# Patient Record
Sex: Female | Born: 1940 | Race: White | Hispanic: No | Marital: Single | Smoking: Former smoker
Health system: Southern US, Community
[De-identification: ages and names within clinical notes are randomized; demographics above are authoritative.]

## PROBLEM LIST (undated history)

## (undated) DIAGNOSIS — E539 Vitamin B deficiency, unspecified: Secondary | ICD-10-CM

## (undated) DIAGNOSIS — M1990 Unspecified osteoarthritis, unspecified site: Secondary | ICD-10-CM

## (undated) DIAGNOSIS — C50919 Malignant neoplasm of unspecified site of unspecified female breast: Secondary | ICD-10-CM

## (undated) DIAGNOSIS — M545 Low back pain, unspecified: Secondary | ICD-10-CM

## (undated) DIAGNOSIS — K5790 Diverticulosis of intestine, part unspecified, without perforation or abscess without bleeding: Secondary | ICD-10-CM

## (undated) DIAGNOSIS — E785 Hyperlipidemia, unspecified: Secondary | ICD-10-CM

## (undated) DIAGNOSIS — F419 Anxiety disorder, unspecified: Secondary | ICD-10-CM

## (undated) DIAGNOSIS — R112 Nausea with vomiting, unspecified: Secondary | ICD-10-CM

## (undated) DIAGNOSIS — IMO0002 Reserved for concepts with insufficient information to code with codable children: Secondary | ICD-10-CM

## (undated) DIAGNOSIS — Z9889 Other specified postprocedural states: Secondary | ICD-10-CM

## (undated) HISTORY — DX: Malignant neoplasm of unspecified site of unspecified female breast: C50.919

## (undated) HISTORY — PX: APPENDECTOMY: SHX54

## (undated) HISTORY — DX: Hyperlipidemia, unspecified: E78.5

## (undated) HISTORY — DX: Unspecified osteoarthritis, unspecified site: M19.90

## (undated) HISTORY — DX: Reserved for concepts with insufficient information to code with codable children: IMO0002

## (undated) HISTORY — DX: Vitamin B deficiency, unspecified: E53.9

## (undated) HISTORY — PX: CERVICAL FUSION: SHX112

## (undated) HISTORY — DX: Anxiety disorder, unspecified: F41.9

## (undated) HISTORY — PX: REDUCTION MAMMAPLASTY: SUR839

## (undated) HISTORY — DX: Low back pain, unspecified: M54.50

## (undated) HISTORY — PX: TOTAL HIP ARTHROPLASTY: SHX124

## (undated) HISTORY — DX: Low back pain: M54.5

## (undated) HISTORY — PX: TONSILLECTOMY: SUR1361

## (undated) HISTORY — PX: JOINT REPLACEMENT: SHX530

## (undated) HISTORY — DX: Diverticulosis of intestine, part unspecified, without perforation or abscess without bleeding: K57.90

## (undated) HISTORY — PX: SPINE SURGERY: SHX786

---

## 1997-12-22 ENCOUNTER — Other Ambulatory Visit: Admission: RE | Admit: 1997-12-22 | Discharge: 1997-12-22 | Payer: Self-pay | Admitting: Gynecology

## 1998-04-10 ENCOUNTER — Other Ambulatory Visit: Admission: RE | Admit: 1998-04-10 | Discharge: 1998-04-10 | Payer: Self-pay | Admitting: Gynecology

## 1998-05-22 ENCOUNTER — Encounter: Payer: Self-pay | Admitting: Neurological Surgery

## 1998-05-24 ENCOUNTER — Ambulatory Visit (HOSPITAL_COMMUNITY): Admission: RE | Admit: 1998-05-24 | Discharge: 1998-05-24 | Payer: Self-pay | Admitting: Neurological Surgery

## 1998-05-24 ENCOUNTER — Encounter: Payer: Self-pay | Admitting: Neurological Surgery

## 1998-05-26 ENCOUNTER — Inpatient Hospital Stay (HOSPITAL_COMMUNITY): Admission: RE | Admit: 1998-05-26 | Discharge: 1998-05-29 | Payer: Self-pay | Admitting: Neurological Surgery

## 1998-05-26 ENCOUNTER — Encounter: Payer: Self-pay | Admitting: Neurological Surgery

## 1998-08-01 HISTORY — PX: BREAST SURGERY: SHX581

## 1998-08-07 ENCOUNTER — Other Ambulatory Visit: Admission: RE | Admit: 1998-08-07 | Discharge: 1998-08-07 | Payer: Self-pay | Admitting: *Deleted

## 1998-08-10 ENCOUNTER — Encounter: Admission: RE | Admit: 1998-08-10 | Discharge: 1998-09-24 | Payer: Self-pay | Admitting: Neurological Surgery

## 1998-09-25 ENCOUNTER — Ambulatory Visit (HOSPITAL_COMMUNITY): Admission: RE | Admit: 1998-09-25 | Discharge: 1998-09-25 | Payer: Self-pay | Admitting: *Deleted

## 1998-12-10 ENCOUNTER — Encounter: Payer: Self-pay | Admitting: Internal Medicine

## 1998-12-10 ENCOUNTER — Ambulatory Visit (HOSPITAL_COMMUNITY): Admission: RE | Admit: 1998-12-10 | Discharge: 1998-12-10 | Payer: Self-pay | Admitting: Internal Medicine

## 1999-06-04 ENCOUNTER — Encounter: Admission: RE | Admit: 1999-06-04 | Discharge: 1999-06-04 | Payer: Self-pay | Admitting: Specialist

## 1999-06-04 ENCOUNTER — Encounter: Payer: Self-pay | Admitting: Specialist

## 1999-06-07 ENCOUNTER — Ambulatory Visit (HOSPITAL_BASED_OUTPATIENT_CLINIC_OR_DEPARTMENT_OTHER): Admission: RE | Admit: 1999-06-07 | Discharge: 1999-06-08 | Payer: Self-pay | Admitting: Specialist

## 1999-10-12 ENCOUNTER — Other Ambulatory Visit: Admission: RE | Admit: 1999-10-12 | Discharge: 1999-10-12 | Payer: Self-pay | Admitting: *Deleted

## 2000-12-11 ENCOUNTER — Other Ambulatory Visit: Admission: RE | Admit: 2000-12-11 | Discharge: 2000-12-11 | Payer: Self-pay | Admitting: *Deleted

## 2001-04-12 ENCOUNTER — Ambulatory Visit (HOSPITAL_COMMUNITY): Admission: RE | Admit: 2001-04-12 | Discharge: 2001-04-12 | Payer: Self-pay | Admitting: Internal Medicine

## 2001-04-12 ENCOUNTER — Encounter: Payer: Self-pay | Admitting: Internal Medicine

## 2001-12-25 ENCOUNTER — Emergency Department (HOSPITAL_COMMUNITY): Admission: EM | Admit: 2001-12-25 | Discharge: 2001-12-26 | Payer: Self-pay | Admitting: Emergency Medicine

## 2002-01-26 ENCOUNTER — Encounter: Admission: RE | Admit: 2002-01-26 | Discharge: 2002-01-26 | Payer: Self-pay | Admitting: Internal Medicine

## 2002-01-26 ENCOUNTER — Encounter: Payer: Self-pay | Admitting: Internal Medicine

## 2002-03-26 ENCOUNTER — Other Ambulatory Visit: Admission: RE | Admit: 2002-03-26 | Discharge: 2002-03-26 | Payer: Self-pay | Admitting: *Deleted

## 2003-04-16 ENCOUNTER — Other Ambulatory Visit: Admission: RE | Admit: 2003-04-16 | Discharge: 2003-04-16 | Payer: Self-pay | Admitting: *Deleted

## 2003-08-02 HISTORY — PX: ABDOMINAL HYSTERECTOMY: SHX81

## 2004-05-20 ENCOUNTER — Encounter (INDEPENDENT_AMBULATORY_CARE_PROVIDER_SITE_OTHER): Payer: Self-pay | Admitting: *Deleted

## 2004-05-20 ENCOUNTER — Observation Stay (HOSPITAL_COMMUNITY): Admission: RE | Admit: 2004-05-20 | Discharge: 2004-05-21 | Payer: Self-pay | Admitting: *Deleted

## 2004-06-17 ENCOUNTER — Ambulatory Visit: Payer: Self-pay | Admitting: Internal Medicine

## 2004-08-04 ENCOUNTER — Ambulatory Visit: Payer: Self-pay | Admitting: Internal Medicine

## 2004-08-09 ENCOUNTER — Ambulatory Visit: Payer: Self-pay | Admitting: Internal Medicine

## 2004-11-02 ENCOUNTER — Ambulatory Visit: Payer: Self-pay | Admitting: Internal Medicine

## 2004-11-08 ENCOUNTER — Ambulatory Visit: Payer: Self-pay | Admitting: Internal Medicine

## 2005-02-07 ENCOUNTER — Ambulatory Visit: Payer: Self-pay | Admitting: Internal Medicine

## 2005-02-14 ENCOUNTER — Ambulatory Visit: Payer: Self-pay | Admitting: Internal Medicine

## 2005-04-18 ENCOUNTER — Ambulatory Visit: Payer: Self-pay | Admitting: Internal Medicine

## 2005-05-27 ENCOUNTER — Ambulatory Visit: Payer: Self-pay | Admitting: Internal Medicine

## 2005-06-02 ENCOUNTER — Encounter: Admission: RE | Admit: 2005-06-02 | Discharge: 2005-06-02 | Payer: Self-pay | Admitting: Orthopedic Surgery

## 2005-07-12 ENCOUNTER — Ambulatory Visit: Payer: Self-pay | Admitting: Internal Medicine

## 2005-08-31 ENCOUNTER — Ambulatory Visit: Payer: Self-pay | Admitting: Internal Medicine

## 2005-09-12 ENCOUNTER — Ambulatory Visit: Payer: Self-pay | Admitting: Internal Medicine

## 2005-09-21 ENCOUNTER — Ambulatory Visit: Payer: Self-pay

## 2005-09-26 ENCOUNTER — Inpatient Hospital Stay (HOSPITAL_COMMUNITY): Admission: RE | Admit: 2005-09-26 | Discharge: 2005-09-29 | Payer: Self-pay | Admitting: Orthopedic Surgery

## 2005-09-27 ENCOUNTER — Ambulatory Visit: Payer: Self-pay | Admitting: Physical Medicine & Rehabilitation

## 2005-09-29 ENCOUNTER — Ambulatory Visit: Payer: Self-pay | Admitting: Physical Medicine & Rehabilitation

## 2005-09-29 ENCOUNTER — Inpatient Hospital Stay
Admission: RE | Admit: 2005-09-29 | Discharge: 2005-10-07 | Payer: Self-pay | Admitting: Physical Medicine & Rehabilitation

## 2005-12-19 ENCOUNTER — Ambulatory Visit: Payer: Self-pay | Admitting: Internal Medicine

## 2006-02-22 ENCOUNTER — Ambulatory Visit: Payer: Self-pay | Admitting: Internal Medicine

## 2006-05-18 ENCOUNTER — Ambulatory Visit: Payer: Self-pay | Admitting: Internal Medicine

## 2006-05-18 LAB — CONVERTED CEMR LAB
Chol/HDL Ratio, serum: 5.8
Cholesterol: 237 mg/dL (ref 0–200)
Creatinine, Ser: 0.7 mg/dL (ref 0.4–1.2)
Glucose, Bld: 99 mg/dL (ref 70–99)
HDL: 41 mg/dL (ref >39.0)
LDL DIRECT: 162.1 mg/dL
Potassium: 3.9 meq/L (ref 3.5–5.1)
Sed Rate: 17 mm/hr (ref 0–25)
T4, Total: 5.3 ug/dL (ref 5.0–12.5)
Triglyceride fasting, serum: 121 mg/dL (ref 0–149)
VLDL: 24 mg/dL (ref 0–40)

## 2006-05-23 ENCOUNTER — Ambulatory Visit: Payer: Self-pay | Admitting: Internal Medicine

## 2006-07-18 ENCOUNTER — Ambulatory Visit: Payer: Self-pay | Admitting: Internal Medicine

## 2006-07-21 ENCOUNTER — Ambulatory Visit: Payer: Self-pay | Admitting: Gastroenterology

## 2006-09-04 ENCOUNTER — Ambulatory Visit: Payer: Self-pay | Admitting: Internal Medicine

## 2006-09-04 LAB — CONVERTED CEMR LAB
Bilirubin Urine: NEGATIVE
Crystals: NEGATIVE
Ketones, ur: NEGATIVE mg/dL
Mucus, UA: NEGATIVE
Nitrite: POSITIVE — AB
Specific Gravity, Urine: 1.025 (ref 1.000–1.03)
Total Protein, Urine: NEGATIVE mg/dL
Urine Glucose: NEGATIVE mg/dL
Urobilinogen, UA: 0.2 (ref 0.0–1.0)
pH: 6 (ref 5.0–8.0)

## 2006-09-22 ENCOUNTER — Ambulatory Visit: Payer: Self-pay | Admitting: Internal Medicine

## 2006-12-27 ENCOUNTER — Ambulatory Visit: Payer: Self-pay | Admitting: Internal Medicine

## 2006-12-28 ENCOUNTER — Ambulatory Visit: Payer: Self-pay | Admitting: Internal Medicine

## 2006-12-28 LAB — CONVERTED CEMR LAB
ALT: 18 units/L (ref 0–40)
AST: 18 units/L (ref 0–37)
Albumin: 4 g/dL (ref 3.5–5.2)
Alkaline Phosphatase: 80 units/L (ref 39–117)
BUN: 14 mg/dL (ref 6–23)
Bilirubin Urine: NEGATIVE
Bilirubin, Direct: 0.1 mg/dL (ref 0.0–0.3)
CA 125: 6.4 units/mL (ref 0.0–30.2)
CO2: 29 meq/L (ref 19–32)
Calcium: 9.2 mg/dL (ref 8.4–10.5)
Chloride: 105 meq/L (ref 96–112)
Creatinine, Ser: 0.7 mg/dL (ref 0.4–1.2)
GFR calc Af Amer: 107 mL/min
GFR calc non Af Amer: 89 mL/min
Glucose, Bld: 109 mg/dL — ABNORMAL HIGH (ref 70–99)
Ketones, ur: NEGATIVE mg/dL
Leukocytes, UA: NEGATIVE
Nitrite: NEGATIVE
Potassium: 3.7 meq/L (ref 3.5–5.1)
Sodium: 139 meq/L (ref 135–145)
Specific Gravity, Urine: 1.02 (ref 1.000–1.03)
Total Bilirubin: 0.7 mg/dL (ref 0.3–1.2)
Total Protein, Urine: NEGATIVE mg/dL
Total Protein: 7 g/dL (ref 6.0–8.3)
Urine Glucose: NEGATIVE mg/dL
Urobilinogen, UA: 0.2 (ref 0.0–1.0)
Vit D, 1,25-Dihydroxy: 21 (ref 20–57)
Vitamin B-12: 179 pg/mL — ABNORMAL LOW (ref 211–911)
pH: 6 (ref 5.0–8.0)

## 2007-01-02 ENCOUNTER — Other Ambulatory Visit: Admission: RE | Admit: 2007-01-02 | Discharge: 2007-01-02 | Payer: Self-pay | Admitting: Gynecology

## 2007-04-13 ENCOUNTER — Other Ambulatory Visit: Admission: RE | Admit: 2007-04-13 | Discharge: 2007-04-13 | Payer: Self-pay | Admitting: Gynecology

## 2007-04-24 ENCOUNTER — Ambulatory Visit: Payer: Self-pay | Admitting: Internal Medicine

## 2007-04-24 LAB — CONVERTED CEMR LAB
ALT: 20 units/L (ref 0–35)
AST: 17 units/L (ref 0–37)
Albumin: 3.8 g/dL (ref 3.5–5.2)
Alkaline Phosphatase: 79 units/L (ref 39–117)
BUN: 10 mg/dL (ref 6–23)
Bilirubin, Direct: 0.1 mg/dL (ref 0.0–0.3)
CO2: 32 meq/L (ref 19–32)
Calcium: 9.5 mg/dL (ref 8.4–10.5)
Chloride: 104 meq/L (ref 96–112)
Cholesterol: 226 mg/dL (ref 0–200)
Creatinine, Ser: 0.6 mg/dL (ref 0.4–1.2)
Direct LDL: 164.4 mg/dL
GFR calc Af Amer: 128 mL/min
GFR calc non Af Amer: 106 mL/min
Glucose, Bld: 102 mg/dL — ABNORMAL HIGH (ref 70–99)
HDL: 50 mg/dL (ref >39.0)
Potassium: 4.1 meq/L (ref 3.5–5.1)
Sodium: 144 meq/L (ref 135–145)
Total Bilirubin: 0.7 mg/dL (ref 0.3–1.2)
Total CHOL/HDL Ratio: 4.5
Total Protein: 7 g/dL (ref 6.0–8.3)
Triglycerides: 103 mg/dL (ref 0–149)
VLDL: 21 mg/dL (ref 0–40)
Vit D, 1,25-Dihydroxy: 17 — ABNORMAL LOW (ref 20–57)
Vitamin B-12: 337 pg/mL (ref 211–911)

## 2007-05-02 ENCOUNTER — Encounter: Payer: Self-pay | Admitting: Internal Medicine

## 2007-05-02 ENCOUNTER — Ambulatory Visit: Payer: Self-pay | Admitting: Internal Medicine

## 2007-05-02 DIAGNOSIS — F411 Generalized anxiety disorder: Secondary | ICD-10-CM | POA: Insufficient documentation

## 2007-05-02 DIAGNOSIS — G8929 Other chronic pain: Secondary | ICD-10-CM | POA: Insufficient documentation

## 2007-05-02 DIAGNOSIS — D518 Other vitamin B12 deficiency anemias: Secondary | ICD-10-CM | POA: Insufficient documentation

## 2007-05-02 DIAGNOSIS — E559 Vitamin D deficiency, unspecified: Secondary | ICD-10-CM | POA: Insufficient documentation

## 2007-05-02 DIAGNOSIS — E785 Hyperlipidemia, unspecified: Secondary | ICD-10-CM | POA: Insufficient documentation

## 2007-05-02 DIAGNOSIS — F419 Anxiety disorder, unspecified: Secondary | ICD-10-CM | POA: Insufficient documentation

## 2007-05-02 DIAGNOSIS — M545 Low back pain, unspecified: Secondary | ICD-10-CM | POA: Insufficient documentation

## 2007-05-02 DIAGNOSIS — M1990 Unspecified osteoarthritis, unspecified site: Secondary | ICD-10-CM | POA: Insufficient documentation

## 2007-05-22 ENCOUNTER — Ambulatory Visit: Payer: Self-pay | Admitting: Internal Medicine

## 2007-06-04 ENCOUNTER — Ambulatory Visit: Payer: Self-pay | Admitting: Internal Medicine

## 2007-06-06 ENCOUNTER — Telehealth: Payer: Self-pay | Admitting: Internal Medicine

## 2007-06-06 DIAGNOSIS — M542 Cervicalgia: Secondary | ICD-10-CM | POA: Insufficient documentation

## 2007-06-08 ENCOUNTER — Encounter (INDEPENDENT_AMBULATORY_CARE_PROVIDER_SITE_OTHER): Payer: Self-pay | Admitting: *Deleted

## 2007-06-25 ENCOUNTER — Telehealth: Payer: Self-pay | Admitting: Internal Medicine

## 2007-08-22 ENCOUNTER — Encounter: Payer: Self-pay | Admitting: Internal Medicine

## 2007-09-19 ENCOUNTER — Ambulatory Visit: Payer: Self-pay | Admitting: Internal Medicine

## 2007-09-19 DIAGNOSIS — J069 Acute upper respiratory infection, unspecified: Secondary | ICD-10-CM | POA: Insufficient documentation

## 2007-09-24 ENCOUNTER — Telehealth: Payer: Self-pay | Admitting: Internal Medicine

## 2007-09-25 ENCOUNTER — Ambulatory Visit: Payer: Self-pay | Admitting: Internal Medicine

## 2007-10-16 ENCOUNTER — Encounter: Admission: RE | Admit: 2007-10-16 | Discharge: 2007-11-06 | Payer: Self-pay | Admitting: Neurology

## 2007-11-23 ENCOUNTER — Encounter: Payer: Self-pay | Admitting: Internal Medicine

## 2008-01-30 ENCOUNTER — Encounter: Payer: Self-pay | Admitting: Internal Medicine

## 2008-02-22 ENCOUNTER — Telehealth: Payer: Self-pay | Admitting: Internal Medicine

## 2008-04-04 ENCOUNTER — Encounter: Payer: Self-pay | Admitting: Internal Medicine

## 2008-05-01 ENCOUNTER — Inpatient Hospital Stay (HOSPITAL_COMMUNITY): Admission: RE | Admit: 2008-05-01 | Discharge: 2008-05-02 | Payer: Self-pay | Admitting: Neurological Surgery

## 2008-05-04 ENCOUNTER — Inpatient Hospital Stay (HOSPITAL_COMMUNITY): Admission: EM | Admit: 2008-05-04 | Discharge: 2008-05-08 | Payer: Self-pay | Admitting: Emergency Medicine

## 2008-05-28 ENCOUNTER — Encounter: Payer: Self-pay | Admitting: Internal Medicine

## 2008-08-12 ENCOUNTER — Ambulatory Visit: Payer: Self-pay | Admitting: Internal Medicine

## 2008-08-12 DIAGNOSIS — R109 Unspecified abdominal pain: Secondary | ICD-10-CM | POA: Insufficient documentation

## 2008-08-15 ENCOUNTER — Ambulatory Visit: Payer: Self-pay | Admitting: Gynecology

## 2008-09-26 ENCOUNTER — Ambulatory Visit: Payer: Self-pay | Admitting: Internal Medicine

## 2008-09-28 LAB — CONVERTED CEMR LAB
BUN: 16 mg/dL (ref 6–23)
CO2: 27 meq/L (ref 19–32)
Calcium: 9.3 mg/dL (ref 8.4–10.5)
Chloride: 103 meq/L (ref 96–112)
Cholesterol: 232 mg/dL (ref 0–200)
Creatinine, Ser: 0.6 mg/dL (ref 0.4–1.2)
Direct LDL: 161.5 mg/dL
GFR calc Af Amer: 128 mL/min
GFR calc non Af Amer: 106 mL/min
Glucose, Bld: 116 mg/dL — ABNORMAL HIGH (ref 70–99)
HDL: 45.7 mg/dL (ref >39.0)
Potassium: 4.1 meq/L (ref 3.5–5.1)
Sodium: 138 meq/L (ref 135–145)
TSH: 3.33 microintl units/mL (ref 0.35–5.50)
Total CHOL/HDL Ratio: 5.1
Triglycerides: 110 mg/dL (ref 0–149)
VLDL: 22 mg/dL (ref 0–40)
Vitamin B-12: >1500 pg/mL — ABNORMAL HIGH (ref 211–911)

## 2008-09-30 LAB — CONVERTED CEMR LAB: Vit D, 25-Hydroxy: 26 ng/mL — ABNORMAL LOW (ref 30–89)

## 2008-10-08 ENCOUNTER — Ambulatory Visit: Payer: Self-pay | Admitting: Internal Medicine

## 2008-12-04 ENCOUNTER — Telehealth: Payer: Self-pay | Admitting: Internal Medicine

## 2009-01-29 ENCOUNTER — Ambulatory Visit: Payer: Self-pay | Admitting: Internal Medicine

## 2009-01-29 LAB — CONVERTED CEMR LAB
BUN: 13 mg/dL (ref 6–23)
CO2: 30 meq/L (ref 19–32)
Calcium: 9.3 mg/dL (ref 8.4–10.5)
Chloride: 104 meq/L (ref 96–112)
Creatinine, Ser: 0.6 mg/dL (ref 0.4–1.2)
GFR calc non Af Amer: 105.27 mL/min (ref >60)
Glucose, Bld: 104 mg/dL — ABNORMAL HIGH (ref 70–99)
Potassium: 4.2 meq/L (ref 3.5–5.1)
Sodium: 141 meq/L (ref 135–145)

## 2009-02-02 LAB — CONVERTED CEMR LAB: Vit D, 25-Hydroxy: 24 ng/mL — ABNORMAL LOW (ref 30–89)

## 2009-02-04 ENCOUNTER — Ambulatory Visit: Payer: Self-pay | Admitting: Internal Medicine

## 2009-04-08 ENCOUNTER — Ambulatory Visit: Payer: Self-pay | Admitting: Gynecology

## 2009-04-08 ENCOUNTER — Other Ambulatory Visit: Admission: RE | Admit: 2009-04-08 | Discharge: 2009-04-08 | Payer: Self-pay | Admitting: Gynecology

## 2009-04-08 ENCOUNTER — Encounter: Payer: Self-pay | Admitting: Gynecology

## 2009-04-09 ENCOUNTER — Ambulatory Visit: Payer: Self-pay | Admitting: Gynecology

## 2009-04-17 ENCOUNTER — Telehealth (INDEPENDENT_AMBULATORY_CARE_PROVIDER_SITE_OTHER): Payer: Self-pay | Admitting: *Deleted

## 2009-06-05 ENCOUNTER — Ambulatory Visit: Payer: Self-pay | Admitting: Internal Medicine

## 2009-06-05 LAB — CONVERTED CEMR LAB
BUN: 10 mg/dL (ref 6–23)
CO2: 29 meq/L (ref 19–32)
Calcium: 8.9 mg/dL (ref 8.4–10.5)
Chloride: 104 meq/L (ref 96–112)
Creatinine, Ser: 0.7 mg/dL (ref 0.4–1.2)
GFR calc non Af Amer: 88.03 mL/min (ref >60)
Glucose, Bld: 99 mg/dL (ref 70–99)
Potassium: 3.9 meq/L (ref 3.5–5.1)
Sodium: 141 meq/L (ref 135–145)
TSH: 3.48 microintl units/mL (ref 0.35–5.50)
Vitamin B-12: 765 pg/mL (ref 211–911)

## 2009-06-08 ENCOUNTER — Telehealth: Payer: Self-pay | Admitting: Internal Medicine

## 2009-06-08 LAB — CONVERTED CEMR LAB: Vit D, 25-Hydroxy: 15 ng/mL — ABNORMAL LOW (ref 30–89)

## 2009-06-12 ENCOUNTER — Ambulatory Visit: Payer: Self-pay | Admitting: Internal Medicine

## 2009-06-12 DIAGNOSIS — Z87891 Personal history of nicotine dependence: Secondary | ICD-10-CM | POA: Insufficient documentation

## 2009-06-30 ENCOUNTER — Ambulatory Visit: Payer: Self-pay | Admitting: Cardiovascular Disease

## 2009-06-30 ENCOUNTER — Ambulatory Visit (HOSPITAL_COMMUNITY): Admission: RE | Admit: 2009-06-30 | Discharge: 2009-06-30 | Payer: Self-pay | Admitting: Internal Medicine

## 2009-06-30 ENCOUNTER — Encounter: Payer: Self-pay | Admitting: Internal Medicine

## 2009-06-30 ENCOUNTER — Ambulatory Visit: Payer: Self-pay

## 2009-07-20 ENCOUNTER — Ambulatory Visit: Payer: Self-pay | Admitting: Internal Medicine

## 2009-07-20 DIAGNOSIS — J019 Acute sinusitis, unspecified: Secondary | ICD-10-CM | POA: Insufficient documentation

## 2009-08-26 ENCOUNTER — Inpatient Hospital Stay (HOSPITAL_COMMUNITY): Admission: RE | Admit: 2009-08-26 | Discharge: 2009-08-29 | Payer: Self-pay | Admitting: Orthopedic Surgery

## 2009-12-08 ENCOUNTER — Ambulatory Visit: Payer: Self-pay | Admitting: Internal Medicine

## 2009-12-08 DIAGNOSIS — L659 Nonscarring hair loss, unspecified: Secondary | ICD-10-CM | POA: Insufficient documentation

## 2009-12-08 LAB — CONVERTED CEMR LAB
ALT: 26 units/L (ref 0–35)
AST: 23 units/L (ref 0–37)
Albumin: 4.2 g/dL (ref 3.5–5.2)
Alkaline Phosphatase: 82 units/L (ref 39–117)
BUN: 15 mg/dL (ref 6–23)
Basophils Absolute: 0.1 10*3/uL (ref 0.0–0.1)
Basophils Relative: 0.9 % (ref 0.0–3.0)
Bilirubin Urine: NEGATIVE
Bilirubin, Direct: 0.1 mg/dL (ref 0.0–0.3)
CO2: 29 meq/L (ref 19–32)
Calcium: 9.4 mg/dL (ref 8.4–10.5)
Chloride: 104 meq/L (ref 96–112)
Creatinine, Ser: 0.5 mg/dL (ref 0.4–1.2)
Eosinophils Absolute: 0.1 10*3/uL (ref 0.0–0.7)
Eosinophils Relative: 1 % (ref 0.0–5.0)
GFR calc non Af Amer: 121.17 mL/min (ref >60)
Glucose, Bld: 93 mg/dL (ref 70–99)
HCT: 41.8 % (ref 36.0–46.0)
Hemoglobin, Urine: NEGATIVE
Hemoglobin: 14.4 g/dL (ref 12.0–15.0)
Ketones, ur: NEGATIVE mg/dL
Leukocytes, UA: NEGATIVE
Lymphocytes Relative: 34 % (ref 12.0–46.0)
Lymphs Abs: 2.8 10*3/uL (ref 0.7–4.0)
MCHC: 34.4 g/dL (ref 30.0–36.0)
MCV: 93.8 fL (ref 78.0–100.0)
Monocytes Absolute: 0.7 10*3/uL (ref 0.1–1.0)
Monocytes Relative: 8.6 % (ref 3.0–12.0)
Neutro Abs: 4.5 10*3/uL (ref 1.4–7.7)
Neutrophils Relative %: 55.5 % (ref 43.0–77.0)
Nitrite: NEGATIVE
Platelets: 328 10*3/uL (ref 150.0–400.0)
Potassium: 4.5 meq/L (ref 3.5–5.1)
RBC: 4.45 M/uL (ref 3.87–5.11)
RDW: 13.4 % (ref 11.5–14.6)
Sed Rate: 17 mm/hr (ref 0–22)
Sodium: 141 meq/L (ref 135–145)
Specific Gravity, Urine: 1.025 (ref 1.000–1.030)
TSH: 2.55 microintl units/mL (ref 0.35–5.50)
Total Bilirubin: 0.7 mg/dL (ref 0.3–1.2)
Total Protein, Urine: NEGATIVE mg/dL
Total Protein: 7.2 g/dL (ref 6.0–8.3)
Urine Glucose: NEGATIVE mg/dL
Urobilinogen, UA: 0.2 (ref 0.0–1.0)
Vitamin B-12: 1007 pg/mL — ABNORMAL HIGH (ref 211–911)
WBC: 8.2 10*3/uL (ref 4.5–10.5)
pH: 5.5 (ref 5.0–8.0)

## 2009-12-09 LAB — CONVERTED CEMR LAB: Vit D, 25-Hydroxy: 26 ng/mL — ABNORMAL LOW (ref 30–89)

## 2010-03-15 ENCOUNTER — Ambulatory Visit: Payer: Self-pay | Admitting: Internal Medicine

## 2010-03-15 DIAGNOSIS — M25559 Pain in unspecified hip: Secondary | ICD-10-CM | POA: Insufficient documentation

## 2010-03-15 DIAGNOSIS — E669 Obesity, unspecified: Secondary | ICD-10-CM | POA: Insufficient documentation

## 2010-04-09 ENCOUNTER — Encounter: Payer: Self-pay | Admitting: Internal Medicine

## 2010-04-14 ENCOUNTER — Other Ambulatory Visit: Admission: RE | Admit: 2010-04-14 | Discharge: 2010-04-14 | Payer: Self-pay | Admitting: Gynecology

## 2010-04-14 ENCOUNTER — Ambulatory Visit: Payer: Self-pay | Admitting: Gynecology

## 2010-04-19 ENCOUNTER — Encounter: Payer: Self-pay | Admitting: Internal Medicine

## 2010-06-02 ENCOUNTER — Ambulatory Visit (HOSPITAL_COMMUNITY): Admission: RE | Admit: 2010-06-02 | Discharge: 2010-06-02 | Payer: Self-pay | Admitting: Orthopedic Surgery

## 2010-06-21 ENCOUNTER — Encounter: Payer: Self-pay | Admitting: Internal Medicine

## 2010-06-21 ENCOUNTER — Ambulatory Visit: Payer: Self-pay | Admitting: Internal Medicine

## 2010-06-21 LAB — CONVERTED CEMR LAB: Vit D, 25-Hydroxy: 38 ng/mL (ref 30–89)

## 2010-06-22 LAB — CONVERTED CEMR LAB
ALT: 19 units/L (ref 0–35)
AST: 18 units/L (ref 0–37)
Albumin: 4.2 g/dL (ref 3.5–5.2)
Alkaline Phosphatase: 78 units/L (ref 39–117)
BUN: 12 mg/dL (ref 6–23)
Basophils Absolute: 0.1 10*3/uL (ref 0.0–0.1)
Basophils Relative: 1.1 % (ref 0.0–3.0)
Bilirubin Urine: NEGATIVE
Bilirubin, Direct: 0.1 mg/dL (ref 0.0–0.3)
CO2: 27 meq/L (ref 19–32)
Calcium: 9.2 mg/dL (ref 8.4–10.5)
Chloride: 103 meq/L (ref 96–112)
Cholesterol: 240 mg/dL — ABNORMAL HIGH (ref 0–200)
Creatinine, Ser: 0.6 mg/dL (ref 0.4–1.2)
Direct LDL: 163.7 mg/dL
Eosinophils Absolute: 0.1 10*3/uL (ref 0.0–0.7)
Eosinophils Relative: 1.2 % (ref 0.0–5.0)
GFR calc non Af Amer: 97.32 mL/min (ref >60)
Glucose, Bld: 99 mg/dL (ref 70–99)
HCT: 41.5 % (ref 36.0–46.0)
HDL: 53 mg/dL (ref >39.00)
Hemoglobin, Urine: NEGATIVE
Hemoglobin: 14.4 g/dL (ref 12.0–15.0)
Ketones, ur: NEGATIVE mg/dL
Leukocytes, UA: NEGATIVE
Lymphocytes Relative: 22.8 % (ref 12.0–46.0)
Lymphs Abs: 1.6 10*3/uL (ref 0.7–4.0)
MCHC: 34.9 g/dL (ref 30.0–36.0)
MCV: 96.1 fL (ref 78.0–100.0)
Monocytes Absolute: 0.6 10*3/uL (ref 0.1–1.0)
Monocytes Relative: 8.4 % (ref 3.0–12.0)
Neutro Abs: 4.6 10*3/uL (ref 1.4–7.7)
Neutrophils Relative %: 66.5 % (ref 43.0–77.0)
Nitrite: NEGATIVE
Platelets: 301 10*3/uL (ref 150.0–400.0)
Potassium: 4.2 meq/L (ref 3.5–5.1)
RBC: 4.31 M/uL (ref 3.87–5.11)
RDW: 13.5 % (ref 11.5–14.6)
Sodium: 138 meq/L (ref 135–145)
Specific Gravity, Urine: 1.025 (ref 1.000–1.030)
TSH: 4.37 microintl units/mL (ref 0.35–5.50)
Total Bilirubin: 0.5 mg/dL (ref 0.3–1.2)
Total CHOL/HDL Ratio: 5
Total Protein, Urine: NEGATIVE mg/dL
Total Protein: 7 g/dL (ref 6.0–8.3)
Triglycerides: 118 mg/dL (ref 0.0–149.0)
Urine Glucose: NEGATIVE mg/dL
Urobilinogen, UA: 0.2 (ref 0.0–1.0)
VLDL: 23.6 mg/dL (ref 0.0–40.0)
WBC: 7 10*3/uL (ref 4.5–10.5)
pH: 5.5 (ref 5.0–8.0)

## 2010-08-01 HISTORY — PX: EYE SURGERY: SHX253

## 2010-08-22 ENCOUNTER — Encounter: Payer: Self-pay | Admitting: Orthopedic Surgery

## 2010-08-23 ENCOUNTER — Encounter: Payer: Self-pay | Admitting: Internal Medicine

## 2010-08-23 ENCOUNTER — Ambulatory Visit
Admission: RE | Admit: 2010-08-23 | Discharge: 2010-08-23 | Payer: Self-pay | Source: Home / Self Care | Attending: Internal Medicine | Admitting: Internal Medicine

## 2010-08-25 ENCOUNTER — Telehealth (INDEPENDENT_AMBULATORY_CARE_PROVIDER_SITE_OTHER): Payer: Self-pay | Admitting: *Deleted

## 2010-08-25 ENCOUNTER — Telehealth: Payer: Self-pay | Admitting: Internal Medicine

## 2010-08-31 NOTE — Letter (Signed)
Summary: microdiscectomy/[facility]  microdiscectomy/[facility]   Imported By: Lester [place] 06/17/2008 09:09:37  _____________________________________________________________________  External Attachment:    Type:   Image     Comment:   External Document

## 2010-08-31 NOTE — Assessment & Plan Note (Signed)
Summary: 3 MOS F/U // # / CD   Vital Signs:  Patient profile:   70 year old female Height:      64 inches Weight:      196 pounds BMI:     33.76 Temp:     96.9 degrees F oral Pulse rate:   80 / minute Pulse rhythm:   regular Resp:     16 per minute BP sitting:   100 / 60  (left arm) Cuff size:   large  Vitals Entered By: Lanier Prude, CMA(AAMA) (March 15, 2010 1:16 PM) CC: 3 mo f/u Is Patient Diabetic? No Comments pt is not taking Celebrex.   CC:  3 mo f/u.  History of Present Illness: C/o R hip pain after a fall OOB in [place] - went to see Dr Despina Hick Teeth surgery x2 recentely - taking the antibiotic x 2 wks C/o LBP C/o stress  Current Medications (verified): 1)  Zolpidem Tartrate 10 Mg  Tabs (Zolpidem Tartrate) .... 1/2 or 1 By Mouth At [facility] Prn 2)  Vitamin B-12 1000 Mcg  Tabs (Cyanocobalamin) .Marland Kitchen.. 1 Qd 3)  Vitamin D3 1000 Unit  Tabs (Cholecalciferol) .... 2 Qd 4)  Valium 5 Mg Tabs (Diazepam) .... Once Daily As Needed 5)  Celebrex 200 Mg Caps (Celecoxib) .... As Needed 6)  Alprazolam 0.25 Mg Tabs (Alprazolam) .Marland Kitchen.. 1 By Mouth Two Times A Day As Needed Anxiety For Flights Only 7)  Fish Oil 1000 Mg Caps (Omega-3 Fatty Acids) .... Two Times A Day 8)  Premarin 1.0gr .... 2 Times A Week 9)  Glucosamine Sulfate 750 Mg Caps (Glucosamine Sulfate) .... Once Daily 10)  Aleve 220 Mg Tabs (Naproxen Sodium) .... As Needed For Pain 11)  Caltrate 600 1500 Mg Tabs (Calcium Carbonate) .Marland Kitchen.. 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  The patient denies weight loss and weight gain.    Physical Exam  General:  overweight-appearing.   Mouth:  Oral mucosa and oropharynx without lesions or exudates.  Teeth in good repair. Neck:  No deformities, masses, or tenderness noted. Lungs:  Normal respiratory effort, chest expands symmetrically. Lungs are clear to auscultation, no crackles or wheezes. Heart:  Normal rate and regular rhythm. S1 and S2 normal without gallop, murmur, click, rub or other extra sounds. Abdomen:  Bowel sounds positive,abdomen soft and non-tender without masses, organomegaly or hernias noted. Msk:  Lumbar-sacral spine is tender to palpation over paraspinal muscles and painfull with the ROM  R hip is tender Walking w/o a cane Extremities:  No clubbing, cyanosis, edema, or deformity noted with normal full range of motion of all joints.   Neurologic:  No cranial nerve deficits noted. Station and gait are normal. Plantar reflexes are down-going bilaterally. DTRs are symmetrical throughout. Sensory, motor and coordinative functions appear intact. Skin:  Intact without suspicious lesions or rashes Very mild alopecia on top of  the scalp Psych:  Oriented X3.     Impression & Recommendations:  Problem # 1:  LOW BACK PAIN (ICD-724.2) Assessment Unchanged  Her updated medication list for this problem includes:    Celebrex 200 Mg Caps (Celecoxib) .Marland Kitchen... As needed    Aleve 220 Mg Tabs (Naproxen sodium) .Marland Kitchen... As needed for pain  Problem # 2:  OSTEOARTHRITIS (ICD-715.90) Assessment: Unchanged  Her updated medication list for this problem includes:    Celebrex 200 Mg Caps (Celecoxib) .Marland Kitchen... As needed    Aleve 220 Mg Tabs (Naproxen sodium)  .Marland Kitchen... As needed for pain  Problem # 3:  HIP PAIN (ICD-719.45) Assessment: Deteriorated See "Patient Instructions".  Her updated medication list for this problem includes:    Celebrex 200 Mg Caps (Celecoxib) .Marland Kitchen... As needed    Aleve 220 Mg Tabs (Naproxen sodium) .Marland Kitchen... As needed for pain  Problem # 4:  MEMORY LOSS (ICD-780.93) Assessment: Improved Discussed  Problem # 5:  OBESITY (ICD-278.00) Assessment: Unchanged See "Patient Instructions".  Discussed: She can try Phentermine. Risks vs benefits and controversies of a long term appetite supressant use were discussed.   Problem # 6:  ALOPECIA (ICD-704.00) Assessment: Improved  S/p Derm consult  Problem # 7:  INGUINAL PAIN, RIGHT (ICD-789.09) Assessment: Unchanged See "Patient Instructions".  Her updated medication list for this problem includes:    Celebrex 200 Mg Caps (Celecoxib) .Marland Kitchen... As needed    Aleve 220 Mg Tabs (Naproxen sodium) .Marland Kitchen... As needed for pain  Problem # 8:  ANXIETY (ICD-300.00) Assessment: Unchanged  Her updated medication list for this problem includes:    Valium 5 Mg Tabs (Diazepam) .Marland Kitchen... Take bid as needed    Alprazolam 0.25 Mg Tabs (Alprazolam) .Marland Kitchen... 1 by mouth two times a day as needed anxiety for flights only  Complete Medication List: 1)  Zolpidem Tartrate 10 Mg Tabs (Zolpidem tartrate) .... 1/2 or 1 by mouth at hs prn 2)  Vitamin B-12 1000 Mcg Tabs (Cyanocobalamin) .Marland Kitchen.. 1 qd 3)  Vitamin D3 1000 Unit Tabs (Cholecalciferol) .... 2 qd 4)  Valium 5 Mg Tabs (Diazepam) .... Take bid as needed 5)  Celebrex 200 Mg Caps (Celecoxib) .... As needed 6)  Alprazolam 0.25 Mg Tabs (Alprazolam) .Marland Kitchen.. 1 by mouth two times a day as needed anxiety for flights only 7)  Fish Oil 1000 Mg Caps (Omega-3 fatty acids) .... Two times a day 8)  Premarin 1.0gr  .... 2 times a week 9)  Glucosamine Sulfate 750 Mg Caps (Glucosamine sulfate) .... Once daily 10)  Aleve 220 Mg Tabs (Naproxen sodium) .... As needed for pain 11)   Caltrate 600 1500 Mg Tabs (Calcium carbonate) .Marland Kitchen.. 1 once daily 12)  Phentermine Hcl 37.5 Mg Tabs (Phentermine hcl) .Marland Kitchen.. 1 by mouth qam  Patient Instructions: 1)  Please schedule a follow-up appointment in 3 months well w/labs. 2)  Go on Youtube ([email or web address].com) and look up "piriformis stretch", "Ileopsoas stretch", "IT band stretch" and "gluteus stretch". See the anatomy and learn the symptoms.  You can try to self-diagnose. Do the stretches - it may help!  3)  Use stretching and balance exercises that I have provided (15 min. or longer every day) 4)  Try to eat more raw plant food, fresh and dry fruit, raw almonds, leafy vegetables, whole foods and less red meat, less animal fat. Poultry and fish is better for you than pork and beef. Avoid processed foods (canned soups, hot dogs, sausage, bacon , frozen dinners). Avoid corn syrup, high fructose syrup or  aspartam  containing drinks. Honey, Agave and Stevia are better sweeteners. Make your own  dressing with olive oil, wine vinegar, lemon juce, garlic etc. for your salads. Prescriptions: PHENTERMINE HCL 37.5 MG TABS (PHENTERMINE HCL) 1 by mouth qam  #30 x 2   Entered and Authorized by:   Tresa Garter MD   Signed by:   Tresa Garter MD on 03/15/2010   Method used:   Print then Give to Patient   RxID:   [id number] VALIUM 5 MG TABS (DIAZEPAM) Take bid as needed  #60 x 6   Entered and Authorized by:   Tresa Garter MD   Signed by:   Tresa Garter MD on 03/15/2010   Method used:   Print then Give to Patient   RxID:   [id number]

## 2010-08-31 NOTE — Progress Notes (Signed)
Summary: diazepam  Phone Note Other Incoming Call back at fax   [phone] Summary of Call: refill request on diazepam--ok x 3 refills/vg Initial call taken by: Tora Perches,  June 08, 2009 1:45 PM    Prescriptions: VALIUM 5 MG TABS (DIAZEPAM) once daily as needed  #30 x 3   Entered by:   Tora Perches   Authorized by:   Tresa Garter MD   Signed by:   Tora Perches on 06/08/2009   Method used:   Telephoned to ...       [facility]  [phone]* (retail)       [address] [place], [place]  [address]       Ph: [phone] or [phone]       Fax: [phone]   RxID:   [id number]

## 2010-08-31 NOTE — Progress Notes (Signed)
Summary: req for meds  Phone Note Call from Patient Call back at Home Phone [phone]   Reason for Call: Privacy/Consent Authorization Summary of Call: Patient is requesting antibiotics for diverticulitis. She has been eating nuts over the last week and she has been having pain on the lower left of her back. She has some left over antibiotics, augumentin & flagyl that she has started but does not have enough. She is requesting a new rx. Pt is leaving to go out of town tomorrow. Initial call taken by: Lamar Sprinkles,  June 25, 2007 11:33 AM  Follow-up for Phone Call        OK Follow-up by: Tresa Garter MD,  June 25, 2007 1:22 PM  Additional Follow-up for Phone Call Additional follow up Details #1::        Pt informed, rx sent in  Additional Follow-up by: Lamar Sprinkles,  June 26, 2007 8:06 AM    New/Updated Medications: AUGMENTIN 875-125 MG TABS (AMOXICILLIN-POT CLAVULANATE) 1 by mouth tid METRONIDAZOLE 500 MG  TABS (METRONIDAZOLE) 1 by mouth tid   Prescriptions: METRONIDAZOLE 500 MG  TABS (METRONIDAZOLE) 1 by mouth tid  #21 x 0   Entered by:   Lamar Sprinkles   Authorized by:   Tresa Garter MD   Signed by:   Lamar Sprinkles on 06/26/2007   Method used:   Electronically sent to ...       [facility]  [phone]*       [address]       [place], [place]  [address]       Ph: [phone] or [phone]       Fax: [phone]   RxID:   [id number] AUGMENTIN 875-125 MG TABS (AMOXICILLIN-POT CLAVULANATE) 1 by mouth tid  #30 x 0   Entered by:   Lamar Sprinkles   Authorized by:   Tresa Garter MD   Signed by:   Lamar Sprinkles on 06/26/2007   Method used:   Electronically sent to ...       [facility]  [phone]*       [address]       [place], [place]  [address]       Ph: [phone] or [phone]       Fax: [phone]   RxID:   [id number]

## 2010-08-31 NOTE — Progress Notes (Signed)
Summary: Plot pt  Phone Note Call from Patient Call back at [facility] Phone [phone]   Summary of Call: Pt is scheduled for cortisone injection in neck. Noticed yesterday a swollen tender gland in neck. No fever or congestion, slight sore throat.  Nurse at [facility] told pt that if she was on antibiotics for 5 days it would be ok for pt to have injection tuesday. Patient is requesting rx for ammoxicillin. Had 2 ammoxicillin at home, took 1 yesterday & 1 today.  Initial call taken by: Lamar Sprinkles,  February 22, 2008 11:09 AM  Follow-up for Phone Call        OK for amoxicillin 875 mg two times a day x 4 days = #8 Follow-up by: Jacques Navy MD,  February 22, 2008 11:38 AM  Additional Follow-up for Phone Call Additional follow up Details #1::        Lf mess for pt Additional Follow-up by: Lamar Sprinkles,  February 22, 2008 11:51 AM    New/Updated Medications: AMOXICILLIN 875 MG  TABS (AMOXICILLIN) 1 two times a day x 4 days   Prescriptions: AMOXICILLIN 875 MG  TABS (AMOXICILLIN) 1 two times a day x 4 days  #8 x 0   Entered by:   Lamar Sprinkles   Authorized by:   Tresa Garter MD   Signed by:   Lamar Sprinkles on 02/22/2008   Method used:   Electronically sent to ...       [facility]  [phone]*       [address]       [place], [place]  [address]       Ph: [phone] or [phone]       Fax: [phone]   RxID:   [id number]

## 2010-08-31 NOTE — Consult Note (Signed)
Summary: Consultation Report/Neuro/[facility]  Consultation Report/Neuro/[facility]   Imported By: Lester [place] 02/19/2008 10:26:45  _____________________________________________________________________  External Attachment:    Type:   Image     Comment:   External Document

## 2010-08-31 NOTE — Assessment & Plan Note (Signed)
Summary: f/u appt / pt chose may/cd   Vital Signs:  Patient profile:   70 year old female Height:      64 inches Weight:      200 pounds BMI:     34.45 O2 Sat:      97 % on Room air Temp:     97.3 degrees F oral Pulse rate:   73 / minute Pulse rhythm:   regular BP sitting:   110 / 70  (left arm) Cuff size:   large  Vitals Entered By: Rock Nephew CMA (Dec 08, 2009 1:09 PM)  O2 Flow:  Room air CC: follow-up visit Is Patient Diabetic? No   CC:  follow-up visit.  History of Present Illness: C/o hair loss x 1 wk F/u LBP, anxiety, OA  Current Medications (verified): 1)  Zolpidem Tartrate 10 Mg  Tabs (Zolpidem Tartrate) .... 1/2 or 1 By Mouth At [facility] Prn 2)  Vitamin B-12 1000 Mcg  Tabs (Cyanocobalamin) .Marland Kitchen.. 1 Qd 3)  Vitamin D3 1000 Unit  Tabs (Cholecalciferol) .... 2 Qd 4)  Valium 5 Mg Tabs (Diazepam) .... Once Daily As Needed 5)  Celebrex 200 Mg Caps (Celecoxib) .... As Needed 6)  Alprazolam 0.25 Mg Tabs (Alprazolam) .Marland Kitchen.. 1 By Mouth Two Times A Day As Needed Anxiety For Flights Only 7)  Fish Oil 1000 Mg Caps (Omega-3 Fatty Acids) .... Two Times A Day 8)  Premarin 1.0gr .... 2 Times A Week 9)  Glucosamine Sulfate 750 Mg Caps (Glucosamine Sulfate) .... 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    Physical Exam  General:  overweight-appearing.   Ears:  External ear exam shows no  significant lesions or deformities.  Otoscopic examination reveals clear canals, tympanic membranes are intact bilaterally without bulging, retraction, inflammation or discharge. Hearing is grossly normal bilaterally. Nose:  External nasal examination shows no deformity or inflammation. Nasal mucosa are pink and moist without lesions or exudates. Mouth:  Oral mucosa and oropharynx without lesions or exudates.  Teeth in good repair. Lungs:  Normal respiratory effort, chest expands symmetrically. Lungs are clear to auscultation, no crackles or wheezes. Heart:  Normal rate and regular rhythm. S1 and S2 normal without gallop, murmur, click, rub or other extra sounds. Abdomen:  Bowel sounds positive,abdomen soft and non-tender without masses, organomegaly or hernias noted. Msk:  Lumbar-sacral spine is tender to palpation over paraspinal muscles and painfull with the ROM  R hip is better Walking w/a cane Extremities:  No clubbing, cyanosis, edema, or deformity noted with normal full range of motion of all joints.   Neurologic:  No cranial nerve deficits noted. Station and gait are normal. Plantar reflexes are down-going bilaterally. DTRs are symmetrical throughout. Sensory, motor and coordinative functions appear intact. Skin:  Intact without suspicious lesions or rashes Very mild alopecia on top of the scalp Psych:  Oriented X3.     Impression & Recommendations:  Problem # 1:  ALOPECIA (ICD-704.00) mild Assessment New  Orders: TLB-B12, Serum-Total ONLY (  01027-O53) TLB-BMP (Basic Metabolic Panel-BMET) (80048-METABOL) TLB-Hepatic/Liver Function Pnl (80076-HEPATIC) TLB-TSH (Thyroid Stimulating Hormone) (84443-TSH) TLB-Sedimentation Rate (ESR) (85652-ESR) T-Vitamin D (25-Hydroxy) (66440-34742) TLB-Udip ONLY (81003-UDIP) TLB-CBC Platelet - w/Differential (85025-CBCD)  Problem # 2:  LOW BACK PAIN (ICD-724.2) Assessment: Unchanged  Her updated medication list for this problem includes:     Celebrex 200 Mg Caps (Celecoxib) .Marland Kitchen... As needed  Orders: T-Vitamin D (25-Hydroxy) [phone]) TLB-B12, Serum-Total ONLY (33295-J88) TLB-BMP (Basic Metabolic Panel-BMET) (80048-METABOL) TLB-Hepatic/Liver Function Pnl (80076-HEPATIC) TLB-TSH (Thyroid Stimulating Hormone) (84443-TSH) TLB-Sedimentation Rate (ESR) (85652-ESR) TLB-Udip ONLY (81003-UDIP) TLB-CBC Platelet - w/Differential (85025-CBCD)  Problem # 3:  OSTEOARTHRITIS (ICD-715.90) Assessment: Unchanged  Her updated medication list for this problem includes:    Celebrex 200 Mg Caps (Celecoxib) .Marland Kitchen... As needed  Orders: T-Vitamin D (25-Hydroxy) [id number]) TLB-B12, Serum-Total ONLY (01093-A35) TLB-BMP (Basic Metabolic Panel-BMET) (80048-METABOL) TLB-Hepatic/Liver Function Pnl (80076-HEPATIC) TLB-TSH (Thyroid Stimulating Hormone) (84443-TSH) TLB-Sedimentation Rate (ESR) (85652-ESR) TLB-Udip ONLY (81003-UDIP) TLB-CBC Platelet - w/Differential (85025-CBCD)  Problem # 4:  ANEMIA, VITAMIN B12 DEFICIENCY NEC (ICD-281.1) Assessment: Unchanged  Her updated medication list for this problem includes:    Vitamin B-12 1000 Mcg Tabs (Cyanocobalamin) .Marland Kitchen... 1 qd  Problem # 5:  S/p R THR  Complete Medication List: 1)  Zolpidem Tartrate 10 Mg Tabs (Zolpidem tartrate) .... 1/2 or 1 by mouth at hs prn 2)  Vitamin B-12 1000 Mcg Tabs (Cyanocobalamin) .Marland Kitchen.. 1 qd 3)  Vitamin D3 1000 Unit Tabs (Cholecalciferol) .... 2 qd 4)  Valium 5 Mg Tabs (Diazepam) .... Once daily as needed 5)  Celebrex 200 Mg Caps (Celecoxib) .... As needed 6)  Alprazolam 0.25 Mg Tabs (Alprazolam) .Marland Kitchen.. 1 by mouth two times a day as needed anxiety for flights only 7)  Fish Oil 1000 Mg Caps (Omega-3 fatty acids) .... Two times a day 8)  Premarin 1.0gr  .... 2 times a week 9)  Glucosamine Sulfate 750 Mg Caps (Glucosamine sulfate) .... Once daily  Patient Instructions: 1)  Please schedule a follow-up appointment in 3 months. Prescriptions: VALIUM 5 MG TABS (DIAZEPAM)  once daily as needed  #30 x 3   Entered and Authorized by:   Tresa Garter MD   Signed by:   Tresa Garter MD on 12/08/2009   Method used:   Print then Give to Patient   RxID:   [id number]

## 2010-08-31 NOTE — Letter (Signed)
Summary: Generic Letter  [facility]  [address] [place], [place] [address]   Phone: [phone]  Fax: [phone]    06/08/2007  [facility] [address] [place], [place]  [address]  Dear Ms. Hutcherson,  We have scheduled an appointment for you.  At the recommendation of Dr. Posey Rea, we have scheduled you a consult with Dr. Vickey Huger on August 10, 2007 at 10:30am, their phone number is [phone].  If you wish to decline or reschedule this appointment, please call the number of the office you are being referred to listed above and let them know.  Below is the address to Dr. Oliva Bustard office: [facility] [address] [place], [place] [address]    Sincerely,  Patient Care Coordinator [facility]

## 2010-08-31 NOTE — Assessment & Plan Note (Signed)
Summary: 4 MO ROV /NWS $50   Vital Signs:  Patient profile:   70 year old female Height:      64 inches Weight:      193 pounds BMI:     33.25 Temp:     98.3 degrees F oral Pulse rate:   69 / minute BP sitting:   116 / 62  (left arm)  Vitals Entered By: Tora Perches (February 04, 2009 2:45 PM) CC: f/u Is Patient Diabetic? No   CC:  f/u.  History of Present Illness: The patient presents for a follow up of back pain, hip pain, anxiety, depression and headaches. Mobic did not work well   Current Medications (verified): 1)  Zolpidem Tartrate 10 Mg  Tabs (Zolpidem Tartrate) .... 1/2 or 1 By Mouth At [facility] Prn 2)  Vitamin B-12 1000 Mcg  Tabs (Cyanocobalamin) .Marland Kitchen.. 1 Qd 3)  Vitamin D3 1000 Unit  Tabs (Cholecalciferol) .... 2 Qd 4)  Valium 5 Mg Tabs (Diazepam) .... Once Daily As Needed 5)  Celebrex 200 Mg Caps (Celecoxib) .... As Needed 6)  Alprazolam 0.25 Mg Tabs (Alprazolam) .Marland Kitchen.. 1 By Mouth Two Times A Day As Needed Anxiety 7)  Caltrate 600 1500 Mg Tabs (Calcium Carbonate) .... Two Times A Day 8)  Fish Oil 1000 Mg Caps (Omega-3 Fatty Acids) .... Two Times A Day 9)  Glucosamine Sulfate 750 Mg Caps (Glucosamine Sulfate) .... Once Daily 10)  Vitamin D 04540 Unit  Caps (Ergocalciferol) .Marland Kitchen.. 1 By Mouth Weekly 11)  Premarin 1.0gr .... 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    Physical Exam  General:  overweight-appearing.   Nose:  External nasal examination shows no deformity or inflammation. Nasal mucosa are pink and moist without lesions or exudates. Mouth:  Oral mucosa and oropharynx without lesions or exudates.  Teeth in good repair. Neck:  Cervical spine is tender to palpation over paraspinal muscles and with the ROM  Lungs:  Normal respiratory effort, chest expands symmetrically. Lungs are clear to auscultation, no crackles or wheezes. Heart:  Normal rate and regular rhythm. S1 and S2 normal without gallop, murmur, click, rub or other extra sounds. Abdomen:  Bowel sounds positive,abdomen soft and non-tender without masses, organomegaly or hernias noted. Msk:  Lumbar-sacral spine is tender to palpation over paraspinal muscles and painfull with the ROM  B hips are stiff Extremities:  No clubbing, cyanosis, edema, or deformity noted with normal full range of motion of all joints.   Neurologic:  No cranial nerve deficits noted. Station and gait are normal. Plantar reflexes are down-going bilaterally. DTRs are symmetrical throughout. Sensory, motor and coordinative functions appear intact. Skin:  Intact without suspicious lesions or rashes Psych:  Oriented X3.     Impression & Recommendations:  Problem # 1:  VITAMIN D DEFICIENCY (ICD-268.9) Assessment Improved On prescription therapy   Problem # 2:  INGUINAL  PAIN, RIGHT (ICD-789.09) MSK Assessment: Deteriorated  Her updated medication list for this problem includes:    Celebrex 200 Mg Caps (Celecoxib) .Marland Kitchen... As needed  Problem # 3:  ANEMIA, VITAMIN B12 DEFICIENCY NEC (ICD-281.1) Assessment: Comment Only  Her updated medication list for this problem includes:    Vitamin B-12 1000 Mcg Tabs (Cyanocobalamin) .Marland Kitchen... 1 qd  Problem # 4:  LOW BACK PAIN (ICD-724.2) Assessment:  Deteriorated Start taking a yoga class  Her updated medication list for this problem includes:    Celebrex 200 Mg Caps (Celecoxib) .Marland Kitchen... As needed  Complete Medication List: 1)  Zolpidem Tartrate 10 Mg Tabs (Zolpidem tartrate) .... 1/2 or 1 by mouth at hs prn 2)  Vitamin B-12 1000 Mcg Tabs (Cyanocobalamin) .Marland Kitchen.. 1 qd 3)  Vitamin D3 1000 Unit Tabs (Cholecalciferol) .... 2 qd 4)  Valium 5 Mg Tabs (Diazepam) .... Once daily as needed 5)  Celebrex 200 Mg Caps (Celecoxib) .... As needed 6)  Alprazolam 0.25 Mg Tabs (Alprazolam) .Marland Kitchen.. 1 by mouth two times a day as needed anxiety for flights only 7)  Caltrate 600 1500 Mg Tabs (Calcium carbonate) .... Two times a day 8)  Fish Oil 1000 Mg Caps (Omega-3 fatty acids) .... Two times a day 9)  Glucosamine Sulfate 750 Mg Caps (Glucosamine sulfate) .... Once daily 10)  Vitamin D 16109 Unit Caps (Ergocalciferol) .Marland Kitchen.. 1 by mouth weekly 11)  Premarin 1.0gr  .... 2 times a week  Patient Instructions: 1)  Try to eat more raw plant food, fresh and dry fruit, raw almonds, leafy vegies, whole foods and less meats, less animal fat. Avoid processed foods (canned soups, hot dogs, sausage , frozen dinners). Avoid animal fat, red meats.   2)  Start taking a yoga class 3)  [email or web address] 4)  [email or web address] 5)  Please schedule a follow-up appointment in 4 months. 6)  BMP prior to visit, ICD-9: 7)  Vit B12 266.2 8)  Vit D 268.9 9)  TSH prior to visit, ICD-9:

## 2010-08-31 NOTE — Assessment & Plan Note (Signed)
Summary: 2 MO ROV /NWS $50   Vital Signs:  Patient profile:   70 year old female Weight:      195 pounds Temp:     97.2 degrees F oral Pulse rate:   84 / minute BP sitting:   120 / 80  (left arm)  Vitals Entered By: Tora Perches (October 08, 2008 2:39 PM)  Is Patient Diabetic? No   History of Present Illness: C/o forgetting words The patient presents for a follow up of back pain, anxiety, insomnia and headaches.   Problems Prior to Update: 1)  Memory Loss  (ICD-780.93) 2)  Inguinal Pain, Right  (ICD-789.09) 3)  Upper Respiratory Infection (URI)  (ICD-465.9) 4)  Neck Pain  (ICD-723.1) 5)  Anemia, Vitamin B12 Deficiency Nec  (ICD-281.1) 6)  Vitamin D Deficiency  (ICD-268.9) 7)  Osteoarthritis  (ICD-715.90) 8)  Low Back Pain  (ICD-724.2) 9)  Hyperlipidemia  (ICD-272.4) 10)  Anxiety  (ICD-300.00) 11)  Family History of Cad Female 1st Degree Relative <60  (ICD-V16.49)  Medications Prior to Update: 1)  Zolpidem Tartrate 10 Mg  Tabs (Zolpidem Tartrate) .... 1/2 or 1 By Mouth At [facility] Prn 2)  Vitamin B-12 1000 Mcg  Tabs (Cyanocobalamin) .Marland Kitchen.. 1 Qd 3)  Vitamin D3 1000 Unit  Tabs (Cholecalciferol) .... 2 Qd 4)  Valium 5 Mg Tabs (Diazepam) .... Once Daily As Needed 5)  Celebrex 200 Mg Caps (Celecoxib) .... As Needed 6)  Alprazolam 0.25 Mg Tabs (Alprazolam) .Marland Kitchen.. 1 By Mouth Two Times A Day As Needed Anxiety 7)  Tamiflu 75 Mg Caps (Oseltamivir Phosphate) .... Take One Capsule By Mouth Twice A Day  Current Medications (verified): 1)  Zolpidem Tartrate 10 Mg  Tabs (Zolpidem Tartrate) .... 1/2 or 1 By Mouth At [facility] Prn 2)  Vitamin B-12 1000 Mcg  Tabs (Cyanocobalamin) .... 2 Qd 3)  Vitamin D3 1000 Unit  Tabs (Cholecalciferol) .... 2 Qd 4)  Valium 5 Mg Tabs (Diazepam) .... Once Daily As Needed 5)  Celebrex 200 Mg Caps (Celecoxib) .... As Needed 6)  Alprazolam 0.25 Mg Tabs (Alprazolam) .Marland Kitchen.. 1 By Mouth Two Times A Day As Needed Anxiety 7)  Caltrate 600 1500 Mg Tabs (Calcium Carbonate) .... Two  Times A Day 8)  Fish Oil 1000 Mg Caps (Omega-3 Fatty Acids) .... Two Times A Day 9)  Vitamin C 1000 Mg Tabs (Ascorbic Acid) .... Once Daily 10)  Glucosamine Sulfate 750 Mg Caps (Glucosamine Sulfate) .... 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 Hysterectomy Cervical fusion 99 and 2009 C3-4  Elsner  (08/12/2008)  Family History: polymyositis Family History of CAD Female 1st degree relative <60 Family History Hypertension  (05/02/2007)  Social History: Retired Single Former Smoker Regular exercise-no  (05/02/2007)  Risk Factors: Alcohol Use: N/A >5 drinks/d w/in last 3 months: N/A Caffeine Use: N/A Diet: N/A Exercise: no (05/02/2007)  Risk Factors: Smoking Status: quit (05/02/2007) Packs/Day: N/A Cigars/wk: N/A Pipe Use/wk: N/A Cans of tobacco/wk: N/A Passive Smoke Exposure: N/A  Review of Systems  The patient denies anorexia, fever, chest pain, and melena.    Physical Exam  General:  overweight-appearing.   Nose:  External nasal examination shows no deformity or inflammation. Nasal mucosa are pink and moist without lesions or exudates. Mouth:  Oral  mucosa and oropharynx without lesions or exudates.  Teeth in good repair. Neck:  Cervical spine is tender to palpation over paraspinal muscles and with the ROM  Lungs:  Normal respiratory effort, chest expands symmetrically. Lungs are clear to auscultation, no crackles or wheezes. Heart:  Normal rate and regular rhythm. S1 and S2 normal without gallop, murmur, click, rub or other extra sounds. Abdomen:  Bowel sounds positive,abdomen soft and non-tender without masses, organomegaly or hernias noted. Msk:  Lumbar-sacral spine is  tender to palpation over paraspinal muscles and painfull with the ROM  Neurologic:  No cranial nerve deficits noted. Station and gait are normal. Plantar reflexes are down-going bilaterally. DTRs are symmetrical throughout. Sensory, motor and coordinative functions appear intact. Skin:  Intact without suspicious lesions or rashes Psych:  Oriented X3.     Impression & Recommendations:  Problem # 1:  MEMORY LOSS (ICD-780.93) Assessment New Cut back on Benzodiazepines. Will w/u further if not better  Problem # 2:  NECK PAIN (ICD-723.1) Assessment: Comment Only  Her updated medication list for this problem includes:    Celebrex 200 Mg Caps (Celecoxib) .Marland Kitchen... As needed  Problem # 3:  VITAMIN D DEFICIENCY (ICD-268.9) Assessment: Comment Only Start Vit D2 50 000 iu weekly for 6 weeks; when finished, start Vit D3 1000 iu daily.  Problem # 4:  ANEMIA, VITAMIN B12 DEFICIENCY NEC (ICD-281.1) Assessment: Improved  Her updated medication list for this problem includes:    Vitamin B-12 1000 Mcg Tabs (Cyanocobalamin) .Marland Kitchen... 1 qd  Complete Medication List: 1)  Zolpidem Tartrate 10 Mg Tabs (Zolpidem tartrate) .... 1/2 or 1 by mouth at hs prn 2)  Vitamin B-12 1000 Mcg Tabs (Cyanocobalamin) .Marland Kitchen.. 1 qd 3)  Vitamin D3 1000 Unit Tabs (Cholecalciferol) .... 2 qd 4)  Valium 5 Mg Tabs (Diazepam) .... Once daily as needed 5)  Celebrex 200 Mg Caps (Celecoxib) .... As needed 6)  Alprazolam 0.25 Mg Tabs (Alprazolam) .Marland Kitchen.. 1 by mouth two times a day as needed anxiety 7)  Caltrate 600 1500 Mg Tabs (Calcium carbonate) .... Two times a day 8)  Fish Oil 1000 Mg Caps (Omega-3 fatty acids) .... Two times a day 9)  Glucosamine Sulfate 750 Mg Caps (Glucosamine sulfate) .... Once daily 10)  Vitamin D 09811 Unit Caps (Ergocalciferol) .Marland Kitchen.. 1 by mouth weekly  Patient Instructions: 1)  Please schedule a follow-up appointment in 4 months. 2)  The medication list was reviewed and reconciled.  All changed / newly prescribed  medications were explained.  A complete medication list was provided to the patient / caregiver.  3)  BMP prior to visit, ICD-9: 4)  VitD 268.9 Prescriptions: VITAMIN D 91478 UNIT  CAPS (ERGOCALCIFEROL) 1 by mouth weekly  #6 x 0   Entered and Authorized by:   Tresa Garter MD   Signed by:   Tresa Garter MD on 10/08/2008   Method used:   Electronically to        [facility]  [phone]* (retail)       [address] [place], [place]  [address]       Ph: [phone] or [phone]       Fax: [phone]   RxID:   [id number]

## 2010-08-31 NOTE — Progress Notes (Signed)
Summary: Req for referral  Phone Note Call from Patient Call back at Home Phone [phone]   Summary of Call: Pt had neck surgery 8 years ago. Pt is complaining of tingling & numbness in arm exactly like before surgery 8 years ago. Patient is requesting referral to neurologist who did her surgery. Initial call taken by: Lamar Sprinkles,  June 06, 2007 12:44 PM  Follow-up for Phone Call        Who was a Neurosurgeon? Follow-up by: Tresa Garter MD,  June 06, 2007 4:58 PM  Additional Follow-up for Phone Call Additional follow up Details #1::        [facility] on [facility], & Neuro surgeon Barnett Abu. Pt must see neurologist before surgeon. Additional Follow-up by: Lamar Sprinkles,  June 07, 2007 9:04 AM  New Problems: NECK PAIN (ICD-723.1)   Additional Follow-up for Phone Call Additional follow up Details #2::    Please inform her. She can see a neurologist or me prior  Follow-up by: Tresa Garter MD,  June 07, 2007 10:02 AM  Additional Follow-up for Phone Call Additional follow up Details #3:: Details for Additional Follow-up Action Taken: You must do an order for referral for pt to see neurologist Additional Follow-up by: Lamar Sprinkles,  June 07, 2007 10:51 AM  New Problems: NECK PAIN (ICD-723.1)

## 2010-08-31 NOTE — Consult Note (Signed)
Summary: [facility]  [facility]   Imported By: Esmeralda Links D'jimraou 05/05/2008 14:57:39  _____________________________________________________________________  External Attachment:    Type:   Image     Comment:   External Document

## 2010-08-31 NOTE — Assessment & Plan Note (Signed)
Summary: CPX-LB   Vital Signs:  Patient profile:   70 year old female Height:      64 inches Weight:      189 pounds BMI:     32.56 Temp:     98.2 degrees F oral Pulse rate:   96 / minute Pulse rhythm:   regular Resp:     16 per minute BP sitting:   138 / 88  (left arm) Cuff size:   regular  Vitals Entered By: Lanier Prude, CMA(AAMA) (June 21, 2010 8:20 AM) CC: CPX Is Patient Diabetic? No   CC:  CPX.  History of Present Illness: The patient presents for a preventive health examination  Patient past medical history, social history, and family history reviewed in detail no significant changes.  Patient is physically active. Depression is negative and mood is good. Hearing is normal, and able to perform activities of daily living. Risk of falling is low and home safety has been reviewed and is appropriate. Patient has normal height, she is overweight, and visual acuity is ok. Patient has been counseled on age-appropriate routine health concerns for screening and prevention. Education, counseling done.  The patient presents for a follow up of R hip/back pain, anxiety, insomnia and headaches.   Preventive Screening-Counseling & Management  Alcohol-Tobacco     Alcohol drinks/day: <1     Smoking Status: quit > 6 months  Caffeine-Diet-Exercise     Caffeine Counseling: not indicated; caffeine use is not excessive or problematic     Diet Counseling: not indicated; diet is assessed to be healthy     Does Patient Exercise: no     Exercise Counseling: to improve exercise regimen     Depression Counseling: not indicated; screening negative for depression  Hep-HIV-STD-Contraception     Hepatitis Risk: no risk noted     Sun Exposure-Excessive: no  Safety-Violence-Falls     Seat Belt Use: yes     Fall Risk Counseling: counseling provided; falls with injury noted  Current Medications (verified): 1)  Zolpidem Tartrate 10 Mg  Tabs (Zolpidem Tartrate) .... 1/2 or 1 By Mouth At [facility]  Prn 2)  Vitamin B-12 1000 Mcg  Tabs (Cyanocobalamin) .Marland Kitchen.. 1 Qd 3)  Vitamin D3 1000 Unit  Tabs (Cholecalciferol) .... 2 Qd 4)  Valium 5 Mg Tabs (Diazepam) .... Take Bid As Needed 5)  Celebrex 200 Mg Caps (Celecoxib) .... As Needed 6)  Alprazolam 0.25 Mg Tabs (Alprazolam) .Marland Kitchen.. 1 By Mouth Two Times A Day As Needed Anxiety For Flights Only 7)  Fish Oil 1000 Mg Caps (Omega-3 Fatty Acids) .... Two Times A Day 8)  Premarin 1.0gr .... 2 Times A Week 9)  Glucosamine Sulfate 750 Mg Caps (Glucosamine Sulfate) .... Once Daily 10)  Aleve 220 Mg Tabs (Naproxen Sodium) .... As Needed For Pain 11)  Caltrate 600 1500 Mg Tabs (Calcium Carbonate) .Marland Kitchen.. 1 Once Daily 12)  Phentermine Hcl 37.5 Mg Tabs (Phentermine Hcl) .Marland Kitchen.. 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  The patient denies anorexia, fever, weight loss, weight gain, vision loss, decreased hearing, hoarseness, chest pain, syncope, dyspnea on exertion, peripheral edema, prolonged cough, headaches, hemoptysis, abdominal pain, melena, hematochezia, severe indigestion/heartburn, hematuria, incontinence, genital sores, muscle  weakness, suspicious skin lesions, transient blindness, depression, unusual weight change, abnormal bleeding, enlarged lymph nodes, angioedema, and breast masses.    Physical Exam  General:  overweight-appearing.   Head:  Normocephalic and atraumatic without obvious abnormalities. No apparent alopecia or balding. Eyes:  No corneal or conjunctival inflammation noted. EOMI. Perrla.  Ears:  External ear exam shows no significant lesions or deformities.  Otoscopic examination reveals clear canals, tympanic membranes are intact bilaterally without bulging, retraction, inflammation or discharge. Hearing is grossly normal bilaterally. Nose:  External nasal examination shows no deformity or inflammation. Nasal mucosa are pink and moist without lesions or exudates. Mouth:  Oral mucosa and oropharynx without lesions or exudates.  Teeth in good repair. Neck:  No deformities, masses, or tenderness noted. Lungs:  Normal respiratory effort, chest expands symmetrically. Lungs are clear to auscultation, no crackles or wheezes. Heart:  Normal rate and regular rhythm. S1 and S2 normal without gallop, murmur, click, rub or other extra sounds. Abdomen:  Bowel sounds positive,abdomen soft and non-tender without masses, organomegaly or hernias noted. Msk:  Lumbar-sacral spine is tender to palpation over paraspinal muscles and painfull with the ROM  R hip is tender Walking w/o a cane Pulses:  R and L carotid,radial,femoral,dorsalis pedis and posterior tibial pulses are full and equal bilaterally Extremities:  No clubbing, cyanosis, edema, or deformity noted with normal full range of motion of all joints.   Neurologic:  No cranial nerve deficits noted. Station and gait are normal. Plantar reflexes are down-going bilaterally. DTRs are symmetrical throughout. Sensory, motor and coordinative functions appear intact. Skin:  Intact without suspicious lesions or rashes Very mild alopecia on top of the scalp Cervical Nodes:   No lymphadenopathy noted Psych:  Oriented X3.     Impression & Recommendations:  Problem # 1:  ROUTINE GENERAL MEDICAL EXAM@HEALTH  CARE FACL (ICD-V70.0) Assessment New Overall doing well, age appropriate education and counseling updated and referral for appropriate preventive services done unless declined, immunizations up to date or declined, diet counseling done if overweight, urged to quit smoking if smokes, most recent labs reviewed and current ordered if appropriate, ecg reviewed or declined (interpretation per ECG scanned in the EMR if done); information regarding Medicare Preventation requirements given if appropriate.  Orders: EKG w/ Interpretation (93000) TLB-BMP (Basic Metabolic Panel-BMET) (80048-METABOL) TLB-CBC Platelet - w/Differential (85025-CBCD) TLB-Hepatic/Liver Function Pnl (80076-HEPATIC) TLB-Lipid Panel (80061-LIPID) TLB-TSH (Thyroid Stimulating Hormone) (84443-TSH) TLB-Udip ONLY (81003-UDIP) Medicare -1st Annual Wellness Visit [phone]) I have personally reviewed the Medicare Annual Wellness questionnaire and have noted 1.   The patient's medical and social history 2.   Their use of alcohol, tobacco or illicit drugs 3.   Their current medications and supplements 4.   The patient's functional ability including ADL's, fall risks, home safety risks and hearing or visual             impairment. 5.   Diet and physical activities 6.   Evidence for depression or mood disorders  The patients weight, height, BMI and visual acuity have been  recorded in the chart I have made referrals, counseling and provided education to the patient based review of the above and I have provided the pt with a written personalized care plan for preventive services.  Complete Medication List: 1)  Zolpidem Tartrate 10 Mg Tabs (Zolpidem tartrate) .... 1/2 or 1 by mouth at hs prn 2)  Vitamin B-12 1000 Mcg Tabs (Cyanocobalamin) .Marland Kitchen.. 1 qd 3)  Vitamin D3 1000 Unit Tabs (Cholecalciferol) .... 2 qd 4)   Valium 5 Mg Tabs (Diazepam) .... Take bid as needed 5)  Celebrex 200 Mg Caps (Celecoxib) .... As needed 6)  Alprazolam 0.25 Mg Tabs (Alprazolam) .Marland Kitchen.. 1 by mouth two times a day as needed anxiety for flights only 7)  Fish Oil 1000 Mg Caps (Omega-3 fatty acids) .... Two times a day 8)  Premarin 1.0gr  .... 2 times a week 9)  Glucosamine Sulfate 750 Mg Caps (Glucosamine sulfate) .... Once daily 10)  Aleve 220 Mg Tabs (Naproxen sodium) .... As needed for pain 11)  Caltrate 600 1500 Mg Tabs (Calcium carbonate) .Marland Kitchen.. 1 once daily 12)  Phentermine Hcl 37.5 Mg Tabs (Phentermine hcl) .Marland Kitchen.. 1 by mouth qam  Other Orders: T-Vitamin D (25-Hydroxy) (16109-60454)  Patient Instructions: 1)  Please schedule a follow-up appointment in 6 months. 2)  Start taking a chair yoga class  Prescriptions: ZOLPIDEM TARTRATE 10 MG  TABS (ZOLPIDEM TARTRATE) 1/2 or 1 by mouth at hs prn  #30 x 5   Entered and Authorized by:   Tresa Garter MD   Signed by:   Tresa Garter MD on 06/21/2010   Method used:   Print then Give to Patient   RxID:   [id number]    Orders Added: 1)  EKG w/ Interpretation [93000] 2)  T-Vitamin D (25-Hydroxy) [30865-78469] 3)  TLB-BMP (Basic Metabolic Panel-BMET) [80048-METABOL] 4)  TLB-CBC Platelet - w/Differential [85025-CBCD] 5)  TLB-Hepatic/Liver Function Pnl [80076-HEPATIC] 6)  TLB-Lipid Panel [80061-LIPID] 7)  TLB-TSH (Thyroid Stimulating Hormone) [84443-TSH] 8)  TLB-Udip ONLY [81003-UDIP] 9)  Medicare -1st Annual Wellness Visit [G0438]   Immunization History:  Pneumovax Immunization History:    Pneumovax:  historical (06/04/2008)   Immunization History:  Pneumovax Immunization History:    Pneumovax:  Historical (06/04/2008)  Contraindications/Deferment of Procedures/Staging:    Test/Procedure: DPT vaccine    Reason for deferment: declined

## 2010-08-31 NOTE — Assessment & Plan Note (Signed)
   Vital Signs:  Patient Profile:   70 Years Old Female Weight:      183 pounds Pulse rate:   69 / minute BP sitting:   136 / 77                 Chief Complaint:  Multiple medical problems or concerns.  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 Back care instructions given. To be seen in 2 weeks if no improvement; sooner if worsening of symptoms.   Problem # 2:  ANEMIA, VITAMIN B12 DEFICIENCY NEC (ICD-281.1) Assessment: Unchanged  Her updated medication list for this problem includes:    Vitamin B-12 1000 Mcg Tabs (Cyanocobalamin) .Marland Kitchen... 1 qd   Problem # 3:  VITAMIN D DEFICIENCY (ICD-268.9) Assessment: Unchanged  Problem # 4:  ANXIETY (ICD-300.00) Assessment: Unchanged  Complete Medication List: 1)  Zolpidem Tartrate 10 Mg Tabs (Zolpidem tartrate) .... 1/2 or 1 by mouth at hs prn 2)  Vitamin B-12 1000 Mcg Tabs (Cyanocobalamin) .Marland Kitchen.. 1 qd 3)  Vitamin D3 1000 Unit Tabs (Cholecalciferol) .Marland Kitchen.. 1 qd   Patient Instructions: 1)  Please schedule a follow-up appointment in 6 months.    ]

## 2010-08-31 NOTE — Miscellaneous (Signed)
Summary: zolpidem  Clinical Lists Changes  Medications: Rx of ZOLPIDEM TARTRATE 10 MG  TABS (ZOLPIDEM TARTRATE) 1/2 or 1 by mouth at hs prn;  #30 x 6;  Signed;  Entered by: Tora Perches;  Authorized by: Tresa Garter MD;  Method used: [facility]  [id number]*, [address] [place], [place]  [address], Ph: [phone] or [phone], Fax: [phone]    Prescriptions: ZOLPIDEM TARTRATE 10 MG  TABS (ZOLPIDEM TARTRATE) 1/2 or 1 by mouth at hs prn  #30 x 6   Entered by:   Tora Perches   Authorized by:   Tresa Garter MD   Signed by:   Tora Perches on 11/23/2007   Method used:   Telephoned to ...       [facility]  [phone]*       [address]       [place], [place]  [address]       Ph: [phone] or [phone]       Fax: [phone]   RxID:   [id number]

## 2010-08-31 NOTE — Progress Notes (Signed)
Summary: diazepam  Phone Note Other Incoming Call back at fax   [facility] [phone] Details for Reason: request refill on diazepam Details of Action Taken: ok x 5 refills/vg  will fax to pharmacy/vg Initial call taken by: Tora Perches,  Dec 04, 2008 1:27 PM      Prescriptions: VALIUM 5 MG TABS (DIAZEPAM) once daily as needed  #30 x 5   Entered by:   Tora Perches   Authorized by:   Tresa Garter MD   Signed by:   Tora Perches on 12/04/2008   Method used:   Telephoned to ...       [facility]  [phone]* (retail)       [address] [place], [place]  [address]       Ph: [phone] or [phone]       Fax: [phone]   RxID:   [id number]

## 2010-08-31 NOTE — Consult Note (Signed)
Summary: [facility]  [facility]   Imported By: Esmeralda Links D'jimraou 09/07/2007 09:36:59  _____________________________________________________________________  External Attachment:    Type:   Image     Comment:   External Document

## 2010-08-31 NOTE — Progress Notes (Signed)
Summary: Records request from [facility]  A request for patients recent Colonoscopy was received in the morning mail.

## 2010-08-31 NOTE — Assessment & Plan Note (Signed)
Summary: 4 MO ROV/NWS $50-PER PT MEDICAL CLEARANCE-ORTHO SURGERY-STC   Vital Signs:  Patient profile:   70 year old female Weight:      189 pounds Temp:     98.6 degrees F oral Pulse rate:   84 / minute BP sitting:   124 / 82  (left arm)  Vitals Entered By: Tora Perches (June 12, 2009 8:22 AM) CC: f/u Is Patient Diabetic? No   CC:  f/u.  History of Present Illness: Req Dr Despina Hick Reason R THR in Jan2011 Hx: The patient presents for a follow up of hypertension, OA, hyperlipidemia - all stable  Preventive Screening-Counseling & Management  Alcohol-Tobacco     Smoking Status: quit  Current Medications (verified): 1)  Zolpidem Tartrate 10 Mg  Tabs (Zolpidem Tartrate) .... 1/2 or 1 By Mouth At [facility] Prn 2)  Vitamin B-12 1000 Mcg  Tabs (Cyanocobalamin) .Marland Kitchen.. 1 Qd 3)  Vitamin D3 1000 Unit  Tabs (Cholecalciferol) .... 2 Qd 4)  Valium 5 Mg Tabs (Diazepam) .... Once Daily As Needed 5)  Celebrex 200 Mg Caps (Celecoxib) .... As Needed 6)  Alprazolam 0.25 Mg Tabs (Alprazolam) .Marland Kitchen.. 1 By Mouth Two Times A Day As Needed Anxiety For Flights Only 7)  Caltrate 600 1500 Mg Tabs (Calcium Carbonate) .... Two Times A Day 8)  Fish Oil 1000 Mg Caps (Omega-3 Fatty Acids) .... Two Times A Day 9)  Glucosamine Sulfate 750 Mg Caps (Glucosamine Sulfate) .... Once Daily 10)  Vitamin D 16109 Unit  Caps (Ergocalciferol) .Marland Kitchen.. 1 By Mouth Weekly 11)  Premarin 1.0gr .... 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  The patient denies anorexia, fever, weight loss, weight gain, vision loss, decreased hearing, hoarseness, chest pain, syncope, dyspnea on exertion, peripheral edema, prolonged cough, headaches, hemoptysis, abdominal pain, melena, hematochezia, severe indigestion/heartburn, hematuria, incontinence, genital sores, muscle weakness, suspicious skin lesions, transient blindness, depression, unusual weight change, abnormal bleeding, enlarged lymph nodes, angioedema, and breast masses.    Physical Exam  General:  overweight-appearing.   Head:  Normocephalic and atraumatic without obvious abnormalities. No apparent alopecia or balding. Eyes:  No corneal or conjunctival inflammation noted. EOMI. Perrla.  Ears:  External ear exam shows no significant lesions or deformities.  Otoscopic examination reveals clear canals, tympanic membranes are intact bilaterally without bulging, retraction, inflammation or discharge. Hearing is grossly normal bilaterally. Nose:  External nasal examination shows no deformity or inflammation. Nasal mucosa are pink and moist without lesions or exudates. Mouth:  Oral mucosa and oropharynx without lesions or exudates.  Teeth in good repair. Neck:  No deformities, masses, or tenderness noted. Lungs:  Normal respiratory effort, chest expands symmetrically. Lungs are clear to auscultation, no crackles or wheezes. Heart:  Normal rate and regular rhythm. S1 and S2 normal without gallop, murmur, click, rub or other extra sounds. Abdomen:  Bowel sounds positive,abdomen soft and non-tender without masses, organomegaly or hernias  noted. Msk:  Lumbar-sacral spine is tender to palpation over paraspinal muscles and painfull with the ROM  R hips are stiff Pulses:  R and L  carotid,radial,femoral,dorsalis pedis and posterior tibial pulses are full and equal bilaterally Extremities:  No clubbing, cyanosis, edema, or deformity noted with normal full range of motion of all joints.   Neurologic:  No cranial nerve deficits noted. Station and gait are normal. Plantar reflexes are down-going bilaterally. DTRs are symmetrical throughout. Sensory, motor and coordinative functions appear intact. Skin:  Intact without suspicious lesions or rashes Cervical Nodes:  No lymphadenopathy noted Psych:  Oriented X3.     Impression & Recommendations:  Problem # 1:  PREOPERATIVE EXAMINATION (ICD-V72.84) Assessment Comment Only She should be clear for surgery assuming her pre-op tests are acceptable. Thank you! Orders: EKG w/ Interpretation (93000) WNL Echo Referral (Echo) ordered  Problem # 2:  OSTEOARTHRITIS (ICD-715.90) Assessment: Comment Only  Her updated medication list for this problem includes:    Celebrex 200 Mg Caps (Celecoxib) .Marland Kitchen... As needed  Problem # 3:  VITAMIN D DEFICIENCY (ICD-268.9) Assessment: Comment Only On prescription therapy   Problem # 4:  HYPERLIPIDEMIA (ICD-272.4) Assessment: Comment Only  Complete Medication List: 1)  Zolpidem Tartrate 10 Mg Tabs (Zolpidem tartrate) .... 1/2 or 1 by mouth at hs prn 2)  Vitamin B-12 1000 Mcg Tabs (Cyanocobalamin) .Marland Kitchen.. 1 qd 3)  Vitamin D3 1000 Unit Tabs (Cholecalciferol) .... 2 qd 4)  Valium 5 Mg Tabs (Diazepam) .... Once daily as needed 5)  Celebrex 200 Mg Caps (Celecoxib) .... As needed 6)  Alprazolam 0.25 Mg Tabs (Alprazolam) .Marland Kitchen.. 1 by mouth two times a day as needed anxiety for flights only 7)  Fish Oil 1000 Mg Caps (Omega-3 fatty acids) .... Two times a day 8)  Premarin 1.0gr  .... 2 times a week 9)  Vitamin D (ergocalciferol) 50000 Unit Caps (Ergocalciferol) .Marland Kitchen.. 1 by mouth q 1 week 10)  Glucosamine Sulfate 750 Mg Caps (Glucosamine sulfate) .... Once daily  Patient Instructions: 1)  Try to eat more  raw plant food, fresh and dry fruit, raw almonds, leafy vegetables, whole foods and less red meat, less animal fat. Poultry and fish is better for you than pork and beef. Avoid processed foods (canned soups, hot dogs, sausage, bacon , frozen dinners). Avoid corn syrup, high fructose syrup or aspartam and Splenda  containing drinks. Honey, Agave and Stevia are better sweeteners. Make your own  dressing with olive oil, wine vinegar, lemon juce, garlic etc. for your salads. 2)  Kefir 3)  Please schedule a follow-up appointment in 3 months. Prescriptions: VITAMIN D (ERGOCALCIFEROL) 50000 UNIT CAPS (ERGOCALCIFEROL) 1 by mouth q 1 week  #6 x 0   Entered and Authorized by:   Tresa Garter MD   Signed by:   Tresa Garter MD on 06/12/2009   Method used:   Electronically to        [facility]  [phone]* (retail)       [address] [place], [place]  [address]       Ph: [phone] or [phone]       Fax: [phone]   RxID:   [id number] VITAMIN D (ERGOCALCIFEROL) 50000 UNIT CAPS (ERGOCALCIFEROL) 1 by mouth q 1 mk  #6 x 0   Entered and Authorized by:   Tresa Garter MD   Signed by:   Tresa Garter MD on 06/12/2009   Method used:  Electronically to        [facility]  [phone]* (retail)       [address] [place], [place]  [address]       Ph: [phone] or [phone]       Fax: [phone]   RxID:   [id number]

## 2010-08-31 NOTE — Assessment & Plan Note (Signed)
Summary: COLD/CONGESTION/NWS   Vital Signs:  Patient profile:   70 year old female Height:      64 inches Weight:      190 pounds BMI:     32.73 O2 Sat:      96 % on Room air Temp:     97.4 degrees F oral Pulse rate:   97 / minute BP sitting:   144 / 96  (left arm) Cuff size:   large  Vitals Entered By: Bill Salinas CMA (July 20, 2009 9:27 AM)  O2 Flow:  Room air CC: pt complains of cough, congestion and runny nose x 1 week/ ab   CC:  pt complains of cough and congestion and runny nose x 1 week/ ab.  History of Present Illness: The patient presents with complaints of sore throat, fever, cough, sinus congestion and drainge of 11 days duration. Not better with OTC meds. Chest hurts with coughing. Can't sleep due to cough. Muscle aches are present - not bad.  The mucus is colored.   Current Medications (verified): 1)  Zolpidem Tartrate 10 Mg  Tabs (Zolpidem Tartrate) .... 1/2 or 1 By Mouth At [facility] Prn 2)  Vitamin B-12 1000 Mcg  Tabs (Cyanocobalamin) .Marland Kitchen.. 1 Qd 3)  Vitamin D3 1000 Unit  Tabs (Cholecalciferol) .... 2 Qd 4)  Valium 5 Mg Tabs (Diazepam) .... Once Daily As Needed 5)  Celebrex 200 Mg Caps (Celecoxib) .... As Needed 6)  Alprazolam 0.25 Mg Tabs (Alprazolam) .Marland Kitchen.. 1 By Mouth Two Times A Day As Needed Anxiety For Flights Only 7)  Fish Oil 1000 Mg Caps (Omega-3 Fatty Acids) .... Two Times A Day 8)  Premarin 1.0gr .... 2 Times A Week 9)  Glucosamine Sulfate 750 Mg Caps (Glucosamine Sulfate) .... Once Daily  Allergies (verified): 1)  Phentermine Hcl (Phentermine Hcl) 2)  Vytorin (Ezetimibe-Simvastatin) 3)  Percocet (Oxycodone-Acetaminophen)  Past History:  Past Medical History: Last updated: 08/12/2008 Anxiety Hyperlipidemia Low back pain Osteoarthritis Vit D def Vit B12 def  Social History: Last updated: 05/02/2007 Retired Single Former Smoker Regular exercise-no  Physical Exam  General:  overweight-appearing.   Mouth:  Oral mucosa and oropharynx  without lesions or exudates.  Teeth in good repair. Lungs:  Normal respiratory effort, chest expands symmetrically. Lungs are clear to auscultation, no crackles or wheezes. Heart:  Normal rate and regular rhythm. S1 and S2 normal without gallop, murmur, click, rub or other extra sounds. Abdomen:  Bowel sounds positive,abdomen soft and non-tender without masses, organomegaly or hernias noted. Msk:  Lumbar-sacral spine is tender to palpation over paraspinal muscles and painfull with the ROM  R hips are stiff Neurologic:  No cranial nerve deficits noted. Station and gait are normal. Plantar reflexes are down-going bilaterally. DTRs are symmetrical throughout. Sensory, motor and coordinative functions appear intact.   Impression & Recommendations:  Problem # 1:  SINUSITIS, ACUTE (ICD-461.9)/ bronchitis Assessment New  Her updated medication list for this problem includes:    Zithromax Z-pak 250 Mg Tabs (Azithromycin) .Marland Kitchen... As dirrected    Promethazine-codeine 6.25-10 Mg/49ml Syrp (Promethazine-codeine) .Marland Kitchen... 5-10 ml by mouth q id as needed cough    Tessalon Perles 100 Mg Caps (Benzonatate) .Marland Kitchen... 1-2 by mouth two times a day as needed cogh  Complete Medication List: 1)  Zolpidem Tartrate 10 Mg Tabs (Zolpidem tartrate) .... 1/2 or 1 by mouth at hs prn 2)  Vitamin B-12 1000 Mcg Tabs (Cyanocobalamin) .Marland Kitchen.. 1 qd 3)  Vitamin D3 1000 Unit Tabs (Cholecalciferol) .... 2 qd 4)  Valium 5 Mg Tabs (Diazepam) .... Once daily as needed 5)  Celebrex 200 Mg Caps (Celecoxib) .... As needed 6)  Alprazolam 0.25 Mg Tabs (Alprazolam) .Marland Kitchen.. 1 by mouth two times a day as needed anxiety for flights only 7)  Fish Oil 1000 Mg Caps (Omega-3 fatty acids) .... Two times a day 8)  Premarin 1.0gr  .... 2 times a week 9)  Glucosamine Sulfate 750 Mg Caps (Glucosamine sulfate) .... Once daily 10)  Zithromax Z-pak 250 Mg Tabs (Azithromycin) .... As dirrected 11)  Promethazine-codeine 6.25-10 Mg/28ml Syrp (Promethazine-codeine)  .... 5-10 ml by mouth q id as needed cough 12)  Tessalon Perles 100 Mg Caps (Benzonatate) .Marland Kitchen.. 1-2 by mouth two times a day as needed cogh  Patient Instructions: 1)  Call if you are not better in a reasonable amount of time or if worse.  Prescriptions: TESSALON PERLES 100 MG CAPS (BENZONATATE) 1-2 by mouth two times a day as needed cogh  #120 x 1   Entered and Authorized by:   Tresa Garter MD   Signed by:   Tresa Garter MD on 07/20/2009   Method used:   [facility]ronically to        [facility]  [phone]* (retail)       [address] [place], [place]  [address]       Ph: [phone] or [phone]       Fax: [phone]   RxID:   [id number] PROMETHAZINE-CODEINE 6.25-10 MG/5ML SYRP (PROMETHAZINE-CODEINE) 5-10 ml by mouth q id as needed cough  #300 ml x 0   Entered and Authorized by:   Tresa Garter MD   Signed by:   Tresa Garter MD on 07/20/2009   Method used:   Print then Give to Patient   RxID:   [id number] ZITHROMAX Z-PAK 250 MG TABS (AZITHROMYCIN) as dirrected  #1 x 0   Entered and Authorized by:   Tresa Garter MD   Signed by:   Tresa Garter MD on 07/20/2009   Method used:   Electronically to        [facility]  [phone]* (retail)       [address] [place], [place]  [address]       Ph: [phone] or [phone]       Fax: [phone]   RxID:   [id number]    Preventive Care Screening  Last Flu Shot:    Date:  05/15/2009    Results:  given

## 2010-08-31 NOTE — Miscellaneous (Signed)
Summary: Outpatient Coinsurance Notice  Outpatient Coinsurance Notice   Imported By: Marylou Mccoy 07/20/2009 15:49:33  _____________________________________________________________________  External Attachment:    Type:   Image     Comment:   External Document

## 2010-08-31 NOTE — Assessment & Plan Note (Signed)
Summary: SORE THROAT /NWS   Vital Signs:  Patient Profile:   70 Years Old Female Weight:      189 pounds Temp:     98.9 degrees F oral Pulse rate:   83 / minute BP sitting:   164 / 91  (left arm)  Vitals Entered By: Tora Perches (September 19, 2007 4:33 PM)             Is Patient Diabetic? No     Chief Complaint:  sore throat.  History of Present Illness: The patient presents with complaints of sore throat, fever, cough, sinus congestion and drainge of several days duration. Not better with OTC meds. Chest hurts with coughing. Can't sleep due to cough. The mucus is colored.     Current Allergies (reviewed today): PHENTERMINE HCL (PHENTERMINE HCL) VYTORIN (EZETIMIBE-SIMVASTATIN) PERCOCET (OXYCODONE-ACETAMINOPHEN)  Past Medical History:    Reviewed history from 05/02/2007 and no changes required:       Anxiety       Hyperlipidemia       Low back pain       Osteoarthritis   Family History:    Reviewed history from 05/02/2007 and no changes required:       polymyositis       Family History of CAD Female 1st degree relative <60       Family History Hypertension  Social History:    Reviewed history from 05/02/2007 and no changes required:       Retired       Single       Former Smoker       Regular exercise-no     Physical Exam  General:     Well-developed,well-nourished,in no acute distress; alert,appropriate and cooperative throughout examination Eyes:     No corneal or conjunctival inflammation noted. EOMI. Perrla. Funduscopic exam benign, without hemorrhages, exudates or papilledema. Vision grossly normal. Ears:     External ear exam shows no significant lesions or deformities.  Otoscopic examination reveals clear canals, tympanic membranes are intact bilaterally without bulging, retraction, inflammation or discharge. Hearing is grossly normal bilaterally. Nose:     External nasal examination shows no deformity or inflammation. Nasal mucosa are pink and  moist without lesions or exudates. Mouth:     Eryth. mucosa Neck:     No deformities, masses, or tenderness noted. Lungs:     Normal respiratory effort, chest expands symmetrically. Lungs are clear to auscultation, no crackles or wheezes. Heart:     Normal rate and regular rhythm. S1 and S2 normal without gallop, murmur, click, rub or other extra sounds. Abdomen:     Bowel sounds positive,abdomen soft and non-tender without masses, organomegaly or hernias noted. Skin:     Intact without suspicious lesions or rashes    Impression & Recommendations:  Problem # 1:  UPPER RESPIRATORY INFECTION (URI) (ICD-465.9) Assessment: New Z pac Her updated medication list for this problem includes:    Promethazine-codeine 6.25-10 Mg/20ml Syrp (Promethazine-codeine) .Marland Kitchen... 5-10 cc by mouth q 4-6 h prn   Complete Medication List: 1)  Zolpidem Tartrate 10 Mg Tabs (Zolpidem tartrate) .... 1/2 or 1 by mouth at hs prn 2)  Vitamin B-12 1000 Mcg Tabs (Cyanocobalamin) .Marland Kitchen.. 1 qd 3)  Vitamin D3 1000 Unit Tabs (Cholecalciferol) .Marland Kitchen.. 1 qd 4)  Zithromax Z-pak 250 Mg Tabs (Azithromycin) .... As directed 5)  Promethazine-codeine 6.25-10 Mg/43ml Syrp (Promethazine-codeine) .... 5-10 cc by mouth q 4-6 h prn     Prescriptions: PROMETHAZINE-CODEINE 6.25-10 MG/5ML  SYRP (  PROMETHAZINE-CODEINE) 5-10 cc by mouth q 4-6 h prn  #300 cc x 0   Entered and Authorized by:   Tresa Garter MD   Signed by:   Tresa Garter MD on 09/19/2007   Method used:   Print then Give to Patient   RxID:   [id number] ZITHROMAX Z-PAK 250 MG  TABS (AZITHROMYCIN) as directed  #1 x 0   Entered and Authorized by:   Tresa Garter MD   Signed by:   Tresa Garter MD on 09/19/2007   Method used:   Print then Give to Patient   RxID:   [id number]  ]

## 2010-08-31 NOTE — Progress Notes (Signed)
Summary: CXR & CBC PT STILL SICK  Phone Note Call from Patient   Summary of Call: Pt seen recently, she is taking her last day of antibiotic today. She says that she has had a fever, & coughing up yellow mucus. Any other suggestions? Does she need a different med? Initial call taken by: Lamar Sprinkles,  September 24, 2007 11:14 AM  Follow-up for Phone Call        Z pac cont to work x 10 d. Get a CBC, CXR Follow-up by: Tresa Garter MD,  September 24, 2007 12:49 PM  Additional Follow-up for Phone Call Additional follow up Details #1::        Dx please ..................................................................Marland KitchenLamar Sprinkles  September 24, 2007 1:09 PM     Additional Follow-up for Phone Call Additional follow up Details #2::    Cough Fever Follow-up by: Tresa Garter MD,  September 24, 2007 5:38 PM  Additional Follow-up for Phone Call Additional follow up Details #3:: Details for Additional Follow-up Action Taken: Pt informed order in idx & emr Additional Follow-up by: Lamar Sprinkles,  September 24, 2007 5:58 PM

## 2010-08-31 NOTE — Assessment & Plan Note (Signed)
Summary: FU ON PAIN/ [facility] $50   Vital Signs:  Patient Profile:   70 Years Old Female Weight:      195 pounds Temp:     96.8 degrees F oral Pulse rate:   112 / minute BP sitting:   144 / 80  (left arm)  Vitals Entered By: Tora Perches (August 12, 2008 1:31 PM)                 Chief Complaint:  Multiple medical problems or concerns.  History of Present Illness: The patient presents for a follow up of cervical  pain, anxiety, depression and headaches. 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   Nose:     External nasal examination shows no deformity or inflammation. Nasal mucosa are pink and moist without lesions or exudates. Mouth:     Oral mucosa and oropharynx without lesions or exudates.  Teeth in good repair. Neck:     Cervical spine is tender to palpation over paraspinal muscles and with the ROM  Lungs:     Normal respiratory effort, chest expands symmetrically. Lungs are clear to auscultation, no crackles or wheezes. Heart:     Normal rate and regular rhythm.  S1 and S2 normal without gallop, murmur, click, rub or other extra sounds. Abdomen:     Bowel sounds positive,abdomen soft and non-tender without masses, organomegaly or hernias noted. Msk:     Lumbar-sacral spine is tender to palpation over paraspinal muscles and painfull with the ROM  Extremities:     No clubbing, cyanosis, edema, or deformity noted with normal full range of motion of all joints.   Neurologic:     No cranial nerve deficits noted. Station and gait are normal. Plantar reflexes are down-going bilaterally. DTRs are symmetrical throughout. Sensory, motor and coordinative functions appear intact. Skin:     Intact without suspicious lesions or rashes Psych:     Oriented X3.      Impression & Recommendations:  Problem # 1:  NECK PAIN (ICD-723.1) Assessment: Improved  Her updated medication list for this problem includes:    Celebrex 200 Mg Caps (Celecoxib) .Marland Kitchen... As needed   Problem # 2:  LOW BACK PAIN (ICD-724.2)  Assessment: Unchanged  Her updated medication list for this problem includes:    Celebrex 200 Mg Caps (Celecoxib) .Marland Kitchen... As needed   Problem # 3:  ANXIETY (ICD-300.00)  Her updated medication list for this problem includes:    Valium 5 Mg Tabs (Diazepam) ..... Once daily as needed    Alprazolam 0.25 Mg Tabs (Alprazolam) .Marland Kitchen... 1 by mouth two times a day as needed anxiety   Problem # 4:  INGUINAL PAIN, RIGHT (ICD-789.09) poss MSK Assessment: New  Gyn cons Dr Lily Peer Pelvis MRI Ice  Her updated medication list for this problem includes:    Celebrex 200 Mg Caps (Celecoxib) .Marland Kitchen... As needed  Orders: Gynecologic Referral (Gyn)   Problem # 5:  OSTEOARTHRITIS (ICD-715.90)  Her updated medication list for this problem includes:    Celebrex 200 Mg Caps (Celecoxib) .Marland Kitchen... As needed   Problem # 6:  VITAMIN D DEFICIENCY (ICD-268.9) Risks of noncompliance with treatment discussed. Compliance encouraged.   Problem # 7:  ANEMIA, VITAMIN B12 DEFICIENCY  NEC (ICD-281.1) Risks of noncompliance with treatment discussed. Compliance encouraged.  Her updated medication list for this problem includes:    Vitamin B-12 1000 Mcg Tabs (Cyanocobalamin) .Marland Kitchen... 1 qd  Orders: Vit B12 1000 mcg (J3420) Admin of Therapeutic Inj  intramuscular or subcutaneous (16109)   Complete Medication List: 1)  Zolpidem Tartrate 10 Mg Tabs (Zolpidem tartrate) .... 1/2 or 1 by mouth at hs prn 2)  Vitamin B-12 1000 Mcg Tabs (Cyanocobalamin) .Marland Kitchen.. 1 qd 3)  Vitamin D3 1000 Unit Tabs (Cholecalciferol) .... 2 qd 4)  Valium 5 Mg Tabs (Diazepam) .... Once daily as needed 5)  Celebrex 200 Mg Caps (Celecoxib) .... As needed 6)  Alprazolam 0.25 Mg Tabs (Alprazolam) .Marland Kitchen.. 1 by mouth two times a day as needed anxiety 7)  Tamiflu 75 Mg Caps (Oseltamivir phosphate) .... Take one capsule by mouth twice a day   Patient Instructions: 1)  Please schedule a follow-up appointment in 2 months. 2)  BMP prior to visit, ICD-9: 3)  Lipid Panel prior to visit, ICD-9: 4)  TSH prior to visit, ICD-9: 5)  Vit D  268.9 6)  Vit B12   266.2   Prescriptions: TAMIFLU 75 MG CAPS (OSELTAMIVIR PHOSPHATE) Take one capsule by mouth twice a day  #10 x 0   Entered and Authorized by:   Tresa Garter MD   Signed by:   Tresa Garter MD on 08/12/2008   Method used:   Print then Give to Patient   RxID:   [id number] ALPRAZOLAM 0.25 MG TABS (ALPRAZOLAM) 1 by mouth two times a day as needed anxiety  #20 x 0   Entered and Authorized by:   Tresa Garter MD   Signed by:   Tresa Garter MD on 08/12/2008   Method used:   Print then Give to Patient   RxID:   [id number]    Medication Administration  Injection # 1:    Medication: Vit B12 1000 mcg    Diagnosis: ANEMIA, VITAMIN B12 DEFICIENCY NEC (ICD-281.1)    Route: IM    Site: R deltoid    Exp Date: 04/2010    Lot #: [id number]    Mfr: [employer]    Comments: given    Patient tolerated injection without  complications    Given by: Tora Perches (August 12, 2008 2:13 PM)  Orders Added: 1)  Est. Patient Level IV [84132] 2)  Vit B12 1000 mcg [J3420] 3)  Admin of Therapeutic  Inj  intramuscular or subcutaneous [96372] 4)  Gynecologic Referral [Gyn]

## 2010-09-02 NOTE — Progress Notes (Signed)
Summary: Gen ?  Phone Note Call from Patient   Caller: Patient Summary of Call: I informed pt of her xray results. 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 Please advise Initial call taken by: Lanier Prude, [facility]),  August 25, 2010 12:08 PM  Follow-up for Phone Call        Yes, she should  I do not think her back has anything  to do with her leg pains Follow-up by: Tresa Garter MD,  August 25, 2010 4:51 PM  Additional Follow-up for Phone Call Additional follow up Details #1::        pt informed  Additional Follow-up by: Lanier Prude, [facility]),  August 26, 2010 2:07 PM

## 2010-09-02 NOTE — Assessment & Plan Note (Signed)
Summary: LOWER BACK PAIN--DECLINED ANOTHER--D/T--STC   Vital Signs:  Patient profile:   70 year old female Height:      64 inches Weight:      187 pounds BMI:     32.21 Temp:     98.0 degrees F oral Pulse rate:   92 / minute Pulse rhythm:   regular Resp:     16 per minute BP sitting:   128 / 72  (left arm) Cuff size:   regular  Vitals Entered By: Lanier Prude, CMA(AAMA) (August 23, 2010 7:56 AM) CC: LBP Is Patient Diabetic? No   CC:  LBP.  History of Present Illness: C/o  episodes of severe L groin pain a year ago down to inner thigh 1 y ago - a few secs to a min She had another episode in R groin and down R inner thigh a couple wks ago. She saw Dr Lequita Halt and asked this question. No answer was given except for "L3". She started Primrose oil, tonic water.  Current Medications (verified): 1)  Zolpidem Tartrate 10 Mg  Tabs (Zolpidem Tartrate) .... 1/2 or 1 By Mouth At [facility] Prn 2)  Vitamin B-12 1000 Mcg  Tabs (Cyanocobalamin) .Marland Kitchen.. 1 Qd 3)  Vitamin D3 1000 Unit  Tabs (Cholecalciferol) .... 2 Qd 4)  Valium 5 Mg Tabs (Diazepam) .... Take Bid As Needed 5)  Celebrex 200 Mg Caps (Celecoxib) .... As Needed 6)  Alprazolam 0.25 Mg Tabs (Alprazolam) .Marland Kitchen.. 1 By Mouth Two Times A Day As Needed Anxiety For Flights Only 7)  Fish Oil 1000 Mg Caps (Omega-3 Fatty Acids) .... Two Times A Day 8)  Premarin 1.0gr .... 2 Times A Week 9)  Glucosamine Sulfate 750 Mg Caps (Glucosamine Sulfate) .... Once Daily 10)  Aleve 220 Mg Tabs (Naproxen Sodium) .... As Needed For Pain 11)  Caltrate 600 1500 Mg Tabs (Calcium Carbonate) .Marland Kitchen.. 1 Once Daily 12)  Phentermine Hcl 37.5 Mg Tabs (Phentermine Hcl) .Marland Kitchen.. 1 By Mouth Qam 13)  Evening Primrose Oil 1000 Mg Caps (Evening Primrose Oil) .Marland Kitchen.. 1 By Mouth Once Daily 14)  Magnesium 250 Mg Tabs (Magnesium) .Marland Kitchen.. 1 By Mouth Once Daily  Allergies (verified): 1)  Phentermine Hcl (Phentermine Hcl) 2)  Vytorin (Ezetimibe-Simvastatin) 3)  Percocet  (Oxycodone-Acetaminophen)  Past History:  Past Medical History: Last updated: 08/12/2008 Anxiety Hyperlipidemia Low back pain Osteoarthritis Vit D def Vit B12 def  Social History: Last updated: 05/02/2007 Retired Single Former Smoker Regular exercise-no  Review of Systems       The patient complains of difficulty walking.  The patient denies fever, weight loss, unusual weight change, and enlarged lymph nodes.    Physical Exam  General:  overweight-appearing.   Head:  Normocephalic and atraumatic without obvious abnormalities. No apparent alopecia or balding. Mouth:  Oral mucosa and oropharynx without lesions or exudates.  Teeth in good repair. Lungs:  Normal respiratory effort, chest expands symmetrically. Lungs are clear to auscultation, no crackles or wheezes. Heart:  Normal rate and regular rhythm. S1 and S2 normal without gallop, murmur, click, rub or other extra sounds. Abdomen:  Bowel sounds positive,abdomen soft and non-tender without masses, organomegaly or hernias noted. Msk:  Lumbar-sacral spine is tender to palpation over paraspinal muscles a little and painfull with the ROM  R hip is tender w/ROM Walking w/o a cane B inner thighs are very tender to palpation along adductor muscles Neurologic:  No cranial nerve deficits noted. Station and gait are normal. Plantar reflexes are down-going bilaterally. DTRs are  symmetrical throughout. Sensory, motor and coordinative functions appear intact. Skin:  Intact without suspicious lesions or rashes Very mild alopecia on top of the scalp Psych:  Oriented X3.     Impression & Recommendations:  Problem # 1:  HIP PAIN (ICD-719.45) Assessment Improved  Her updated medication list for this problem includes:    Celebrex 200 Mg Caps (Celecoxib) .Marland Kitchen... As needed    Aleve 220 Mg Tabs (Naproxen sodium) .Marland Kitchen... As needed for pain  Problem # 2:  GROIN PAIN (ICD-789.09) R and L inner thighs -along gracillis mussles Assessment:  New Sports med consult, PT was offered. She will think about it. Her updated medication list for this problem includes:   Start Celebrex 200 Mg Caps (Celecoxib) .Marland KitchenMarland KitchenMarland Kitchen. x 2 wks. Stretch.    Orders: T-Lumbar Spine 2 Views (72100TC)  Problem # 3:  LOW BACK PAIN, CHRONIC (ICD-724.2) Assessment: Unchanged  Her updated medication list for this problem includes:    Celebrex 200 Mg Caps (Celecoxib) .Marland Kitchen... As needed    Aleve 220 Mg Tabs (Naproxen sodium) .Marland Kitchen... As needed for pain  Orders: T-Lumbar Spine 2 Views (72100TC)  Complete Medication List: 1)  Zolpidem Tartrate 10 Mg Tabs (Zolpidem tartrate) .... 1/2 or 1 by mouth at hs prn 2)  Vitamin B-12 1000 Mcg Tabs (Cyanocobalamin) .Marland Kitchen.. 1 qd 3)  Vitamin D3 1000 Unit Tabs (Cholecalciferol) .... 2 qd 4)  Valium 5 Mg Tabs (Diazepam) .... Take bid as needed 5)  Celebrex 200 Mg Caps (Celecoxib) .... As needed 6)  Alprazolam 0.25 Mg Tabs (Alprazolam) .Marland Kitchen.. 1 by mouth two times a day as needed anxiety for flights only 7)  Fish Oil 1000 Mg Caps (Omega-3 fatty acids) .... Two times a day 8)  Premarin 1.0gr  .... 2 times a week 9)  Glucosamine Sulfate 750 Mg Caps (Glucosamine sulfate) .... Once daily 10)  Aleve 220 Mg Tabs (Naproxen sodium) .... As needed for pain 11)  Caltrate 600 1500 Mg Tabs (Calcium carbonate) .Marland Kitchen.. 1 once daily 12)  Phentermine Hcl 37.5 Mg Tabs (Phentermine hcl) .Marland Kitchen.. 1 by mouth qam 13)  Evening Primrose Oil 1000 Mg Caps (Evening primrose oil) .Marland Kitchen.. 1 by mouth once daily 14)  Magnesium 250 Mg Tabs (Magnesium) .Marland Kitchen.. 1 by mouth once daily  Patient Instructions: 1)  Please schedule a follow-up appointment in 1 month. 2)  Use stretching and  that I have provided (15 min. or longer every day)    Orders Added: 1)  T-Lumbar Spine 2 Views [72100TC] 2)  Est. Patient Level IV [57846]

## 2010-09-02 NOTE — Progress Notes (Signed)
  Phone Note Other Incoming   Request: Send information Summary of Call: Mailed copy of patient's xray to her and D.C. per medical release request.

## 2010-09-28 ENCOUNTER — Ambulatory Visit (INDEPENDENT_AMBULATORY_CARE_PROVIDER_SITE_OTHER): Payer: Medicare Other | Admitting: Internal Medicine

## 2010-09-28 ENCOUNTER — Encounter: Payer: Self-pay | Admitting: Internal Medicine

## 2010-09-28 DIAGNOSIS — M545 Low back pain, unspecified: Secondary | ICD-10-CM

## 2010-09-28 DIAGNOSIS — R209 Unspecified disturbances of skin sensation: Secondary | ICD-10-CM | POA: Insufficient documentation

## 2010-09-28 DIAGNOSIS — E669 Obesity, unspecified: Secondary | ICD-10-CM

## 2010-09-28 DIAGNOSIS — R109 Unspecified abdominal pain: Secondary | ICD-10-CM

## 2010-10-07 NOTE — Assessment & Plan Note (Signed)
Summary: 1 MO ROV /[facility]   Vital Signs:  Patient profile:   70 year old female Height:      64 inches (162.56 cm) Weight:      186 pounds (84.55 kg) BMI:     32.04 O2 Sat:      96 % on Room air Temp:     97.9 degrees F (36.61 degrees C) oral Pulse rate:   86 / minute Resp:     16 per minute BP sitting:   118 / 76  (left arm) Cuff size:   regular  Vitals Entered By: Burnard Leigh CMA(AAMA) (September 28, 2010 10:01 AM)  O2 Flow:  Room air CC: F/U on lower back pain.Pt c/o of tingling in Left cheek x2 days/sls,cma Is Patient Diabetic? No Comments Pt has Phentermine on Med & Allergy list   CC:  F/U on lower back pain.Pt c/o of tingling in Left cheek x2 days/sls and cma.  History of Present Illness: C/o LBP and hip pains - better C/o tingling sensation near L upper lip tingling constant in a relaxed state F/u wt gain  Current Medications (verified): 1)  Zolpidem Tartrate 10 Mg  Tabs (Zolpidem Tartrate) .... 1/2 or 1 By Mouth At [facility] Prn 2)  Vitamin B-12 1000 Mcg  Tabs (Cyanocobalamin) .Marland Kitchen.. 1 Qd 3)  Vitamin D3 1000 Unit  Tabs (Cholecalciferol) .... 2 Qd 4)  Valium 5 Mg Tabs (Diazepam) .... Take Bid As Needed 5)  Celebrex 200 Mg Caps (Celecoxib) .... As Needed 6)  Alprazolam 0.25 Mg Tabs (Alprazolam) .Marland Kitchen.. 1 By Mouth Two Times A Day As Needed Anxiety For Flights Only 7)  Fish Oil 1000 Mg Caps (Omega-3 Fatty Acids) .... Two Times A Day 8)  Premarin 1.0gr .... 2 Times A Week 9)  Glucosamine Sulfate 750 Mg Caps (Glucosamine Sulfate) .... Once Daily 10)  Aleve 220 Mg Tabs (Naproxen Sodium) .... As Needed For Pain 11)  Caltrate 600 1500 Mg Tabs (Calcium Carbonate) .Marland Kitchen.. 1 Once Daily 12)  Phentermine Hcl 37.5 Mg Tabs (Phentermine Hcl) .Marland Kitchen.. 1 By Mouth Qam 13)  Evening Primrose Oil 1000 Mg Caps (Evening Primrose Oil) .Marland Kitchen.. 1 By Mouth Once Daily 14)  Magnesium 250 Mg Tabs (Magnesium) .Marland Kitchen.. 1 By Mouth Once Daily  Allergies (verified): 1)  Phentermine Hcl (Phentermine Hcl) 2)  Vytorin  (Ezetimibe-Simvastatin) 3)  Percocet (Oxycodone-Acetaminophen)  Past History:  Past Medical History: Last updated: 08/12/2008 Anxiety Hyperlipidemia Low back pain Osteoarthritis Vit D def Vit B12 def  Social History: Last updated: 05/02/2007 Retired Single Former Smoker Regular exercise-no  Review of Systems  The patient denies weight loss, weight gain, dyspnea on exertion, abdominal pain, and melena.    Physical Exam  General:  overweight-appearing.   Nose:  External nasal examination shows no deformity or inflammation. Nasal mucosa are pink and moist without lesions or exudates. Mouth:  Oral mucosa and oropharynx without lesions or exudates.  Teeth in good repair. Neck:  No deformities, masses, or tenderness noted. Abdomen:  Bowel sounds positive,abdomen soft and non-tender without masses, organomegaly or hernias noted. Msk:  Lumbar-sacral spine is less  tender to palpation over paraspinal muscles a little and painfull with the ROM  R hip is less tender w/ROM Walking w/o a cane B inner thighs are less tender to palpation along adductor muscles Pulses:  R and L carotid,radial,femoral,dorsalis pedis and posterior tibial pulses are full and equal bilaterally Neurologic:  No cranial nerve deficits noted. Station and gait are normal. Plantar reflexes are down-going bilaterally.  DTRs are symmetrical throughout. Sensory, motor and coordinative functions appear intact. Skin:  Intact without suspicious lesions or rashes Very mild alopecia on top of the scalp Psych:  Oriented X3.     Impression & Recommendations:  Problem # 1:  LOW BACK PAIN, CHRONIC (ICD-724.2) Assessment Improved  Her updated medication list for this problem includes:    Celebrex 200 Mg Caps (Celecoxib) .Marland Kitchen... As needed    Aleve 220 Mg Tabs (Naproxen sodium) .Marland Kitchen... As needed for pain  Problem # 2:  GROIN PAIN (ICD-789.09) Assessment: Improved  Her updated medication list for this problem includes:     Celebrex 200 Mg Caps (Celecoxib) .Marland Kitchen... As needed    Aleve 220 Mg Tabs (Naproxen sodium) .Marland Kitchen... As needed for pain  Problem # 3:  PARESTHESIA (ICD-782.0) local L face  ? H simplex prodrome vs other Assessment: New Will watch  Problem # 4:  OBESITY (ICD-278.00) Assessment: Improved Phentermine OK to cont if tol  Complete Medication List: 1)  Zolpidem Tartrate 10 Mg Tabs (Zolpidem tartrate) .... 1/2 or 1 by mouth at hs prn 2)  Vitamin B-12 1000 Mcg Tabs (Cyanocobalamin) .Marland Kitchen.. 1 qd 3)  Vitamin D3 1000 Unit Tabs (Cholecalciferol) .... 2 qd 4)  Valium 5 Mg Tabs (Diazepam) .... Take bid as needed 5)  Celebrex 200 Mg Caps (Celecoxib) .... As needed 6)  Alprazolam 0.25 Mg Tabs (Alprazolam) .Marland Kitchen.. 1 by mouth two times a day as needed anxiety for flights only 7)  Fish Oil 1000 Mg Caps (Omega-3 fatty acids) .... Two times a day 8)  Premarin 1.0gr  .... 2 times a week 9)  Glucosamine Sulfate 750 Mg Caps (Glucosamine sulfate) .... Once daily 10)  Aleve 220 Mg Tabs (Naproxen sodium) .... As needed for pain 11)  Caltrate 600 1500 Mg Tabs (Calcium carbonate) .Marland Kitchen.. 1 once daily 12)  Phentermine Hcl 37.5 Mg Tabs (Phentermine hcl) .Marland Kitchen.. 1 by mouth qam 13)  Evening Primrose Oil 1000 Mg Caps (Evening primrose oil) .Marland Kitchen.. 1 by mouth once daily 14)  Magnesium 250 Mg Tabs (Magnesium) .Marland Kitchen.. 1 by mouth once daily  Patient Instructions: 1)  Please schedule a follow-up appointment in 3 months. Prescriptions: PHENTERMINE HCL 37.5 MG TABS (PHENTERMINE HCL) 1 by mouth qam  #30 x 2   Entered and Authorized by:   Tresa Garter MD   Signed by:   Tresa Garter MD on 09/28/2010   Method used:   Print then Give to Patient   RxID:   [id number]    Orders Added: 1)  Est. Patient Level IV [14782]

## 2010-10-17 LAB — COMPREHENSIVE METABOLIC PANEL
ALT: 23 U/L (ref 0–35)
AST: 20 U/L (ref 0–37)
Albumin: 4.3 g/dL (ref 3.5–5.2)
Alkaline Phosphatase: 78 U/L (ref 39–117)
BUN: 10 mg/dL (ref 6–23)
CO2: 28 mEq/L (ref 19–32)
Calcium: 9.3 mg/dL (ref 8.4–10.5)
Chloride: 103 mEq/L (ref 96–112)
Creatinine, Ser: 0.64 mg/dL (ref 0.4–1.2)
GFR calc Af Amer: >60 mL/min (ref >60)
GFR calc non Af Amer: >60 mL/min (ref >60)
Glucose, Bld: 101 mg/dL — ABNORMAL HIGH (ref 70–99)
Potassium: 3.9 mEq/L (ref 3.5–5.1)
Sodium: 139 mEq/L (ref 135–145)
Total Bilirubin: 0.6 mg/dL (ref 0.3–1.2)
Total Protein: 7.5 g/dL (ref 6.0–8.3)

## 2010-10-17 LAB — CBC
HCT: 26.9 % — ABNORMAL LOW (ref 36.0–46.0)
HCT: 28.1 % — ABNORMAL LOW (ref 36.0–46.0)
HCT: 30.5 % — ABNORMAL LOW (ref 36.0–46.0)
HCT: 41.3 % (ref 36.0–46.0)
Hemoglobin: 10.3 g/dL — ABNORMAL LOW (ref 12.0–15.0)
Hemoglobin: 14 g/dL (ref 12.0–15.0)
Hemoglobin: 8.9 g/dL — ABNORMAL LOW (ref 12.0–15.0)
Hemoglobin: 9.5 g/dL — ABNORMAL LOW (ref 12.0–15.0)
MCHC: 33.3 g/dL (ref 30.0–36.0)
MCHC: 33.7 g/dL (ref 30.0–36.0)
MCHC: 33.7 g/dL (ref 30.0–36.0)
MCHC: 33.9 g/dL (ref 30.0–36.0)
MCV: 95.8 fL (ref 78.0–100.0)
MCV: 96.2 fL (ref 78.0–100.0)
MCV: 96.8 fL (ref 78.0–100.0)
MCV: 97.6 fL (ref 78.0–100.0)
Platelets: 208 10*3/uL (ref 150–400)
Platelets: 227 10*3/uL (ref 150–400)
Platelets: 263 10*3/uL (ref 150–400)
Platelets: 285 10*3/uL (ref 150–400)
RBC: 2.79 MIL/uL — ABNORMAL LOW (ref 3.87–5.11)
RBC: 2.9 MIL/uL — ABNORMAL LOW (ref 3.87–5.11)
RBC: 3.13 MIL/uL — ABNORMAL LOW (ref 3.87–5.11)
RBC: 4.31 MIL/uL (ref 3.87–5.11)
RDW: 13.5 % (ref 11.5–15.5)
RDW: 13.7 % (ref 11.5–15.5)
RDW: 13.9 % (ref 11.5–15.5)
RDW: 14 % (ref 11.5–15.5)
WBC: 10.5 10*3/uL (ref 4.0–10.5)
WBC: 11.2 10*3/uL — ABNORMAL HIGH (ref 4.0–10.5)
WBC: 12 10*3/uL — ABNORMAL HIGH (ref 4.0–10.5)
WBC: 7.5 10*3/uL (ref 4.0–10.5)

## 2010-10-17 LAB — PROTIME-INR
INR: 1.03 (ref 0.00–1.49)
INR: 1.19 (ref 0.00–1.49)
INR: 1.7 — ABNORMAL HIGH (ref 0.00–1.49)
INR: 1.76 — ABNORMAL HIGH (ref 0.00–1.49)
Prothrombin Time: 13.4 seconds (ref 11.6–15.2)
Prothrombin Time: 15 seconds (ref 11.6–15.2)
Prothrombin Time: 19.8 seconds — ABNORMAL HIGH (ref 11.6–15.2)
Prothrombin Time: 20.4 seconds — ABNORMAL HIGH (ref 11.6–15.2)

## 2010-10-17 LAB — BASIC METABOLIC PANEL
BUN: 11 mg/dL (ref 6–23)
BUN: 4 mg/dL — ABNORMAL LOW (ref 6–23)
CO2: 26 mEq/L (ref 19–32)
CO2: 28 mEq/L (ref 19–32)
Calcium: 8.3 mg/dL — ABNORMAL LOW (ref 8.4–10.5)
Calcium: 8.5 mg/dL (ref 8.4–10.5)
Chloride: 100 mEq/L (ref 96–112)
Chloride: 106 mEq/L (ref 96–112)
Creatinine, Ser: 0.67 mg/dL (ref 0.4–1.2)
Creatinine, Ser: 0.68 mg/dL (ref 0.4–1.2)
GFR calc Af Amer: >60 mL/min (ref >60)
GFR calc Af Amer: >60 mL/min (ref >60)
GFR calc non Af Amer: >60 mL/min (ref >60)
GFR calc non Af Amer: >60 mL/min (ref >60)
Glucose, Bld: 107 mg/dL — ABNORMAL HIGH (ref 70–99)
Glucose, Bld: 148 mg/dL — ABNORMAL HIGH (ref 70–99)
Potassium: 4.1 mEq/L (ref 3.5–5.1)
Potassium: 4.7 mEq/L (ref 3.5–5.1)
Sodium: 132 mEq/L — ABNORMAL LOW (ref 135–145)
Sodium: 139 mEq/L (ref 135–145)

## 2010-10-17 LAB — URINALYSIS, ROUTINE W REFLEX MICROSCOPIC
Bilirubin Urine: NEGATIVE
Glucose, UA: NEGATIVE mg/dL
Hgb urine dipstick: NEGATIVE
Ketones, ur: NEGATIVE mg/dL
Nitrite: NEGATIVE
Protein, ur: NEGATIVE mg/dL
Specific Gravity, Urine: 1.017 (ref 1.005–1.030)
Urobilinogen, UA: 0.2 mg/dL (ref 0.0–1.0)
pH: 5.5 (ref 5.0–8.0)

## 2010-10-17 LAB — HEMOGLOBIN AND HEMATOCRIT, BLOOD
HCT: 35.1 % — ABNORMAL LOW (ref 36.0–46.0)
Hemoglobin: 12.3 g/dL (ref 12.0–15.0)

## 2010-10-17 LAB — TYPE AND SCREEN
ABO/RH(D): A NEG
Antibody Screen: NEGATIVE

## 2010-10-17 LAB — APTT: aPTT: 32 seconds (ref 24–37)

## 2010-12-14 NOTE — H&P (Signed)
NAMEGREGORIA, SELVY             ACCOUNT NO.:  [id number]   MEDICAL RECORD NO.:  [id number]          PATIENT TYPE:  INP   LOCATION:  3022                         FACILITY:  [facility]   PHYSICIAN:  Coletta Memos, M.D.     DATE OF BIRTH:  [birth year]   DATE OF ADMISSION:  05/04/2008  DATE OF DISCHARGE:                              HISTORY & PHYSICAL   ADDENDUM   REVIEW OF SYSTEMS:  Positive for neck pain and arm pain when she tries  to push up on her arms.   PHYSICAL EXAMINATION:  GENERAL:  She is alert, oriented x4, and  answering all questions appropriately.  Memory, language, attention  span, and fund of knowledge are normal.  She is well kempt and in no  significant distress.  EXTREMITIES:  She has 5/5 strength in the upper and lower extremities.  Normal coordination in the upper and lower extremities.  No cervical  masses.  Pulses are good at the wrist bilaterally.  LUNGS:  Lung fields are clear.  HEART:  Regular rhythm and rate.   Ms. Miedema is in a great deal of distress.  She cries out when the  wheelchair that she was in was taken over a slight bump.  She will be  admitted for pain control.  No neurologic deficits are appreciated.  I  do believe this is postoperative pain.  Obviously, if there is any other  type of change or other abnormality, further studies can be done.  Wound  is clean and dry.           ______________________________  Coletta Memos, M.D.     KC/MEDQ  D:  05/04/2008  T:  05/05/2008  Job:  [id number]

## 2010-12-14 NOTE — Discharge Summary (Signed)
[name], HARMON             ACCOUNT NO.:  [id number]   MEDICAL RECORD NO.:  [id number]          PATIENT TYPE:  INP   LOCATION:  3022                         FACILITY:  [facility]   PHYSICIAN:  Stefani Dama, M.D.  DATE OF BIRTH:  [birth year]   DATE OF ADMISSION:  05/04/2008  DATE OF DISCHARGE:  05/08/2008                               DISCHARGE SUMMARY   ADMITTING DIAGNOSIS:  Intractable neck pain, postop cervical  foraminotomy C3-4 that was done on May 01, 2008.  She had been  discharged home and had intractable neck pain and was seen in emergency  room and readmitted for pain control and observation.   DISCHARGE DIAGNOSIS:  Intractable neck pain, postop cervical  foraminotomy C3-4 that was done on May 01, 2008.  She had been  discharged home and had intractable neck pain and was seen in emergency  room and readmitted for pain control and observation.   HOSPITAL COURSE:  The patient was admitted on May 04, 2008.  She was  placed on PCA morphine pump, Valium for muscle spasms, and a CT of her  cervical spine was obtained.  The CT scan showed some postsurgical fluid  collection, no cord compression, and no abnormalities other than post  surgical changes.  Reassurance was given to the patient.  She made very  slow progress during her hospitalization, continued to have severe pain,  and Toradol was also used for pain control.  She was weaned off her PCA  morphine pump to p.o. Percocet.  Continued with Valium for muscle  relaxation and Toradol was discontinued.  Her Celebrex was then  increased to 200 mg b.i.d. and she was having an inflammatory response.  She did run a fever on May 06, 2008, encouraged on mobilization,  incentive spirometry, and deep breathing.  Increased fluid intake.  Urinalysis was obtained and this was negative.  Chest x-ray showed  decreased atelectasis.  She was ready for discharge home on May 08, 2008, eating well, voiding well, feeling much  better, markedly improved  from her admission day.   DISCHARGE INSTRUCTIONS:  Discharge home.  Given prescription for  Celebrex 200 mg b.i.d. for 1 week and then once a day thereafter,  Percocet May 5/325 one p.o. q.4-6 h. p.r.n. pain #4 tablets a day take  cautiously, weaned off of this to the hydrocodone, which she already has  a prescription at home.  She will continue on Valium, which she has at  home.  She will follow up with our office with Dr. Danielle Dess in 2-3 weeks.  She will contact our office prior to follow up if she has any problems,  concerns, or questions.  Continue with neck precautions.  Continue with  soft collar while ambulating.  She can come out of it for comfort as  needed.  No driving for 2 weeks.  Regular diet.  She may shower.  Her  wound remained benign during hospitalizations.      Aura Fey Bobbe Medico.      Stefani Dama, M.D.  Electronically Signed    SCI/MEDQ  D:  05/08/2008  T:  05/08/2008  Job:  [id number]

## 2010-12-14 NOTE — H&P (Signed)
[name], [name]             ACCOUNT NO.:  [id number]   MEDICAL RECORD NO.:  [id number]          PATIENT TYPE:  INP   LOCATION:  3022                         FACILITY:  [facility]   PHYSICIAN:  Coletta Memos, M.D.     DATE OF BIRTH:  1940-11-27   DATE OF ADMISSION:  05/04/2008  DATE OF DISCHARGE:                              HISTORY & PHYSICAL   INDICATIONS:  [name] is a 70 year old woman who presented to Dr.  Barnett Abu with left-sided cervical radicular problems in the C4  distribution.  She was found to have substantial problems with neck  pain, also shoulder pain on the left side.  MRI suggested a disk on the  left side at C3-4 and also significant foraminal narrowing present.  Dr.  Danielle Dess informed her of her options, and she opted for surgical  decompression.  He took her to the operating room on May 01, 2008,  where she underwent a C3-4 cervical laminectomy and foraminotomy in a  sitting position.  Postoperatively, [name] was doing quite well.  She was without pain and quite comfortable.  Dr. Danielle Dess did not find a  disk herniation there, but he did note that the C4 root was rather snug  in its transition through the foramen.  No disk fragment was  appreciated.  She started feeling ill on May 03, 2008, she says, just  after she left the hospital.  She was having some spasms in her neck.  On May 02, 2008, she contacted Clista Bernhardt and was informed that  it sounded as if she was having severe muscle spasms in the back of her  neck.  She was given muscle relaxants and told to take those.  I was  called this morning on three separate occasions, one by [name]  and two by friends who were attending to her, that she was in  extraordinary discomfort.  Ledlow was in tears when she contacted me  earlier this morning.  She did not report any neurologic deficits,  stating she simply had too much pain to move.  She says whenever her  feet would hit the  floor her neck would hurt.  She could not move from  the chair and therefore there really was nothing that she could do.  I  offered her admission at that time,  and again she reiterated that since  she could not move she did not know what she could do.  She and I spoke,  and she finally agreed to have a friend pick up a prescription for  Percocet to see if this would help her.  That friend called me stating  that [name] would still need to be admitted because she was just  so miserable and she was living by herself.  [name] was then  brought to emergency room by another friend, who contacted me.  She is  now admitted for pain control.  She has no neurologic deficits.   PAST MEDICAL HISTORY:  1. Chronic neck pain.  2. Chronic low back pain.  3. Anxiety.  4. Insomnia.  5. Gastroesophageal reflux.  6. Dyslipidemia.  7. Diverticulosis.  8. Hysterectomy.  9. Breast reduction.   PAST SURGICAL HISTORY:  Posterior cervical foraminotomy, previous  cervical surgery in 2000, and a left hip replacement performed in 2007  by Dr. Lequita Halt.  She has also undergone a hysterectomy and dilatation  and curettage.   FAMILY HISTORY:  Positive for coronary artery disease and cancer.   SOCIAL HISTORY:  [name] is single, lives by herself.  She does  not smoke.  She uses alcohol very infrequently.   ALLERGIES:  She has no known drug allergies.           ______________________________  Coletta Memos, M.D.     KC/MEDQ  D:  05/04/2008  T:  05/05/2008  Job:  [id number]

## 2010-12-14 NOTE — Op Note (Signed)
[name], [name]             ACCOUNT NO.:  [id number]   MEDICAL RECORD NO.:  [id number]          PATIENT TYPE:  INP   LOCATION:  2899                         FACILITY:  [facility]   PHYSICIAN:  Stefani Dama, M.D.  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   PROCEDURE:  Cervical laminotomy and foraminotomy C3-C4 left with  operating microscope microdissection technique, sitting position.   SURGEON:  Stefani Dama, MD   FIRST ASSISTANT:  Clydene Fake, MD   ANESTHESIA:  General endotracheal.   INDICATIONS:  [name] is a 70 year old individual who has had  significant left-sided cervical radicular symptoms in the C4  distribution.  She was found to have substantial problems with neck  pain, shoulder pain on that left side and she was advised regarding  surgical decompression via laminotomy and foraminotomy at C3-4 on the  left side.   PROCEDURE:  The patient was brought to the operating room supine on the  stretcher.  After smooth induction of general endotracheal anesthesia  and placement of appropriate central venous monitoring lines and in  addition to an arterial line, she was placed in a three-pin headrest  then gently put into a sitting position.  The back of the neck was  prepped with alcohol and DuraPrep and radiographic localization of C3-C4  was obtained.  On the left side then incision was made in the skin  measuring approximately 10 mm in length.  K-wire was passed through this  incision down to the level of the interlaminar space at C3-C4 on the  left side.  Then using a winding technique, a series of dilators  were  passed to the 18 mm size and 18 mm wide x 5 cm deep cannula was affixed  to the operating table with a clamp.  Then via this aperture, the fascia  was opened over the C3-C4 interspace and laminotomy was created using a  high-speed drill to remove the inferior margin lamina of C3 and superior  margin lamina of C4.  At the junction of the facet partial facetectomy  was then performed drilling off thickened bone and redundant fibrous  tissue uncovering the C4 nerve root.  C4 nerve root was noted to be  rather snug in its transition through the foramen and this allowed for  ample decompression of the C4 nerve root itself.  Then when the nerve  was fully skeletonized on the undersurface, we palpated with a small  nerve hook to see if there was any fragments of disk.  There was noted  to be a fairly firm ridge of the ligament over the disk space at the C3-  C4 level.  Further inspection of this area yielded no fragments of disk  material, however, it was felt that the C4 nerve root was well  decompressed.  Hemostasis in the surrounding area from plethoric  epidural veins was obtained with some bipolar cautery and then some  small pledgets of Gelfoam soaked in thrombin which tamponaded the  bleeding and were later irrigated away.  Once we had good decompression  of the nerve and hemostasis was obtained, microscope was withdrawn and  then the wound  was closed with 3-0 Vicryl in the fascia and 3-0 Vicryl in the  subcuticular tissues and a dry dressing on the skin.  The patient was  removed from the three-pin headrest carefully and then allowed to be  awakened and extubated.  Blood loss for the procedure was estimated at  less than 10 mL.      Stefani Dama, M.D.  Electronically Signed     HJE/MEDQ  D:  05/01/2008  T:  05/01/2008  Job:  [id number]

## 2010-12-17 NOTE — Assessment & Plan Note (Signed)
[facility]                                 ON-CALL NOTE   NAME:[name], [name]                    MRN:          [id number]  DATE:09/03/2006                            DOB:          Jun 19, 1941    PHONE NUMBER:  [phone]   OBJECTIVE:  The patient has seen blood in her urine this evening.  She  started with polyuria and minimal urination this afternoon, actually  left a Super Bowl party because she started seeing signs of blood in the  toilet bowl after she would urinate.  Has no pain so far and no fever at  all.  As far as she knows, her symptoms started this afternoon.   ASSESSMENT:  Probable UTI.   PLAN:  Sounds like she can wait tomorrow morning, but call first thing  for an appointment to be seen to be started on antibiotics.  Go to the  emergency room if symptoms worsen.   PRIMARY CARE Alysha Doolan:  Dr[facility] P[facility]y Rea at [facility].     Arta Silence, MD  Electronically Signed    RNS/MedQ  DD: 09/03/2006  DT: 09/04/2006  Job #: [id number]

## 2010-12-17 NOTE — Discharge Summary (Signed)
NAMESCARLETH, BRAME             ACCOUNT NO.:  [id number]   MEDICAL RECORD NO.:  [id number]          PATIENT TYPE:  OBV   LOCATION:  0449                         FACILITY:  [facility]   PHYSICIAN:  Almedia Balls. Fore, M.D.   DATE OF BIRTH:  [birth year]   DATE OF ADMISSION:  05/20/2004  DATE OF DISCHARGE:  05/21/2004                                 DISCHARGE SUMMARY   HISTORY:  The patient is a 70 year old with abnormal uterine bleeding -  postmenopausal - pelvic pain, uterine enlargement, for hysterectomy, left  salpingo-oophorectomy on May 20, 2004.  The remainder of her history and  physical are as previously dictated.   LABORATORY DATA:  Preoperative hemoglobin 14.  Electrocardiogram showed  sinus tachycardia, inferior infarct, age undetermined.   HOSPITAL COURSE:  The patient was taken to the operating room on May 20, 2004, at which time abdominal supracervical hysterectomy, left salpingo-  oophorectomy, and extensive enterolysis were performed.  The patient did  well postoperatively.  Diet and ambulation were progressed over the evening  of May 20, 2004 and early morning of May 21, 2004.  On the morning  of May 21, 2004, she was afebrile, up and voiding, and experiencing no  problems except for pain, which was controlled by oral analgesics.  It was  felt that she could be discharged at this time.   FINAL DIAGNOSES:  1.  Abnormal postmenopausal bleeding.  2.  Uterine enlargement.  3.  Pelvic pain.  4.  Pelvic and abdominal adhesions.   OPERATION:  Abdominal supracervical hysterectomy, left salpingo-  oophorectomy, extensive enterolysis.  Pathology report unavailable at the  time of dictation.   DISPOSITION:  Discharged home, to return to the office in two weeks for  followup.  She was instructed to call if heavy bleeding, pain, or  unexplained fevers should ensue.  She was given a prescription for Dilaudid  generic 2 mg, #30, to be taken 1-2 q.4-6 h. p.r.n.  pain; and doxycycline 100  mg, #12, to be taken one b.i.d.      SRF/MEDQ  D:  05/21/2004  T:  05/21/2004  Job:  [id number]

## 2010-12-17 NOTE — Discharge Summary (Signed)
NAMECHRISTIA, DOMKE             ACCOUNT NO.:  [id number]   MEDICAL RECORD NO.:  [id number]          PATIENT TYPE:  INP   LOCATION:  1508                         [facility]ACILITY:  [facility]   PHYSICIAN:  Ollen Gross, M.D.    DATE OF BIRTH:  10-27-1940   DATE OF ADMISSION:  09/26/2005  DATE OF DISCHARGE:  09/29/2005                                 DISCHARGE SUMMARY   ADMISSION DIAGNOSES:  1.  Osteoarthritis, left hip.  2.  Hypercholesterolemia.  3.  Reflux disease.  4.  Diverticulosis.  5.  Hemorrhoids.  6.  Cervical degenerative disk disease.  7.  Postmenopausal.   DISCHARGE DIAGNOSES:  1.  Osteoarthritis, left hip, status post left total hip arthroplasty.  2.  Postoperative blood loss anemia, did not require transfusion.  3.  Hypercholesterolemia.  4.  Reflux disease.  5.  Diverticulosis.  6.  Hemorrhoids.  7.  Cervical degenerative disk disease.  8.  Postmenopausal.   PROCEDURE:  On September 26, 2005, left total hip.   SURGEON:  Ollen Gross, M.D.   ASSISTANT:  Alexzandrew L. Julien Girt, P.A.-C.   CONSULTS:  Rehab services.   BRIEF HISTORY:  [name] is a 70 year old female with end-stage  arthritis of the left hip with intractable pain.  She has failed all  nonoperative management, including injections, and now presents for total  hip.   LABORATORY DATA:  Preop CBC:  Hemoglobin 14, hematocrit 41.3.  White count  normal at 6.7.  Postop hemoglobin down to 10.1, drifted down to 9.2, came  back up a little bit and was last noted at 9.3 with a crit of 27.  PT/PTT 14  and 34, respectively on admission, with an INR of 1.1.  Serial pro times  followed:  Last noted PT/INR 18.5 and 1.5.  Chem panel on admission:  Mildly  elevated glucose of 110.  The remaining chem panel all within normal limits.  Serial BMETs are followed.  Electrolytes remained within normal limits.  Glucose went up from 110 to 133, back down to 109.  Preop UA negative.  Blood group type A-.   EKG dated  September 20, 2005:  Normal sinus rhythm.  Possible left atrial  enlargement, unconfirmed.   Left hip films, September 20, 2005:  Severe osteoarthritis of the left hip  with complete joint space loss and hypertrophic spurring.  Moderate OA of  the right hip.   Hip films and pelvis film on September 26, 2005:  Status post left total hip  without complications.   HOSPITAL COURSE:  Patient was admitted to [facility], tolerated  the procedure well, and was later transferred to the recovery room and then  to the orthopedic floor.  Started on PCA and p.o. analgesics for pain  control following surgery.  Seen in rounds.  Did have some discomfort but  actually was doing fairly well for day #1.  Wanted to look into rehab.  A  rehab consult was called.  Patient was seen in evaluation by the rehab  services.  Felt that she would need SACU therapy.  Hemoglobin was 10.  Only  needed  her knee immobilizer and brace at night.  She did have excellent  urinary output following surgery.   By day #2, she had a little bit of temp of 101.3.  She had a rough morning.  Her PCA had been stopped around 4:00 a.m. but because her IV had to be  switched, it was not restarted.  The patient did not ask or offered p.o.  pain meds until around 7:15, so she went over about three hours without  medication.  Once she got the pain medications back, her pain had improved,  and her pulse rate, which had gone up to 119, had also improved.  Dressing  change, incision looked good.  Worked on the pain control on day #2.  She  actually got up and walked 45 feet with physical therapy.  Did much better  once she had her pain medications dispensed.  It was painful, went up, but  her groin pain had improved.  It was noted on September 29, 2005 that a bed  became available in the rehab unit, and it was felt that due to her slow  progress that she would be an excellent candidate.  A SACU bed opened up.  The patient was in  agreement, and she was transferred at that time.   DISCHARGE PLAN:  1.  Patient was transferred over to [facility].  2.  Discharge diagnoses:  Please see above.  3.  Discharge meds:  Continue current medications as per the [facility].  4.  Diet:  Continue current diet.  5.  Activity, 25-50% partial weightbearing to the left lower extremity.  Hip      precautions at all times.  Total hip protocol.  PT/OT for gait training      ambulation, ADLs.  May start showering.  Walker for ambulation.  6.  Follow up two weeks from surgery or following discharge from [facility].   DISPOSITION:  [facility] SACU.   CONDITION ON DISCHARGE:  Improving.      Alexzandrew L. Julien Girt, P.A.      Ollen Gross, M.D.  Electronically Signed    ALP/MEDQ  D:  10/20/2005  T:  10/21/2005  Job:  [id number]   cc:   Georgina Quint. Plotnikov, M.D. [facility]  [address]  [place]  [place] [address]   Rehab Services

## 2010-12-17 NOTE — H&P (Signed)
[name], [name]             ACCOUNT NO.:  [id number]   MEDICAL RECORD NO.:  [id number]          PATIENT TYPE:  INP   LOCATION:  X002                         FACILITY:  [facility]   PHYSICIAN:  Ollen Gross, M.D.    DATE OF BIRTH:  Feb 09, 1941   DATE OF ADMISSION:  09/26/2005  DATE OF DISCHARGE:                                HISTORY & PHYSICAL   DATE OF OFFICE VISIT AND HISTORY AND PHYSICAL:  September 13, 2005.   CHIEF COMPLAINT:  Left hip pain.   HISTORY OF PRESENT ILLNESS:  A 70 year old female, well known to Dr. Ollen Gross, having previously been seen for ongoing left hip pain.  She has  undergone injection of the hip secondary to groin pain.  She has done well  up until this past year.  She has had progressive pain in the hip region.  X-  rays show significant arthritic changes of large marginal osteophytes,  significant narrowing to the point where she is pinpoint bone-on-bone  contact.  She has progressed over the past year, interfering with her  activities.  She would like to have something done about it.  It is felt she  had been undergoing a hip replacement.  Risks and benefits have been  discussed.  The patient subsequently admitted to the hospital.   ALLERGIES:  No known drug allergies.   CURRENT MEDICATIONS:  Celebrex, Darvocet, Ambien, omega-3 fish oil, Valium.   PAST MEDICAL HISTORY:  1.  Hypercholesterolemia.  2.  Asymptomatic reflux disease.  3.  Diverticulosis.  4.  Hemorrhoids.  5.  Degenerative cervical disk disease.  6.  Postmenopausal.  7.  History of uterine fibroids prior to hysterectomy.   PAST SURGICAL HISTORY:  1.  Hysterectomy.  2.  Dilatation and curettage.  3.  Neck surgery.  4.  Breast reduction.  5.  Appendectomy.  6.  Tonsillectomy.   SOCIAL HISTORY:  Single, retired, nonsmoker since 45.  Social intake of  wine.  One child.   FAMILY HISTORY:  Brother with a history of cancer and family history of  heart disease.   REVIEW OF  SYSTEMS:  GENERAL:  No fever, chills or night sweats.  NEUROLOGIC:  No seizures, syncope or paralysis.  RESPIRATORY:  No shortness of breath,  productive cough, or hemoptysis.  CARDIOVASCULAR:  No chest pain, angina or  orthopnea.  GI:  History of hemorrhoids and reflux asymptomatic at this  time.  No nausea, vomiting, diarrhea, or constipation.  GU:  No dysuria,  hematuria or discharge.  MUSCULOSKELETAL:  Left hip.   PHYSICAL EXAMINATION:  VITAL SIGNS:  Pulse 76, respirations 16, blood  pressure 160/86.  GENERAL:  A 70 year old white female, well-nourished, well-developed in no  acute distress. Good historian.  Alert, oriented, cooperative, pleasant.  HEENT:  Normocephalic, atraumatic.  Pupils are round and reactive.  Oropharynx clear.  EOMs are intact.  CHEST:  Clear anterior and posterior chest wall.  HEART:  Regular rhythm without murmur.  S1 and S2 noted.  ABDOMEN:  Soft, round, protuberant.  Bowel sounds present.  RECTAL/BREASTS/GENITALIA:  Not done and not pertinent to present illness.  EXTREMITIES:  Left hip:  Hip flexion only to 90 degrees.  Minimal internal  rotation.  External rotation about 10.  Abduction about 20 degrees. She does  ambulate with an antalgic gait.   IMPRESSION:  1.  Osteoarthritis left hip.  2.  Hypercholesterolemia.  3.  Reflux disease.  4.  Diverticulosis.  5.  Hemorrhoids.  6.  Cervical degenerative disk disease.  7.  Postmenopausal.   PLAN:  The patient admitted to [facility] to undergo left total  hip arthroplasty.  Surgery will be performed by Dr. Ollen Gross.  She will  most likely have rehab postoperatively.      [name], P.A.      Ollen Gross, M.D.  Electronically Signed    ALP/MEDQ  D:  09/25/2005  T:  09/26/2005  Job:  [id number]   cc:   Georgina Quint. Plotnikov, M.D. [facility]  [address]  [place]  [place] [address]

## 2010-12-17 NOTE — Discharge Summary (Signed)
[name], [name]             ACCOUNT NO.:  [id number]   MEDICAL RECORD NO.:  [id number]          PATIENT TYPE:  ORB   LOCATION:  4525                         FACILITY:  [facility]   PHYSICIAN:  Erick Colace, M.D.DATE OF BIRTH:  08/27/1940   DATE OF ADMISSION:  09/29/2005  DATE OF DISCHARGE:  10/07/2005                                 DISCHARGE SUMMARY   DISCHARGE DIAGNOSES:  1.  Left hip osteoarthritis.  2.  Acute blood loss anemia.  3.  Degenerative joint disease.   HISTORY OF PRESENT ILLNESS:  [name] is a 70 year old female with a  history of DJD with left hip pain secondary to significant OA. The patient  elected to undergo left total hip replacement on February 26th by Dr.  Lequita Halt. Postoperatively she was partial weightbearing 25% to 50% on  Coumadin for DVT prophylaxis. Also noted to have acute blood loss anemia  with H&H last of 9.3 and 27. Therapy was initiated and the patient was noted  to have problems remembering, partial weightbearing status, noted to have  decrease in endurance, and problems with mobility. Subacute was consulted  for further therapy.   PAST MEDICAL HISTORY:  1.  Dyslipidemia.  2.  Diverticulosis.  3.  __________.  4.  DJD with cervical decompression.  5.  Appendectomy.  6.  Hysterectomy.  7.  Breast reduction.   ALLERGIES:  No known drug allergies.   FAMILY HISTORY:  Positive for cancer and coronary artery disease.   SOCIAL HISTORY:  The patient is single, retired, and lives alone in a two-  level home with three steps at entry and two flights of stairs to bedroom.  She has bed placed on first floor currently. The patient was independent in  driving prior to admission; however, using cane for balance. Does not use  any tobacco, uses alcohol rarely.   HOSPITAL COURSE:  [name] was admitted to subacute on September 29, 2005, for SACU level therapy to consist of PT and OT daily. Post admission  the patient was  maintained on Coumadin for deep venous thrombosis  prophylaxis. Subcu Lovenox was added as INR was subtherapeutic at the time  of admission. Pain was managed with addition of OxyContin CR with p.r.n.  oxycodone and Robaxin being used to supplement for breakthrough pain. The  patient's anxiety level will be managed with p.r.n. use of valium. The  patient has had issues with constipation requiring addition of Senokot-S and  titration to two b.i.d. to help assist with this. The patient's hip incision  was monitored along and noted to be healing well without any signs or  symptoms of infection. Labs done post admission revealed hemoglobin of 9.3,  hematocrit 26.3, white count 8.4, platelet count 265,000. Check of lytes  revealed sodium of 136, potassium 3.6, chloride 100, CO2 31, BUN 4,  creatinine 0.4, glucose 115. Protein stores low with total protein of 5.6,  albumin 2.6. UA /UC initially showed some bacteria. Repeat culture of March  6th  showed a 50,000 colonies of multiple species. [name] was able  to participate in therapy and progressed  along well. The patient was at  modified independence for upper/lower body care, modified independence for  toileting and floor manipulation. She was modified independent for  transfers, modified independent for ambulating 75 to 100 feet with rolling  walker. On October 07, 2005, the patient was discharged to home.   DISCHARGE MEDICATIONS:  1.  Coumadin 5 mg p.o. q.p.m.  2.  Trinsicon one p.o. b.i.d.  3.  OxyContin CR 10 mg q.12h. for one week, then one per day until she goes      on OxyIR 5-10 mg q.4-6h. p.r.n. pain.  4.  Robaxin 500 mg q.i.d. p.r.n. spasm.  5.  Senokot-S two p.o. b.i.d.   DIET:  Regular.   ACTIVITY:  As tolerated with 25% to 50% weight on left lower extremity.  Follow left total hip precautions.   WOUND CARE:  Keep area clean and dry, cleanse with soap and water.   SPECIAL INSTRUCTIONS:  [facility] for PT/OT  p.r.n., home health RN,  __________ with Coumadin to be continued through March 26th.   FOLLOWUP:  The patient is to follow up with Dr. Brien Few and Dr. Posey Rea for  routine checks. Follow up with Dr. Wynn Banker as needed.      Greg Cutter, P.A.      Erick Colace, M.D.  Electronically Signed    PP/MEDQ  D:  10/17/2005  T:  10/18/2005  Job:  [id number]   cc:   Georgina Quint. Plotnikov, M.D. [facility]  [address]  [place]  [place] [address]   Isidor Holts, M.D.   Ollen Gross, M.D.  Fax: [phone]

## 2010-12-17 NOTE — Op Note (Signed)
[name], [name]             ACCOUNT NO.:  [id number]   MEDICAL RECORD NO.:  [id number]          [name] TYPE:  AMB   LOCATION:  DAY                          FACILITY:  [facility]   PHYSICIAN:  Almedia Balls. Fore, M.D.   DATE OF BIRTH:  12/30/1940   DATE OF PROCEDURE:  05/20/2004  DATE OF DISCHARGE:                                 OPERATIVE REPORT   PREOPERATIVE DIAGNOSES:  1.  Postmenopausal bleeding - recurrent.  2.  Uterine enlargement.  3.  Leiomyomata uteri.  4.  Status post right salpingo-oophorectomy.   POSTOPERATIVE DIAGNOSES:  1.  Postmenopausal bleeding - recurrent.  2.  Uterine enlargement.  3.  Leiomyomata uteri.  4.  Status post right salpingo-oophorectomy.   OPERATION:  Abdominal supracervical hysterectomy, left salpingo-  oophorectomy, enterolysis.   ANESTHESIA:  General orotracheal.   OPERATOR:  Almedia Balls. [name], M.D.   FIRST ASSISTANT:  Leona Singleton, M.D.   INDICATIONS FOR SURGERY:  The [name] is a 70 year old with the above-noted  problems who has been counseled as to the need for surgery to treat these  problems.  She was fully counseled as to the nature of the procedure and the  risks involved to include risks of anesthesia, injury to bowel, bladder,  blood vessels, ureters, postoperative hemorrhage, infection, and  recuperation.  She fully understands all these considerations and wishes to  proceed on May 20, 2004.  She has signed informed consent for this  procedure as well.   OPERATIVE FINDINGS:  On entry into the abdomen, there were dense adhesions  involving loops of bowel to the anterior peritoneal surface, thought to be  secondary to previous surgical procedure.  There were some adhesions from  loops of bowel into the pelvic area as well.  Exploration of the upper  abdomen was limited because of adhesions in the right upper quadrant so that  the lower liver edge could not be adequately evaluated.  The spleen,  kidneys, paraaortic areas, and  appendix were normal to visualization and/or  palpation.  In the pelvis, the uterus was midposterior, enlarged to  approximately 8-[redacted] weeks gestational size, with several myomata present.  The right tube and ovary had been previously surgically excised.  Left  adnexal structures were quite atrophic but present.   PROCEDURE:  With the [name] under general anesthesia, prepared and draped  in the usual sterile fashion, with a Foley catheter in the bladder, a lower  abdominal transverse incision was made after excising a previous surgical  scar.  This incision was carried to the peritoneal cavity, where careful  dissection was necessary for lysis of adhesions and extensive enterolysis.  This was accomplished using sharp dissection and Bovie electrodissection.  A  self-retaining retractor was placed after entry into the peritoneum, and the  bowel was packed off.  Kelly clamps were placed across the round ligaments  and remaining structures bilaterally for traction and hemostasis.  The round  ligaments were transected using Bovie electrocoagulation, with development  of a bladder flap anteriorly and entering into the retroperitoneal space.  The left infundibulopelvic ligament was identified well above the course of  the ureter and the vessels, clamped, cut, and doubly ligated with one  chromic catgut.  Uterine vessels bilaterally were then skeletonized,  clamped, cut, and suture ligated with 1 chromic catgut.  Cardinal ligaments  bilaterally were likewise clamped, cut, and suture ligated with 1 chromic  catgut.  It was then possible to excise the uterine fundus and left adnexal  structures using Bovie electrocoagulation to remove the endocervix and  fundus.  The remaining cervical stump was rendered hemostatic with  destruction of the endocervical canal using Bovie electrocoagulation.  The  cervical stump was reapproximated and rendered hemostatic with interrupted  figure-of-eight sutures of 1  chromic catgut.  The area was lavaged with  copious amounts of lactated  Ringer's solution, and after noting that  hemostasis was maintained, the area was reperitonealized with an interrupted  suture of 1 chromic catgut.  After noting that hemostasis was maintained and  that sponge and instrument counts were correct, the peritoneum was closed  with a continuous suture of 0 Vicryl.  The fascia was closed with two  sutures of 0 Vicryl which were brought up from the lateral aspects of the  incision and tied separately in the midline.  Subcutaneous fat was  reapproximated with interrupted sutures of 0 Vicryl.  Skin was closed with a  subcuticular suture of 3-0 plain catgut.   ESTIMATED BLOOD LOSS:  250 ml.   The [name] was taken to the recovery room in good condition with clear  urine and Foley catheter tubing.  She will be placed on 23-hour observation  following surgery.      SRF/MEDQ  D:  05/20/2004  T:  05/20/2004  Job:  [id number]   cc:   Leona Singleton, M.D.  [address]  [place]  [place] [address]  Fax: [phone]

## 2010-12-17 NOTE — Op Note (Signed)
[facility]  Patient:    [name]                     MRN: [id number] Proc. Date: 06/07/99 Adm. Date:  [id number] Attending:  Gustavus Messing CC:         Yaakov Guthrie. Shon Hough, M.D. x 2                           Operative Report  PREOPERATIVE DIAGNOSIS:  A 70 year old lady who has severe, severe macromastia,  back and shoulder pain secondary to large pendulous breasts, and also excessory  breast tissue in the right and left axillary areas anteriorly, as well as going  posteriorly over the latissimus dorsi area.  All the areas have caused increased rash and irritations in the past.  POSTOPERATIVE DIAGNOSIS:  A 70 year old lady who has severe, severe macromastia, back and shoulder pain secondary to large pendulous breasts, and also excessory  breast tissue in the right and left axillary areas anteriorly, as well as going  posteriorly over the latissimus dorsi area.  All the areas have caused increased rash and irritations in the past.  PROCEDURES PLANNED:  Bilateral breast reduction using the inferior pedicle technique, reduction of all excessory breast tissue.  SURGEON:  Yaakov Guthrie. Shon Hough, M.D.  ANESTHESIA:  General.  DESCRIPTION OF PROCEDURE:  Preoperatively, the patient was set up and drawn for the inferior pedicle reduction mammoplasty, remarking the nipple areolar complexes ack to 20 cm from the suprasternal notch.  She then underwent general anesthesia and intubated orally.  Prep was done to the chest and breast areas in the routine fashion using Betadine soap and solution, and walled off with sterile towels and drapes so as to make a sterile field.  Quarter percent Xylocaine was injected into the parenchyma, a total of 100 cc, 1:400,000 concentration.  The wounds were scored with a #15 blade, and then the skin over the inferior pedicle was deepithelialized, down to underlying areas.  Next, a medial and  lateral fatty dermal pedicles were excised down to the underlying pectoralis major fascia.  Out laterally, more tissue was taken to improve symmetry.  Excessory breast tissue was then removed with direct observation and excision into the axillary and latissimus dorsi region.  Next, the new key hole area was debulked after proper hemostasis.  The flaps were transposed and stayed with 3-0 Prolene.  Subcutaneous closure was done with  3-0 Monocryl x 2 layers, and then the skin edge reapproximated with running subcuticular stitch of 3-0 Monocryl and a 5-0 Monocryl throughout the inverted-T. The wounds were drained with #10 Blake drains which were placed in the depths of the wound and brought out through the lateral most portion of the incision, and  secured with 3-0 Prolene.  The same procedure was carried out on both sides. The right breast was bigger, removing almost 1978 g and on the left side, approximately 1750.  The wounds were cleansed.  Steri-Strips and a soft dressing were applied including Xeroform, 4 x 4s, ABD, and Hyperfix tape.  She was then taken to recovery in excellent condition.  Estimated blood loss 150 cc.  Complications none. DD:  06/07/99 TD:  06/08/99 Job: [id number]

## 2010-12-17 NOTE — Op Note (Signed)
NAMECOURTLAND, COPPA             ACCOUNT NO.:  [id number]   MEDICAL RECORD NO.:  [id number]          PATIENT TYPE:  INP   LOCATION:  X002                         FACILITY:  [facility]   PHYSICIAN:  Ollen Gross, M.D.    DATE OF BIRTH:  [birth year]   DATE OF PROCEDURE:  09/26/2005  DATE OF DISCHARGE:                                 OPERATIVE REPORT   PREOPERATIVE DIAGNOSIS:  Osteoarthritis left hip.   POSTOPERATIVE DIAGNOSIS:  Osteoarthritis left hip.   PROCEDURE:  Left total hip arthroplasty.   SURGEON:  Ollen Gross, M.D.   ASSISTANT:  Alexzandrew L. Julien Girt, P.A.   ANESTHESIA:  General.   ESTIMATED BLOOD LOSS:  700.   DRAINS:  Hemovac x1.   COMPLICATIONS:  None.   CONDITION:  Stable to recovery.   CLINICAL NOTE:  Ms. Virden is a 70 year old female with end-stage  osteoarthritis of the left hip with intractable pain. She has failed  nonoperative management including injections and presents now for total hip  arthroplasty.   PROCEDURE IN DETAIL:  After successful administration of general anesthetic,  the patient's placed in the right lateral decubitus position with the left  side up and held with the hip positioner. The left lower extremity is  isolated from her perineum with plastic drapes and prepped and draped in the  usual sterile fashion. A short posterolateral incision was made with a 10  blade through the subcutaneous tissue to the level of the fascia lata which  was incised in line with the skin incision. The sciatic nerve was palpated  and protected and the short rotators isolated off the femur. Capsulectomy  was performed and the hip is dislocated. The center of the femoral head is  marked and the trial prosthesis placed such that the center of the trial  head corresponds to the center of her native femoral head. Osteotomy line is  marked on the femoral neck and osteotomy made with an oscillating saw.  Femoral head is removed and then the femur retracted  anteriorly to gain  acetabular exposure.   The acetabular labrum and osteophytes are removed. Reaming starts at 45 mm  coursing in increments of 2 up to 51 mm. A 52 mm off pinnacle acetabular  shell was placed in anatomic position and transfixed with two dome screws. A  trial 36 mm neutral liner was placed.   The femur is prepared with the canal finder and irrigation. Axial reaming is  performed to 13.5 mm, proximal reaming to an 18D and the sleeve machined to  a small. The 18D small trial sleeve is placed and the 18 x 13 stem and a 36  plus 8 neck. We placed the stem about 10 degrees beyond her native  anteversion which was close to neutral. The 36 plus 0 trial head is placed  and the hip is reduced. She had great stability with full extension, full  external rotation, 70 degrees flexion, 40 degrees adduction,  90 degrees  internal rotation and 90 degrees of flexion and 70 degrees of internal  rotation. By placing the left leg on top of the right, it  felt as though the  leg lengths were equal. The trials were then removed and the permanent apex  hole eliminator was placed into the acetabular shell. The permanent 36 mm  neutral Ultamet metal liner is placed. This is a metal-on-metal hip  replacement. On the femoral side, we placed an 18D small sleeve with the 18  x 13 stem and a 36 plus 8 neck again 10 degrees beyond her native  anteversion. The 36 plus 0 head is placed and the hip is reduced the same  stability parameters. The wound was copiously irrigated with saline solution  and the short rotators reattached to the femur through drill holes. The  fascia lata was closed over a Hemovac drain with interrupted #1 Vicryl,  subcu closed with #1 and then 2-0 Vicryl and subcuticular with running 4-0  Monocryl. Incision is cleaned and dried and Steri-Strips and a bulky sterile  dressing applied. The drain is hooked to suction and she is placed into a  knee immobilizer, awakened and  transported to recovery in stable condition.      Ollen Gross, M.D.  Electronically Signed     FA/MEDQ  D:  09/26/2005  T:  09/26/2005  Job:  [id number]

## 2010-12-17 NOTE — Op Note (Signed)
[name], [name]             ACCOUNT NO.:  [id number]   MEDICAL RECORD NO.:  [id number]          PATIENT TYPE:  AMB   LOCATION:  DAY                          FACILITY:  [facility]   PHYSICIAN:  Almedia Balls. Fore, M.D.   DATE OF BIRTH:  May 18, 1941   DATE OF PROCEDURE:  DATE OF DISCHARGE:                                 OPERATIVE REPORT   HISTORY:  The patient is a 70 year old gravida 2 para 1 whose last menstrual  period was over 10 years ago.  She has been seen in our office since March  2001.  In that time, she has had recurring pain in her ovary area, with  ultrasounds showing no definite pathology.  She has had postmenopausal  bleeding which has been recurrent and underwent hysteroscopy, D&C in 2003,  with benign changes.  Pap smear done within the last year has been within  normal limits.  She has had recurrent postmenopausal bleeding recently, and  an ultrasound performed on April 26, 2004 showed irregularity in the  uterus, with a slight enlargement of the uterus itself.  There is no  definite pathology noted in the endometrium, and it was felt that she should  undergo definitive therapy because of the continued problems involved.  She  has been counseled as to the nature of the hysterectomy, bilateral salpingo-  oophorectomy, to include the procedure itself, risks of injury, or risks of  anesthetic allergy, injury to bowel, bladder, blood vessels, ureters,  postoperative hemorrhage, infection, recuperation, and possible hormone  replacement, although the patient has declined hormone replacement until  this time.  She fully understands all these considerations and has signed  informed consent to proceed on May 20, 2004.   PAST MEDICAL HISTORY:  1.  Cyst of her ovaries in the 1960s.  2.  Appendectomy in 1960.  3.  Excision of right ovarian cyst in 1996.  4.  Cervical disc in October 1999.  5.  D&C by another physician in February 2000.  6.  Breast reduction in November  2000.   The patient takes occasional NSAIDs for pain.   She is allergic to no medications.   FAMILY HISTORY:  Includes brother with carcinoma of the oropharynx, and  mother died with polymyositis.  Father and brother with cardiovascular  disease.   REVIEW OF SYSTEMS:  HEENT:  Negative, except for headaches.  CARDIORESPIRATORY:  Negative.  GASTROINTESTINAL:  Negative.  GASTROINTESTINAL:  As in present illness.  NEUROMUSCULAR:  Negative.   PHYSICAL EXAMINATION:  HEIGHT:  5 feet, 3-3/4 inches.  WEIGHT:  198 pounds.  BLOOD PRESSURE:  142/84.  RESPIRATIONS:  18.  PULSE:  80.  GENERAL:  Well-developed white female in no acute distress.  HEENT:  Within normal limits.  NECK:  Supple, without masses, adenopathy, or bruits.  HEART:  Regular rate and rhythm, without murmurs.  LUNGS:  Clear to P&A.  BREASTS:  Sitting and lying, without mass.  Axillae negative.  ABDOMEN:  Flat and soft, without mass.  PELVIC:  External genitalia, Bartholin's, urethra, and Skene's glands within  normal limits.  Cervix is slightly inflamed.  Uterus is  midposition, top  normal size, somewhat tender.  There were no palpable adnexal masses.  Rectovaginal exam is confirmatory.  EXTREMITIES:  Within normal limits.  CENTRAL NERVOUS SYSTEM:  Grossly intact.  SKIN:  Without suspicious lesions.   IMPRESSION:  Recurrent postmenopausal bleeding.  Pelvic pain.   DISPOSITION:  As noted above.      SRF/MEDQ  D:  05/17/2004  T:  05/18/2004  Job:  [id number]

## 2010-12-17 NOTE — H&P (Signed)
[name], [name]             ACCOUNT NO.:  [id number]   MEDICAL RECORD NO.:  [id number]          PATIENT TYPE:  ORB   LOCATION:  4525                         FACILITY:  [facility]   PHYSICIAN:  Ellwood Dense, M.D.   DATE OF BIRTH:  May 13, 1941   DATE OF ADMISSION:  09/29/2005  DATE OF DISCHARGE:                                HISTORY & PHYSICAL   PRIMARY CARE PHYSICIAN:  Georgina Quint. Plotnikov, M.D.   ORTHOPEDIST:  Ollen Gross, M.D.   HISTORY OF PRESENT ILLNESS:  [name] is a 70 year old female with  history of degenerative joint disease and degenerative disk disease.  She  has had significant left hip pain for several months to years.  She has  failed conservative treatment. She underwent a left total hip replacement  September 26, 2005, by Dr. Lequita Halt.  Postoperatively, she was allowed partial  weightbearing 25 to 50% on the left hip. She is on Coumadin for DVT  prophylaxis with an INR of 1.5 today.  She did suffer acute blood loss  anemia with hemoglobin of 9.3 and 27 and also had leukocytosis which is  resolving.  She did require cuing for partial weightbearing maintenance  along with endurance and mobility.   The patient was evaluated by the rehabilitation physicians and felt to be an  appropriate candidate for SACU level therapy.   REVIEW OF SYSTEMS:  Positive for occasional cervicalgia, occasional lumbago,  anxiety, insomnia, reflux.   PAST MEDICAL HISTORY:  1.  Dyslipidemia.  2.  Diverticulosis.  3.  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  4.  Hysterectomy.  5.  Breast reduction.  6.  Prior appendectomy.   FAMILY HISTORY:  Positive for coronary artery disease and cancer.   SOCIAL HISTORY:  The patient is single and retired, lives alone in a two-  level home with three steps to enter and two flights of stairs to reach the  bedroom.  She has a bed placed on the first floor.  She does not use tobacco  and has  rare alcohol usage.  She has friends to check on her at discharge.   FUNCTIONAL HISTORY PRIOR TO ADMISSION:  Independent and driving prior to  admission.  She did use a cane periodically.   ALLERGIES:  No known drug allergies.   MEDICATIONS:  1.  Darvocet p.r.n.  2.  Aciphex p.r.n.  3.  Ambien 10 mg nightly.  4.  Valium 10 mg twice daily.  5.  Celebrex 200 mg twice daily.   LABORATORY DATA:  Recent labs showed a hemoglobin of 14, hematocrit 41.3,  platelet count 202,000, and white count of 6.7.  Followup hemoglobin and  hematocrit have been measured 9. 2 and 27.2, respectively, with white count  of 11.6 and platelet count 224,000.  Recent sodium was 136, potassium 4.1,  chloride 102, CO2 26, BUN 13, creatinine 0.8, glucose 110.   PHYSICAL EXAMINATION:  GENERAL: Reasonably well-appearing, overweight, adult  female lying in chair in no acute discomfort.  HEENT:  Normocephalic and atraumatic.  CARDIOVASCULAR: Regular rate and rhythm.  S1, S2 without murmurs.  ABDOMEN: Soft, nontender, with positive bowel sounds.  LUNGS: Clear to auscultation bilaterally.  NEUROLOGIC: Alert and oriented x3.  Cranial nerves II-XII intact.  EXTREMITIES: Bilateral upper extremity exam shows 5-/5 strength throughout.  Bulk and tone were normal.  Reflexes 2+ and symmetrical.  Sensation was  intact to light touch throughout the bilateral upper extremities.  Left  lower extremity strength was 4-/5, and right lower extremity strength was  4+/5.  She has a well-healing left hip wound with staples in place and no  significant drainage.  Sensation was intact to light touch throughout the  bilateral lower extremities.  Reflexes were 1+ and symmetrical.   IMPRESSION:  Status post left total hip arthroplasty, postoperative day #14.  Allow partial weightbearing 25 to 50%.   Presently the patient has deficits in ADLs, transfers, and ambulation.   PLAN:  1.  Admit to the rehabilitation unit on subacute for daily  OT and PT therapy      to advance ADLs, transfers, and ambulation.  2.  Check admission labs Friday, September 30, 2005, including CBC and CMET.  3.  Twenty-four-hour nursing for medication monitoring, monitoring of vital      signs, and wound dressing changes as needed.  4.  Continue subcutaneous Lovenox 40 mg subcutaneously daily until INR      consistently greater than 2.  5.  Discontinue Aciphex.  6.  Continue Robaxin 500 mg 4 times a day p.r.n. with Valium 10 mg p.o.      twice daily p.r.n. for spasticity and spasm.  7.  Continue oxycodone 5 mg 1 to 2 tablets p.o. q. 4-6 h p.r.n. along with      addition of OxyContin CR 10 mg q. 12 h for pain control.  8.  Continue Trinsicon 1 tablet p.o. twice daily for postoperative anemia.   PROGNOSIS:  Good.   ESTIMATED LENGTH OF STAY:  4 to 10 days.   GOALS:  Modified independent for ADLs, transfers, and ambulation.           ______________________________  Ellwood Dense, M.D.     DC/MEDQ  D:  09/29/2005  T:  09/29/2005  Job:  [id number]

## 2010-12-21 ENCOUNTER — Ambulatory Visit: Payer: Self-pay | Admitting: Internal Medicine

## 2010-12-24 ENCOUNTER — Encounter: Payer: Self-pay | Admitting: Internal Medicine

## 2010-12-24 ENCOUNTER — Ambulatory Visit (INDEPENDENT_AMBULATORY_CARE_PROVIDER_SITE_OTHER): Payer: Medicare Other | Admitting: Internal Medicine

## 2010-12-24 DIAGNOSIS — M545 Low back pain, unspecified: Secondary | ICD-10-CM

## 2010-12-24 DIAGNOSIS — Z0000 Encounter for general adult medical examination without abnormal findings: Secondary | ICD-10-CM

## 2010-12-24 DIAGNOSIS — D518 Other vitamin B12 deficiency anemias: Secondary | ICD-10-CM

## 2010-12-24 DIAGNOSIS — E785 Hyperlipidemia, unspecified: Secondary | ICD-10-CM

## 2010-12-24 DIAGNOSIS — R413 Other amnesia: Secondary | ICD-10-CM

## 2010-12-24 DIAGNOSIS — E669 Obesity, unspecified: Secondary | ICD-10-CM

## 2010-12-24 DIAGNOSIS — L659 Nonscarring hair loss, unspecified: Secondary | ICD-10-CM

## 2010-12-24 DIAGNOSIS — E538 Deficiency of other specified B group vitamins: Secondary | ICD-10-CM

## 2010-12-24 MED ORDER — DIAZEPAM 5 MG PO TABS: 5.0000 mg | ORAL_TABLET | Freq: Two times a day (BID) | ORAL | Status: DC | PRN

## 2010-12-24 MED ORDER — PHENTERMINE HCL 37.5 MG PO CAPS: 37.5000 mg | ORAL_CAPSULE | ORAL | Status: DC

## 2010-12-24 NOTE — Progress Notes (Signed)
  Subjective:    Patient ID: [name], female    DOB: 01/03/1941, 70 y.o.   MRN: [id number]  HPI    The patient is here to follow up on chronic depression, anxiety, headaches and chronic moderate  hip symptoms controlled with medicines, diet and exercise.  Review of Systems  Constitutional: Negative for diaphoresis.  HENT: Negative for sneezing and drooling.   Eyes: Negative for pain.  Respiratory: Negative for cough.   Gastrointestinal: Negative for blood in stool.  Genitourinary: Negative for genital sores.  Musculoskeletal: Negative for back pain.  Neurological: Negative for headaches.  Psychiatric/Behavioral: Negative for suicidal ideas, hallucinations and confusion.       Objective:   Physical Exam  Constitutional: She appears well-developed and well-nourished. No distress.  HENT:  Head: Normocephalic.  Right Ear: External ear normal.  Left Ear: External ear normal.  Nose: Nose normal.  Mouth/Throat: Oropharynx is clear and moist.  Eyes: Conjunctivae are normal. Pupils are equal, round, and reactive to light. Right eye exhibits no discharge. Left eye exhibits no discharge.  Neck: Normal range of motion. Neck supple. No JVD present. No tracheal deviation present. No thyromegaly present.  Cardiovascular: Normal rate, regular rhythm and normal heart sounds.   Pulmonary/Chest: No stridor. No respiratory distress. She has no wheezes.  Abdominal: Soft. Bowel sounds are normal. She exhibits no distension and no mass. There is no tenderness. There is no rebound and no guarding.  Musculoskeletal: She exhibits tenderness. She exhibits no edema.  Lymphadenopathy:    She has no cervical adenopathy.  Neurological: She displays normal reflexes. No cranial nerve deficit. She exhibits normal muscle tone. Coordination normal.  Skin: No rash noted. No erythema.  Psychiatric: Her behavior is normal. Judgment and thought content normal.          Assessment & Plan:  LOW BACK  PAIN Diazepam and Celebrex prn  MEMORY LOSS Stable lately  HYPERLIPIDEMIA On diet  ALOPECIA Better  ANEMIA, VITAMIN B12 DEFICIENCY NEC On Rx  OBESITY Wt Readings from Last 3 Encounters:  12/24/10 189 lb (85.73 kg)  09/28/10 186 lb (84.369 kg)  08/23/10 187 lb (84.823 kg)  Will renew Phentermine  Potential benefits of a long term phentermine use as well as potential risks  and complications were explained to the patient and were aknowledged.

## 2010-12-24 NOTE — Assessment & Plan Note (Signed)
Wt Readings from Last 3 Encounters:  12/24/10 189 lb (85.73 kg)  09/28/10 186 lb (84.369 kg)  08/23/10 187 lb (84.823 kg)  Will renew Phentermine  Potential benefits of a long term phentermine use as well as potential risks  and complications were explained to the patient and were aknowledged.

## 2010-12-24 NOTE — Assessment & Plan Note (Signed)
Stable lately 

## 2010-12-24 NOTE — Patient Instructions (Signed)
Stop walking on eggshells

## 2010-12-24 NOTE — Assessment & Plan Note (Signed)
On Rx 

## 2010-12-24 NOTE — Assessment & Plan Note (Signed)
Diazepam and Celebrex prn

## 2010-12-24 NOTE — Assessment & Plan Note (Signed)
  On diet  

## 2010-12-24 NOTE — Assessment & Plan Note (Signed)
Better  

## 2011-01-31 ENCOUNTER — Telehealth: Payer: Self-pay | Admitting: *Deleted

## 2011-01-31 MED ORDER — ZOSTER VACCINE LIVE 19400 UNT/0.65ML ~~LOC~~ SOLR: 0.6500 mL | Freq: Once | SUBCUTANEOUS | Status: DC

## 2011-01-31 NOTE — Telephone Encounter (Signed)
Per pt's insurance plan she needs RX for zostavax so she can get it at local pharm. Rx completed, pt aware.

## 2011-03-15 ENCOUNTER — Telehealth: Payer: Self-pay | Admitting: *Deleted

## 2011-03-15 DIAGNOSIS — M79673 Pain in unspecified foot: Secondary | ICD-10-CM

## 2011-03-15 NOTE — Telephone Encounter (Signed)
OK Dr Charlsie Merles Done Thx

## 2011-03-15 NOTE — Telephone Encounter (Signed)
Pt is having right foot pain on top part of foot, foot is painful to touch. Pt is asking for a referral for a podiatrist.

## 2011-03-16 NOTE — Telephone Encounter (Signed)
Pt informed

## 2011-04-22 ENCOUNTER — Encounter: Payer: Self-pay | Admitting: Gynecology

## 2011-04-26 ENCOUNTER — Other Ambulatory Visit (HOSPITAL_COMMUNITY)
Admission: RE | Admit: 2011-04-26 | Discharge: 2011-04-26 | Disposition: A | Discharge status: Home / Self Care | Payer: Medicare Other | Source: Ambulatory Visit | Attending: Gynecology | Admitting: Gynecology

## 2011-04-26 ENCOUNTER — Encounter: Payer: Self-pay | Admitting: Gynecology

## 2011-04-26 ENCOUNTER — Ambulatory Visit (INDEPENDENT_AMBULATORY_CARE_PROVIDER_SITE_OTHER): Payer: Medicare Other | Admitting: Gynecology

## 2011-04-26 DIAGNOSIS — N952 Postmenopausal atrophic vaginitis: Secondary | ICD-10-CM

## 2011-04-26 DIAGNOSIS — Z124 Encounter for screening for malignant neoplasm of cervix: Secondary | ICD-10-CM

## 2011-04-26 DIAGNOSIS — Z780 Asymptomatic menopausal state: Secondary | ICD-10-CM

## 2011-04-26 DIAGNOSIS — Z1211 Encounter for screening for malignant neoplasm of colon: Secondary | ICD-10-CM

## 2011-04-26 DIAGNOSIS — Z01419 Encounter for gynecological examination (general) (routine) without abnormal findings: Secondary | ICD-10-CM | POA: Insufficient documentation

## 2011-04-26 DIAGNOSIS — K921 Melena: Secondary | ICD-10-CM

## 2011-04-26 LAB — POC HEMOCCULT BLD/STL (OFFICE/1-CARD/DIAGNOSTIC): Fecal Occult Blood, POC: POSITIVE

## 2011-04-26 MED ORDER — ESTROGENS, CONJUGATED 0.625 MG/GM VA CREA: TOPICAL_CREAM | Freq: Every day | VAGINAL | Status: AC

## 2011-04-26 NOTE — Patient Instructions (Signed)
Your prescription was called in for your Premarin vaginal cream to apply intravaginally twice a week. We will notify you after your bone density study if there is any abnormality and will also send you a copy of report for your records.

## 2011-04-26 NOTE — Progress Notes (Signed)
TENAE GRAZIOSI August 08, 1940 [id number]   History:    70 y.o.  for annual exam with history of vaginal atrophy. Review of her record indicated that she lost a couple passes last year. Her vaginal atrophy she's been applying Premarin vaginal cream twice a week and has done well. Patient denies any vaginal bleeding of any sort. Her mammogram is up-to-date. She has been followed by Dr. Valerie Salts  and is scheduled to see him next week so no blood work will be drawn today. Review record indicated also that she had a normal colonoscopy in 2008. She had a breast reduction in the year 2000. 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 She has had a cervical discectomy C2-C3, C3-C4 in 1995 and 2009 respectively. Her last bone density study was normal in 2010. She in frequently does her monthly self breast exam. She sees Dr. [place] for yearly mole checks.  Past medical history,surgical history, family history and social history were all reviewed and documented in the EPIC chart.  ROS:  Was performed and pertinent positives and negatives are included in the history.  Exam: chaperone present BP 120/80  Ht 5' 4.5" (1.638 m)  Wt 194 lb (87.998 kg)  BMI 32.79 kg/m2  Body mass index is 32.79 kg/(m^2).  General appearance : Well developed well nourished female. No acute distress HEENT: Neck supple, trachea midline, no carotid bruits, no thyroidmegaly Lungs: Clear to auscultation, no rhonchi or wheezes, or rib retractions  Heart: Regular rate and rhythm, no murmurs or gallops Breast:Examined in sitting and supine position were symmetrical in appearance, no palpable masses or tenderness,  no skin retraction, no nipple inversion, no nipple discharge, no skin discoloration, no axillary or supraclavicular lymphadenopathy Abdomen: no palpable masses or tenderness, no rebound or guarding Extremities: no edema or skin discoloration or tenderness  Pelvic:  Bartholin, Urethra, Skene  Glands: Within normal limits             Vagina: No gross lesions or discharge atrophic changes  Cervix: No gross lesions or discharge  Uterus  axial, normal size, shape and consistency, non-tender and mobile  Adnexa  Without masses or tenderness  Anus and perineum  normal   Rectovaginal  normal sphincter tone without palpated masses or tenderness             Hemoccult obtained results pending at time of this dictation     Assessment/Plan:  70 y.o. female for annual exam with vaginal atrophy. Pap smear done today. Hemoccult testing done result pending at time of this dictation. Patient to schedule a bone density study here in our office in the next few weeks. Patient to continue her calcium and vitamin D for osteoporosis prevention. Patient to followup with Dr. Valerie Salts and a few weeks.    Ok Edwards MD, 2:51 PM 04/26/2011

## 2011-04-27 ENCOUNTER — Telehealth: Payer: Self-pay | Admitting: *Deleted

## 2011-04-27 NOTE — Telephone Encounter (Signed)
Colon 2005 We can give her hem cards x 3 to use at home and bring back to lab Thx

## 2011-04-27 NOTE — Telephone Encounter (Signed)
Stool cards ready and explained how to collect specimens.

## 2011-04-27 NOTE — Telephone Encounter (Signed)
Pt was seen by Dr Lily Peer and they advised her to see GI due to positive hemocult test in their office. Pt had colonoscopy by Dr Juanda Chance and req Dr Plotnikov's advisement.

## 2011-05-02 LAB — URINALYSIS, ROUTINE W REFLEX MICROSCOPIC
Bilirubin Urine: NEGATIVE
Glucose, UA: NEGATIVE
Hgb urine dipstick: NEGATIVE
Ketones, ur: NEGATIVE
Nitrite: NEGATIVE
Protein, ur: NEGATIVE
Specific Gravity, Urine: 1.011
Urobilinogen, UA: 1
pH: 6.5

## 2011-05-02 LAB — CBC
HCT: 42.1
HCT: 34.5 — ABNORMAL LOW
Hemoglobin: 14.4
Hemoglobin: 11.9 — ABNORMAL LOW
MCHC: 34.1
MCHC: 34.3
MCV: 94.5
MCV: 94.9
Platelets: 291
Platelets: 259
RBC: 4.46
RBC: 3.64 — ABNORMAL LOW
RDW: 12.7
RDW: 13.1
WBC: 6.9
WBC: 13.9 — ABNORMAL HIGH

## 2011-05-02 LAB — CULTURE, BLOOD (ROUTINE X 2)
Culture: NO GROWTH
Culture: NO GROWTH

## 2011-05-02 LAB — GLUCOSE, CAPILLARY: Glucose-Capillary: 86

## 2011-05-04 ENCOUNTER — Telehealth: Payer: Self-pay | Admitting: *Deleted

## 2011-05-04 NOTE — Telephone Encounter (Signed)
Pt states she dropped of tests to the lab on Monday and would like to know results. I called pt for clarification. She states she dropped off Stool studies to our lab on Monday. I do not see results in system. Will call lab tomorrow to see if they can release results.

## 2011-05-05 ENCOUNTER — Other Ambulatory Visit: Payer: Self-pay | Admitting: Internal Medicine

## 2011-05-05 ENCOUNTER — Other Ambulatory Visit: Payer: BC Managed Care – PPO

## 2011-05-05 DIAGNOSIS — Z1289 Encounter for screening for malignant neoplasm of other sites: Secondary | ICD-10-CM

## 2011-05-05 LAB — HEMOCCULT SLIDES (X 3 CARDS)
Fecal Occult Blood: NEGATIVE
OCCULT 1: NEGATIVE
OCCULT 2: NEGATIVE
OCCULT 3: NEGATIVE
OCCULT 4: NEGATIVE
OCCULT 5: NEGATIVE

## 2011-05-05 NOTE — Telephone Encounter (Signed)
Spoke to [place] in [place] lab. She states they have the cards but have not resulted them yet. She states she will do them today.

## 2011-05-05 NOTE — Telephone Encounter (Signed)
Results ready, please advise.

## 2011-05-06 NOTE — Telephone Encounter (Signed)
All negative Thx

## 2011-05-06 NOTE — Telephone Encounter (Signed)
Left detailed VM on pt's HM #

## 2011-05-26 ENCOUNTER — Ambulatory Visit (INDEPENDENT_AMBULATORY_CARE_PROVIDER_SITE_OTHER): Payer: Medicare Other

## 2011-05-26 DIAGNOSIS — Z780 Asymptomatic menopausal state: Secondary | ICD-10-CM

## 2011-05-26 DIAGNOSIS — M899 Disorder of bone, unspecified: Secondary | ICD-10-CM

## 2011-05-26 DIAGNOSIS — M8580 Other specified disorders of bone density and structure, unspecified site: Secondary | ICD-10-CM

## 2011-05-26 DIAGNOSIS — M949 Disorder of cartilage, unspecified: Secondary | ICD-10-CM

## 2011-06-14 ENCOUNTER — Other Ambulatory Visit: Payer: Self-pay | Admitting: *Deleted

## 2011-06-14 ENCOUNTER — Telehealth: Payer: Self-pay | Admitting: *Deleted

## 2011-06-14 DIAGNOSIS — M8580 Other specified disorders of bone density and structure, unspecified site: Secondary | ICD-10-CM

## 2011-06-14 DIAGNOSIS — M898X9 Other specified disorders of bone, unspecified site: Secondary | ICD-10-CM

## 2011-06-14 NOTE — Telephone Encounter (Signed)
Ok to order labs Thx

## 2011-06-14 NOTE — Telephone Encounter (Signed)
Patient states that Dr Ardyth Harps office would like her to have calcium, vitamin D, and PTH level testing done after getting results from Bone Density Test [showed bone loss]. Pt has OV Friday 11.16.12 w/you and would like to know if she can get these labs ordered & done through [facility], or does she have to go back to Dr Ardyth Harps office.?

## 2011-06-15 NOTE — Telephone Encounter (Signed)
Done. LMOM to inform patient. 

## 2011-06-17 ENCOUNTER — Other Ambulatory Visit (INDEPENDENT_AMBULATORY_CARE_PROVIDER_SITE_OTHER): Payer: Medicare Other

## 2011-06-17 ENCOUNTER — Ambulatory Visit (INDEPENDENT_AMBULATORY_CARE_PROVIDER_SITE_OTHER): Payer: Medicare Other | Admitting: Internal Medicine

## 2011-06-17 ENCOUNTER — Other Ambulatory Visit: Payer: Self-pay | Admitting: Internal Medicine

## 2011-06-17 ENCOUNTER — Encounter: Payer: Self-pay | Admitting: Internal Medicine

## 2011-06-17 VITALS — BP 130/80 | HR 80 | Temp 98.0°F | Resp 16 | Wt 198.0 lb

## 2011-06-17 DIAGNOSIS — M545 Low back pain, unspecified: Secondary | ICD-10-CM

## 2011-06-17 DIAGNOSIS — M898X9 Other specified disorders of bone, unspecified site: Secondary | ICD-10-CM

## 2011-06-17 DIAGNOSIS — D518 Other vitamin B12 deficiency anemias: Secondary | ICD-10-CM

## 2011-06-17 DIAGNOSIS — M1990 Unspecified osteoarthritis, unspecified site: Secondary | ICD-10-CM

## 2011-06-17 DIAGNOSIS — E559 Vitamin D deficiency, unspecified: Secondary | ICD-10-CM

## 2011-06-17 DIAGNOSIS — Z0000 Encounter for general adult medical examination without abnormal findings: Secondary | ICD-10-CM

## 2011-06-17 DIAGNOSIS — Z23 Encounter for immunization: Secondary | ICD-10-CM

## 2011-06-17 DIAGNOSIS — M8580 Other specified disorders of bone density and structure, unspecified site: Secondary | ICD-10-CM

## 2011-06-17 LAB — URINALYSIS
Bilirubin Urine: NEGATIVE
Hgb urine dipstick: NEGATIVE
Ketones, ur: NEGATIVE
Leukocytes, UA: NEGATIVE
Nitrite: NEGATIVE
Specific Gravity, Urine: 1.025 (ref 1.000–1.030)
Total Protein, Urine: NEGATIVE
Urine Glucose: NEGATIVE
Urobilinogen, UA: 0.2 (ref 0.0–1.0)
pH: 6 (ref 5.0–8.0)

## 2011-06-17 LAB — VITAMIN B12: Vitamin B-12: 913 pg/mL — ABNORMAL HIGH (ref 211–911)

## 2011-06-17 LAB — CBC WITH DIFFERENTIAL/PLATELET
Basophils Absolute: 0 10*3/uL (ref 0.0–0.1)
Basophils Relative: 0.5 % (ref 0.0–3.0)
Eosinophils Absolute: 0.1 10*3/uL (ref 0.0–0.7)
Eosinophils Relative: 1.5 % (ref 0.0–5.0)
HCT: 41.6 % (ref 36.0–46.0)
Hemoglobin: 14.1 g/dL (ref 12.0–15.0)
Lymphocytes Relative: 23.2 % (ref 12.0–46.0)
Lymphs Abs: 1.5 10*3/uL (ref 0.7–4.0)
MCHC: 34 g/dL (ref 30.0–36.0)
MCV: 96.5 fl (ref 78.0–100.0)
Monocytes Absolute: 0.5 10*3/uL (ref 0.1–1.0)
Monocytes Relative: 8.2 % (ref 3.0–12.0)
Neutro Abs: 4.4 10*3/uL (ref 1.4–7.7)
Neutrophils Relative %: 66.6 % (ref 43.0–77.0)
Platelets: 279 10*3/uL (ref 150.0–400.0)
RBC: 4.31 Mil/uL (ref 3.87–5.11)
RDW: 13.5 % (ref 11.5–14.6)
WBC: 6.6 10*3/uL (ref 4.5–10.5)

## 2011-06-17 LAB — COMPREHENSIVE METABOLIC PANEL
ALT: 21 U/L (ref 0–35)
AST: 20 U/L (ref 0–37)
Albumin: 4.2 g/dL (ref 3.5–5.2)
Alkaline Phosphatase: 76 U/L (ref 39–117)
BUN: 22 mg/dL (ref 6–23)
CO2: 24 mEq/L (ref 19–32)
Calcium: 9 mg/dL (ref 8.4–10.5)
Chloride: 108 mEq/L (ref 96–112)
Creatinine, Ser: 0.6 mg/dL (ref 0.4–1.2)
GFR: 104.55 mL/min (ref >60.00)
Glucose, Bld: 104 mg/dL — ABNORMAL HIGH (ref 70–99)
Potassium: 4.1 mEq/L (ref 3.5–5.1)
Sodium: 140 mEq/L (ref 135–145)
Total Bilirubin: 0.8 mg/dL (ref 0.3–1.2)
Total Protein: 7.4 g/dL (ref 6.0–8.3)

## 2011-06-17 LAB — LIPID PANEL
Cholesterol: 211 mg/dL — ABNORMAL HIGH (ref 0–200)
HDL: 58.1 mg/dL (ref >39.00)
Total CHOL/HDL Ratio: 4
Triglycerides: 96 mg/dL (ref 0.0–149.0)
VLDL: 19.2 mg/dL (ref 0.0–40.0)

## 2011-06-17 LAB — CALCIUM: Calcium: 9 mg/dL (ref 8.4–10.5)

## 2011-06-17 LAB — TSH: TSH: 2.58 u[IU]/mL (ref 0.35–5.50)

## 2011-06-17 LAB — LDL CHOLESTEROL, DIRECT: Direct LDL: 161.3 mg/dL

## 2011-06-17 MED ORDER — ALPRAZOLAM 0.25 MG PO TABS: 0.2500 mg | ORAL_TABLET | Freq: Two times a day (BID) | ORAL | Status: DC | PRN

## 2011-06-17 MED ORDER — AMOXICILLIN 500 MG PO CAPS: 2000.0000 mg | ORAL_CAPSULE | Freq: Every day | ORAL | Status: DC

## 2011-06-17 MED ORDER — CELECOXIB 200 MG PO CAPS: 200.0000 mg | ORAL_CAPSULE | Freq: Every day | ORAL | Status: DC

## 2011-06-17 NOTE — Assessment & Plan Note (Signed)
Chronic Risks associated with treatment noncompliance were discussed. Compliance was encouraged. Re-start Rx 

## 2011-06-17 NOTE — Assessment & Plan Note (Signed)
  Chronic Risks associated with treatment noncompliance were discussed. Compliance was encouraged. Continue with current prescription therapy as reflected on the Med list.

## 2011-06-17 NOTE — Assessment & Plan Note (Signed)
Chronic LBP, hip pains, knees Continue with current prescription therapy as reflected on the Med list.

## 2011-06-18 LAB — VITAMIN D 25 HYDROXY (VIT D DEFICIENCY, FRACTURES): Vit D, 25-Hydroxy: 31 ng/mL (ref 30–89)

## 2011-06-19 NOTE — Progress Notes (Signed)
  Subjective:    Patient ID: [name], female    DOB: 1941/05/12, 70 y.o.   MRN: [id number]  HPI    The patient is here to follow up on chronic B12 def , anxiety, headaches and chronic moderate OA/LBP/hip pain symptoms controlled with medicines. She gets no exercise.  Review of Systems  Constitutional: Positive for fatigue. Negative for fever, chills, diaphoresis, activity change, appetite change and unexpected weight change.  HENT: Negative for hearing loss, ear pain, nosebleeds, congestion, sore throat, facial swelling, rhinorrhea, sneezing, mouth sores, trouble swallowing, neck pain, neck stiffness, postnasal drip, sinus pressure and tinnitus.   Eyes: Negative for pain, discharge, redness, itching and visual disturbance.  Respiratory: Negative for cough, chest tightness, shortness of breath, wheezing and stridor.   Cardiovascular: Negative for chest pain, palpitations and leg swelling.  Gastrointestinal: Negative for nausea, diarrhea, constipation, blood in stool, abdominal distention, anal bleeding and rectal pain.  Genitourinary: Negative for dysuria, urgency, frequency, hematuria, flank pain, vaginal bleeding, vaginal discharge, difficulty urinating, genital sores and pelvic pain.  Musculoskeletal: Positive for back pain, arthralgias and gait problem. Negative for joint swelling.  Skin: Negative.  Negative for rash.  Neurological: Negative for dizziness, tremors, seizures, syncope, speech difficulty, weakness, numbness and headaches.  Hematological: Negative for adenopathy. Does not bruise/bleed easily.  Psychiatric/Behavioral: Positive for sleep disturbance. Negative for suicidal ideas, behavioral problems, dysphoric mood and decreased concentration. The patient is nervous/anxious.        Objective:   Physical Exam  Constitutional: She appears well-developed. No distress.       Obese  HENT:  Head: Normocephalic.  Right Ear: External ear normal.  Left Ear: External ear  normal.  Nose: Nose normal.  Mouth/Throat: Oropharynx is clear and moist.  Eyes: Conjunctivae are normal. Pupils are equal, round, and reactive to light. Right eye exhibits no discharge. Left eye exhibits no discharge.  Neck: Normal range of motion. Neck supple. No JVD present. No tracheal deviation present. No thyromegaly present.  Cardiovascular: Normal rate, regular rhythm and normal heart sounds.   Pulmonary/Chest: No stridor. No respiratory distress. She has no wheezes.  Abdominal: Soft. Bowel sounds are normal. She exhibits no distension and no mass. There is no tenderness. There is no rebound and no guarding.  Musculoskeletal: She exhibits no edema and no tenderness (LS is tender).  Lymphadenopathy:    She has no cervical adenopathy.  Neurological: She displays normal reflexes. No cranial nerve deficit. She exhibits normal muscle tone. Coordination normal.  Skin: No rash noted. No erythema.  Psychiatric: Her behavior is normal. Judgment and thought content normal.   Lab Results  Component Value Date   WBC 6.6 06/17/2011   HGB 14.1 06/17/2011   HCT 41.6 06/17/2011   PLT 279.0 06/17/2011   GLUCOSE 104* 06/17/2011   CHOL 211* 06/17/2011   TRIG 96.0 06/17/2011   HDL 58.10 06/17/2011   LDLDIRECT 161.3 06/17/2011   ALT 21 06/17/2011   AST 20 06/17/2011   NA 140 06/17/2011   K 4.1 06/17/2011   CL 108 06/17/2011   CREATININE 0.6 06/17/2011   BUN 22 06/17/2011   CO2 24 06/17/2011   TSH 2.58 06/17/2011   INR 1.76* 08/29/2009          Assessment & Plan:

## 2011-06-19 NOTE — Assessment & Plan Note (Signed)
Trial of qd Celebrex

## 2011-06-20 LAB — PTH, INTACT AND CALCIUM
Calcium, Total (PTH): 9.3 mg/dL (ref 8.4–10.5)
PTH: 60.7 pg/mL (ref 14.0–72.0)

## 2011-06-22 ENCOUNTER — Telehealth: Payer: Self-pay | Admitting: Internal Medicine

## 2011-06-22 NOTE — Telephone Encounter (Signed)
Left mess for patient to call back.  

## 2011-06-22 NOTE — Telephone Encounter (Signed)
Pt aware of lab results and will double vit d.

## 2011-06-22 NOTE — Telephone Encounter (Signed)
[name], please, inform patient that all labs are normal except for [place] Vit D - double vit D Thx

## 2011-07-14 ENCOUNTER — Telehealth: Payer: Self-pay | Admitting: *Deleted

## 2011-07-14 MED ORDER — DIAZEPAM 5 MG PO TABS: 5.0000 mg | ORAL_TABLET | Freq: Two times a day (BID) | ORAL | Status: DC | PRN

## 2011-07-14 NOTE — Telephone Encounter (Signed)
OK to fill this prescription with additional refills x3 Thank you!  

## 2011-07-14 NOTE — Telephone Encounter (Signed)
Rf req for Diazepam 5 mg 1 po bid prn. # 60. Ok to Rf?

## 2011-08-05 ENCOUNTER — Other Ambulatory Visit: Payer: Self-pay | Admitting: Dermatology

## 2011-08-17 ENCOUNTER — Other Ambulatory Visit: Payer: Self-pay | Admitting: Dermatology

## 2011-08-30 ENCOUNTER — Ambulatory Visit (INDEPENDENT_AMBULATORY_CARE_PROVIDER_SITE_OTHER): Payer: Medicare Other | Admitting: Internal Medicine

## 2011-08-30 ENCOUNTER — Encounter: Payer: Self-pay | Admitting: Internal Medicine

## 2011-08-30 DIAGNOSIS — R413 Other amnesia: Secondary | ICD-10-CM

## 2011-08-30 DIAGNOSIS — R109 Unspecified abdominal pain: Secondary | ICD-10-CM

## 2011-08-30 DIAGNOSIS — E669 Obesity, unspecified: Secondary | ICD-10-CM

## 2011-08-30 DIAGNOSIS — E559 Vitamin D deficiency, unspecified: Secondary | ICD-10-CM

## 2011-08-30 DIAGNOSIS — F411 Generalized anxiety disorder: Secondary | ICD-10-CM

## 2011-08-30 MED ORDER — CELECOXIB 200 MG PO CAPS: 200.0000 mg | ORAL_CAPSULE | Freq: Every day | ORAL | Status: DC

## 2011-08-30 NOTE — Assessment & Plan Note (Signed)
MSK and OA related

## 2011-08-30 NOTE — Assessment & Plan Note (Signed)
Continue with current prescription therapy as reflected on the Med list.  

## 2011-08-30 NOTE — Progress Notes (Signed)
Patient ID: [name], female   DOB: 01/30/1941, 71 y.o.   MRN: [id number]  Subjective:    Patient ID: [name], female    DOB: February 01, 1941, 71 y.o.   MRN: [id number]  HPI    The patient is here to follow up on chronic B12 def , anxiety, headaches and chronic moderate OA/LBP/hip pain symptoms controlled with medicines. She gets no exercise.  Review of Systems  Constitutional: Positive for fatigue. Negative for fever, chills, diaphoresis, activity change, appetite change and unexpected weight change.  HENT: Negative for hearing loss, ear pain, nosebleeds, congestion, sore throat, facial swelling, rhinorrhea, sneezing, mouth sores, trouble swallowing, neck pain, neck stiffness, postnasal drip, sinus pressure and tinnitus.   Eyes: Negative for pain, discharge, redness, itching and visual disturbance.  Respiratory: Negative for cough, chest tightness, shortness of breath, wheezing and stridor.   Cardiovascular: Negative for chest pain, palpitations and leg swelling.  Gastrointestinal: Negative for nausea, diarrhea, constipation, blood in stool, abdominal distention, anal bleeding and rectal pain.  Genitourinary: Negative for dysuria, urgency, frequency, hematuria, flank pain, vaginal bleeding, vaginal discharge, difficulty urinating, genital sores and pelvic pain.  Musculoskeletal: Positive for back pain, arthralgias and gait problem. Negative for joint swelling.  Skin: Negative.  Negative for rash.  Neurological: Negative for dizziness, tremors, seizures, syncope, speech difficulty, weakness, numbness and headaches.  Hematological: Negative for adenopathy. Does not bruise/bleed easily.  Psychiatric/Behavioral: Positive for sleep disturbance. Negative for suicidal ideas, behavioral problems, dysphoric mood and decreased concentration. The patient is nervous/anxious.        Objective:   Physical Exam  Constitutional: She appears well-developed. No distress.       Obese  HENT:    Head: Normocephalic.  Right Ear: External ear normal.  Left Ear: External ear normal.  Nose: Nose normal.  Mouth/Throat: Oropharynx is clear and moist.  Eyes: Conjunctivae are normal. Pupils are equal, round, and reactive to light. Right eye exhibits no discharge. Left eye exhibits no discharge.  Neck: Normal range of motion. Neck supple. No JVD present. No tracheal deviation present. No thyromegaly present.  Cardiovascular: Normal rate, regular rhythm and normal heart sounds.   Pulmonary/Chest: No stridor. No respiratory distress. She has no wheezes.  Abdominal: Soft. Bowel sounds are normal. She exhibits no distension and no mass. There is no tenderness. There is no rebound and no guarding.  Musculoskeletal: She exhibits no edema and no tenderness (LS is tender).  Lymphadenopathy:    She has no cervical adenopathy.  Neurological: She displays normal reflexes. No cranial nerve deficit. She exhibits normal muscle tone. Coordination normal.  Skin: No rash noted. No erythema.  Psychiatric: Her behavior is normal. Judgment and thought content normal.   Lab Results  Component Value Date   WBC 6.6 06/17/2011   HGB 14.1 06/17/2011   HCT 41.6 06/17/2011   PLT 279.0 06/17/2011   GLUCOSE 104* 06/17/2011   CHOL 211* 06/17/2011   TRIG 96.0 06/17/2011   HDL 58.10 06/17/2011   LDLDIRECT 161.3 06/17/2011   ALT 21 06/17/2011   AST 20 06/17/2011   NA 140 06/17/2011   K 4.1 06/17/2011   CL 108 06/17/2011   CREATININE 0.6 06/17/2011   BUN 22 06/17/2011   CO2 24 06/17/2011   TSH 2.58 06/17/2011   INR 1.76* 08/29/2009          Assessment & Plan:

## 2011-08-30 NOTE — Assessment & Plan Note (Signed)
Seems to be better with better pain control (Celebrex)

## 2011-12-19 ENCOUNTER — Encounter: Payer: Self-pay | Admitting: Internal Medicine

## 2011-12-19 ENCOUNTER — Ambulatory Visit (INDEPENDENT_AMBULATORY_CARE_PROVIDER_SITE_OTHER): Payer: Medicare Other | Admitting: Internal Medicine

## 2011-12-19 VITALS — BP 134/80 | HR 88 | Temp 97.5°F | Resp 16 | Wt 200.0 lb

## 2011-12-19 DIAGNOSIS — R413 Other amnesia: Secondary | ICD-10-CM

## 2011-12-19 DIAGNOSIS — M1990 Unspecified osteoarthritis, unspecified site: Secondary | ICD-10-CM

## 2011-12-19 DIAGNOSIS — M545 Low back pain, unspecified: Secondary | ICD-10-CM

## 2011-12-19 DIAGNOSIS — D518 Other vitamin B12 deficiency anemias: Secondary | ICD-10-CM

## 2011-12-19 DIAGNOSIS — F411 Generalized anxiety disorder: Secondary | ICD-10-CM

## 2011-12-19 NOTE — Assessment & Plan Note (Signed)
Continue with current prescription therapy as reflected on the Med list.  

## 2011-12-19 NOTE — Assessment & Plan Note (Signed)
Continue with current prescription therapy prn as reflected on the Med list.  

## 2011-12-19 NOTE — Assessment & Plan Note (Signed)
Doing better. Stress is aggravating

## 2011-12-19 NOTE — Assessment & Plan Note (Signed)
Continue with current prescription therapy as reflected on the Med list. On celebrex

## 2011-12-19 NOTE — Progress Notes (Signed)
Patient ID: [name], female   DOB: [birth year], 71 y.o.   MRN: [id number] Patient ID: [name], female   DOB: 01-18-1941, 72 y.o.   MRN: [id number]  Subjective:    Patient ID: [name], female    DOB: 10/30/1940, 71 y.o.   MRN: [id number]  HPI    The patient is here to follow up on chronic B12 def , anxiety, headaches and chronic moderate OA/LBP/hip pain symptoms controlled with medicines. She gets no exercise.  Wt Readings from Last 3 Encounters:  12/19/11 200 lb (90.719 kg)  08/30/11 199 lb (90.266 kg)  06/17/11 198 lb (89.812 kg)   BP Readings from Last 3 Encounters:  12/19/11 134/80  08/30/11 138/74  06/17/11 130/80      Review of Systems  Constitutional: Positive for fatigue. Negative for fever, chills, diaphoresis, activity change, appetite change and unexpected weight change.  HENT: Negative for hearing loss, ear pain, nosebleeds, congestion, sore throat, facial swelling, rhinorrhea, sneezing, mouth sores, trouble swallowing, neck pain, neck stiffness, postnasal drip, sinus pressure and tinnitus.   Eyes: Negative for pain, discharge, redness, itching and visual disturbance.  Respiratory: Negative for cough, chest tightness, shortness of breath, wheezing and stridor.   Cardiovascular: Negative for chest pain, palpitations and leg swelling.  Gastrointestinal: Negative for nausea, diarrhea, constipation, blood in stool, abdominal distention, anal bleeding and rectal pain.  Genitourinary: Negative for dysuria, urgency, frequency, hematuria, flank pain, vaginal bleeding, vaginal discharge, difficulty urinating, genital sores and pelvic pain.  Musculoskeletal: Positive for back pain, arthralgias and gait problem. Negative for joint swelling.  Skin: Negative.  Negative for rash.  Neurological: Negative for dizziness, tremors, seizures, syncope, speech difficulty, weakness, numbness and headaches.  Hematological: Negative for adenopathy. Does not bruise/bleed  easily.  Psychiatric/Behavioral: Positive for sleep disturbance. Negative for suicidal ideas, behavioral problems, dysphoric mood and decreased concentration. The patient is nervous/anxious.        Objective:   Physical Exam  Constitutional: She appears well-developed. No distress.       Obese  HENT:  Head: Normocephalic.  Right Ear: External ear normal.  Left Ear: External ear normal.  Nose: Nose normal.  Mouth/Throat: Oropharynx is clear and moist.  Eyes: Conjunctivae are normal. Pupils are equal, round, and reactive to light. Right eye exhibits no discharge. Left eye exhibits no discharge.  Neck: Normal range of motion. Neck supple. No JVD present. No tracheal deviation present. No thyromegaly present.  Cardiovascular: Normal rate, regular rhythm and normal heart sounds.   Pulmonary/Chest: No stridor. No respiratory distress. She has no wheezes.  Abdominal: Soft. Bowel sounds are normal. She exhibits no distension and no mass. There is no tenderness. There is no rebound and no guarding.  Musculoskeletal: She exhibits no edema and no tenderness (LS is tender).  Lymphadenopathy:    She has no cervical adenopathy.  Neurological: She displays normal reflexes. No cranial nerve deficit. She exhibits normal muscle tone. Coordination normal.  Skin: No rash noted. No erythema.  Psychiatric: Her behavior is normal. Judgment and thought content normal.   Lab Results  Component Value Date   WBC 6.6 06/17/2011   HGB 14.1 06/17/2011   HCT 41.6 06/17/2011   PLT 279.0 06/17/2011   GLUCOSE 104* 06/17/2011   CHOL 211* 06/17/2011   TRIG 96.0 06/17/2011   HDL 58.10 06/17/2011   LDLDIRECT 161.3 06/17/2011   ALT 21 06/17/2011   AST 20 06/17/2011   NA 140 06/17/2011   K 4.1 06/17/2011   CL 108  06/17/2011   CREATININE 0.6 06/17/2011   BUN 22 06/17/2011   CO2 24 06/17/2011   TSH 2.58 06/17/2011   INR 1.76* 08/29/2009          Assessment & Plan:

## 2012-03-19 ENCOUNTER — Encounter: Payer: Self-pay | Admitting: Internal Medicine

## 2012-03-19 ENCOUNTER — Ambulatory Visit (INDEPENDENT_AMBULATORY_CARE_PROVIDER_SITE_OTHER): Payer: Medicare Other | Admitting: Internal Medicine

## 2012-03-19 VITALS — BP 118/82 | HR 80 | Temp 98.2°F | Resp 16 | Wt 206.0 lb

## 2012-03-19 DIAGNOSIS — E669 Obesity, unspecified: Secondary | ICD-10-CM

## 2012-03-19 DIAGNOSIS — E559 Vitamin D deficiency, unspecified: Secondary | ICD-10-CM

## 2012-03-19 DIAGNOSIS — D518 Other vitamin B12 deficiency anemias: Secondary | ICD-10-CM

## 2012-03-19 DIAGNOSIS — M545 Low back pain, unspecified: Secondary | ICD-10-CM

## 2012-03-19 MED ORDER — OXYCODONE-ACETAMINOPHEN 5-325 MG PO TABS: 1.0000 | ORAL_TABLET | Freq: Three times a day (TID) | ORAL | Status: AC | PRN

## 2012-03-19 MED ORDER — DULOXETINE HCL 30 MG PO CPEP: 30.0000 mg | ORAL_CAPSULE | Freq: Every day | ORAL | Status: DC

## 2012-03-19 MED ORDER — CELECOXIB 200 MG PO CAPS: 200.0000 mg | ORAL_CAPSULE | Freq: Every day | ORAL | Status: DC

## 2012-03-19 NOTE — Assessment & Plan Note (Signed)
Discussed.

## 2012-03-19 NOTE — Assessment & Plan Note (Signed)
Continue with current prescription therapy as reflected on the Med list.  

## 2012-03-19 NOTE — Assessment & Plan Note (Signed)
Chronic Exacerbation 8/13

## 2012-03-19 NOTE — Progress Notes (Signed)
Subjective:    Patient ID: [name], female    DOB: [birth year], 71 y.o.   MRN: [id number]  HPI  C/o R low back pain x 2 wks after lifting a cat litter bag; she is seeing a chiropractor --moderate 3-4/10  The patient is here to follow up on chronic B12 def , anxiety, headaches and chronic moderate OA/LBP/hip pain symptoms controlled with medicines. She gets no exercise.  Wt Readings from Last 3 Encounters:  03/19/12 206 lb (93.441 kg)  12/19/11 200 lb (90.719 kg)  08/30/11 199 lb (90.266 kg)   BP Readings from Last 3 Encounters:  03/19/12 118/82  12/19/11 134/80  08/30/11 138/74      Review of Systems  Constitutional: Positive for fatigue. Negative for fever, chills, diaphoresis, activity change, appetite change and unexpected weight change.  HENT: Negative for hearing loss, ear pain, nosebleeds, congestion, sore throat, facial swelling, rhinorrhea, sneezing, mouth sores, trouble swallowing, neck pain, neck stiffness, postnasal drip, sinus pressure and tinnitus.   Eyes: Negative for pain, discharge, redness, itching and visual disturbance.  Respiratory: Negative for cough, chest tightness, shortness of breath, wheezing and stridor.   Cardiovascular: Negative for chest pain, palpitations and leg swelling.  Gastrointestinal: Negative for nausea, diarrhea, constipation, blood in stool, abdominal distention, anal bleeding and rectal pain.  Genitourinary: Negative for dysuria, urgency, frequency, hematuria, flank pain, vaginal bleeding, vaginal discharge, difficulty urinating, genital sores and pelvic pain.  Musculoskeletal: Positive for back pain, arthralgias and gait problem. Negative for joint swelling.  Skin: Negative.  Negative for rash.  Neurological: Negative for dizziness, tremors, seizures, syncope, speech difficulty, weakness, numbness and headaches.  Hematological: Negative for adenopathy. Does not bruise/bleed easily.  Psychiatric/Behavioral: Positive for disturbed  wake/sleep cycle. Negative for suicidal ideas, behavioral problems, dysphoric mood and decreased concentration. The patient is nervous/anxious.        Objective:   Physical Exam  Constitutional: She appears well-developed. No distress.       Obese  HENT:  Head: Normocephalic.  Right Ear: External ear normal.  Left Ear: External ear normal.  Nose: Nose normal.  Mouth/Throat: Oropharynx is clear and moist.  Eyes: Conjunctivae are normal. Pupils are equal, round, and reactive to light. Right eye exhibits no discharge. Left eye exhibits no discharge.  Neck: Normal range of motion. Neck supple. No JVD present. No tracheal deviation present. No thyromegaly present.  Cardiovascular: Normal rate, regular rhythm and normal heart sounds.   Pulmonary/Chest: No stridor. No respiratory distress. She has no wheezes.  Abdominal: Soft. Bowel sounds are normal. She exhibits no distension and no mass. There is no tenderness. There is no rebound and no guarding.  Musculoskeletal: She exhibits no edema and no tenderness (LS is tender).  Lymphadenopathy:    She has no cervical adenopathy.  Neurological: She displays normal reflexes. No cranial nerve deficit. She exhibits normal muscle tone. Coordination normal.  Skin: No rash noted. No erythema.  Psychiatric: Her behavior is normal. Judgment and thought content normal.   Lab Results  Component Value Date   WBC 6.6 06/17/2011   HGB 14.1 06/17/2011   HCT 41.6 06/17/2011   PLT 279.0 06/17/2011   GLUCOSE 104* 06/17/2011   CHOL 211* 06/17/2011   TRIG 96.0 06/17/2011   HDL 58.10 06/17/2011   LDLDIRECT 161.3 06/17/2011   ALT 21 06/17/2011   AST 20 06/17/2011   NA 140 06/17/2011   K 4.1 06/17/2011   CL 108 06/17/2011   CREATININE 0.6 06/17/2011   BUN 22 06/17/2011  CO2 24 06/17/2011   TSH 2.58 06/17/2011   INR 1.76* 08/29/2009          Assessment & Plan:

## 2012-03-22 ENCOUNTER — Telehealth: Payer: Self-pay | Admitting: Internal Medicine

## 2012-03-22 NOTE — Telephone Encounter (Signed)
Caller: Tina/Patient; Phone: [phone]; Reason for Call: Caller: Ferol/Patient; Patient Name: [name]; PCP: Sonda Primes; Best Callback Phone Number: [phone].    Patient states she was seen in office 03/19/12 and prescribed Cymbalta.  Patient states she developed diarrhea, onset 03/19/12 after taking inital dosage of Cymbalta.  States she developed headache, dizziness, nausea, dry mouth, lethargy 03/20/12.  No vomiting.  Afebrile.  States headache unrelieved by Tylenol.  Patient denies vomiting.  Afebrile.  Patient states she is not going to take Cymbalta due to above side effects.  Denies diarrhea 03/20/12.  Triage per Headache Protocol.  No emergent symptoms identified.  Care advice given per guidelines.  Patient advised increased fluids, change positions slowly.  Patient declines appointment.  Call back parameters reviewed.  Patient verbalizes understanding.   PATIENT CALLING TO INFORM DR.  PLOTNIKOV THAT SHE IS DISCONTINUING CYMBALTA DUE TO ABOVE SYMPTOMS.  PATIENT USES [facility] IN [address] AT [phone].  PATIENT CAN BE REACHED AT [phone].

## 2012-03-22 NOTE — Telephone Encounter (Signed)
D/c Cybalta Thx

## 2012-04-23 ENCOUNTER — Encounter: Payer: Self-pay | Admitting: Internal Medicine

## 2012-04-27 ENCOUNTER — Encounter: Payer: Medicare Other | Admitting: Gynecology

## 2012-05-01 ENCOUNTER — Encounter: Payer: Self-pay | Admitting: Gynecology

## 2012-05-02 ENCOUNTER — Encounter: Payer: Self-pay | Admitting: Gynecology

## 2012-05-02 ENCOUNTER — Ambulatory Visit (INDEPENDENT_AMBULATORY_CARE_PROVIDER_SITE_OTHER): Payer: Medicare Other | Admitting: Gynecology

## 2012-05-02 VITALS — BP 122/82 | Ht 63.75 in | Wt 202.0 lb

## 2012-05-02 DIAGNOSIS — L293 Anogenital pruritus, unspecified: Secondary | ICD-10-CM

## 2012-05-02 DIAGNOSIS — R635 Abnormal weight gain: Secondary | ICD-10-CM

## 2012-05-02 DIAGNOSIS — N952 Postmenopausal atrophic vaginitis: Secondary | ICD-10-CM

## 2012-05-02 DIAGNOSIS — N898 Other specified noninflammatory disorders of vagina: Secondary | ICD-10-CM

## 2012-05-02 DIAGNOSIS — M899 Disorder of bone, unspecified: Secondary | ICD-10-CM

## 2012-05-02 DIAGNOSIS — N760 Acute vaginitis: Secondary | ICD-10-CM

## 2012-05-02 DIAGNOSIS — M8580 Other specified disorders of bone density and structure, unspecified site: Secondary | ICD-10-CM

## 2012-05-02 LAB — WET PREP FOR TRICH, YEAST, CLUE
Clue Cells Wet Prep HPF POC: NONE SEEN
Trich, Wet Prep: NONE SEEN
WBC, Wet Prep HPF POC: NONE SEEN
Yeast Wet Prep HPF POC: NONE SEEN

## 2012-05-02 MED ORDER — TERCONAZOLE 0.4 % VA CREA: 1.0000 | TOPICAL_CREAM | Freq: Every day | VAGINAL | Status: DC

## 2012-05-02 MED ORDER — NONFORMULARY OR COMPOUNDED ITEM: Status: DC

## 2012-05-02 MED ORDER — CLOBETASOL PROPIONATE 0.05 % EX CREA: TOPICAL_CREAM | Freq: Two times a day (BID) | CUTANEOUS | Status: DC

## 2012-05-02 NOTE — Progress Notes (Signed)
[name] Sep 08, 1940 [id number]   History:    71 y.o.  who presented to the office today complaining of vaginal pruritus. Patient with past history of osteopenia and has had past history of vitamin D deficiency. She is being followed by Dr. Posey Rea who has done her lab work. 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 She had a breast reduction in the year 2000. 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 She has had a cervical discectomy C2-C3, C3-C4 in 1995 and 2009 respectively. She in frequently does her monthly self breast exam. She sees Dr. [place] for yearly mole checks. Patient's last bone density study was done in our office in 2012 in the distal one third forearm was utilized for analysis because of the patient's history of bilateral hip replacement. Her lowest T score was -1.8. We had ordered last year a calcium, vitamin D, and PTH were all normal.  Patient with history of supracervical hysterectomy October 2005 for adenomyosis and benign fibroids  Past medical history,surgical history, family history and social history were all reviewed and documented in the EPIC chart.  Gynecologic History No LMP recorded. Patient has had a hysterectomy. Contraception: none and Postmenopausal Last Pap: 2012. Results were: normal Last mammogram: 2013. Results were: normal  Obstetric History OB History    Grav Para Term Preterm Abortions TAB SAB Ect Mult Living   2 1   1 1    1      # Outc Date GA Lbr Len/2nd Wgt Sex Del Anes PTL Lv   1 PAR     M SVD      2 TAB                ROS: A ROS was performed and pertinent positives and negatives are included in the history.  GENERAL: No fevers or chills. HEENT: No change in vision, no earache, sore throat or sinus congestion. NECK: No pain or stiffness. CARDIOVASCULAR:  No chest pain or pressure. No palpitations. PULMONARY: No shortness of breath, cough or wheeze. GASTROINTESTINAL: No abdominal pain, nausea, vomiting or diarrhea, melena or bright red blood per rectum. GENITOURINARY: No urinary frequency, urgency, hesitancy or dysuria. MUSCULOSKELETAL: No joint or muscle pain, no back pain, no recent trauma. DERMATOLOGIC: No rash, no itching, no lesions. ENDOCRINE: No polyuria, polydipsia, no heat or cold intolerance. No recent change in weight. HEMATOLOGICAL: No anemia or easy bruising or bleeding. NEUROLOGIC: No headache, seizures, numbness, tingling or weakness. PSYCHIATRIC: No depression, no loss of interest in normal activity or change in sleep pattern.     Exam: chaperone present  BP 122/82  Ht 5' 3.75" (1.619 m)  Wt 202 lb (91.627 kg)  BMI 34.95 kg/m2  Body mass index is 34.95 kg/(m^2).  General appearance : Well developed well nourished female. No acute distress HEENT: Neck supple, trachea midline, no carotid bruits, no thyroidmegaly Lungs: Clear to auscultation, no rhonchi or wheezes, or rib retractions  Heart: Regular rate and rhythm, no murmurs or gallops Breast:Examined in sitting and supine position were symmetrical in appearance, no palpable masses or tenderness,  no skin retraction, no nipple inversion, no nipple discharge, no skin discoloration, no axillary or supraclavicular lymphadenopathy Abdomen: no palpable masses or tenderness, no rebound or guarding Extremities: no edema or skin discoloration or tenderness  Pelvic:  Bartholin,  Urethra, Skene Glands: Within normal limits             Vagina: No gross lesions or discharge  Cervix: No gross lesions or discharge  Uterus absent Adnexa  Without masses or tenderness  Anus and perineum  normal   Rectovaginal  normal sphincter tone without palpated masses or tenderness             Hemoccult cards provided   External genital excoriated reddened area throughout. A previous biopsy site for  many years ago was noted was some scarring in the right upper outer right labia majora.  Assessment/Plan:  71 y.o. female for annual exam who will be placed on clobetasol 0.05% to apply twice a day for 2 weeks and to return to the office for followup. She may need a biopsy from this area. The wet prep was negative for any evidence of moniliasis. She will be prescribe for her vaginal atrophy estradiol 0.02% 1 mL prefilled applicators to apply twice vaginally that she'll be able to purchase at the compound pharmacy. Hemoccult cards were provided her to symmetrical the office for testing. She was reminded the importance of monthly breast exams. We discussed also the importance of calcium and vitamin D and regular exercise for osteoporosis prevention. She does engage on water aerobics twice a week.    Ok Edwards MD, 1:45 PM 05/02/2012

## 2012-05-02 NOTE — Patient Instructions (Addendum)
Hormone Therapy At menopause, your body begins making less estrogen and progesterone hormones. This causes the body to stop having menstrual periods. This is because estrogen and progesterone hormones control your periods and menstrual cycle. A lack of estrogen may cause symptoms such as:  Hot flushes (or hot flashes).  Vaginal dryness.  Dry skin.  Loss of sex drive.  Risk of bone loss (osteoporosis). When this happens, you may choose to take hormone therapy to get back the estrogen lost during menopause. When the hormone estrogen is given alone, it is usually referred to as ET (Estrogen Therapy). When the hormone progestin is combined with estrogen, it is generally called HT (Hormone Therapy). This was formerly known as hormone replacement therapy (HRT). Your caregiver can help you make a decision on what will be best for you. The decision to use HT seems to change often as new studies are done. Many studies do not agree on the benefits of hormone replacement therapy. LIKELY BENEFITS OF HT INCLUDE PROTECTION FROM:  Hot Flushes (also called hot flashes) - A hot flush is a sudden feeling of heat that spreads over the face and body. The skin may redden like a blush. It is connected with sweats and sleep disturbance. Women going through menopause may have hot flushes a few times a month or several times per day depending on the woman.  Osteoporosis (bone loss)- Estrogen helps guard against bone loss. After menopause, a woman's bones slowly lose calcium and become weak and brittle. As a result, bones are more likely to break. The hip, wrist, and spine are affected most often. Hormone therapy can help slow bone loss after menopause. Weight bearing exercise and taking calcium with vitamin D also can help prevent bone loss. There are also medications that your caregiver can prescribe that can help prevent osteoporosis.  Vaginal Dryness - Loss of estrogen causes changes in the vagina. Its lining may  become thin and dry. These changes can cause pain and bleeding during sexual intercourse. Dryness can also lead to infections. This can cause burning and itching. (Vaginal estrogen treatment can help relieve pain, itching, and dryness.)  Urinary Tract Infections are more common after menopause because of lack of estrogen. Some women also develop urinary incontinence because of low estrogen levels in the vagina and bladder.  Possible other benefits of estrogen include a positive effect on mood and short-term memory in women. RISKS AND COMPLICATIONS  Using estrogen alone without progesterone causes the lining of the uterus to grow. This increases the risk of lining of the uterus (endometrial) cancer. Your caregiver should give another hormone called progestin if you have a uterus.  Women who take combined (estrogen and progestin) HT appear to have an increased risk of breast cancer. The risk appears to be small, but increases throughout the time that HT is taken.  Combined therapy also makes the breast tissue slightly denser which makes it harder to read mammograms (breast X-rays).  Combined, estrogen and progesterone therapy can be taken together every day, in which case there may be spotting of blood. HT therapy can be taken cyclically in which case you will have menstrual periods. Cyclically means HT is taken for a set amount of days, then not taken, then this process is repeated.  HT may increase the risk of stroke, heart attack, breast cancer and forming blood clots in your leg.  Transdermal estrogen (estrogen that is absorbed through the skin with a patch or a cream) may have more positive results with:    Cholesterol.  Blood pressure.  Blood clots. Having the following conditions may indicate you should not have HT:  Endometrial cancer.  Liver disease.  Breast cancer.  Heart disease.  History of blood clots.  Stroke. TREATMENT   If you choose to take HT and have a uterus,  usually estrogen and progestin are prescribed.  Your caregiver will help you decide the best way to take the medications.  Possible ways to take estrogen include:  Pills.  Patches.  Gels.  Sprays.  Vaginal estrogen cream, rings and tablets.  It is best to take the lowest dose possible that will help your symptoms and take them for the shortest period of time that you can.  Hormone therapy can help relieve some of the problems (symptoms) that affect women at menopause. Before making a decision about HT, talk to your caregiver about what is best for you. Be well informed and comfortable with your decisions. HOME CARE INSTRUCTIONS   Follow your caregivers advice when taking the medications.  A Pap test is done to screen for cervical cancer.  The first Pap test should be done at age 58.  Between ages 26 and 71, Pap tests are repeated every 2 years.  Beginning at age 66, you are advised to have a Pap test every 3 years as long as your past 3 Pap tests have been normal.  Some women have medical problems that increase the chance of getting cervical cancer. Talk to your caregiver about these problems. It is especially important to talk to your caregiver if a new problem develops soon after your last Pap test. In these cases, your caregiver may recommend more frequent screening and Pap tests.  The above recommendations are the same for women who have or have not gotten the vaccine for HPV (Human Papillomavirus).  If you had a hysterectomy for a problem that was not a cancer or a condition that could lead to cancer, then you no longer need Pap tests. However, even if you no longer need a Pap test, a regular exam is a good idea to make sure no other problems are starting.   If you are between ages 73 and 26, and you have had normal Pap tests going back 10 years, you no longer need Pap tests. However, even if you no longer need a Pap test, a regular exam is a good idea to make sure no  other problems are starting.   If you have had past treatment for cervical cancer or a condition that could lead to cancer, you need Pap tests and screening for cancer for at least 20 years after your treatment.  If Pap tests have been discontinued, risk factors (such as a new sexual partner) need to be re-assessed to determine if screening should be resumed.  Some women may need screenings more often if they are at high risk for cervical cancer.  Get mammograms done as per the advice of your caregiver. SEEK IMMEDIATE MEDICAL CARE IF:  You develop abnormal vaginal bleeding.  You have pain or swelling in your legs, shortness of breath, or chest pain.  You develop dizziness or headaches.  You have lumps or changes in your breasts or armpits.  You have slurred speech.  You develop weakness or numbness of your arms or legs.  You have pain, burning, or bleeding when urinating.  You develop abdominal pain. Document Released: 04/16/2003 Document Revised: 10/10/2011 Document Reviewed: 08/04/2010 [facility] Patient Information 2013 [place], [place].  Candidal Vulvovaginitis Candidal vulvovaginitis is  an infection of the vagina and vulva. The vulva is the skin around the opening of the vagina. This may cause itching and discomfort in and around the vagina.  HOME CARE  Only take medicine as told by your doctor.  Do not have sex (intercourse) until the infection is healed or as told by your doctor.  Practice safe sex.  Tell your sex partner about your infection.  Do not douche or use tampons.  Wear cotton underwear. Do not wear tight pants or panty hose.  Eat yogurt. This may help treat and prevent yeast infections. GET HELP RIGHT AWAY IF:   You have a fever.  Your problems get worse during treatment or do not get better in 3 days.  You have discomfort, irritation, or itching in your vagina or vulva area.  You have pain after sex.  You start to get belly (abdominal)  pain. MAKE SURE YOU:  Understand these instructions.  Will watch your condition.  Will get help right away if you are not doing well or get worse. Document Released: 10/14/2008 Document Revised: 10/10/2011 Document Reviewed: 10/14/2008 [facility] Patient Information 2013 [place], [place].

## 2012-05-16 ENCOUNTER — Encounter: Payer: Self-pay | Admitting: Gynecology

## 2012-05-16 ENCOUNTER — Ambulatory Visit (INDEPENDENT_AMBULATORY_CARE_PROVIDER_SITE_OTHER): Payer: Medicare Other | Admitting: Gynecology

## 2012-05-16 VITALS — BP 140/86

## 2012-05-16 DIAGNOSIS — N760 Acute vaginitis: Secondary | ICD-10-CM

## 2012-05-16 DIAGNOSIS — N898 Other specified noninflammatory disorders of vagina: Secondary | ICD-10-CM

## 2012-05-16 DIAGNOSIS — Z23 Encounter for immunization: Secondary | ICD-10-CM

## 2012-05-16 DIAGNOSIS — L293 Anogenital pruritus, unspecified: Secondary | ICD-10-CM

## 2012-05-16 DIAGNOSIS — N9089 Other specified noninflammatory disorders of vulva and perineum: Secondary | ICD-10-CM

## 2012-05-16 MED ORDER — CLOBETASOL PROPIONATE 0.05 % EX CREA: TOPICAL_CREAM | CUTANEOUS | Status: DC

## 2012-05-16 NOTE — Addendum Note (Signed)
Addended by: Bertram Savin A on: 05/16/2012 12:53 PM   Modules accepted: Orders

## 2012-05-16 NOTE — Progress Notes (Signed)
71 y.o. 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 She was prescribe for her vaginal atrophy estradiol 0.02% 1 mL prefilled applicators to apply twice weekly vaginally that she purchased at the compound pharmacy. Exam: Physical Exam  Genitourinary:      On further questioning the patient stated that she has always had this reddened area on her external genitalia. There several pale areas in the superior portion of the right labia majora from what appears to have been prior biopsies by another provider? Patient stated that for several years in the past she had suffered from folliculitis. The area at the right upper labia majora was cleansed with Betadine solution and 1% lidocaine was infiltrated subcutaneously and a keypunch biopsy was obtained and tissue was submitted for histological evaluation. Literature information on lichen sclerosis had been provided. She will continue to apply the clobetasol each bedtime for 2 months and then return to the office for followup. For her vaginal atrophy she'll continue with the twice a week estradiol cream application.

## 2012-05-16 NOTE — Patient Instructions (Signed)
Patient information: Lichen sclerosus (Beyond the Basics)  Author Yvetta Coder, MD Section Editors Lysbeth Galas, MD Thora Lance, MD, PhD, [facility] Deputy Editor Toni Amend, MD Disclosures  All topics are updated as new evidence becomes available and our peer review process is complete.  Literature review current through: Sep 2013.  This topic last updated: Jan 24, 2012.  INTRODUCTION - Lichen sclerosus (LS) is a skin disorder that causes the skin to become thin, whitened, wrinkled, and can cause itching and pain. LS usually occurs in postmenopausal women, although men, children, and premenopausal women may be affected. It can develop on any skin surface, but in women it most commonly occurs near the clitoris, on the labia (the inner and outer genital lips), and in the anal region (figure 1). In 15 to 20 percent of patients, LS lesions develop on other skin surfaces, such as the thighs, breasts, wrists, shoulders, neck, and even the inside the mouth. It is not clear exactly how many people have LS. Estimates for LS involving the female genitals vary from 1 in 58 elderly women seen in general gynecology offices to 1 in 300 to 1000 patients referred to dermatologists. LICHEN SCLEROSUS CAUSES AND RISK FACTORS - The cause of lichen sclerosus (LS) is not clear; healthcare providers suspect that a number of factors may be involved. Genetic factors - LS seems to be more common in some families. People who are genetically predisposed to LS may develop symptoms after experiencing trauma, injury, or sexual abuse. Disorders of the immune system - People with LS are at a greater risk of developing autoimmune disorders, which develop when the body's immune system mistakenly attacks and injures normal body tissues. These may include some types of thyroid disease, anemia, diabetes, alopecia areata, and vitiligo.  Infections - Researchers have tried to identify an infectious organism as a  cause of LS, but no clear data have shown that there is an infectious source [1]. LS is not contagious. LICHEN SCLEROSUS (LS) SIGNS AND SYMPTOMS Features of genital LS in women - Some women with LS feel dull, painful discomfort in the vulva, while other women with LS have no symptoms. The most common symptoms include: Vulvar itching - The most common symptom of LS is itching. It may be so severe that it interferes with sleep.  Anal itching, fissures, bleeding, and pain (see "Patient information: Anal fissure (Beyond the Basics)")  Painful sexual intercourse (dyspareunia) - This can occur as a result of repeated cracking of the skin (fissuring) or from narrowing of the vaginal opening due to scarring. Typically, women with LS have thin, white, wrinkled skin on the labia, often extending down and around the anus (figure 1). Purple-colored areas of bruising may be seen. Cracks (also known as fissures) may form in the skin in the area around the anus, the labia, and the clitoris. Relatively minor rubbing or sex may lead to bleeding due to the fragility of the involved skin. If lichen sclerosus is not treated, it may progress and change the appearance of the genital area as the outer and inner lips of the vulva fuse (stick together) and bury the clitoris. The opening of the vagina can become narrowed, and cracks, fissures, and thickened, scarred skin in the genital and anal area can make sexual intercourse or genital examination painful. LS does not affect the inner reproductive organs, such as the vagina and uterus. Features of genital LS in men - In men, lichen sclerosus may appear on the head  of the penis. Men who develop lichen sclerosus are usually uncircumcised (they have not had the foreskin of the penis removed), and the foreskin can become tight, shrunken, and scarred over the head of the penis. Men with lichen sclerosus may also have problems pulling back the foreskin and may experience decreased  sensation at the tip of the penis, painful erections, or problems with urination [2]. Features of LS in other areas - LS may also cause lesions to occur in areas outside the genitals, especially the upper body, breasts, and upper arms. These lesions tend to be white, flat or raised, and are not as itchy as the affected skin of the genitals and anus. LICHEN SCLEROSUS DIAGNOSIS - Providers typically use the following methods to diagnose lichen sclerosus (LS). History and physical examination - A medical history and physical examination of the vulvar and anal areas will be done, looking for the signs and symptoms of lichen sclerosus. Biopsy - To confirm a suspected diagnosis of lichen sclerosus, a biopsy is recommended. A small piece of the affected skin will be removed and sent to a pathologist to be examined with a microscope. Excluding other conditions - Tests may be done to exclude other conditions that could cause symptoms similar to those of lichen sclerosus, such as: Lichen planus (a skin disease that can also cause itching and fusing of genital skin). Lichen planus can occur together with lichen sclerosus.  Low estrogen level (a lack of the hormone estrogen can rarely cause fusing of genital skin but is often the cause of painful intercourse). (See "Patient information: Vaginal dryness (Beyond the Basics)".)  Vitiligo (a disorder that can cause white skin patches similar to those of lichen sclerosus). Vitiligo can occur together with lichen sclerosus.  Pemphigoid (a blistering skin disorder that also causes scarring of the vulva) is extremely rare.  Hemorrhoids (which can also cause cracks in the skin of the anus) LS and cancer - Women with LS affecting the vulva are at increased risk for developing squamous cell skin cancer. However, it is not clear if women who are treated for LS are at the same risk as women who are not treated. Diagnosing LS early, treating it effectively, and biopsying any  abnormal areas may help to reduce the risk of developing or missing a diagnosis of skin cancer. A once yearly examination of the skin of the vulva is recommended, and women should examine themselves regularly for lumps or sores that do not heal. A biopsy should be performed if there are areas that do not improve with treatment. LS lesions outside the genital area do not have an increased risk of cancer. Men with LS that affects the skin of the penis have an increased risk of squamous cell skin cancer of the penis. LS and painful sexual intercourse - Lichen sclerosus can lead to constriction of the vaginal opening and pain during sexual intercourse. Women who experience pain during sex first require treatment to suppress any active disease. Once the disease is controlled, some clinicians may recommend an estrogen cream to help to soften the skin around the vaginal opening. Devices called vaginal dilators, which patients can use at home, also may be used to slowly stretch the skin.  Pain with intercourse can also occur from other causes. Patients who notice pain during intercourse should discuss their symptoms with their healthcare providers. LICHEN SCLEROSUS TREATMENT - The goals of treatment of lichen sclerosus (LS) are to relieve bothersome symptoms and to prevent the condition from worsening. A  clinician may recommend medication for the physical symptoms, and may refer the patient for support and therapy for other issues associated with the condition, such as problems with sex. All patients with LS, even those without noticeable symptoms, need to use medication on a regular and ongoing basis. Patients also should see a healthcare provider for reevaluation of the disease at least once or twice yearly.  Patients who are diagnosed with LS should talk to their clinician about: The lifelong and potentially progressive nature of LS; appropriate treatment can stop the condition from worsening  Ways to manage the  condition  The slightly increased risk of vulvar cancer and the need for ongoing monitoring  How to keep the genital area healthy and avoid scratching (table 1)  Persistent pain with intercourse Depending on the severity of the condition, a healthcare provider may recommend one or more of the following treatments: Steroid ointments are recommended to reduce inflammation and itching. Steroid creams are not recommended because they contain ingredients that irritate the skin.  Steroid injections, especially if steroid ointments are not effective  Oral or topical tricyclic antidepressants (TCAs) are sometimes recommended for vulvar pain that persists despite steroids. The dose of TCAs is typically much lower than that used for treating depression. It is believed that these drugs reduce pain perception when used in low doses, although it is not clear how TCAs work. TCAs commonly used for pain management include amitriptyline, desipramine, and nortriptyline. Patients beginning TCAs commonly experience fatigue; this is not always an undesirable side effect since it can help improve sleep when TCAs are taken in the evening. TCAs are generally started in low doses and increased gradually. Their full effect may not be seen for weeks to months. Although it is not approved by the [place] Food and Drug Administration (FDA) for this use, an oral medication called acitretin has also been used for the treatment of LS in some patients [3,4]. Because it has many side effects, including a risk for liver damage, the drug is used primarily in patients who have not been helped by other treatments. Acitretin can cause severe birth defects, and women should not get pregnant during treatment or for three years after taking the drug. For this reason, acitretin usually is not recommended for women of child-bearing age. Surgery is not routinely used to treat women with LS because lichen sclerosus tends to recur after the skin heals.  However, patients whose genital tissues have grown together may have surgery to separate the fused tissues. Recurrence of the scarring occurs frequently.

## 2012-05-18 ENCOUNTER — Encounter: Payer: Self-pay | Admitting: Gynecology

## 2012-05-18 NOTE — Progress Notes (Unsigned)
Patient ID: [name], female   DOB: 11-Dec-1940, 71 y.o.   MRN: [id number]  Patient will be notified today that her right labia majora biopsy was benign. 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  Syringoma - Syringomas are benign neoplasms of the eccrine sweat glands. They commonly involve the lower eyelid and upper malar areas, occurring less frequently in the upper lip, axillae, extremities, buttocks, chest, abdomen, or thigh. Involvement of the vulva is rare (picture 11) [9-11]. Vulvar syringomas typically present as multiple, 1 to 2 mm, flesh-colored to yellow papules arranged symmetrically on the labia majora, but solitary lesions have been reported. Simultaneous involvement of the periorbital skin, cheeks, or neck is common. Most lesions are asymptomatic, but in contrast to lesions elsewhere, heat and sweating, menstruation, or pregnancy may provoke pruritus in vulvar syringomas. Excision or ablation is curative. In some patients, topical atropine or tretinoin has provided symptomatic relief [12,13].

## 2012-05-21 ENCOUNTER — Other Ambulatory Visit: Payer: Self-pay | Admitting: Anesthesiology

## 2012-05-21 ENCOUNTER — Other Ambulatory Visit: Payer: Self-pay | Admitting: Gynecology

## 2012-05-21 DIAGNOSIS — Z1211 Encounter for screening for malignant neoplasm of colon: Secondary | ICD-10-CM

## 2012-05-31 ENCOUNTER — Encounter: Payer: Self-pay | Admitting: *Deleted

## 2012-06-20 ENCOUNTER — Encounter: Payer: Self-pay | Admitting: Internal Medicine

## 2012-06-20 ENCOUNTER — Ambulatory Visit (INDEPENDENT_AMBULATORY_CARE_PROVIDER_SITE_OTHER): Payer: Medicare Other | Admitting: Internal Medicine

## 2012-06-20 VITALS — BP 128/80 | HR 68 | Ht 64.0 in | Wt 203.6 lb

## 2012-06-20 DIAGNOSIS — K648 Other hemorrhoids: Secondary | ICD-10-CM

## 2012-06-20 MED ORDER — HYDROCORTISONE ACETATE 25 MG RE SUPP: 25.0000 mg | Freq: Every day | RECTAL | Status: DC

## 2012-06-20 NOTE — Patient Instructions (Addendum)
We have sent the following medications to your pharmacy for you to pick up at your convenience: Anusol CC: Dr Posey Rea

## 2012-06-20 NOTE — Progress Notes (Signed)
[name] 1940/12/18 MRN [id number]        History of Present Illness:  This is a 71 year old white female with rectal fullness and the hemorrhoidal flareup. She had a protrusion through the rectum for about 2 weeks which she treated with the preparation H. with good response. She had acolonoscopy in 2001 and in November 2008 with findings of moderately severe diverticulosis. Her hemoglobin is normal at 14.1 and Hemoccults were negative. She had a vaginal hysterectomy. Recent gynecological exam by Dr.Fernandez did not find any sign of cystocele.    Past Medical History  Diagnosis Date  . Anxiety   . Hyperlipemia   . LBP (low back pain)   . Osteoarthritis   . Vitamin D deficiency   . Vitamin B deficiency   . Degenerative disk disease   . Diverticulosis    Past Surgical History  Procedure Date  . 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 Cervical fusion 1999 & 2009    C3-4 Elsner  . Breast surgery 2000    breast reduction  . Abdominal hysterectomy     SUPRACERVICAL HYSTERECTOMY  . Cataract surgery     left  . Joint replacement     reports that she quit smoking about 23 years ago. She has never used smokeless tobacco. She reports that she does not drink alcohol or use illicit drugs. family history includes Arthritis (age of onset:74) in her mother; Coronary artery disease in an unspecified family member; Heart disease in her father; Hypertension in her brother, father, and unspecified family member; and Polymyositis in an unspecified family member. Allergies  Allergen Reactions  . Cymbalta (Duloxetine Hcl)     diarrhea  . Ezetimibe-Simvastatin   . Phentermine Hcl         Review of Systems:Denies abdominal pain dysphagia indigestion  The remainder of the 10 point ROS is negative except as outlined in H&P   Physical Exam: General appearance  Well developed, in no distress.Overweight  Eyes- non icteric. HEENT nontraumatic, normocephalic. Mouth  no lesions, tongue papillated, no cheilosis. Neck supple without adenopathy, thyroid not enlarged, no carotid bruits, no JVD. Lungs Clear to auscultation bilaterally. Cor normal S1, normal S2, regular rhythm, no murmur,  quiet precordium. Abdomen: Soft with minimal tenderness left lower quadrant. Normoactive bowel sounds. No distention. Right upper quadrant normal  Rectal:And anoscopic exam reveals saw several external skin tags at the anal orifice. Slightly decreased rectal sphincter tone. Minimal prolapse of the rectal tissue into the anal canal with straining. Small first grade hemorrhoids internally without evidence of bleeding or thrombosis. Stool is Hemoccult negative  Extremities no pedal edema. Skin no lesions. Neurological alert and oriented x 3. Psychological normal mood and affect.  Assessment and Plan:  Rectal symptoms of  internal hemorrhoids and possible rectocele. Today's exam reveals some degree of rectocele or at least  mild descent of the rectal tissue. There is no bleeding. I have suggested conservative treatment with Anusol-HC suppositories for 12 days and reexamination in couple of months. We have discussed possibility of surgical repair which I don't think it is necessary at this point. She will continue high fiber diet   06/20/2012 Lina Sar

## 2012-06-22 ENCOUNTER — Encounter: Payer: Self-pay | Admitting: Internal Medicine

## 2012-06-22 ENCOUNTER — Ambulatory Visit (INDEPENDENT_AMBULATORY_CARE_PROVIDER_SITE_OTHER): Payer: Medicare Other | Admitting: Internal Medicine

## 2012-06-22 VITALS — BP 118/76 | HR 88 | Temp 97.9°F | Resp 16 | Wt 203.0 lb

## 2012-06-22 DIAGNOSIS — E785 Hyperlipidemia, unspecified: Secondary | ICD-10-CM

## 2012-06-22 DIAGNOSIS — M1990 Unspecified osteoarthritis, unspecified site: Secondary | ICD-10-CM

## 2012-06-22 DIAGNOSIS — M545 Low back pain, unspecified: Secondary | ICD-10-CM

## 2012-06-22 DIAGNOSIS — D518 Other vitamin B12 deficiency anemias: Secondary | ICD-10-CM

## 2012-06-22 MED ORDER — HYDROCODONE-ACETAMINOPHEN 5-325 MG PO TABS: 1.0000 | ORAL_TABLET | Freq: Two times a day (BID) | ORAL | Status: DC | PRN

## 2012-06-22 NOTE — Progress Notes (Signed)
Patient ID: [name], female   DOB: 1941-05-03, 71 y.o.   MRN: [id number]   Subjective:    Patient ID: [name], female    DOB: 03-Mar-1941, 71 y.o.   MRN: [id number]  HPI  F/u R low back pain x 2 wks after lifting a cat litter bag; she is seeing a chiropractor --moderate 3-4/10. C/o occ problems w/recalling words...  The patient is here to follow up on chronic B12 def , anxiety, headaches and chronic moderate OA/LBP/hip pain symptoms controlled with medicines. She gets no exercise.  Wt Readings from Last 3 Encounters:  06/22/12 203 lb (92.08 kg)  06/20/12 203 lb 9.6 oz (92.352 kg)  05/02/12 202 lb (91.627 kg)   BP Readings from Last 3 Encounters:  06/22/12 118/76  06/20/12 128/80  05/16/12 140/86      Review of Systems  Constitutional: Positive for fatigue. Negative for fever, chills, diaphoresis, activity change, appetite change and unexpected weight change.  HENT: Negative for hearing loss, ear pain, nosebleeds, congestion, sore throat, facial swelling, rhinorrhea, sneezing, mouth sores, trouble swallowing, neck pain, neck stiffness, postnasal drip, sinus pressure and tinnitus.   Eyes: Negative for pain, discharge, redness, itching and visual disturbance.  Respiratory: Negative for cough, chest tightness, shortness of breath, wheezing and stridor.   Cardiovascular: Negative for chest pain, palpitations and leg swelling.  Gastrointestinal: Negative for nausea, diarrhea, constipation, blood in stool, abdominal distention, anal bleeding and rectal pain.  Genitourinary: Negative for dysuria, urgency, frequency, hematuria, flank pain, vaginal bleeding, vaginal discharge, difficulty urinating, genital sores and pelvic pain.  Musculoskeletal: Positive for back pain, arthralgias and gait problem. Negative for joint swelling.  Skin: Negative.  Negative for rash.  Neurological: Negative for dizziness, tremors, seizures, syncope, speech difficulty, weakness, numbness and  headaches.  Hematological: Negative for adenopathy. Does not bruise/bleed easily.  Psychiatric/Behavioral: Positive for sleep disturbance. Negative for suicidal ideas, behavioral problems, dysphoric mood and decreased concentration. The patient is nervous/anxious.        Objective:   Physical Exam  Constitutional: She appears well-developed. No distress.       Obese  HENT:  Head: Normocephalic.  Right Ear: External ear normal.  Left Ear: External ear normal.  Nose: Nose normal.  Mouth/Throat: Oropharynx is clear and moist.  Eyes: Conjunctivae normal are normal. Pupils are equal, round, and reactive to light. Right eye exhibits no discharge. Left eye exhibits no discharge.  Neck: Normal range of motion. Neck supple. No JVD present. No tracheal deviation present. No thyromegaly present.  Cardiovascular: Normal rate, regular rhythm and normal heart sounds.   Pulmonary/Chest: No stridor. No respiratory distress. She has no wheezes.  Abdominal: Soft. Bowel sounds are normal. She exhibits no distension and no mass. There is no tenderness. There is no rebound and no guarding.  Musculoskeletal: She exhibits no edema and no tenderness (LS is tender).  Lymphadenopathy:    She has no cervical adenopathy.  Neurological: She displays normal reflexes. No cranial nerve deficit. She exhibits normal muscle tone. Coordination normal.  Skin: No rash noted. No erythema.  Psychiatric: Her behavior is normal. Judgment and thought content normal.   Lab Results  Component Value Date   WBC 6.6 06/17/2011   HGB 14.1 06/17/2011   HCT 41.6 06/17/2011   PLT 279.0 06/17/2011   GLUCOSE 104* 06/17/2011   CHOL 211* 06/17/2011   TRIG 96.0 06/17/2011   HDL 58.10 06/17/2011   LDLDIRECT 161.3 06/17/2011   ALT 21 06/17/2011   AST 20 06/17/2011  NA 140 06/17/2011   K 4.1 06/17/2011   CL 108 06/17/2011   CREATININE 0.6 06/17/2011   BUN 22 06/17/2011   CO2 24 06/17/2011   TSH 2.58 06/17/2011   INR 1.76*  08/29/2009          Assessment & Plan:

## 2012-06-22 NOTE — Assessment & Plan Note (Signed)
Continue with current prescription therapy as reflected on the Med list.  

## 2012-07-16 ENCOUNTER — Ambulatory Visit: Payer: Medicare Other | Admitting: Gynecology

## 2012-10-26 ENCOUNTER — Ambulatory Visit (INDEPENDENT_AMBULATORY_CARE_PROVIDER_SITE_OTHER): Payer: Medicare Other | Admitting: Internal Medicine

## 2012-10-26 ENCOUNTER — Encounter: Payer: Self-pay | Admitting: Internal Medicine

## 2012-10-26 VITALS — BP 140/90 | HR 76 | Temp 97.1°F | Resp 16 | Wt 200.0 lb

## 2012-10-26 DIAGNOSIS — F4323 Adjustment disorder with mixed anxiety and depressed mood: Secondary | ICD-10-CM

## 2012-10-26 DIAGNOSIS — E559 Vitamin D deficiency, unspecified: Secondary | ICD-10-CM

## 2012-10-26 DIAGNOSIS — D518 Other vitamin B12 deficiency anemias: Secondary | ICD-10-CM

## 2012-10-26 DIAGNOSIS — M545 Low back pain, unspecified: Secondary | ICD-10-CM

## 2012-10-26 DIAGNOSIS — E785 Hyperlipidemia, unspecified: Secondary | ICD-10-CM

## 2012-10-26 MED ORDER — BUPROPION HCL ER (SR) 100 MG PO TB12: 100.0000 mg | ORAL_TABLET | Freq: Two times a day (BID) | ORAL | Status: DC

## 2012-10-26 NOTE — Progress Notes (Signed)
Subjective:    HPI  F/u R low back pain x 2 wks after lifting a cat litter bag; she is seeing a chiropractor --moderate 3-4/10. C/o occ problems w/recalling words...  The patient is here to follow up on chronic B12 def , anxiety, headaches and chronic moderate OA/LBP/hip pain symptoms controlled with medicines. She gets no exercise.   Wt Readings from Last 3 Encounters:  10/26/12 200 lb (90.719 kg)  06/22/12 203 lb (92.08 kg)  06/20/12 203 lb 9.6 oz (92.352 kg)   BP Readings from Last 3 Encounters:  10/26/12 140/90  06/22/12 118/76  06/20/12 128/80      Review of Systems  Constitutional: Positive for fatigue. Negative for fever, chills, diaphoresis, activity change, appetite change and unexpected weight change.  HENT: Negative for hearing loss, ear pain, nosebleeds, congestion, sore throat, facial swelling, rhinorrhea, sneezing, mouth sores, trouble swallowing, neck pain, neck stiffness, postnasal drip, sinus pressure and tinnitus.   Eyes: Negative for pain, discharge, redness, itching and visual disturbance.  Respiratory: Negative for cough, chest tightness, shortness of breath, wheezing and stridor.   Cardiovascular: Negative for chest pain, palpitations and leg swelling.  Gastrointestinal: Negative for nausea, diarrhea, constipation, blood in stool, abdominal distention, anal bleeding and rectal pain.  Genitourinary: Negative for dysuria, urgency, frequency, hematuria, flank pain, vaginal bleeding, vaginal discharge, difficulty urinating, genital sores and pelvic pain.  Musculoskeletal: Positive for back pain, arthralgias and gait problem. Negative for joint swelling.  Skin: Negative.  Negative for rash.  Neurological: Negative for dizziness, tremors, seizures, syncope, speech difficulty, weakness, numbness and headaches.  Hematological: Negative for adenopathy. Does not bruise/bleed easily.  Psychiatric/Behavioral: Positive for sleep disturbance. Negative for suicidal  ideas, behavioral problems, dysphoric mood and decreased concentration. The patient is nervous/anxious.        Objective:   Physical Exam  Constitutional: She appears well-developed. No distress.  Obese  HENT:  Head: Normocephalic.  Right Ear: External ear normal.  Left Ear: External ear normal.  Nose: Nose normal.  Mouth/Throat: Oropharynx is clear and moist.  Eyes: Conjunctivae are normal. Pupils are equal, round, and reactive to light. Right eye exhibits no discharge. Left eye exhibits no discharge.  Neck: Normal range of motion. Neck supple. No JVD present. No tracheal deviation present. No thyromegaly present.  Cardiovascular: Normal rate, regular rhythm and normal heart sounds.   Pulmonary/Chest: No stridor. No respiratory distress. She has no wheezes.  Abdominal: Soft. Bowel sounds are normal. She exhibits no distension and no mass. There is no tenderness. There is no rebound and no guarding.  Musculoskeletal: She exhibits no edema and no tenderness (LS is tender).  Lymphadenopathy:    She has no cervical adenopathy.  Neurological: She displays normal reflexes. No cranial nerve deficit. She exhibits normal muscle tone. Coordination normal.  Skin: No rash noted. No erythema.  Psychiatric: Her behavior is normal. Judgment and thought content normal.   Lab Results  Component Value Date   WBC 6.6 06/17/2011   HGB 14.1 06/17/2011   HCT 41.6 06/17/2011   PLT 279.0 06/17/2011   GLUCOSE 104* 06/17/2011   CHOL 211* 06/17/2011   TRIG 96.0 06/17/2011   HDL 58.10 06/17/2011   LDLDIRECT 161.3 06/17/2011   ALT 21 06/17/2011   AST 20 06/17/2011   NA 140 06/17/2011   K 4.1 06/17/2011   CL 108 06/17/2011   CREATININE 0.6 06/17/2011   BUN 22 06/17/2011   CO2 24 06/17/2011   TSH 2.58 06/17/2011   INR 1.76* 08/29/2009  Assessment & Plan:

## 2012-10-28 DIAGNOSIS — F4323 Adjustment disorder with mixed anxiety and depressed mood: Secondary | ICD-10-CM | POA: Insufficient documentation

## 2012-10-28 NOTE — Assessment & Plan Note (Signed)
Continue with current prescription therapy as reflected on the Med list.  

## 2012-10-28 NOTE — Assessment & Plan Note (Signed)
A trial of low dose Wellbutrin SR

## 2012-12-01 ENCOUNTER — Encounter: Payer: Self-pay | Admitting: Internal Medicine

## 2012-12-03 ENCOUNTER — Telehealth: Payer: Self-pay | Admitting: *Deleted

## 2012-12-03 ENCOUNTER — Other Ambulatory Visit: Payer: Self-pay | Admitting: Internal Medicine

## 2012-12-03 NOTE — Telephone Encounter (Signed)
I was prescribed this medicine on my last visit, but had some side effects and stopped taking it after one week. buPROPion 100 MG 12 hr tablet Commonly known as: WELLBUTRIN SR I felt it was making me moody; easily angered, etc. I do better with 5 mg Diazapam and do not take it unless absolutely necessary. I need a refill on the prescription.

## 2012-12-04 MED ORDER — DIAZEPAM 5 MG PO TABS: 5.0000 mg | ORAL_TABLET | Freq: Two times a day (BID) | ORAL | Status: DC | PRN

## 2012-12-04 NOTE — Telephone Encounter (Signed)
OK to ref Diazepam x3 Thx

## 2012-12-04 NOTE — Telephone Encounter (Signed)
RX faxed to pharmacy.

## 2013-01-25 ENCOUNTER — Other Ambulatory Visit: Payer: Self-pay | Admitting: Neurological Surgery

## 2013-01-25 DIAGNOSIS — M47812 Spondylosis without myelopathy or radiculopathy, cervical region: Secondary | ICD-10-CM

## 2013-01-30 ENCOUNTER — Encounter: Payer: Self-pay | Admitting: Internal Medicine

## 2013-01-30 ENCOUNTER — Ambulatory Visit (INDEPENDENT_AMBULATORY_CARE_PROVIDER_SITE_OTHER): Payer: Medicare Other | Admitting: Internal Medicine

## 2013-01-30 VITALS — BP 140/80 | HR 72 | Temp 98.0°F | Resp 16 | Wt 204.0 lb

## 2013-01-30 DIAGNOSIS — D518 Other vitamin B12 deficiency anemias: Secondary | ICD-10-CM

## 2013-01-30 DIAGNOSIS — F411 Generalized anxiety disorder: Secondary | ICD-10-CM

## 2013-01-30 DIAGNOSIS — M542 Cervicalgia: Secondary | ICD-10-CM

## 2013-01-30 MED ORDER — ZOLPIDEM TARTRATE 10 MG PO TABS: 5.0000 mg | ORAL_TABLET | Freq: Every evening | ORAL | Status: DC | PRN

## 2013-01-30 NOTE — Progress Notes (Signed)
Subjective:    HPI  F/u R low back pain - she is seeing a chiropractor --moderate 3-4/10. C/o occ problems w/recalling words... C/o L neck pain - Dr Danielle Dess; neck MRI next week  The patient is here to follow up on chronic B12 def , anxiety, headaches and chronic moderate OA/LBP/hip pain symptoms controlled with medicines. She gets no exercise.   Wt Readings from Last 3 Encounters:  01/30/13 204 lb (92.534 kg)  10/26/12 200 lb (90.719 kg)  06/22/12 203 lb (92.08 kg)   BP Readings from Last 3 Encounters:  01/30/13 140/80  10/26/12 140/90  06/22/12 118/76      Review of Systems  Constitutional: Positive for fatigue. Negative for fever, chills, diaphoresis, activity change, appetite change and unexpected weight change.  HENT: Negative for hearing loss, ear pain, nosebleeds, congestion, sore throat, facial swelling, rhinorrhea, sneezing, mouth sores, trouble swallowing, neck pain, neck stiffness, postnasal drip, sinus pressure and tinnitus.   Eyes: Negative for pain, discharge, redness, itching and visual disturbance.  Respiratory: Negative for cough, chest tightness, shortness of breath, wheezing and stridor.   Cardiovascular: Negative for chest pain, palpitations and leg swelling.  Gastrointestinal: Negative for nausea, diarrhea, constipation, blood in stool, abdominal distention, anal bleeding and rectal pain.  Genitourinary: Negative for dysuria, urgency, frequency, hematuria, flank pain, vaginal bleeding, vaginal discharge, difficulty urinating, genital sores and pelvic pain.  Musculoskeletal: Positive for back pain, arthralgias and gait problem. Negative for joint swelling.  Skin: Negative.  Negative for rash.  Neurological: Negative for dizziness, tremors, seizures, syncope, speech difficulty, weakness, numbness and headaches.  Hematological: Negative for adenopathy. Does not bruise/bleed easily.  Psychiatric/Behavioral: Positive for sleep disturbance. Negative for suicidal  ideas, behavioral problems, dysphoric mood and decreased concentration. The patient is nervous/anxious.        Objective:   Physical Exam  Constitutional: She appears well-developed. No distress.  Obese  HENT:  Head: Normocephalic.  Right Ear: External ear normal.  Left Ear: External ear normal.  Nose: Nose normal.  Mouth/Throat: Oropharynx is clear and moist.  Eyes: Conjunctivae are normal. Pupils are equal, round, and reactive to light. Right eye exhibits no discharge. Left eye exhibits no discharge.  Neck: Normal range of motion. Neck supple. No JVD present. No tracheal deviation present. No thyromegaly present.  Cardiovascular: Normal rate, regular rhythm and normal heart sounds.   Pulmonary/Chest: No stridor. No respiratory distress. She has no wheezes.  Abdominal: Soft. Bowel sounds are normal. She exhibits no distension and no mass. There is no tenderness. There is no rebound and no guarding.  Musculoskeletal: She exhibits no edema and no tenderness (LS is tender).  Lymphadenopathy:    She has no cervical adenopathy.  Neurological: She displays normal reflexes. No cranial nerve deficit. She exhibits normal muscle tone. Coordination normal.  Skin: No rash noted. No erythema.  Psychiatric: Her behavior is normal. Judgment and thought content normal.   Lab Results  Component Value Date   WBC 6.6 06/17/2011   HGB 14.1 06/17/2011   HCT 41.6 06/17/2011   PLT 279.0 06/17/2011   GLUCOSE 104* 06/17/2011   CHOL 211* 06/17/2011   TRIG 96.0 06/17/2011   HDL 58.10 06/17/2011   LDLDIRECT 161.3 06/17/2011   ALT 21 06/17/2011   AST 20 06/17/2011   NA 140 06/17/2011   K 4.1 06/17/2011   CL 108 06/17/2011   CREATININE 0.6 06/17/2011   BUN 22 06/17/2011   CO2 24 06/17/2011   TSH 2.58 06/17/2011   INR 1.76* 08/29/2009  Assessment & Plan:

## 2013-01-30 NOTE — Assessment & Plan Note (Signed)
6/14 L -- Dr Danielle Dess MRI next wk

## 2013-01-30 NOTE — Assessment & Plan Note (Signed)
Continue with current prescription therapy as reflected on the Med list.  

## 2013-02-02 ENCOUNTER — Ambulatory Visit
Admission: RE | Admit: 2013-02-02 | Discharge: 2013-02-02 | Disposition: A | Discharge status: Home / Self Care | Payer: Medicare Other | Source: Ambulatory Visit | Attending: Neurological Surgery | Admitting: Neurological Surgery

## 2013-02-02 DIAGNOSIS — M47812 Spondylosis without myelopathy or radiculopathy, cervical region: Secondary | ICD-10-CM

## 2013-03-20 DIAGNOSIS — H2510 Age-related nuclear cataract, unspecified eye: Secondary | ICD-10-CM | POA: Insufficient documentation

## 2013-03-20 DIAGNOSIS — Z961 Presence of intraocular lens: Secondary | ICD-10-CM | POA: Insufficient documentation

## 2013-05-06 ENCOUNTER — Encounter: Payer: Self-pay | Admitting: Gynecology

## 2013-05-09 ENCOUNTER — Other Ambulatory Visit: Payer: Self-pay | Admitting: *Deleted

## 2013-05-09 ENCOUNTER — Other Ambulatory Visit: Payer: Self-pay | Admitting: Radiology

## 2013-05-09 DIAGNOSIS — R922 Inconclusive mammogram: Secondary | ICD-10-CM

## 2013-05-10 ENCOUNTER — Encounter: Payer: Self-pay | Admitting: Gynecology

## 2013-05-10 ENCOUNTER — Ambulatory Visit (INDEPENDENT_AMBULATORY_CARE_PROVIDER_SITE_OTHER): Payer: Medicare Other | Admitting: Gynecology

## 2013-05-10 VITALS — BP 130/70 | Ht 63.75 in | Wt 200.0 lb

## 2013-05-10 DIAGNOSIS — N898 Other specified noninflammatory disorders of vagina: Secondary | ICD-10-CM

## 2013-05-10 DIAGNOSIS — Z8639 Personal history of other endocrine, nutritional and metabolic disease: Secondary | ICD-10-CM

## 2013-05-10 DIAGNOSIS — L293 Anogenital pruritus, unspecified: Secondary | ICD-10-CM

## 2013-05-10 DIAGNOSIS — N63 Unspecified lump in unspecified breast: Secondary | ICD-10-CM

## 2013-05-10 DIAGNOSIS — N952 Postmenopausal atrophic vaginitis: Secondary | ICD-10-CM

## 2013-05-10 DIAGNOSIS — N6320 Unspecified lump in the left breast, unspecified quadrant: Secondary | ICD-10-CM

## 2013-05-10 DIAGNOSIS — M899 Disorder of bone, unspecified: Secondary | ICD-10-CM

## 2013-05-10 DIAGNOSIS — M8580 Other specified disorders of bone density and structure, unspecified site: Secondary | ICD-10-CM

## 2013-05-10 MED ORDER — CLOBETASOL PROPIONATE 0.05 % EX CREA: TOPICAL_CREAM | CUTANEOUS | Status: DC

## 2013-05-10 NOTE — Patient Instructions (Signed)
Bone Densitometry Bone densitometry is a special X-ray that measures your bone density and can be used to help predict your risk of bone fractures. This test is used to determine bone mineral content and density to diagnose osteoporosis. Osteoporosis is the loss of bone that may cause the bone to become weak. Osteoporosis commonly occurs in women entering menopause. However, it may be found in men and in people with other diseases. PREPARATION FOR TEST No preparation necessary. WHO SHOULD BE TESTED?  All women older than 65.  Postmenopausal women (50 to 65) with risk factors for osteoporosis.  People with a previous fracture caused by normal activities.  People with a small body frame (less than 127 poundsor a body mass index [BMI] of less than 21).  People who have a parent with a hip fracture or history of osteoporosis.  People who smoke.  People who have rheumatoid arthritis.  Anyone who engages in excessive alcohol use (more than 3 drinks most days).  Women who experience early menopause. WHEN SHOULD YOU BE RETESTED? Current guidelines suggest that you should wait at least 2 years before doing a bone density test again if your first test was normal.Recent studies indicated that women with normal bone density may be able to wait a few years before needing to repeat a bone density test. You should discuss this with your caregiver.  NORMAL FINDINGS   Normal: less than standard deviation below normal (greater than -1).  Osteopenia: 1 to 2.5 standard deviations below normal (-1 to -2.5).  Osteoporosis: greater than 2.5 standard deviations below normal (less than -2.5). Test results are reported as a "T score" and a "Z score."The T score is a number that compares your bone density with the bone density of healthy, young women.The Z score is a number that compares your bone density with the scores of women who are the same age, gender, and race.  Ranges for normal findings may vary  among different laboratories and hospitals. You should always check with your doctor after having lab work or other tests done to discuss the meaning of your test results and whether your values are considered within normal limits. MEANING OF TEST  Your caregiver will go over the test results with you and discuss the importance and meaning of your results, as well as treatment options and the need for additional tests if necessary. OBTAINING THE TEST RESULTS It is your responsibility to obtain your test results. Ask the lab or department performing the test when and how you will get your results. Document Released: 08/09/2004 Document Revised: 10/10/2011 Document Reviewed: 09/01/2010 [employer] Patient Information 2014 [employer].  

## 2013-05-10 NOTE — Progress Notes (Signed)
[name] 11-17-1940 [id number]   History:    72 y.o.  for followup and gynecological exam. 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 She also had been using estradiol and 0.02% twice a week for vaginal atrophy. 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 Patient is already scheduled to see the general surgeon Dr.Streck for consultation.  She is being followed by Dr. Posey Rea who has done her lab work. .Review record indicated also that she had a normal colonoscopy in 2008. She had a breast reduction in the year 2000. 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 She has had a cervical discectomy C2-C3, C3-C4 in 1995 and 2009 respectively.  She in frequently does her monthly self breast exam. She sees Dr. [place] for yearly mole checks. Patient's last bone density study was done in our office in 2012 in the distal one third forearm was utilized for analysis because of the patient's history of bilateral hip replacement. Patient with history of supracervical hysterectomy October 2005 for adenomyosis and benign fibroids. Last colonoscopy was in 2008. Patient with prior history of colon polyps. She has been followed by Dr. Loreta Ave [facility]. Review of patient's last bone density study in 2012 demonstrated her distal one third forearm had a T score of -1.8 in the osteopenic range. Patient has had history of vitamin D deficiency in the past that her PCP has been monitoring as well. She is currently taking 1000 units of vitamin D.   Past medical history,surgical history, family history and social history were all reviewed and documented in the EPIC chart.  Gynecologic History No LMP recorded. Patient has had a hysterectomy. Contraception: status post  hysterectomy Last Pap: 2012. Results were: normal Last mammogram: see above. Results were: see above  Obstetric History OB History  Gravida Para Term Preterm AB SAB TAB Ectopic Multiple Living  2 1   1  1   1     # Outcome Date GA Lbr Len/2nd Weight Sex Delivery Anes PTL Lv  2 TAB           1 PAR     M SVD          ROS: A ROS was performed and pertinent positives and negatives are included in the history.  GENERAL: No fevers or chills. HEENT: No change in vision, no earache, sore throat or sinus congestion. NECK: No pain or stiffness. CARDIOVASCULAR: No chest pain or pressure. No palpitations. PULMONARY: No shortness of breath, cough or wheeze. GASTROINTESTINAL: No abdominal pain, nausea, vomiting or diarrhea, melena or bright red blood per rectum. GENITOURINARY: No urinary frequency, urgency, hesitancy or dysuria. MUSCULOSKELETAL: No joint or muscle pain, no back pain, no recent trauma. DERMATOLOGIC: No rash, no itching, no lesions. ENDOCRINE: No polyuria, polydipsia, no heat or cold intolerance. No recent change in weight. HEMATOLOGICAL: No anemia or easy bruising or bleeding. NEUROLOGIC: No headache, seizures, numbness, tingling or weakness. PSYCHIATRIC: No depression, no loss of interest in normal activity or change in sleep pattern.     Exam: chaperone present  BP 130/70  Ht 5' 3.75" (1.619 m)  Wt 200 lb (90.719 kg)  BMI 34.61 kg/m2  Body mass index is 34.61 kg/(m^2).  General appearance : Well developed well nourished female. No acute distress HEENT:  Neck supple, trachea midline, no carotid bruits, no thyroidmegaly Lungs: Clear to auscultation, no rhonchi or wheezes, or rib retractions  Heart: Regular rate and rhythm, no murmurs or gallops Breast:Examined in sitting and supine position were symmetrical in appearance, no palpable masses or tenderness,  no skin retraction, no nipple inversion, no nipple discharge, no skin discoloration, no axillary or supraclavicular  lymphadenopathy Abdomen: no palpable masses or tenderness, no rebound or guarding Extremities: no edema or skin discoloration or tenderness  Pelvic:  Bartholin, Urethra, Skene Glands: atrophic changes            Vagina: No gross lesions or discharge, atrophic changes  Cervix: No gross lesions or discharge  Uterus absent  Adnexa  Without masses or tenderness  Anus and perineum  normal   Rectovaginal  normal sphincter tone without palpated masses or tenderness             Hemoccult PCP provides     Assessment/Plan:  72 y.o. female with history of gradual atrophy and highly suspicious in the past for lichen sclerosus had responded well with clobetasol. She will be instructed to apply the clobetasol cream 0.05% twice a week externally. I have instructed her to hold off on the twice a week internal vaginal estrogen until evaluation by the general surgeon on this breast mass has been completed. She will need to schedule her bone density study. She was informed to contact her gastroenterologist as to when this her next colonoscopy. We discussed the importance of calcium and vitamin D for osteoporosis prevention. Patient's immunizations are all of today.   Note: This dictation was prepared with  Dragon/digital dictation along withSmart phrase technology. Any transcriptional errors that result from this process are unintentional.   Ok Edwards MD, 5:45 PM 05/10/2013

## 2013-05-14 ENCOUNTER — Ambulatory Visit (INDEPENDENT_AMBULATORY_CARE_PROVIDER_SITE_OTHER): Payer: Medicare Other | Admitting: Surgery

## 2013-05-14 ENCOUNTER — Encounter (INDEPENDENT_AMBULATORY_CARE_PROVIDER_SITE_OTHER): Payer: Self-pay | Admitting: Surgery

## 2013-05-14 ENCOUNTER — Encounter: Payer: Self-pay | Admitting: Gynecology

## 2013-05-14 VITALS — BP 118/76 | HR 98 | Temp 97.2°F | Ht 64.0 in | Wt 199.6 lb

## 2013-05-14 DIAGNOSIS — N6320 Unspecified lump in the left breast, unspecified quadrant: Secondary | ICD-10-CM

## 2013-05-14 DIAGNOSIS — N63 Unspecified lump in unspecified breast: Secondary | ICD-10-CM

## 2013-05-14 NOTE — Patient Instructions (Signed)
We will schedule surgery to remove the small area from your left breast

## 2013-05-15 ENCOUNTER — Telehealth: Payer: Self-pay | Admitting: *Deleted

## 2013-05-15 NOTE — Telephone Encounter (Signed)
[name] I spoke with patient tonight about her upcoming biopsy. She felt she got the run around with [facility] and Dr. Weldon Inches office and she told me that they are working with her to be seen at [facility].. Please follow up on Friday to see if all has been scheduled  she is a sweet and frightened lady. Thanks JF

## 2013-05-15 NOTE — Telephone Encounter (Signed)
(  Pt aware you are out of the office) pt had her appointment with Dr.Streck regarding her left breast on 05/14/13. Pt would like to speak with you personally if possible about her OV. Her # [phone] she would like some advice from you. Please advise

## 2013-05-15 NOTE — Progress Notes (Signed)
[name] DOB: [birth year] MRN: [id number]                                                                                      DATE: 05/14/2013  PCP: Sonda Primes, MD Referring Provider: Tresa Garter, MD  IMPRESSION:  Left breast mass with [facility] and fibroadenoma on NCB  PLAN:   Have recommended excisional bx. While this is most likely benign, ALH usually should be excised to be sure there is not some invasive somponedn missed on the needle bx. I have discussed the indications for the lumpectomy and described the procedure. She understand that the chance of removal of the abnormal area is very good, but that occasionally we are unable to locate it and may have to do a second procedure. We also discussed the possibility of a second procedure to get additional tissue. Risks of surgery such as bleeding and infection have also been explained, as well as the implications of not doing the surgery. She understands and wishes to proceed.                  CC:  Chief Complaint  Patient presents with  . Breast Problem    ALH right    HPI:  [name] is a 72 y.o.  female who presents for evaluation of a left breast mass found recently on mammogram and NCB showed a fibroadenoma but some ALH also identified. Excision was recommended and she comes in for evaluation.  PMH:  has a past medical history of Anxiety; Hyperlipemia; LBP (low back pain); Osteoarthritis; Vitamin D deficiency; Vitamin B deficiency; Degenerative disk disease; and Diverticulosis.  PSH:   has past surgical history that includes Total hip arthroplasty ((L) 2007 (R) 2011); Cervical fusion (1999 & 2009); cataract surgery; Joint replacement; Breast surgery (2000); Eye surgery (Left, 2012); Spine surgery (0865&7846); and Abdominal hysterectomy (2005).  ALLERGIES:   Allergies  Allergen Reactions  . Cymbalta [Duloxetine Hcl]     diarrhea  . Ezetimibe-Simvastatin   . Phentermine Hcl     MEDICATIONS:  Current outpatient prescriptions:Biotin 1000 MCG tablet, Take 2,000 mcg by mouth 3 (three) times daily., Disp: , Rfl: ;  celecoxib (CELEBREX) 200 MG capsule, Take 1 capsule (200 mg total) by mouth daily., Disp: 90 capsule, Rfl: 3;  Cholecalciferol (VITAMIN D3) 1000 UNITS tablet, Take 2,000 Units by mouth daily.  , Disp: , Rfl: ;  clobetasol cream (TEMOVATE) 0.05 %, Apply twice a week, Disp: 30 g, Rfl: 5 diazepam (VALIUM) 5 MG tablet, Take 1 tablet (5 mg total) by mouth 2 (two) times daily as needed., Disp: 30 tablet, Rfl: 3;  vitamin B-12 (CYANOCOBALAMIN) 1000 MCG tablet, Take 1,000 mcg by mouth daily.  , Disp: , Rfl: ;  zolpidem (AMBIEN) 10 MG tablet, Take 0.5-1 tablets (5-10 mg total) by mouth at bedtime as needed., Disp: 30 tablet, Rfl: 3 amoxicillin (AMOXIL) 500 MG capsule, Take 4 capsules (2,000 mg total) by mouth daily. For dental visits., Disp: 40 capsule, Rfl: 3;  HYDROcodone-acetaminophen (NORCO/VICODIN) 5-325 MG per tablet, Take 1 tablet by mouth 2 (two) times daily as needed for pain., Disp: 60 tablet, Rfl: 1  ROS: She has filled out our 12 point review of systems and it is negative . EXAM:   VITAL SIGNS:  BP 118/76  Pulse 98  Temp(Src) 97.2 F (36.2 C) (Temporal)  Ht 5\' 4"  (1.626 m)  Wt 199 lb 9.6 oz (90.538 kg)  BMI 34.24 kg/m2  SpO2 97%  GENERAL:  The patient is alert, oriented, and generally healthy-appearing, NAD. Mood and affect are normal.  HEENT:  The head is normocephalic, the eyes nonicteric, the pupils were round regular and equal. EOMs are normal. Pharynx normal. Dentition good.  NECK:  The neck is supple and there are no masses or thyromegaly.  LUNGS: Normal respirations and clear to auscultation.  HEART: Regular rhythm, with no murmurs rubs or gallops. Pulses are intact carotid dorsalis pedis and posterior tibial. No significant varicosities are noted.  BREASTS:  Symmetric, no dominant mass  ABDOMEN: Soft, flat, and nontender. No masses or organomegaly is  noted. No hernias are noted. Bowel sounds are normal.  EXTREMITIES:  Good range of motion, no edema.   DATA REVIEWED:  Mammo and sono reports and apth report all reviewed    [name] 05/15/2013  CC: Plotnikov, Georgina Quint, MD, Sonda Primes, MD

## 2013-05-17 NOTE — Telephone Encounter (Signed)
Pt called back and has decided to continue to see Dr.Streck and have everything taking care here locally.

## 2013-05-24 ENCOUNTER — Encounter (HOSPITAL_COMMUNITY): Payer: Self-pay

## 2013-05-24 ENCOUNTER — Encounter (HOSPITAL_COMMUNITY)
Admission: RE | Admit: 2013-05-24 | Discharge: 2013-05-24 | Disposition: A | Discharge status: Home / Self Care | Payer: Medicare Other | Source: Ambulatory Visit | Attending: Surgery | Admitting: Surgery

## 2013-05-24 ENCOUNTER — Ambulatory Visit (HOSPITAL_COMMUNITY)
Admission: RE | Admit: 2013-05-24 | Discharge: 2013-05-24 | Disposition: A | Discharge status: Home / Self Care | Payer: Medicare Other | Source: Ambulatory Visit | Attending: Anesthesiology | Admitting: Anesthesiology

## 2013-05-24 DIAGNOSIS — C50919 Malignant neoplasm of unspecified site of unspecified female breast: Secondary | ICD-10-CM | POA: Insufficient documentation

## 2013-05-24 DIAGNOSIS — Z01812 Encounter for preprocedural laboratory examination: Secondary | ICD-10-CM | POA: Insufficient documentation

## 2013-05-24 DIAGNOSIS — Z01810 Encounter for preprocedural cardiovascular examination: Secondary | ICD-10-CM | POA: Insufficient documentation

## 2013-05-24 DIAGNOSIS — Z01818 Encounter for other preprocedural examination: Secondary | ICD-10-CM | POA: Insufficient documentation

## 2013-05-24 HISTORY — DX: Other specified postprocedural states: Z98.890

## 2013-05-24 HISTORY — DX: Nausea with vomiting, unspecified: R11.2

## 2013-05-24 LAB — CBC
HCT: 45.9 % (ref 36.0–46.0)
Hemoglobin: 15.4 g/dL — ABNORMAL HIGH (ref 12.0–15.0)
MCH: 32.1 pg (ref 26.0–34.0)
MCHC: 33.6 g/dL (ref 30.0–36.0)
MCV: 95.6 fL (ref 78.0–100.0)
Platelets: 294 10*3/uL (ref 150–400)
RBC: 4.8 MIL/uL (ref 3.87–5.11)
RDW: 13.1 % (ref 11.5–15.5)
WBC: 9.2 10*3/uL (ref 4.0–10.5)

## 2013-05-24 LAB — BASIC METABOLIC PANEL
BUN: 12 mg/dL (ref 6–23)
CO2: 25 mEq/L (ref 19–32)
Calcium: 9.8 mg/dL (ref 8.4–10.5)
Chloride: 101 mEq/L (ref 96–112)
Creatinine, Ser: 0.65 mg/dL (ref 0.50–1.10)
GFR calc Af Amer: >90 mL/min (ref >90)
GFR calc non Af Amer: 86 mL/min — ABNORMAL LOW (ref >90)
Glucose, Bld: 97 mg/dL (ref 70–99)
Potassium: 4.3 mEq/L (ref 3.5–5.1)
Sodium: 138 mEq/L (ref 135–145)

## 2013-05-24 MED ORDER — CHLORHEXIDINE GLUCONATE 4 % EX LIQD: 1.0000 "application " | Freq: Once | CUTANEOUS | Status: DC

## 2013-05-24 NOTE — Pre-Procedure Instructions (Signed)
DHARMA PARE  05/24/2013   Your procedure is scheduled on:  Monday November 3 rd at 0930 AM  Report to [facility] Short Stay Main Entrance "A" after coming from [place] at 0730 AM for needle localization  Call this number if you have problems the morning of surgery: [phone]   Remember:   Do not eat food or drink liquids after midnight.   Take these medicines the morning of surgery with A SIP OF WATER: Valium if needed and Hydrocodone if needed for pain    Stop all Aspirin, Herbal medication, Nsaids, 5 days prior to surgery   Do not wear jewelry, make-up or nail polish.  Do not wear lotions, powders, or perfumes. You may wear deodorant.  Do not shave 48 hours prior to surgery.   Do not bring valuables to the hospital.  [facility] is not responsible  for any belongings or valuables.               Contacts, dentures or bridgework may not be worn into surgery.  Leave suitcase in the car. After surgery it may be brought to your room.  For patients admitted to the hospital, discharge time is determined by your  treatment team.               Patients discharged the day of surgery will not be allowed to drive home.    Special Instructions: Shower using CHG 2 nights before surgery and the night before surgery.  If you shower the day of surgery use CHG.  Use special wash - you have one bottle of CHG for all showers.  You should use approximately 1/3 of the bottle for each shower.   Please read over the following fact sheets that you were given: Pain Booklet, Coughing and Deep Breathing and Surgical Site Infection Prevention

## 2013-05-30 ENCOUNTER — Encounter (HOSPITAL_COMMUNITY): Payer: Self-pay | Admitting: Pharmacy Technician

## 2013-06-02 MED ORDER — CEFAZOLIN SODIUM-DEXTROSE 2-3 GM-% IV SOLR
2.0000 g | INTRAVENOUS | Status: AC
  Administered 2013-06-03: 2 g via INTRAVENOUS
  Filled 2013-06-02: qty 50

## 2013-06-03 ENCOUNTER — Ambulatory Visit (HOSPITAL_COMMUNITY)
Admission: RE | Admit: 2013-06-03 | Discharge: 2013-06-03 | Disposition: A | Discharge status: Home / Self Care | Payer: Medicare Other | Source: Ambulatory Visit | Attending: Surgery | Admitting: Surgery

## 2013-06-03 ENCOUNTER — Encounter (HOSPITAL_COMMUNITY): Payer: Self-pay | Admitting: Anesthesiology

## 2013-06-03 ENCOUNTER — Ambulatory Visit (HOSPITAL_COMMUNITY): Payer: Medicare Other | Admitting: Anesthesiology

## 2013-06-03 ENCOUNTER — Encounter (HOSPITAL_COMMUNITY)
Admission: RE | Disposition: A | Discharge status: Home / Self Care | Payer: Self-pay | Source: Ambulatory Visit | Attending: Surgery

## 2013-06-03 ENCOUNTER — Encounter (HOSPITAL_COMMUNITY): Payer: Medicare Other | Admitting: Anesthesiology

## 2013-06-03 DIAGNOSIS — F411 Generalized anxiety disorder: Secondary | ICD-10-CM | POA: Insufficient documentation

## 2013-06-03 DIAGNOSIS — D249 Benign neoplasm of unspecified breast: Secondary | ICD-10-CM | POA: Insufficient documentation

## 2013-06-03 DIAGNOSIS — N6089 Other benign mammary dysplasias of unspecified breast: Secondary | ICD-10-CM

## 2013-06-03 DIAGNOSIS — I10 Essential (primary) hypertension: Secondary | ICD-10-CM | POA: Insufficient documentation

## 2013-06-03 DIAGNOSIS — N6320 Unspecified lump in the left breast, unspecified quadrant: Secondary | ICD-10-CM

## 2013-06-03 HISTORY — PX: BREAST BIOPSY: SHX20

## 2013-06-03 SURGERY — BREAST BIOPSY WITH NEEDLE LOCALIZATION
Anesthesia: General | Site: Breast | Laterality: Left | Wound class: Clean

## 2013-06-03 MED ORDER — CHLORHEXIDINE GLUCONATE 4 % EX LIQD: 1.0000 "application " | Freq: Once | CUTANEOUS | Status: DC

## 2013-06-03 MED ORDER — FENTANYL CITRATE 0.05 MG/ML IJ SOLN
INTRAMUSCULAR | Status: AC
  Filled 2013-06-03: qty 2

## 2013-06-03 MED ORDER — LACTATED RINGERS IV SOLN
INTRAVENOUS | Status: DC | PRN
  Administered 2013-06-03: 10:00:00 via INTRAVENOUS

## 2013-06-03 MED ORDER — BUPIVACAINE HCL (PF) 0.25 % IJ SOLN
INTRAMUSCULAR | Status: AC
  Filled 2013-06-03: qty 30

## 2013-06-03 MED ORDER — HYDROCODONE-ACETAMINOPHEN 5-325 MG PO TABS: 1.0000 | ORAL_TABLET | ORAL | Status: DC | PRN

## 2013-06-03 MED ORDER — PHENYLEPHRINE HCL 10 MG/ML IJ SOLN
INTRAMUSCULAR | Status: DC | PRN
  Administered 2013-06-03 (×2): 80 ug via INTRAVENOUS
  Administered 2013-06-03: 40 ug via INTRAVENOUS

## 2013-06-03 MED ORDER — FENTANYL CITRATE 0.05 MG/ML IJ SOLN
INTRAMUSCULAR | Status: DC | PRN
  Administered 2013-06-03 (×2): 50 ug via INTRAVENOUS

## 2013-06-03 MED ORDER — FENTANYL CITRATE 0.05 MG/ML IJ SOLN
25.0000 ug | INTRAMUSCULAR | Status: DC | PRN
  Administered 2013-06-03: 25 ug via INTRAVENOUS
  Administered 2013-06-03: 50 ug via INTRAVENOUS
  Administered 2013-06-03: 25 ug via INTRAVENOUS
  Administered 2013-06-03: 50 ug via INTRAVENOUS

## 2013-06-03 MED ORDER — BUPIVACAINE HCL (PF) 0.25 % IJ SOLN
INTRAMUSCULAR | Status: DC | PRN
  Administered 2013-06-03: 30 mL

## 2013-06-03 MED ORDER — PROPOFOL 10 MG/ML IV BOLUS
INTRAVENOUS | Status: DC | PRN
  Administered 2013-06-03: 200 mg via INTRAVENOUS

## 2013-06-03 MED ORDER — LIDOCAINE HCL (CARDIAC) 20 MG/ML IV SOLN
INTRAVENOUS | Status: DC | PRN
  Administered 2013-06-03: 60 mg via INTRAVENOUS

## 2013-06-03 MED ORDER — DEXAMETHASONE SODIUM PHOSPHATE 4 MG/ML IJ SOLN
INTRAMUSCULAR | Status: DC | PRN
  Administered 2013-06-03: 4 mg via INTRAVENOUS

## 2013-06-03 MED ORDER — 0.9 % SODIUM CHLORIDE (POUR BTL) OPTIME
TOPICAL | Status: DC | PRN
  Administered 2013-06-03: 1000 mL

## 2013-06-03 MED ORDER — MIDAZOLAM HCL 5 MG/5ML IJ SOLN
INTRAMUSCULAR | Status: DC | PRN
  Administered 2013-06-03: 2 mg via INTRAVENOUS

## 2013-06-03 MED ORDER — ONDANSETRON HCL 4 MG/2ML IJ SOLN
INTRAMUSCULAR | Status: DC | PRN
  Administered 2013-06-03: 4 mg via INTRAVENOUS

## 2013-06-03 SURGICAL SUPPLY — 50 items
ADH SKN CLS APL DERMABOND .7 (GAUZE/BANDAGES/DRESSINGS) ×1
ADH SKN CLS LQ APL DERMABOND (GAUZE/BANDAGES/DRESSINGS) ×1
APPLIER CLIP 9.375 MED OPEN (MISCELLANEOUS)
APPLIER CLIP 9.375 SM OPEN (CLIP) ×2
APR CLP MED 9.3 20 MLT OPN (MISCELLANEOUS)
APR CLP SM 9.3 20 MLT OPN (CLIP) ×1
BINDER BREAST LRG (GAUZE/BANDAGES/DRESSINGS) IMPLANT
BINDER BREAST XLRG (GAUZE/BANDAGES/DRESSINGS) ×1 IMPLANT
BLADE SURG 15 STRL LF DISP TIS (BLADE) ×1 IMPLANT
BLADE SURG 15 STRL SS (BLADE) ×2
CANISTER SUCTION 2500CC (MISCELLANEOUS) ×1 IMPLANT
CHLORAPREP W/TINT 26ML (MISCELLANEOUS) ×2 IMPLANT
CLIP APPLIE 9.375 MED OPEN (MISCELLANEOUS) IMPLANT
CLIP APPLIE 9.375 SM OPEN (CLIP) IMPLANT
CONT SPEC 4OZ CLIKSEAL STRL BL (MISCELLANEOUS) ×1 IMPLANT
COVER SURGICAL LIGHT HANDLE (MISCELLANEOUS) ×2 IMPLANT
DERMABOND ADHESIVE PROPEN (GAUZE/BANDAGES/DRESSINGS) ×1
DERMABOND ADVANCED (GAUZE/BANDAGES/DRESSINGS) ×1
DERMABOND ADVANCED .7 DNX12 (GAUZE/BANDAGES/DRESSINGS) ×1 IMPLANT
DERMABOND ADVANCED .7 DNX6 (GAUZE/BANDAGES/DRESSINGS) IMPLANT
DEVICE DUBIN SPECIMEN MAMMOGRA (MISCELLANEOUS) ×2 IMPLANT
DRAPE CHEST BREAST 15X10 FENES (DRAPES) ×2 IMPLANT
ELECT CAUTERY BLADE 6.4 (BLADE) ×2 IMPLANT
ELECT REM PT RETURN 9FT ADLT (ELECTROSURGICAL) ×2
ELECTRODE REM PT RTRN 9FT ADLT (ELECTROSURGICAL) ×1 IMPLANT
GLOVE BIOGEL PI IND STRL 7.0 (GLOVE) IMPLANT
GLOVE BIOGEL PI INDICATOR 7.0 (GLOVE) ×3
GLOVE EUDERMIC 7 POWDERFREE (GLOVE) ×4 IMPLANT
GLOVE SURG SS PI 7.0 STRL IVOR (GLOVE) ×2 IMPLANT
GOWN STRL NON-REIN LRG LVL3 (GOWN DISPOSABLE) ×3 IMPLANT
GOWN STRL REIN XL XLG (GOWN DISPOSABLE) ×2 IMPLANT
KIT BASIN OR (CUSTOM PROCEDURE TRAY) ×2 IMPLANT
KIT MARKER MARGIN INK (KITS) ×2 IMPLANT
KIT ROOM TURNOVER OR (KITS) ×2 IMPLANT
NDL HYPO 25GX1X1/2 BEV (NEEDLE) ×1 IMPLANT
NEEDLE HYPO 25GX1X1/2 BEV (NEEDLE) ×2 IMPLANT
NS IRRIG 1000ML POUR BTL (IV SOLUTION) ×2 IMPLANT
PACK SURGICAL SETUP 50X90 (CUSTOM PROCEDURE TRAY) ×2 IMPLANT
PAD ARMBOARD 7.5X6 YLW CONV (MISCELLANEOUS) ×2 IMPLANT
PENCIL BUTTON HOLSTER BLD 10FT (ELECTRODE) ×2 IMPLANT
SPONGE GAUZE 4X4 12PLY (GAUZE/BANDAGES/DRESSINGS) ×1 IMPLANT
SPONGE LAP 4X18 X RAY DECT (DISPOSABLE) ×3 IMPLANT
SUT MON AB 4-0 PC3 18 (SUTURE) ×2 IMPLANT
SUT VIC AB 3-0 SH 18 (SUTURE) ×2 IMPLANT
SYR BULB 3OZ (MISCELLANEOUS) ×2 IMPLANT
SYR CONTROL 10ML LL (SYRINGE) ×2 IMPLANT
TOWEL OR 17X24 6PK STRL BLUE (TOWEL DISPOSABLE) ×1 IMPLANT
TOWEL OR 17X26 10 PK STRL BLUE (TOWEL DISPOSABLE) ×2 IMPLANT
TUBE CONNECTING 12X1/4 (SUCTIONS) ×1 IMPLANT
YANKAUER SUCT BULB TIP NO VENT (SUCTIONS) ×1 IMPLANT

## 2013-06-03 NOTE — Addendum Note (Signed)
Addendum created 06/03/13 1153 by Quentin Ore, CRNA   Modules edited: Anesthesia Medication Administration

## 2013-06-03 NOTE — Anesthesia Procedure Notes (Addendum)
Procedure Name: LMA Insertion Date/Time: 06/03/2013 9:52 AM Performed by: Sharlene Dory E Pre-anesthesia Checklist: Patient identified, Emergency Drugs available, Suction available, Patient being monitored and Timeout performed Patient Re-evaluated:Patient Re-evaluated prior to inductionOxygen Delivery Method: Circle system utilized Preoxygenation: Pre-oxygenation with 100% oxygen Intubation Type: IV induction Ventilation: Mask ventilation without difficulty LMA Size: 4.0 Number of attempts: 1 Placement Confirmation: positive ETCO2 and breath sounds checked- equal and bilateral Tube secured with: Tape Dental Injury: Teeth and Oropharynx as per pre-operative assessment  Comments: LMA insertion per Lynelle Doctor, SRNA with MDA supervision.

## 2013-06-03 NOTE — Interval H&P Note (Signed)
History and Physical Interval Note:  06/03/2013 9:26 AM  [name]  has presented today for surgery, with the diagnosis of left breast mass  The various methods of treatment have been discussed with the patient and family. After consideration of risks, benefits and other options for treatment, the patient has consented to  Procedure(s): BREAST BIOPSY WITH NEEDLE LOCALIZATION (Right) as a surgical intervention .  The patient's history has been reviewed, patient examined, no change in status, stable for surgery.  I have reviewed the patient's chart and labs.  Questions were answered to the patient's satisfaction.   I have reviewed the wire loc films, and I marked the patients left breast as the operative side  [name] J

## 2013-06-03 NOTE — Anesthesia Preprocedure Evaluation (Addendum)
Anesthesia Evaluation  Patient identified by MRN, date of birth, ID band Patient awake    Reviewed: Allergy & Precautions, H&P , NPO status , Patient's Chart, lab work & pertinent test results  History of Anesthesia Complications (+) PONV  Airway Mallampati: II      Dental  (+) Teeth Intact   Pulmonary neg pulmonary ROS,          Cardiovascular hypertension,     Neuro/Psych  Headaches, Anxiety    GI/Hepatic negative GI ROS, Neg liver ROS,   Endo/Other  negative endocrine ROS  Renal/GU Renal disease     Musculoskeletal   Abdominal   Peds  Hematology   Anesthesia Other Findings   Reproductive/Obstetrics                          Anesthesia Physical Anesthesia Plan  ASA: II  Anesthesia Plan: General   Post-op Pain Management:    Induction: Intravenous  Airway Management Planned: LMA  Additional Equipment:   Intra-op Plan:   Post-operative Plan: Extubation in OR  Informed Consent: I have reviewed the patients History and Physical, chart, labs and discussed the procedure including the risks, benefits and alternatives for the proposed anesthesia with the patient or authorized representative who has indicated his/her understanding and acceptance.   Dental advisory given  Plan Discussed with: CRNA, Anesthesiologist and Surgeon  Anesthesia Plan Comments:        Anesthesia Quick Evaluation

## 2013-06-03 NOTE — Preoperative (Signed)
Beta Blockers   Reason not to administer Beta Blockers:Not Applicable 

## 2013-06-03 NOTE — H&P (View-Only) (Signed)
NAME: [name] DOB: 04/01/1941 MRN: [id number]                                                                                      DATE: 05/14/2013  PCP: Alex Plotnikov, MD Referring Provider: Plotnikov, Aleksei V, MD  IMPRESSION:  Left breast mass with ALH and fibroadenoma on NCB  PLAN:   Have recommended excisional bx. While this is most likely benign, ALH usually should be excised to be sure there is not some invasive somponedn missed on the needle bx. I have discussed the indications for the lumpectomy and described the procedure. She understand that the chance of removal of the abnormal area is very good, but that occasionally we are unable to locate it and may have to do a second procedure. We also discussed the possibility of a second procedure to get additional tissue. Risks of surgery such as bleeding and infection have also been explained, as well as the implications of not doing the surgery. She understands and wishes to proceed.                  CC:  Chief Complaint  Patient presents with  . Breast Problem    ALH right    HPI:  [name] is a 72 y.o.  female who presents for evaluation of a left breast mass found recently on mammogram and NCB showed a fibroadenoma but some ALH also identified. Excision was recommended and she comes in for evaluation.  PMH:  has a past medical history of Anxiety; Hyperlipemia; LBP (low back pain); Osteoarthritis; Vitamin D deficiency; Vitamin B deficiency; Degenerative disk disease; and Diverticulosis.  PSH:   has past surgical history that includes Total hip arthroplasty ((L) 2007 (R) 2011); Cervical fusion (1999 & 2009); cataract surgery; Joint replacement; Breast surgery (2000); Eye surgery (Left, 2012); Spine surgery (1999&2009); and Abdominal hysterectomy (2005).  ALLERGIES:   Allergies  Allergen Reactions  . Cymbalta [Duloxetine Hcl]     diarrhea  . Ezetimibe-Simvastatin   . Phentermine Hcl     MEDICATIONS:  Current outpatient prescriptions:Biotin 1000 MCG tablet, Take 2,000 mcg by mouth 3 (three) times daily., Disp: , Rfl: ;  celecoxib (CELEBREX) 200 MG capsule, Take 1 capsule (200 mg total) by mouth daily., Disp: 90 capsule, Rfl: 3;  Cholecalciferol (VITAMIN D3) 1000 UNITS tablet, Take 2,000 Units by mouth daily.  , Disp: , Rfl: ;  clobetasol cream (TEMOVATE) 0.05 %, Apply twice a week, Disp: 30 g, Rfl: 5 diazepam (VALIUM) 5 MG tablet, Take 1 tablet (5 mg total) by mouth 2 (two) times daily as needed., Disp: 30 tablet, Rfl: 3;  vitamin B-12 (CYANOCOBALAMIN) 1000 MCG tablet, Take 1,000 mcg by mouth daily.  , Disp: , Rfl: ;  zolpidem (AMBIEN) 10 MG tablet, Take 0.5-1 tablets (5-10 mg total) by mouth at bedtime as needed., Disp: 30 tablet, Rfl: 3 amoxicillin (AMOXIL) 500 MG capsule, Take 4 capsules (2,000 mg total) by mouth daily. For dental visits., Disp: 40 capsule, Rfl: 3;  HYDROcodone-acetaminophen (NORCO/VICODIN) 5-325 MG per tablet, Take 1 tablet by mouth 2 (two) times daily as needed for pain., Disp: 60 tablet, Rfl: 1    ROS: She has filled out our 12 point review of systems and it is negative . EXAM:   VITAL SIGNS:  BP 118/76  Pulse 98  Temp(Src) 97.2 F (36.2 C) (Temporal)  Ht 5' 4" (1.626 m)  Wt 199 lb 9.6 oz (90.538 kg)  BMI 34.24 kg/m2  SpO2 97%  GENERAL:  The patient is alert, oriented, and generally healthy-appearing, NAD. Mood and affect are normal.  HEENT:  The head is normocephalic, the eyes nonicteric, the pupils were round regular and equal. EOMs are normal. Pharynx normal. Dentition good.  NECK:  The neck is supple and there are no masses or thyromegaly.  LUNGS: Normal respirations and clear to auscultation.  HEART: Regular rhythm, with no murmurs rubs or gallops. Pulses are intact carotid dorsalis pedis and posterior tibial. No significant varicosities are noted.  BREASTS:  Symmetric, no dominant mass  ABDOMEN: Soft, flat, and nontender. No masses or organomegaly is  noted. No hernias are noted. Bowel sounds are normal.  EXTREMITIES:  Good range of motion, no edema.   DATA REVIEWED:  Mammo and sono reports and apth report all reviewed    Lavi Sheehan J 05/15/2013  CC: Plotnikov, Aleksei V, MD, Alex Plotnikov, MD        

## 2013-06-03 NOTE — Transfer of Care (Signed)
Immediate Anesthesia Transfer of Care Note  Patient: [name]  Procedure(s) Performed: Procedure(s): BREAST BIOPSY WITH NEEDLE LOCALIZATION (Left)  Patient Location: PACU  Anesthesia Type:General  Level of Consciousness: awake, alert  and oriented  Airway & Oxygen Therapy: Patient Spontanous Breathing and Patient connected to nasal cannula oxygen  Post-op Assessment: Report given to PACU RN, Post -op Vital signs reviewed and stable and Patient moving all extremities X 4  Post vital signs: Reviewed and stable  Complications: No apparent anesthesia complications

## 2013-06-03 NOTE — Op Note (Signed)
[name]  03-20-1941  [id number]  06/03/2013   Preoperative diagnosis: atypical lobular hyperplasia, left breast, upper outer quadrant  Postoperative diagnosis: the same  Procedure: wire localized excision area of atypical lobular hyperplasia, left breast, upper outer quadrant  Surgeon: Currie Paris, MD, FACS  Anesthesia: General  Clinical History and Indications: this patient presents for a guidewire localized excision of a left breast area of atypical hyperplasia found on needle core biopsy.  Description of procedure: The patient was seen in the holding area and the plans for the procedure reviewed. The left breast was marked as the operative side. The wire localizing films were reviewed.  The patient was taken to the operating room and after satisfactory general anesthesia had been obtained the left breast was prepped and draped and the timeout was performed.  The incision was made over the presumed area of the mass. The guidewire entered somewhat in the lower outer quadrant and tracks superior and somewhat medial. The radiologist had placed a "X." mark overlying the abnormality.Skin flaps were raised and using cautery the area was completely excised. Bleeders were controlled with either cautery or sutures as needed.  The specimen showed the clip in the specimen. It appeared to be near the superior medial and of the excision so I went ahead and took some extra tissue from the superior medial deep and of the lumpectomy cavity and labeled that separately for pathology.  I then irrigated and checked for hemostasis. I infiltrated 30 cc of 0.25% plain Marcaine for postop analgesia.  After achieving hemostasis, the incision was closed with 3-0 Vicryl, 4-0 Monocryl subcuticular, and Dermabond.  The patient tolerated the procedure well. There were no operative complications. All counts were correct.   EBL: minimal  Currie Paris, MD, FACS 06/03/2013 10:31  AM

## 2013-06-03 NOTE — Anesthesia Postprocedure Evaluation (Signed)
  Anesthesia Post-op Note  Patient: [name]  Procedure(s) Performed: Procedure(s): BREAST BIOPSY WITH NEEDLE LOCALIZATION (Left)  Patient Location: PACU  Anesthesia Type:General  Level of Consciousness: awake  Airway and Oxygen Therapy: Patient Spontanous Breathing  Post-op Pain: mild  Post-op Assessment: Post-op Vital signs reviewed  Post-op Vital Signs: Reviewed  Complications: No apparent anesthesia complications

## 2013-06-03 NOTE — Progress Notes (Signed)
Returning from lunch. Maria gave lunch relief.

## 2013-06-04 ENCOUNTER — Telehealth (INDEPENDENT_AMBULATORY_CARE_PROVIDER_SITE_OTHER): Payer: Self-pay | Admitting: *Deleted

## 2013-06-04 ENCOUNTER — Encounter (HOSPITAL_COMMUNITY): Payer: Self-pay | Admitting: Surgery

## 2013-06-04 ENCOUNTER — Telehealth (INDEPENDENT_AMBULATORY_CARE_PROVIDER_SITE_OTHER): Payer: Self-pay | Admitting: General Surgery

## 2013-06-04 NOTE — Telephone Encounter (Signed)
I called to check on pt postoperatively.  Pt had no complaints, she is feeling good, just a little sore.  She states she is very thankful that everyone was so nice, things went much better than she expected.  She is very appreciative of the great care she received and wants Dr. Jamey Ripa to be aware.  I informed pt of her post op appt with Dr. Jamey Ripa on 06/18/13 with an arrival time of 2:30pm.  Informed pt that if she has any questions or concerns prior to her appt to call out office.  Pt agreeable.

## 2013-06-04 NOTE — Telephone Encounter (Signed)
Patient made aware of path results. Will follow up at appt and call with any questions prior.  

## 2013-06-04 NOTE — Telephone Encounter (Signed)
See message below °

## 2013-06-04 NOTE — Telephone Encounter (Signed)
Message copied by Liliana Cline on Tue Jun 04, 2013  2:14 PM ------      Message from: Currie Paris      Created: Tue Jun 04, 2013  1:54 PM       Tell her path is benign and as expected ------

## 2013-06-05 ENCOUNTER — Ambulatory Visit: Payer: Medicare Other | Admitting: Internal Medicine

## 2013-06-12 ENCOUNTER — Encounter: Payer: Self-pay | Admitting: Gynecology

## 2013-06-14 ENCOUNTER — Encounter (INDEPENDENT_AMBULATORY_CARE_PROVIDER_SITE_OTHER): Payer: Self-pay

## 2013-06-14 DIAGNOSIS — H02839 Dermatochalasis of unspecified eye, unspecified eyelid: Secondary | ICD-10-CM | POA: Insufficient documentation

## 2013-06-17 ENCOUNTER — Encounter (INDEPENDENT_AMBULATORY_CARE_PROVIDER_SITE_OTHER): Payer: Self-pay

## 2013-06-18 ENCOUNTER — Encounter (INDEPENDENT_AMBULATORY_CARE_PROVIDER_SITE_OTHER): Payer: Self-pay | Admitting: Surgery

## 2013-06-18 ENCOUNTER — Ambulatory Visit (INDEPENDENT_AMBULATORY_CARE_PROVIDER_SITE_OTHER): Payer: Medicare Other | Admitting: Surgery

## 2013-06-18 VITALS — BP 124/80 | HR 101 | Temp 97.0°F | Resp 18 | Ht 64.0 in | Wt 199.0 lb

## 2013-06-18 DIAGNOSIS — Z09 Encounter for follow-up examination after completed treatment for conditions other than malignant neoplasm: Secondary | ICD-10-CM

## 2013-06-18 NOTE — Progress Notes (Signed)
NAME: [name]                                            DOB: Dec 01, 1940 DATE: 06/18/2013                                                   MRN: [id number]  CC:  Chief Complaint  Patient presents with  . Routine Post Op    breast    HPI: This patient comes in for post op follow-up .Sheunderwent left lumpectomy on 06/03/13. She feels that she is doing well.  PE:  VITAL SIGNS: BP 124/80  Pulse 101  Temp(Src) 97 F (36.1 C)  Resp 18  Ht 5\' 4"  (1.626 m)  Wt 199 lb (90.266 kg)  BMI 34.14 kg/m2  General: The patient appears to be healthy, NAD Incision healing nicely, no infection or other problem  DATA REVIEWED: Path: Diagnosis 1. 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 - FIBROADENOMA IS BROADLY PRESENT AT ANTERIOR MEDIAL MARGIN. 2. Breast, excision, Left - BENIGN BREAST TISSUE, SEE COMMENT. - NEGATIVE FOR ATYPIA OR MALIGNANCY. - SURGICAL MARGINS NEGATIVE FOR ATYPIA OR MALIGNANCY.  IMPRESSION: The patient is doing well S/P lumpectomy. Path showed some fibroadenoma type tissue, but ehre was no palpable mass noted in the tissue I removed at tthe eh time of surgery, and I took extra tissue from around the area where the clip was close to the edge of the main specimen.    PLAN: RTC PRN I gave the patient a copy of the pathology report and reviewed it with her

## 2013-06-18 NOTE — Patient Instructions (Signed)
Contniue annual mammograms and follow up with your primary physicians

## 2013-06-24 ENCOUNTER — Encounter: Payer: Self-pay | Admitting: Gynecology

## 2013-07-02 ENCOUNTER — Ambulatory Visit (INDEPENDENT_AMBULATORY_CARE_PROVIDER_SITE_OTHER): Payer: Medicare Other

## 2013-07-02 DIAGNOSIS — Z8639 Personal history of other endocrine, nutritional and metabolic disease: Secondary | ICD-10-CM

## 2013-07-02 DIAGNOSIS — M8580 Other specified disorders of bone density and structure, unspecified site: Secondary | ICD-10-CM

## 2013-07-02 DIAGNOSIS — M899 Disorder of bone, unspecified: Secondary | ICD-10-CM

## 2013-07-17 ENCOUNTER — Other Ambulatory Visit (INDEPENDENT_AMBULATORY_CARE_PROVIDER_SITE_OTHER): Payer: Medicare Other

## 2013-07-17 ENCOUNTER — Encounter: Payer: Self-pay | Admitting: Internal Medicine

## 2013-07-17 ENCOUNTER — Ambulatory Visit (INDEPENDENT_AMBULATORY_CARE_PROVIDER_SITE_OTHER)
Admission: RE | Admit: 2013-07-17 | Discharge: 2013-07-17 | Disposition: A | Discharge status: Home / Self Care | Payer: Medicare Other | Source: Ambulatory Visit | Attending: Internal Medicine | Admitting: Internal Medicine

## 2013-07-17 ENCOUNTER — Ambulatory Visit (INDEPENDENT_AMBULATORY_CARE_PROVIDER_SITE_OTHER): Payer: Medicare Other | Admitting: Internal Medicine

## 2013-07-17 VITALS — BP 120/78 | Temp 97.7°F | Wt 199.0 lb

## 2013-07-17 DIAGNOSIS — R079 Chest pain, unspecified: Secondary | ICD-10-CM

## 2013-07-17 DIAGNOSIS — M545 Low back pain, unspecified: Secondary | ICD-10-CM

## 2013-07-17 DIAGNOSIS — F411 Generalized anxiety disorder: Secondary | ICD-10-CM

## 2013-07-17 LAB — CBC WITH DIFFERENTIAL/PLATELET
Basophils Absolute: 0.2 10*3/uL — ABNORMAL HIGH (ref 0.0–0.1)
Basophils Relative: 1.9 % (ref 0.0–3.0)
Eosinophils Absolute: 0.1 10*3/uL (ref 0.0–0.7)
Eosinophils Relative: 1.2 % (ref 0.0–5.0)
HCT: 43.3 % (ref 36.0–46.0)
Hemoglobin: 14.8 g/dL (ref 12.0–15.0)
Lymphocytes Relative: 24.8 % (ref 12.0–46.0)
Lymphs Abs: 2.1 10*3/uL (ref 0.7–4.0)
MCHC: 34.2 g/dL (ref 30.0–36.0)
MCV: 93.5 fl (ref 78.0–100.0)
Monocytes Absolute: 0.7 10*3/uL (ref 0.1–1.0)
Monocytes Relative: 8.7 % (ref 3.0–12.0)
Neutro Abs: 5.3 10*3/uL (ref 1.4–7.7)
Neutrophils Relative %: 63.4 % (ref 43.0–77.0)
Platelets: 299 10*3/uL (ref 150.0–400.0)
RBC: 4.63 Mil/uL (ref 3.87–5.11)
RDW: 13.5 % (ref 11.5–14.6)
WBC: 8.4 10*3/uL (ref 4.5–10.5)

## 2013-07-17 LAB — D-DIMER, QUANTITATIVE (NOT AT ARMC): D-Dimer, Quant: 0.53 ug/mL-FEU — ABNORMAL HIGH (ref 0.00–0.48)

## 2013-07-17 LAB — BASIC METABOLIC PANEL
BUN: 13 mg/dL (ref 6–23)
CO2: 23 mEq/L (ref 19–32)
Calcium: 9.4 mg/dL (ref 8.4–10.5)
Chloride: 106 mEq/L (ref 96–112)
Creatinine, Ser: 0.6 mg/dL (ref 0.4–1.2)
GFR: 101.98 mL/min (ref >60.00)
Glucose, Bld: 102 mg/dL — ABNORMAL HIGH (ref 70–99)
Potassium: 4 mEq/L (ref 3.5–5.1)
Sodium: 138 mEq/L (ref 135–145)

## 2013-07-17 LAB — CREATININE KINASE MB: CK-MB: 1.6 ng/mL (ref 0.3–4.0)

## 2013-07-17 MED ORDER — DIAZEPAM 5 MG PO TABS: 5.0000 mg | ORAL_TABLET | Freq: Two times a day (BID) | ORAL | Status: DC | PRN

## 2013-07-17 NOTE — Assessment & Plan Note (Signed)
Continue with current prescription therapy as reflected on the Med list.  

## 2013-07-17 NOTE — Progress Notes (Signed)
Pre visit review using our clinic review tool, if applicable. No additional management support is needed unless otherwise documented below in the visit note. 

## 2013-07-17 NOTE — Assessment & Plan Note (Signed)
07/16/13 ?etiology EKG Labs CXR Stress test ASA qd

## 2013-07-17 NOTE — Progress Notes (Signed)
Subjective:    Chest Pain  This is a new problem. The current episode started yesterday. The onset quality is sudden. The problem has been resolved. The pain is present in the substernal region. The pain is severe. The quality of the pain is described as pressure and heavy (10 min). The pain radiates to the upper back. Associated symptoms include back pain. Pertinent negatives include no cough, diaphoresis, dizziness, fever, headaches, leg pain, nausea, numbness, palpitations, shortness of breath, sputum production, syncope, vomiting or weakness. The pain is aggravated by nothing.  Pertinent negatives for past medical history include no seizures.  Pertinent negatives for family medical history include: no CAD in family.    F/u R low back pain - she is seeing a chiropractor --moderate 3-4/10. F/u occ problems w/recalling words... F/u L neck pain - Dr Danielle Dess; neck MRI next week  The patient is here to follow up on chronic B12 def , anxiety, headaches and chronic moderate OA/LBP/hip pain symptoms controlled with medicines. She gets no exercise.   Wt Readings from Last 3 Encounters:  07/17/13 199 lb (90.266 kg)  06/18/13 199 lb (90.266 kg)  05/24/13 198 lb 14.4 oz (90.22 kg)   BP Readings from Last 3 Encounters:  07/17/13 120/78  06/18/13 124/80  06/03/13 161/66      Review of Systems  Constitutional: Positive for fatigue. Negative for fever, chills, diaphoresis, activity change, appetite change and unexpected weight change.  HENT: Negative for congestion, ear pain, facial swelling, hearing loss, mouth sores, nosebleeds, postnasal drip, rhinorrhea, sinus pressure, sneezing, sore throat, tinnitus and trouble swallowing.   Eyes: Negative for pain, discharge, redness, itching and visual disturbance.  Respiratory: Negative for cough, sputum production, chest tightness, shortness of breath, wheezing and stridor.   Cardiovascular: Negative for chest pain, palpitations, leg swelling and  syncope.  Gastrointestinal: Negative for nausea, vomiting, diarrhea, constipation, blood in stool, abdominal distention, anal bleeding and rectal pain.  Genitourinary: Negative for dysuria, urgency, frequency, hematuria, flank pain, vaginal bleeding, vaginal discharge, difficulty urinating, genital sores and pelvic pain.  Musculoskeletal: Positive for arthralgias, back pain and gait problem. Negative for joint swelling, neck pain and neck stiffness.  Skin: Negative.  Negative for rash.  Neurological: Negative for dizziness, tremors, seizures, syncope, speech difficulty, weakness, numbness and headaches.  Hematological: Negative for adenopathy. Does not bruise/bleed easily.  Psychiatric/Behavioral: Positive for sleep disturbance. Negative for suicidal ideas, behavioral problems, dysphoric mood and decreased concentration. The patient is nervous/anxious.        Objective:   Physical Exam  Constitutional: She appears well-developed. No distress.  Obese  HENT:  Head: Normocephalic.  Right Ear: External ear normal.  Left Ear: External ear normal.  Nose: Nose normal.  Mouth/Throat: Oropharynx is clear and moist.  Eyes: Conjunctivae are normal. Pupils are equal, round, and reactive to light. Right eye exhibits no discharge. Left eye exhibits no discharge.  Neck: Normal range of motion. Neck supple. No JVD present. No tracheal deviation present. No thyromegaly present.  Cardiovascular: Normal rate, regular rhythm and normal heart sounds.   Pulmonary/Chest: No stridor. No respiratory distress. She has no wheezes.  Abdominal: Soft. Bowel sounds are normal. She exhibits no distension and no mass. There is no tenderness. There is no rebound and no guarding.  Musculoskeletal: She exhibits no edema and no tenderness (LS is tender).  Lymphadenopathy:    She has no cervical adenopathy.  Neurological: She displays normal reflexes. No cranial nerve deficit. She exhibits normal muscle tone. Coordination  normal.  Skin: No rash noted. No erythema.  Psychiatric: Her behavior is normal. Judgment and thought content normal.   Lab Results  Component Value Date   WBC 9.2 05/24/2013   HGB 15.4* 05/24/2013   HCT 45.9 05/24/2013   PLT 294 05/24/2013   GLUCOSE 97 05/24/2013   CHOL 211* 06/17/2011   TRIG 96.0 06/17/2011   HDL 58.10 06/17/2011   LDLDIRECT 161.3 06/17/2011   ALT 21 06/17/2011   AST 20 06/17/2011   NA 138 05/24/2013   K 4.3 05/24/2013   CL 101 05/24/2013   CREATININE 0.65 05/24/2013   BUN 12 05/24/2013   CO2 25 05/24/2013   TSH 2.58 06/17/2011   INR 1.76* 08/29/2009          Assessment & Plan:

## 2013-07-18 ENCOUNTER — Other Ambulatory Visit: Payer: Medicare Other

## 2013-07-18 ENCOUNTER — Ambulatory Visit (INDEPENDENT_AMBULATORY_CARE_PROVIDER_SITE_OTHER)
Admission: RE | Admit: 2013-07-18 | Discharge: 2013-07-18 | Disposition: A | Discharge status: Home / Self Care | Payer: Medicare Other | Source: Ambulatory Visit | Attending: Internal Medicine | Admitting: Internal Medicine

## 2013-07-18 DIAGNOSIS — R079 Chest pain, unspecified: Secondary | ICD-10-CM

## 2013-07-18 LAB — TROPONIN I: Troponin I: 0.04 ng/mL (ref <0.06)

## 2013-07-18 MED ORDER — IOHEXOL 350 MG/ML SOLN
100.0000 mL | Freq: Once | INTRAVENOUS | Status: AC | PRN
  Administered 2013-07-18: 100 mL via INTRAVENOUS

## 2013-07-23 ENCOUNTER — Encounter (HOSPITAL_COMMUNITY): Payer: Medicare Other

## 2013-08-19 ENCOUNTER — Encounter: Payer: Self-pay | Admitting: Internal Medicine

## 2013-08-19 ENCOUNTER — Ambulatory Visit (INDEPENDENT_AMBULATORY_CARE_PROVIDER_SITE_OTHER): Payer: Medicare Other | Admitting: Internal Medicine

## 2013-08-19 VITALS — BP 140/85 | HR 72 | Temp 97.7°F | Resp 16 | Wt 202.0 lb

## 2013-08-19 DIAGNOSIS — R079 Chest pain, unspecified: Secondary | ICD-10-CM

## 2013-08-19 DIAGNOSIS — E559 Vitamin D deficiency, unspecified: Secondary | ICD-10-CM

## 2013-08-19 DIAGNOSIS — M545 Low back pain, unspecified: Secondary | ICD-10-CM

## 2013-08-19 DIAGNOSIS — F411 Generalized anxiety disorder: Secondary | ICD-10-CM

## 2013-08-19 NOTE — Assessment & Plan Note (Signed)
07/16/13 ?etiology - resolved Chest CT was ok CL was scheduled and declined - no CP

## 2013-08-19 NOTE — Progress Notes (Signed)
Subjective:    Chest Pain  This is a new problem. The current episode started yesterday. The onset quality is sudden. The problem has been resolved. The pain is present in the substernal region. The pain is severe. The quality of the pain is described as pressure and heavy (10 min). The pain radiates to the upper back. Associated symptoms include back pain. Pertinent negatives include no cough, diaphoresis, dizziness, fever, headaches, leg pain, nausea, numbness, palpitations, shortness of breath, sputum production, syncope, vomiting or weakness. The pain is aggravated by nothing.  Pertinent negatives for past medical history include no seizures.  Pertinent negatives for family medical history include: no CAD in family.    F/u R low back pain - she is seeing a chiropractor --moderate 3-4/10. F/u occ problems w/recalling words... F/u L neck pain - Dr Ellene Route; neck MRI next week  The patient is here to follow up on chronic B12 def , anxiety, headaches and chronic moderate OA/LBP/hip pain symptoms controlled with medicines. She gets no exercise.   Wt Readings from Last 3 Encounters:  08/19/13 202 lb (91.627 kg)  07/17/13 199 lb (90.266 kg)  06/18/13 199 lb (90.266 kg)   BP Readings from Last 3 Encounters:  08/19/13 160/90  07/17/13 120/78  06/18/13 124/80      Review of Systems  Constitutional: Positive for fatigue. Negative for fever, chills, diaphoresis, activity change, appetite change and unexpected weight change.  HENT: Negative for congestion, ear pain, facial swelling, hearing loss, mouth sores, nosebleeds, postnasal drip, rhinorrhea, sinus pressure, sneezing, sore throat, tinnitus and trouble swallowing.   Eyes: Negative for pain, discharge, redness, itching and visual disturbance.  Respiratory: Negative for cough, sputum production, chest tightness, shortness of breath, wheezing and stridor.   Cardiovascular: Negative for chest pain, palpitations, leg swelling and syncope.   Gastrointestinal: Negative for nausea, vomiting, diarrhea, constipation, blood in stool, abdominal distention, anal bleeding and rectal pain.  Genitourinary: Negative for dysuria, urgency, frequency, hematuria, flank pain, vaginal bleeding, vaginal discharge, difficulty urinating, genital sores and pelvic pain.  Musculoskeletal: Positive for arthralgias, back pain and gait problem. Negative for joint swelling, neck pain and neck stiffness.  Skin: Negative.  Negative for rash.  Neurological: Negative for dizziness, tremors, seizures, syncope, speech difficulty, weakness, numbness and headaches.  Hematological: Negative for adenopathy. Does not bruise/bleed easily.  Psychiatric/Behavioral: Positive for sleep disturbance. Negative for suicidal ideas, behavioral problems, dysphoric mood and decreased concentration. The patient is nervous/anxious.        Objective:   Physical Exam  Constitutional: She appears well-developed. No distress.  Obese  HENT:  Head: Normocephalic.  Right Ear: External ear normal.  Left Ear: External ear normal.  Nose: Nose normal.  Mouth/Throat: Oropharynx is clear and moist.  Eyes: Conjunctivae are normal. Pupils are equal, round, and reactive to light. Right eye exhibits no discharge. Left eye exhibits no discharge.  Neck: Normal range of motion. Neck supple. No JVD present. No tracheal deviation present. No thyromegaly present.  Cardiovascular: Normal rate, regular rhythm and normal heart sounds.   Pulmonary/Chest: No stridor. No respiratory distress. She has no wheezes.  Abdominal: Soft. Bowel sounds are normal. She exhibits no distension and no mass. There is no tenderness. There is no rebound and no guarding.  Musculoskeletal: She exhibits no edema and no tenderness (LS is tender).  Lymphadenopathy:    She has no cervical adenopathy.  Neurological: She displays normal reflexes. No cranial nerve deficit. She exhibits normal muscle tone. Coordination normal.   Skin: No  rash noted. No erythema.  Psychiatric: Her behavior is normal. Judgment and thought content normal.   Lab Results  Component Value Date   WBC 8.4 07/17/2013   HGB 14.8 07/17/2013   HCT 43.3 07/17/2013   PLT 299.0 07/17/2013   GLUCOSE 102* 07/17/2013   CHOL 211* 06/17/2011   TRIG 96.0 06/17/2011   HDL 58.10 06/17/2011   LDLDIRECT 161.3 06/17/2011   ALT 21 06/17/2011   AST 20 06/17/2011   NA 138 07/17/2013   K 4.0 07/17/2013   CL 106 07/17/2013   CREATININE 0.6 07/17/2013   BUN 13 07/17/2013   CO2 23 07/17/2013   TSH 2.58 06/17/2011   INR 1.76* 08/29/2009          Assessment & Plan:

## 2013-08-19 NOTE — Assessment & Plan Note (Signed)
Continue with current prescription therapy as reflected on the Med list.  

## 2013-08-19 NOTE — Progress Notes (Signed)
Pre visit review using our clinic review tool, if applicable. No additional management support is needed unless otherwise documented below in the visit note. 

## 2013-08-19 NOTE — Assessment & Plan Note (Signed)
Continue with current prn prescription therapy as reflected on the Med list.  

## 2013-10-16 ENCOUNTER — Other Ambulatory Visit: Payer: Self-pay

## 2013-10-16 MED ORDER — NONFORMULARY OR COMPOUNDED ITEM: Status: DC

## 2013-10-16 NOTE — Telephone Encounter (Signed)
Called into pharmacy

## 2013-10-28 ENCOUNTER — Ambulatory Visit (INDEPENDENT_AMBULATORY_CARE_PROVIDER_SITE_OTHER): Payer: Medicare Other | Admitting: Internal Medicine

## 2013-10-28 ENCOUNTER — Encounter: Payer: Self-pay | Admitting: Internal Medicine

## 2013-10-28 VITALS — BP 160/86 | HR 80 | Temp 98.7°F | Resp 16

## 2013-10-28 DIAGNOSIS — J320 Chronic maxillary sinusitis: Secondary | ICD-10-CM | POA: Insufficient documentation

## 2013-10-28 MED ORDER — LEVOFLOXACIN 500 MG PO TABS: 500.0000 mg | ORAL_TABLET | Freq: Every day | ORAL | Status: DC

## 2013-10-28 MED ORDER — SACCHAROMYCES BOULARDII 250 MG PO CAPS: 250.0000 mg | ORAL_CAPSULE | Freq: Two times a day (BID) | ORAL | Status: DC

## 2013-10-28 NOTE — Progress Notes (Signed)
Pre visit review using our clinic review tool, if applicable. No additional management support is needed unless otherwise documented below in the visit note. 

## 2013-10-28 NOTE — Assessment & Plan Note (Signed)
C/o R sinusitis x few months. It was noted on dental xray by oral surgeon on 10/09/13. She was given Augmentin and had a diarrhea (stopped in 3 d). She was given Flagyl - not better  Florastor x 1 mo Levaquin x 14 d

## 2013-10-28 NOTE — Progress Notes (Signed)
Subjective:    HPI  C/o R sinusitis x few months. It was noted on dental xray by oral surgeon on 10/09/13. She was given Augmentin and had a diarrhea (stopped in 3 d). She was given Flagyl - not better   F/u R low back pain - she is seeing a chiropractor --moderate 3-4/10. F/u occ problems w/recalling words... F/u L neck pain - Dr Ellene Route; neck MRI next week  The patient is here to follow up on chronic B12 def , anxiety, headaches and chronic moderate OA/LBP/hip pain symptoms controlled with medicines. She gets no exercise.   Wt Readings from Last 3 Encounters:  08/19/13 202 lb (91.627 kg)  07/17/13 199 lb (90.266 kg)  06/18/13 199 lb (90.266 kg)   BP Readings from Last 3 Encounters:  10/28/13 160/86  08/19/13 140/85  07/17/13 120/78      Review of Systems  Constitutional: Positive for fatigue. Negative for chills, activity change, appetite change and unexpected weight change.  HENT: Negative for congestion, ear pain, facial swelling, hearing loss, mouth sores, nosebleeds, postnasal drip, rhinorrhea, sinus pressure, sneezing, sore throat, tinnitus and trouble swallowing.   Eyes: Negative for pain, discharge, redness, itching and visual disturbance.  Respiratory: Negative for chest tightness, wheezing and stridor.   Cardiovascular: Negative for leg swelling.  Gastrointestinal: Negative for diarrhea, constipation, blood in stool, abdominal distention, anal bleeding and rectal pain.  Genitourinary: Negative for dysuria, urgency, frequency, hematuria, flank pain, vaginal bleeding, vaginal discharge, difficulty urinating, genital sores and pelvic pain.  Musculoskeletal: Positive for arthralgias and gait problem. Negative for joint swelling, neck pain and neck stiffness.  Skin: Negative.  Negative for rash.  Neurological: Negative for tremors, syncope and speech difficulty.  Hematological: Negative for adenopathy. Does not bruise/bleed easily.  Psychiatric/Behavioral: Positive for  sleep disturbance. Negative for suicidal ideas, behavioral problems, dysphoric mood and decreased concentration. The patient is nervous/anxious.        Objective:   Physical Exam  Constitutional: She appears well-developed. No distress.  Obese  HENT:  Head: Normocephalic.  Right Ear: External ear normal.  Left Ear: External ear normal.  Nose: Nose normal.  Mouth/Throat: Oropharynx is clear and moist.  Eyes: Conjunctivae are normal. Pupils are equal, round, and reactive to light. Right eye exhibits no discharge. Left eye exhibits no discharge.  Neck: Normal range of motion. Neck supple. No JVD present. No tracheal deviation present. No thyromegaly present.  Cardiovascular: Normal rate, regular rhythm and normal heart sounds.   Pulmonary/Chest: No stridor. No respiratory distress. She has no wheezes.  Abdominal: Soft. Bowel sounds are normal. She exhibits no distension and no mass. There is no tenderness. There is no rebound and no guarding.  Musculoskeletal: She exhibits no edema and no tenderness (LS is tender).  Lymphadenopathy:    She has no cervical adenopathy.  Neurological: She displays normal reflexes. No cranial nerve deficit. She exhibits normal muscle tone. Coordination normal.  Skin: No rash noted. No erythema.  Psychiatric: Her behavior is normal. Judgment and thought content normal.  R cheek bone is tender Lab Results  Component Value Date   WBC 8.4 07/17/2013   HGB 14.8 07/17/2013   HCT 43.3 07/17/2013   PLT 299.0 07/17/2013   GLUCOSE 102* 07/17/2013   CHOL 211* 06/17/2011   TRIG 96.0 06/17/2011   HDL 58.10 06/17/2011   LDLDIRECT 161.3 06/17/2011   ALT 21 06/17/2011   AST 20 06/17/2011   NA 138 07/17/2013   K 4.0 07/17/2013   CL 106 07/17/2013  CREATININE 0.6 07/17/2013   BUN 13 07/17/2013   CO2 23 07/17/2013   TSH 2.58 06/17/2011   INR 1.76* 08/29/2009          Assessment & Plan:

## 2013-11-08 ENCOUNTER — Telehealth: Payer: Self-pay | Admitting: *Deleted

## 2013-11-08 MED ORDER — AMOXICILLIN 500 MG PO CAPS: 2000.0000 mg | ORAL_CAPSULE | Freq: Every day | ORAL | Status: DC

## 2013-11-08 NOTE — Telephone Encounter (Signed)
Pt called regarding ATB.  Pt stated that she will be finished with the Levaquin 500mg  on Sunday, and she has a dentist appt on Tues.  She is to be premedicated before cleaning, so she want to know if she should be put back on ATB's are was the Levaquin enough to cover her for the cleaning. What is she to do.  She has an appt with Dr. Alain Marion on Wed.//AB/CMA

## 2013-11-08 NOTE — Telephone Encounter (Signed)
Take Amoxicillin on Tue 2000 mg 1 h prior once (emailed) Thx

## 2013-11-11 NOTE — Telephone Encounter (Signed)
Spoke with the pt and informed her of Dr. Alain Marion note below.  Pt understood and agreed.  Pt stated that she received a call from the pharmacy and she has picked up the ATB.//AB/CMA

## 2013-11-13 ENCOUNTER — Ambulatory Visit (INDEPENDENT_AMBULATORY_CARE_PROVIDER_SITE_OTHER): Payer: Medicare Other | Admitting: Internal Medicine

## 2013-11-13 ENCOUNTER — Encounter: Payer: Self-pay | Admitting: Internal Medicine

## 2013-11-13 ENCOUNTER — Other Ambulatory Visit (INDEPENDENT_AMBULATORY_CARE_PROVIDER_SITE_OTHER): Payer: Medicare Other

## 2013-11-13 VITALS — BP 142/80 | HR 80 | Resp 16 | Wt 199.0 lb

## 2013-11-13 DIAGNOSIS — M545 Low back pain, unspecified: Secondary | ICD-10-CM

## 2013-11-13 DIAGNOSIS — D518 Other vitamin B12 deficiency anemias: Secondary | ICD-10-CM

## 2013-11-13 DIAGNOSIS — E538 Deficiency of other specified B group vitamins: Secondary | ICD-10-CM

## 2013-11-13 DIAGNOSIS — E559 Vitamin D deficiency, unspecified: Secondary | ICD-10-CM

## 2013-11-13 DIAGNOSIS — L659 Nonscarring hair loss, unspecified: Secondary | ICD-10-CM

## 2013-11-13 DIAGNOSIS — J320 Chronic maxillary sinusitis: Secondary | ICD-10-CM

## 2013-11-13 LAB — BASIC METABOLIC PANEL
BUN: 13 mg/dL (ref 6–23)
CO2: 29 mEq/L (ref 19–32)
Calcium: 9.6 mg/dL (ref 8.4–10.5)
Chloride: 101 mEq/L (ref 96–112)
Creatinine, Ser: 0.7 mg/dL (ref 0.4–1.2)
GFR: 93.04 mL/min (ref >60.00)
Glucose, Bld: 93 mg/dL (ref 70–99)
Potassium: 3.8 mEq/L (ref 3.5–5.1)
Sodium: 137 mEq/L (ref 135–145)

## 2013-11-13 LAB — VITAMIN B12: Vitamin B-12: 495 pg/mL (ref 211–911)

## 2013-11-13 NOTE — Assessment & Plan Note (Signed)
Continue with current prescription therapy as reflected on the Med list.  

## 2013-11-13 NOTE — Assessment & Plan Note (Signed)
98% worse

## 2013-11-13 NOTE — Progress Notes (Signed)
Pre visit review using our clinic review tool, if applicable. No additional management support is needed unless otherwise documented below in the visit note. 

## 2013-11-13 NOTE — Progress Notes (Signed)
Subjective:    HPI  F/u R sinusitis x few months -  resolved. It was noted on dental xray by oral surgeon on 10/09/13. She was given Augmentin and had a diarrhea (stopped in 3 d). She was given Flagyl - not better   F/u R low back pain - she is seeing a chiropractor --moderate 3-4/10. F/u occ problems w/recalling words... F/u L neck pain - Dr Ellene Route; neck MRI next week  The patient is here to follow up on chronic B12 def , anxiety, headaches and chronic moderate OA/LBP/hip pain symptoms controlled with medicines. She gets no exercise.   Wt Readings from Last 3 Encounters:  11/13/13 199 lb (90.266 kg)  08/19/13 202 lb (91.627 kg)  07/17/13 199 lb (90.266 kg)   BP Readings from Last 3 Encounters:  11/13/13 142/80  10/28/13 160/86  08/19/13 140/85      Review of Systems  Constitutional: Positive for fatigue. Negative for chills, activity change, appetite change and unexpected weight change.  HENT: Negative for congestion, ear pain, facial swelling, hearing loss, mouth sores, nosebleeds, postnasal drip, rhinorrhea, sinus pressure, sneezing, sore throat, tinnitus and trouble swallowing.   Eyes: Negative for pain, discharge, redness, itching and visual disturbance.  Respiratory: Negative for chest tightness, wheezing and stridor.   Cardiovascular: Negative for leg swelling.  Gastrointestinal: Negative for diarrhea, constipation, blood in stool, abdominal distention, anal bleeding and rectal pain.  Genitourinary: Negative for dysuria, urgency, frequency, hematuria, flank pain, vaginal bleeding, vaginal discharge, difficulty urinating, genital sores and pelvic pain.  Musculoskeletal: Positive for arthralgias and gait problem. Negative for joint swelling, neck pain and neck stiffness.  Skin: Negative.  Negative for rash.  Neurological: Negative for tremors, syncope and speech difficulty.  Hematological: Negative for adenopathy. Does not bruise/bleed easily.  Psychiatric/Behavioral:  Positive for sleep disturbance. Negative for suicidal ideas, behavioral problems, dysphoric mood and decreased concentration. The patient is nervous/anxious.        Objective:   Physical Exam  Constitutional: She appears well-developed. No distress.  Obese  HENT:  Head: Normocephalic.  Right Ear: External ear normal.  Left Ear: External ear normal.  Nose: Nose normal.  Mouth/Throat: Oropharynx is clear and moist.  Eyes: Conjunctivae are normal. Pupils are equal, round, and reactive to light. Right eye exhibits no discharge. Left eye exhibits no discharge.  Neck: Normal range of motion. Neck supple. No JVD present. No tracheal deviation present. No thyromegaly present.  Cardiovascular: Normal rate, regular rhythm and normal heart sounds.   Pulmonary/Chest: No stridor. No respiratory distress. She has no wheezes.  Abdominal: Soft. Bowel sounds are normal. She exhibits no distension and no mass. There is no tenderness. There is no rebound and no guarding.  Musculoskeletal: She exhibits no edema and no tenderness (LS is tender).  Lymphadenopathy:    She has no cervical adenopathy.  Neurological: She displays normal reflexes. No cranial nerve deficit. She exhibits normal muscle tone. Coordination normal.  Skin: No rash noted. No erythema.  Psychiatric: Her behavior is normal. Judgment and thought content normal.  R cheek bone is not tender  Lab Results  Component Value Date   WBC 8.4 07/17/2013   HGB 14.8 07/17/2013   HCT 43.3 07/17/2013   PLT 299.0 07/17/2013   GLUCOSE 102* 07/17/2013   CHOL 211* 06/17/2011   TRIG 96.0 06/17/2011   HDL 58.10 06/17/2011   LDLDIRECT 161.3 06/17/2011   ALT 21 06/17/2011   AST 20 06/17/2011   NA 138 07/17/2013   K 4.0 07/17/2013  CL 106 07/17/2013   CREATININE 0.6 07/17/2013   BUN 13 07/17/2013   CO2 23 07/17/2013   TSH 2.58 06/17/2011   INR 1.76* 08/29/2009          Assessment & Plan:

## 2013-11-13 NOTE — Assessment & Plan Note (Signed)
Labs

## 2013-11-13 NOTE — Assessment & Plan Note (Signed)
Labs  Continue with current prescription therapy as reflected on the Med list.  

## 2013-11-13 NOTE — Assessment & Plan Note (Signed)
Doing well 

## 2013-12-02 ENCOUNTER — Other Ambulatory Visit: Payer: Self-pay | Admitting: Internal Medicine

## 2013-12-06 ENCOUNTER — Telehealth: Payer: Self-pay | Admitting: *Deleted

## 2013-12-06 MED ORDER — LEVOFLOXACIN 500 MG PO TABS: 500.0000 mg | ORAL_TABLET | Freq: Every day | ORAL | Status: DC

## 2013-12-06 MED ORDER — DIAZEPAM 5 MG PO TABS: 5.0000 mg | ORAL_TABLET | Freq: Two times a day (BID) | ORAL | Status: DC | PRN

## 2013-12-06 NOTE — Telephone Encounter (Signed)
Ok Levaquin x 2 wks Thx

## 2013-12-06 NOTE — Telephone Encounter (Signed)
Pt informed . She also needs Rf pn Diazepam. Ok to Rf per MD. Rf phoned in.

## 2013-12-06 NOTE — Telephone Encounter (Signed)
Pt left vm stating her URI sxs have returned. C/o teeth pain and back pain.She is requesting a refill on Levaquin. Please advise.

## 2014-01-09 IMAGING — CT CT ANGIO CHEST
2 of 6 series · 19 of 36 positions shown · IV contrast (Omnipaque 300)
Comparison: 07/17/2013 chest radiograph.

CLINICAL DATA: Elevated D-dimer.  Pulmonary embolism.  Chest pain.

EXAM:
CT ANGIOGRAPHY CHEST WITH CONTRAST
TECHNIQUE: Multidetector CT imaging of the chest was performed using the
standard protocol during bolus administration of intravenous
contrast. Multiplanar CT image reconstructions including MIPs were
obtained to evaluate the vascular anatomy.
CONTRAST:  100mL OMNIPAQUE IOHEXOL 350 MG/ML SOLN

[Series 6: thins 1.0mm / 0.8mm · axial · 0.75mm/px · z∈[-272,-33]mm · 18 of 267 slices shown]
[im 14/267  lung]
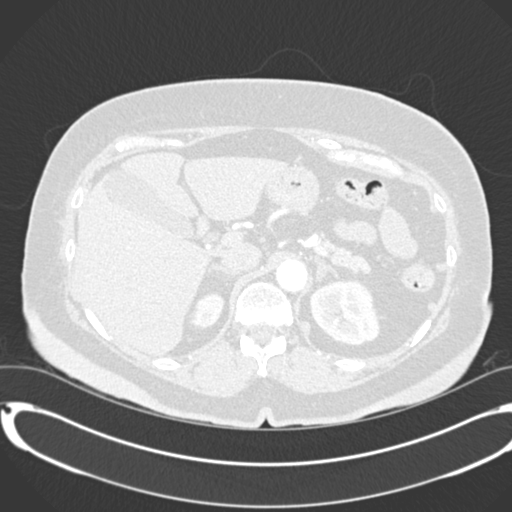
[im 27/267  mediastinal]
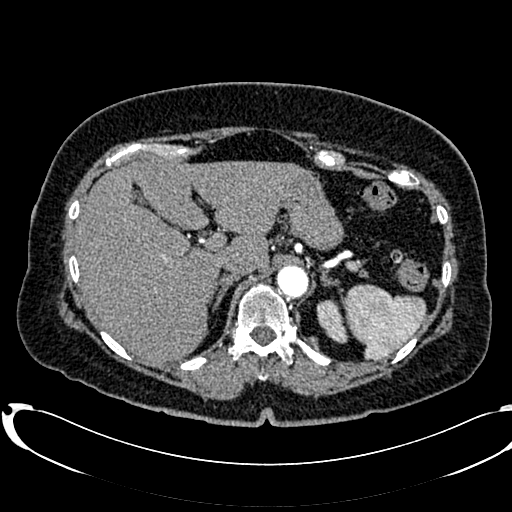
[im 40/267  lung]
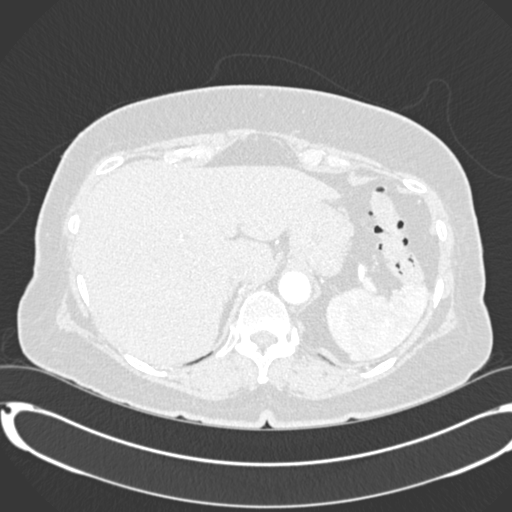
[im 54/267  mediastinal]
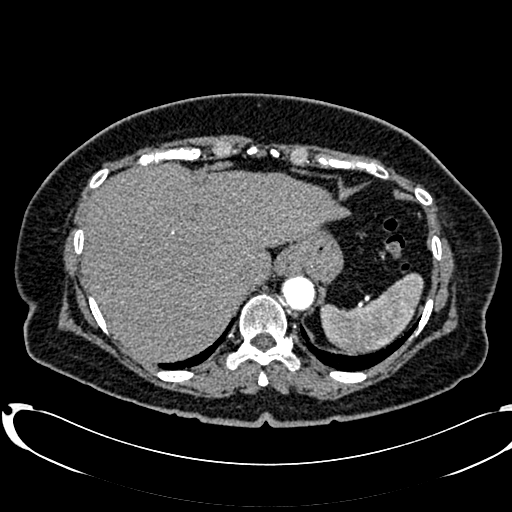
[im 67/267  lung]
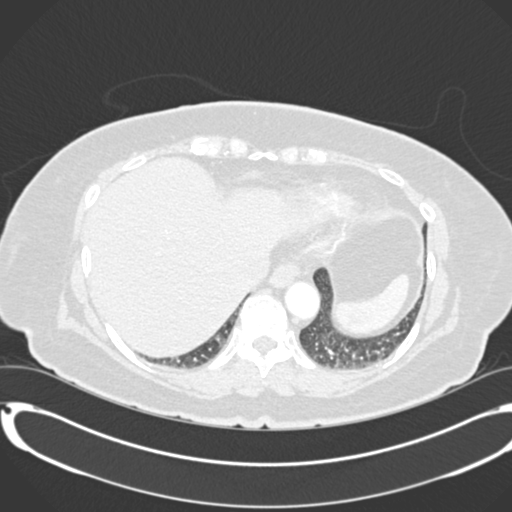
[im 80/267  mediastinal]
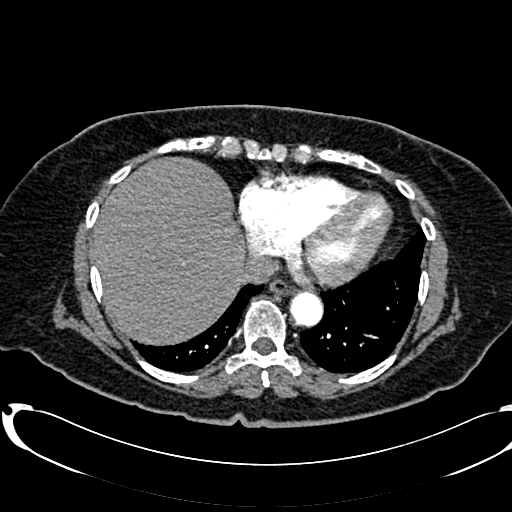
[im 94/267  lung]
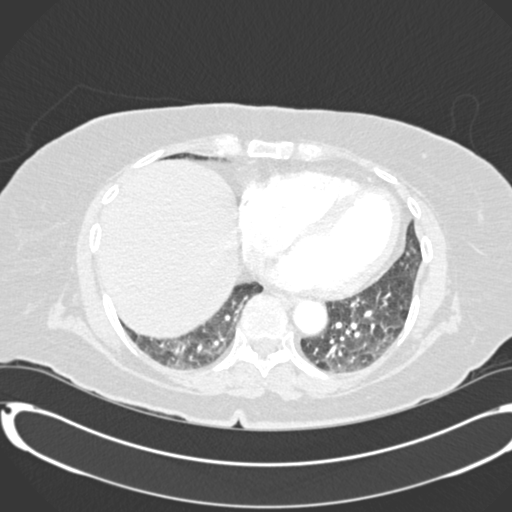
[im 107/267  mediastinal]
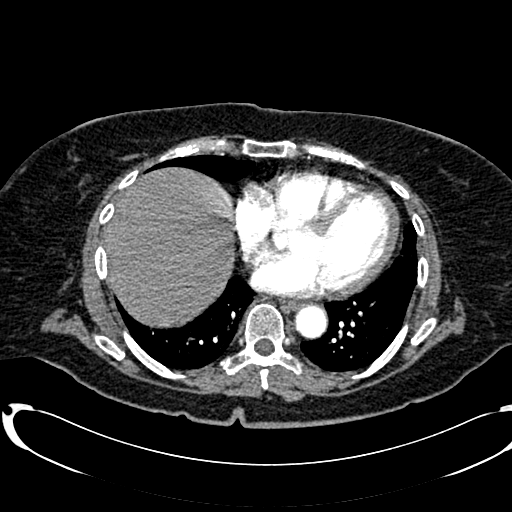
[im 120/267  lung]
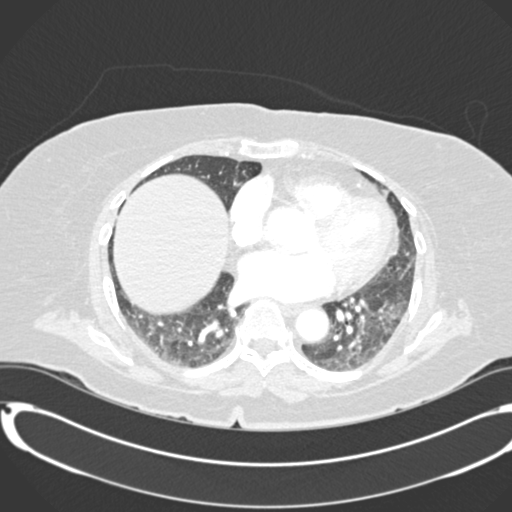
[im 147/267  mediastinal]
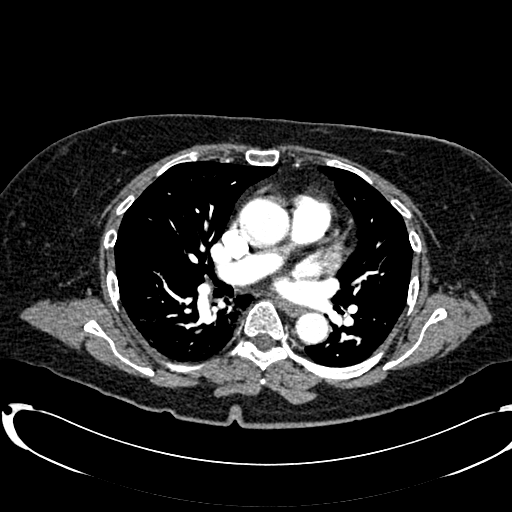
[im 160/267  lung]
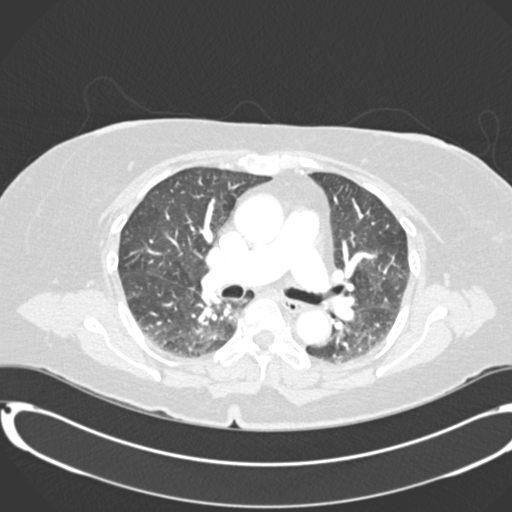
[im 173/267  mediastinal]
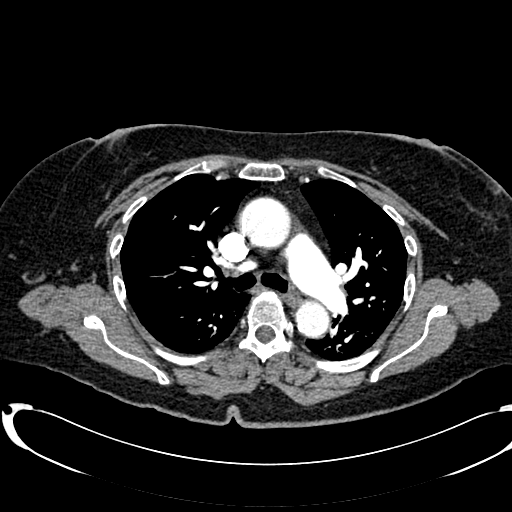
[im 187/267  lung]
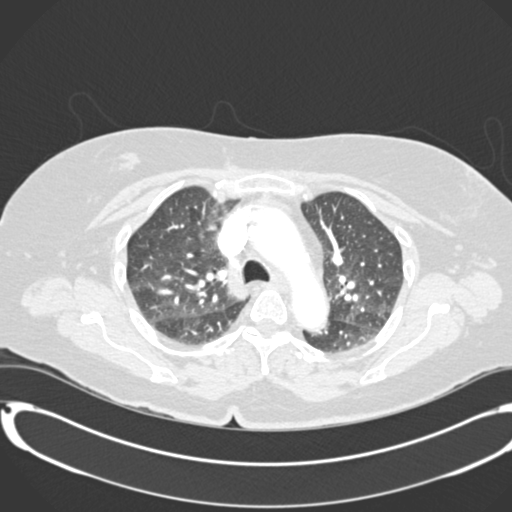
[im 200/267  mediastinal]
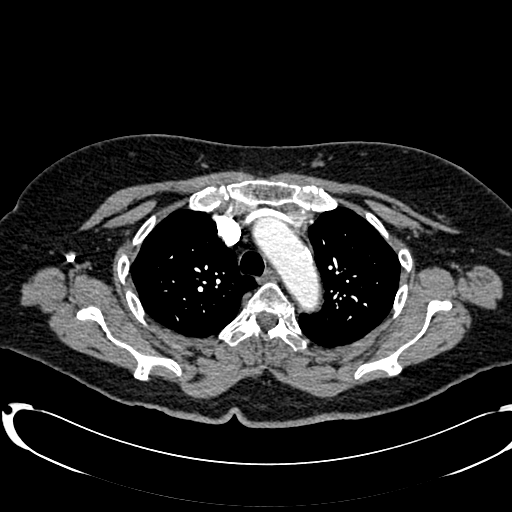
[im 213/267  lung]
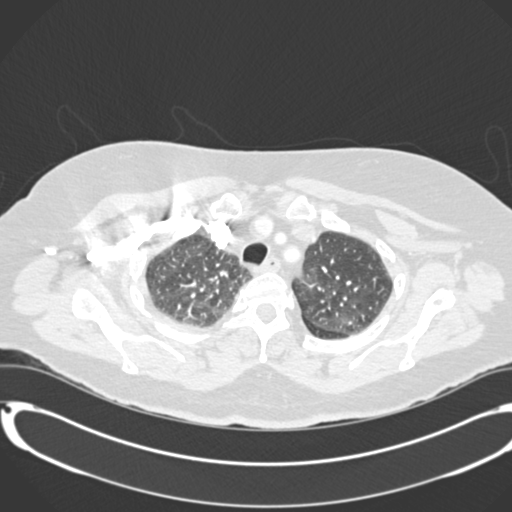
[im 227/267  mediastinal]
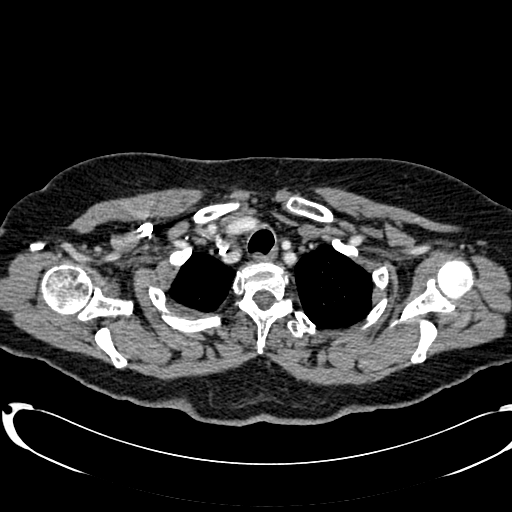
[im 240/267  lung]
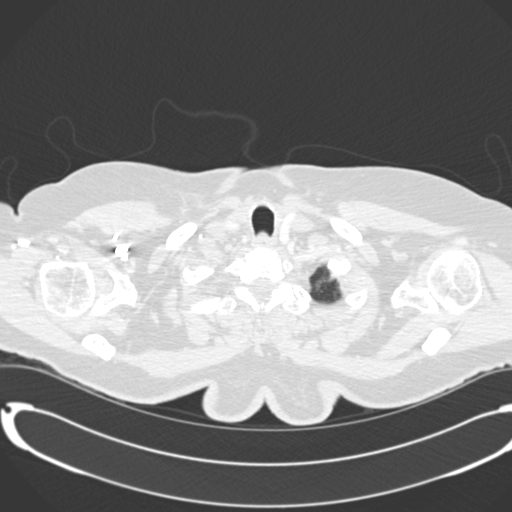
[im 253/267  mediastinal]
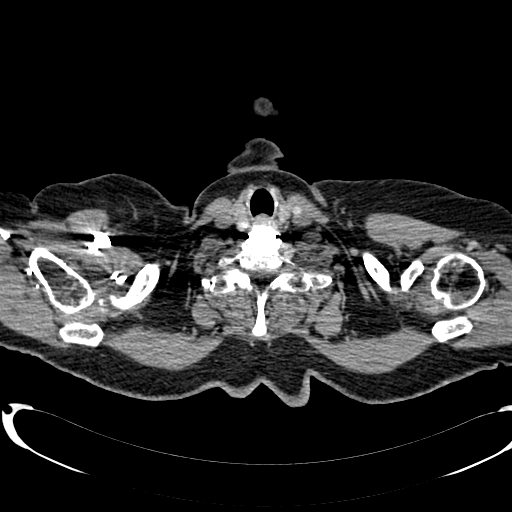

[Series 602: coronals · coronal · 0.75mm/px · 1 of 118 slices shown]
[im 59/118  mediastinal]
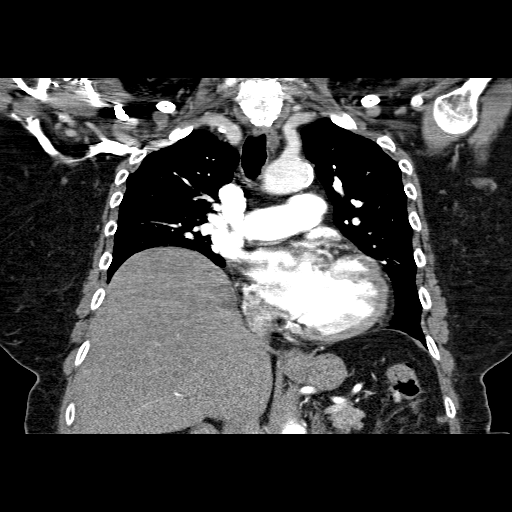

[19 of 36 positions shown; findings below may reference images not displayed]

FINDINGS: Study is technically adequate for evaluation of pulmonary embolus
and no pulmonary embolism is present. Aorta and branch vessels
demonstrate mild atherosclerosis. No dissection or acute aortic
abnormality. Small hiatal hernia incidentally noted. Hypervascular
lesion is present in the left hepatic lobe measuring 5 mm (segment
2), likely representing a flash fill hemangioma. Tiny hepatic cyst
is present in segment 4A. No pericardial or pleural effusion.

Lung windows demonstrate dependent atelectasis. No airspace disease.
Scattered areas of ground-glass attenuation are present with mosaic
attenuation which may be due to air trapping based on the
atelectasis. This can also be associated with small airway/ small
vessel disease. There are no aggressive osseous lesions. Lower
cervical ACDF partially visualized.

Review of the MIP images confirms the above findings.
IMPRESSION: 1. Negative for pulmonary embolus or acute abnormality.
2. Lungs demonstrate mosaic attenuation compatible with air
trapping. This can be associated with small airway/small vessel
disease.
3. Tiny liver cyst and flash fill hemangioma in the left hepatic
lobe.

## 2014-03-12 ENCOUNTER — Ambulatory Visit (INDEPENDENT_AMBULATORY_CARE_PROVIDER_SITE_OTHER): Payer: Medicare Other | Admitting: Internal Medicine

## 2014-03-12 ENCOUNTER — Encounter: Payer: Self-pay | Admitting: Internal Medicine

## 2014-03-12 VITALS — BP 140/82 | HR 80 | Temp 97.7°F | Resp 16 | Wt 198.0 lb

## 2014-03-12 DIAGNOSIS — E538 Deficiency of other specified B group vitamins: Secondary | ICD-10-CM

## 2014-03-12 DIAGNOSIS — M545 Low back pain, unspecified: Secondary | ICD-10-CM

## 2014-03-12 DIAGNOSIS — J320 Chronic maxillary sinusitis: Secondary | ICD-10-CM

## 2014-03-12 DIAGNOSIS — R413 Other amnesia: Secondary | ICD-10-CM

## 2014-03-12 DIAGNOSIS — D518 Other vitamin B12 deficiency anemias: Secondary | ICD-10-CM

## 2014-03-12 DIAGNOSIS — E669 Obesity, unspecified: Secondary | ICD-10-CM

## 2014-03-12 DIAGNOSIS — F411 Generalized anxiety disorder: Secondary | ICD-10-CM

## 2014-03-12 MED ORDER — TRIAMCINOLONE ACETONIDE 0.5 % EX CREA: 1.0000 "application " | TOPICAL_CREAM | Freq: Three times a day (TID) | CUTANEOUS | Status: DC

## 2014-03-12 MED ORDER — DIAZEPAM 5 MG PO TABS: 5.0000 mg | ORAL_TABLET | Freq: Two times a day (BID) | ORAL | Status: DC | PRN

## 2014-03-12 MED ORDER — HYDROCODONE-ACETAMINOPHEN 5-325 MG PO TABS: 1.0000 | ORAL_TABLET | ORAL | Status: DC | PRN

## 2014-03-12 NOTE — Assessment & Plan Note (Signed)
Continue with current prescription therapy as reflected on the Med list.  

## 2014-03-12 NOTE — Progress Notes (Signed)
Subjective:    HPI  C/o stress w/son  F/u R sinusitis x few months -  resolved. It was noted on dental xray by oral surgeon on 10/09/13. She was given Augmentin and had a diarrhea (stopped in 3 d). She was given Flagyl - not better   F/u R low back pain - she is seeing a chiropractor --moderate 3-4/10. F/u occ problems w/recalling words... F/u L neck pain - Dr Ellene Route; neck MRI next week  The patient is here to follow up on chronic B12 def , anxiety, headaches and chronic moderate OA/LBP/hip pain symptoms controlled with medicines. She gets no exercise.   Wt Readings from Last 3 Encounters:  03/12/14 198 lb (89.812 kg)  11/13/13 199 lb (90.266 kg)  08/19/13 202 lb (91.627 kg)   BP Readings from Last 3 Encounters:  03/12/14 140/82  11/13/13 142/80  10/28/13 160/86      Review of Systems  Constitutional: Positive for fatigue. Negative for chills, activity change, appetite change and unexpected weight change.  HENT: Negative for congestion, ear pain, facial swelling, hearing loss, mouth sores, nosebleeds, postnasal drip, rhinorrhea, sinus pressure, sneezing, sore throat, tinnitus and trouble swallowing.   Eyes: Negative for pain, discharge, redness, itching and visual disturbance.  Respiratory: Negative for chest tightness, wheezing and stridor.   Cardiovascular: Negative for leg swelling.  Gastrointestinal: Negative for diarrhea, constipation, blood in stool, abdominal distention, anal bleeding and rectal pain.  Genitourinary: Negative for dysuria, urgency, frequency, hematuria, flank pain, vaginal bleeding, vaginal discharge, difficulty urinating, genital sores and pelvic pain.  Musculoskeletal: Positive for arthralgias and gait problem. Negative for joint swelling, neck pain and neck stiffness.  Skin: Negative.  Negative for rash.  Neurological: Negative for tremors, syncope and speech difficulty.  Hematological: Negative for adenopathy. Does not bruise/bleed easily.   Psychiatric/Behavioral: Positive for sleep disturbance. Negative for suicidal ideas, behavioral problems, dysphoric mood and decreased concentration. The patient is nervous/anxious.        Objective:   Physical Exam  Constitutional: She appears well-developed. No distress.  Obese  HENT:  Head: Normocephalic.  Right Ear: External ear normal.  Left Ear: External ear normal.  Nose: Nose normal.  Mouth/Throat: Oropharynx is clear and moist.  Eyes: Conjunctivae are normal. Pupils are equal, round, and reactive to light. Right eye exhibits no discharge. Left eye exhibits no discharge.  Neck: Normal range of motion. Neck supple. No JVD present. No tracheal deviation present. No thyromegaly present.  Cardiovascular: Normal rate, regular rhythm and normal heart sounds.   Pulmonary/Chest: No stridor. No respiratory distress. She has no wheezes.  Abdominal: Soft. Bowel sounds are normal. She exhibits no distension and no mass. There is no tenderness. There is no rebound and no guarding.  Musculoskeletal: She exhibits no edema and no tenderness (LS is tender).  Lymphadenopathy:    She has no cervical adenopathy.  Neurological: She displays normal reflexes. No cranial nerve deficit. She exhibits normal muscle tone. Coordination normal.  Skin: No rash noted. No erythema.  Psychiatric: Her behavior is normal. Judgment and thought content normal.  R cheek bone is not tender Anxious  Lab Results  Component Value Date   WBC 8.4 07/17/2013   HGB 14.8 07/17/2013   HCT 43.3 07/17/2013   PLT 299.0 07/17/2013   GLUCOSE 93 11/13/2013   CHOL 211* 06/17/2011   TRIG 96.0 06/17/2011   HDL 58.10 06/17/2011   LDLDIRECT 161.3 06/17/2011   ALT 21 06/17/2011   AST 20 06/17/2011   NA 137 11/13/2013  K 3.8 11/13/2013   CL 101 11/13/2013   CREATININE 0.7 11/13/2013   BUN 13 11/13/2013   CO2 29 11/13/2013   TSH 2.58 06/17/2011   INR 1.76* 08/29/2009          Assessment & Plan:

## 2014-03-12 NOTE — Assessment & Plan Note (Signed)
Resolved

## 2014-03-12 NOTE — Assessment & Plan Note (Signed)
Chronic and mild, stress related

## 2014-03-12 NOTE — Assessment & Plan Note (Signed)
Continue with current prn prescription therapy as reflected on the Med list.  

## 2014-03-12 NOTE — Progress Notes (Signed)
Pre visit review using our clinic review tool, if applicable. No additional management support is needed unless otherwise documented below in the visit note. 

## 2014-03-12 NOTE — Assessment & Plan Note (Signed)
Wt Readings from Last 3 Encounters:  03/12/14 198 lb (89.812 kg)  11/13/13 199 lb (90.266 kg)  08/19/13 202 lb (91.627 kg)

## 2014-05-14 ENCOUNTER — Ambulatory Visit (INDEPENDENT_AMBULATORY_CARE_PROVIDER_SITE_OTHER): Payer: Medicare Other | Admitting: Gynecology

## 2014-05-14 ENCOUNTER — Encounter: Payer: Self-pay | Admitting: Gynecology

## 2014-05-14 VITALS — BP 124/74 | Ht 64.0 in | Wt 202.0 lb

## 2014-05-14 DIAGNOSIS — M8580 Other specified disorders of bone density and structure, unspecified site: Secondary | ICD-10-CM

## 2014-05-14 DIAGNOSIS — Z79890 Hormone replacement therapy (postmenopausal): Secondary | ICD-10-CM

## 2014-05-14 DIAGNOSIS — Z23 Encounter for immunization: Secondary | ICD-10-CM

## 2014-05-14 DIAGNOSIS — N952 Postmenopausal atrophic vaginitis: Secondary | ICD-10-CM

## 2014-05-14 MED ORDER — NONFORMULARY OR COMPOUNDED ITEM: Status: DC

## 2014-05-14 NOTE — Patient Instructions (Signed)

## 2014-05-14 NOTE — Progress Notes (Signed)
[name] [birth year] [id number]   History:    73 y.o.  for GYN exam and followup. Patient had been diagnosed in the past as having lichen sclerosis and has been applying clobetasol 0.05% once weekly and has done well. For her vaginal atrophy she has been applying estradiol and 0.02% twice a week. 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 The patient has follow with a general surgeon.  She is being followed by Dr. Alain Marion who has done her lab work. .Review record indicated also that she had a normal colonoscopy in 2008. Although several years ago in [place] she had benign colon polyp removed.She had a breast reduction in the year 2000. She had a left hip replacement  5  years ago and a right hip replacement last year. She has had a cervical discectomy C2-C3, C3-C4 in 1995 and 2009 respectively.   She's been followed closely by dermatologist Dr. Martin[place] checks. Her last bone density study here in our office was in 2014 and her lowest T. score was at the distal one third of the radius with a value of -1.3 (osteopenia).Patient with history of supracervical hysterectomy October 2005 for adenomyosis and benign fibroids. The patient several years ago had history vitamin D deficiency. She is currently taking her calcium and vitamin D. Her PCP has been doing her blood work.    Past medical history,surgical history, family history and social history were all reviewed and documented in the EPIC chart.  Gynecologic History No LMP recorded. Patient has had a hysterectomy. Contraception: status post hysterectomy Last Pap: 2012. Results were: normal Last mammogram: 2014. Results were: See note above  Obstetric History OB History  Gravida Para Term Preterm AB SAB TAB Ectopic Multiple Living  2 1   1  1   1     # Outcome Date GA Lbr Len/2nd Weight Sex  Delivery Anes PTL Lv  2 TAB           1 PAR     M SVD          ROS: A ROS was performed and pertinent positives and negatives are included in the history.  GENERAL: No fevers or chills. HEENT: No change in vision, no earache, sore throat or sinus congestion. NECK: No pain or stiffness. CARDIOVASCULAR: No chest pain or pressure. No palpitations. PULMONARY: No shortness of breath, cough or wheeze. GASTROINTESTINAL: No abdominal pain, nausea, vomiting or diarrhea, melena or bright red blood per rectum. GENITOURINARY: No urinary frequency, urgency, hesitancy or dysuria. MUSCULOSKELETAL: No joint or muscle pain, no back pain, no recent trauma. DERMATOLOGIC: No rash, no itching, no lesions. ENDOCRINE: No polyuria, polydipsia, no heat or cold intolerance. No recent change in weight. HEMATOLOGICAL: No anemia or easy bruising or bleeding. NEUROLOGIC: No headache, seizures, numbness, tingling or weakness. PSYCHIATRIC: No depression, no loss of interest in normal activity or change in sleep pattern.     Exam: chaperone present  BP 124/74  Ht 5\' 4"  (1.626 m)  Wt 202 lb (91.627 kg)  BMI 34.66 kg/m2  Body mass index is 34.66 kg/(m^2).  General appearance : Well developed well nourished female. No acute distress HEENT: Neck supple, trachea midline, no carotid bruits, no thyroidmegaly Lungs: Clear to auscultation, no rhonchi or wheezes, or rib retractions  Heart: Regular rate and rhythm, no murmurs or gallops Breast:Examined in sitting and supine position were symmetrical  in appearance, no palpable masses or tenderness,  no skin retraction, no nipple inversion, no nipple discharge, no skin discoloration, no axillary or supraclavicular lymphadenopathy Abdomen: no palpable masses or tenderness, no rebound or guarding Extremities: no edema or skin discoloration or tenderness  Pelvic:  Bartholin, Urethra, Skene Glands: Within normal limits             Vagina: No gross lesions or discharge, atrophic  changes  Cervix: Absent  Uterus  absent  Adnexa  Without masses or tenderness  Anus and perineum  normal   Rectovaginal  normal sphincter tone without palpated masses or tenderness             Hemoccult PCP provides     Assessment/Plan:  73 y.o. female during well with clobetasol weekly application for lichen sclerosus. She is also doing well her vaginal dryness with the twice a week intravaginal estrogen application. Pap smear guidelines discussed she will no longer need them. We discussed importance of calcium vitamin D and regular exercise for osteoporosis prevention. Patient received the flu vaccine today. Patient will contact her gastroenterologist her next colonoscopy.   Terrance Mass MD, 3:11 PM 05/14/2014

## 2014-06-02 ENCOUNTER — Encounter: Payer: Self-pay | Admitting: Gynecology

## 2014-06-06 ENCOUNTER — Encounter: Payer: Self-pay | Admitting: Gynecology

## 2014-06-17 ENCOUNTER — Ambulatory Visit (INDEPENDENT_AMBULATORY_CARE_PROVIDER_SITE_OTHER): Payer: Medicare Other | Admitting: Internal Medicine

## 2014-06-17 ENCOUNTER — Encounter: Payer: Self-pay | Admitting: Internal Medicine

## 2014-06-17 VITALS — BP 138/90 | HR 104 | Temp 98.2°F | Wt 201.0 lb

## 2014-06-17 DIAGNOSIS — E538 Deficiency of other specified B group vitamins: Secondary | ICD-10-CM

## 2014-06-17 DIAGNOSIS — D518 Other vitamin B12 deficiency anemias: Secondary | ICD-10-CM

## 2014-06-17 DIAGNOSIS — M5440 Lumbago with sciatica, unspecified side: Secondary | ICD-10-CM

## 2014-06-17 DIAGNOSIS — F4323 Adjustment disorder with mixed anxiety and depressed mood: Secondary | ICD-10-CM

## 2014-06-17 MED ORDER — DIAZEPAM 5 MG PO TABS: 5.0000 mg | ORAL_TABLET | Freq: Two times a day (BID) | ORAL | Status: DC | PRN

## 2014-06-17 MED ORDER — HYDROCODONE-ACETAMINOPHEN 5-325 MG PO TABS: 1.0000 | ORAL_TABLET | ORAL | Status: DC | PRN

## 2014-06-17 NOTE — Progress Notes (Signed)
Pre visit review using our clinic review tool, if applicable. No additional management support is needed unless otherwise documented below in the visit note. 

## 2014-06-17 NOTE — Assessment & Plan Note (Signed)
Continue with current prescription therapy as reflected on the Med list.  

## 2014-06-17 NOTE — Assessment & Plan Note (Signed)
Continue with current prn prescription therapy as reflected on the Med list.  

## 2014-06-17 NOTE — Assessment & Plan Note (Signed)
11/15 son is being released

## 2014-06-17 NOTE — Progress Notes (Signed)
Subjective:    HPI  C/o stress w/son - worse; he is being released.Marland KitchenMarland KitchenTaking Valium prn  F/u R sinusitis x few months -  resolved. It was noted on dental xray by oral surgeon on 10/09/13. She was given Augmentin and had a diarrhea (stopped in 3 d). She was given Flagyl - not better   F/u R low back pain - she is seeing a chiropractor --moderate 3-4/10. F/u occ problems w/recalling words... F/u L neck pain - Dr Ellene Route  The patient is here to follow up on chronic B12 def , anxiety, headaches and chronic moderate OA/LBP/hip pain symptoms controlled with medicines. She gets no exercise.   Wt Readings from Last 3 Encounters:  06/17/14 201 lb (91.173 kg)  05/14/14 202 lb (91.627 kg)  03/12/14 198 lb (89.812 kg)   BP Readings from Last 3 Encounters:  06/17/14 138/90  05/14/14 124/74  03/12/14 140/82      Review of Systems  Constitutional: Positive for fatigue. Negative for chills, activity change, appetite change and unexpected weight change.  HENT: Negative for congestion, ear pain, facial swelling, hearing loss, mouth sores, nosebleeds, postnasal drip, rhinorrhea, sinus pressure, sneezing, sore throat, tinnitus and trouble swallowing.   Eyes: Negative for pain, discharge, redness, itching and visual disturbance.  Respiratory: Negative for chest tightness, wheezing and stridor.   Cardiovascular: Negative for leg swelling.  Gastrointestinal: Negative for diarrhea, constipation, blood in stool, abdominal distention, anal bleeding and rectal pain.  Genitourinary: Negative for dysuria, urgency, frequency, hematuria, flank pain, vaginal bleeding, vaginal discharge, difficulty urinating, genital sores and pelvic pain.  Musculoskeletal: Positive for arthralgias and gait problem. Negative for joint swelling, neck pain and neck stiffness.  Skin: Negative.  Negative for rash.  Neurological: Negative for tremors, syncope and speech difficulty.  Hematological: Negative for adenopathy. Does  not bruise/bleed easily.  Psychiatric/Behavioral: Positive for sleep disturbance. Negative for suicidal ideas, behavioral problems, dysphoric mood and decreased concentration. The patient is nervous/anxious.        Objective:   Physical Exam  Constitutional: She appears well-developed. No distress.  HENT:  Head: Normocephalic.  Right Ear: External ear normal.  Left Ear: External ear normal.  Nose: Nose normal.  Mouth/Throat: Oropharynx is clear and moist.  Eyes: Conjunctivae are normal. Pupils are equal, round, and reactive to light. Right eye exhibits no discharge. Left eye exhibits no discharge.  Neck: Normal range of motion. Neck supple. No JVD present. No tracheal deviation present. No thyromegaly present.  Cardiovascular: Normal rate, regular rhythm and normal heart sounds.   Pulmonary/Chest: No stridor. No respiratory distress. She has no wheezes.  Abdominal: Soft. Bowel sounds are normal. She exhibits no distension and no mass. There is no tenderness. There is no rebound and no guarding.  Musculoskeletal: She exhibits no edema or tenderness.  Lymphadenopathy:    She has no cervical adenopathy.  Neurological: She displays normal reflexes. No cranial nerve deficit. She exhibits normal muscle tone. Coordination normal.  Skin: No rash noted. No erythema.  Psychiatric: She has a normal mood and affect. Her behavior is normal. Judgment and thought content normal.  R cheek bone is not tender Anxious  Lab Results  Component Value Date   WBC 8.4 07/17/2013   HGB 14.8 07/17/2013   HCT 43.3 07/17/2013   PLT 299.0 07/17/2013   GLUCOSE 93 11/13/2013   CHOL 211* 06/17/2011   TRIG 96.0 06/17/2011   HDL 58.10 06/17/2011   LDLDIRECT 161.3 06/17/2011   ALT 21 06/17/2011   AST 20 06/17/2011  NA 137 11/13/2013   K 3.8 11/13/2013   CL 101 11/13/2013   CREATININE 0.7 11/13/2013   BUN 13 11/13/2013   CO2 29 11/13/2013   TSH 2.58 06/17/2011   INR 1.76* 08/29/2009           Assessment & Plan:

## 2014-08-04 ENCOUNTER — Telehealth: Payer: Self-pay | Admitting: *Deleted

## 2014-08-04 NOTE — Telephone Encounter (Signed)
[facility] - Client [facility] Patient Name: [name] Gender: Female DOB: May 04, 1941 Age: 74 Y 9 M 8 D Return Phone Number: [phone] (Primary) Address: City/State/Zip: [place] [address] Client [facility] - Client Client Site [facility] - Night Physician Plotnikov, Alex Contact Type Call Call Type Triage / Clinical Caller Name Irianna Gilday Relationship To Patient Self Return Phone Number [phone] (Primary) Chief Complaint Runny or Toston states a runny nose, headache PreDisposition Home Care Nurse Assessment Nurse: Verlin Fester, RN, Stanton Kidney Date/Time Eilene Ghazi Time): 08/04/2014 7:19:13 AM Confirm and document reason for call. If symptomatic, describe symptoms. ---Patient states she has a had headache and runny nose, stuffy nose, and cough occasionally. Has the patient traveled out of the country within the last 30 days? ---Not Applicable Does the patient require triage? ---Yes Related visit to physician within the last 2 weeks? ---No Does the PT have any chronic conditions? (i.e. diabetes, asthma, etc.) ---No Guidelines Guideline Title Affirmed Question Affirmed Notes Nurse Date/Time (Eastern Time) Common Cold [1] Sinus congestion (pressure, fullness) AND [2] present > 10 days Atilano Ina 08/04/2014 7:22:45 AM Disp. Time Eilene Ghazi Time) Disposition Final User 08/04/2014 7:28:08 AM See PCP When Office is Open (within 3 days) Yes Noe, RN, Nemiah Commander Understands: Yes Disagree/Comply: Comply Care Advice Given Per Guideline SEE PCP WITHIN 3 DAYS: You need to be examined within 2 or 3 days. Call your doctor during regular office hours and make an appointment. (Note: if office will be open tomorrow, tell caller to call then, not in 3 days). FOR A STUFFY NOSE - USE NASAL PLEASE NOTE: All timestamps contained within this report are represented as  [place] Standard Time. CONFIDENTIALTY NOTICE: This fax transmission is intended only for the addressee. It contains information that is legally privileged, confidential or otherwise protected from use or disclosure. If you are not the intended recipient, you are strictly prohibited from reviewing, disclosing, copying using or disseminating any of this information or taking any action in reliance on or regarding this information. If you have received this fax in error, please notify us immediately by telephone so that we can arrange for its return to [place]. Phone: [phone], Toll-Free: [phone], Fax: [phone] Page: 2 of 2 Call Id: [id number] Care Advice Given Per Guideline WASHES: * Introduction: Saline (salt water) nasal irrigation (nasal wash) is an effective and simple home remedy for treating stuffy nose and sinus congestion. The nose can be irrigated by pouring, spraying, or squirting salt water into the nose and then letting it run back out. * How it Helps: The salt water rinses out excess mucus, washes out any irritants (dust, allergens) that might be present, and moistens the nasal cavity. * Methods: There are several ways to perform nasal irrigation. You can use a saline nasal spray bottle (available over-the-counter), a rubber ear syringe, a medical syringe without the needle, or a NETI POT. * STEP 1: Lean over a sink. * STEP 2: Gently squirt or spray warm salt water into one of your nostrils. STEP-BY-STEP INSTRUCTIONS: * STEP 3: Some of the water may run into the back of your throat. Spit this out. If you swallow the salt water it will not hurt you. * STEP 4: Blow your nose to clean out the water and mucus. * STEP 5: Repeat steps 1-4 for the other nostril. You can do this a couple times a day if it seems to  help you. HOW TO MAKE SALINE (SALT WATER) NASAL WASH: * You can make your own saline nasal wash. * Add 1/2 tsp of table salt to 1 cup (8 oz; 240 ml) of warm water. * You should use  sterile, distilled, or previously boiled water for nasal irrigation. FOR A RUNNY NOSE - BLOW YOUR NOSE: * Nasal mucus and discharge help wash viruses and bacteria out of the nose and sinuses. * Blowing your nose helps clean out your nose. Use a handkerchief or a paper tissue. * If the skin around your nostrils gets irritated, apply a tiny amount of petroleum ointment to the nasal openings once or twice a day. MEDICINES FOR STUFFY OR RUNNY NOSE: * Most cold medicines that are available over-the-counter (OTC) are not helpful. * Antihistamines: Are only helpful if you also have nasal allergies. * If you have a very runny nose and you really think you need a medicine, you can try using a nasal decongestant for a couple days. NASAL DECONGESTANTS FOR A VERY STUFFY NOSE: * If you have a very stuffy nose, nasal decongestant medicines can shrink the swollen nasal mucosa and allow for easier breathing. If you have a very runny nose, these medicines can reduce the amount of drainage. They may be taken as pills by mouth or as a nasal spray. * Most people do NOT need to use these medicines. If your nose feels blocked, you should try using nasal washes first. * Read the package instructions on all medicines that you take. HUMIDIFIER: If the air in your home is dry, use a humidifier. TREATMENT FOR ASSOCIATED SYMPTOMS OF COLDS: * For muscle aches, headaches, or moderate fever (over 101 degrees F) (38.9 C) use acetaminophen every 4 hours. * Sore throat: throat lozenges, hard candy or warm chicken broth. * Cough: use cough drops. * Hydrate: drink extra liquids. CALL BACK IF: * Fever over 104 F * You have difficulty breathing * You become worse. CARE ADVICE given per Colds (Adult) guideline. After Care Instructions Given Call Event Type User Date / Time Description

## 2014-10-20 ENCOUNTER — Other Ambulatory Visit (INDEPENDENT_AMBULATORY_CARE_PROVIDER_SITE_OTHER): Payer: Medicare Other

## 2014-10-20 ENCOUNTER — Encounter: Payer: Self-pay | Admitting: Internal Medicine

## 2014-10-20 ENCOUNTER — Ambulatory Visit (INDEPENDENT_AMBULATORY_CARE_PROVIDER_SITE_OTHER): Payer: Medicare Other | Admitting: Internal Medicine

## 2014-10-20 VITALS — BP 152/82 | HR 72 | Wt 195.0 lb

## 2014-10-20 DIAGNOSIS — R413 Other amnesia: Secondary | ICD-10-CM

## 2014-10-20 DIAGNOSIS — E538 Deficiency of other specified B group vitamins: Secondary | ICD-10-CM

## 2014-10-20 DIAGNOSIS — R1084 Generalized abdominal pain: Secondary | ICD-10-CM

## 2014-10-20 DIAGNOSIS — M5440 Lumbago with sciatica, unspecified side: Secondary | ICD-10-CM

## 2014-10-20 DIAGNOSIS — D518 Other vitamin B12 deficiency anemias: Secondary | ICD-10-CM | POA: Diagnosis not present

## 2014-10-20 DIAGNOSIS — R109 Unspecified abdominal pain: Secondary | ICD-10-CM | POA: Insufficient documentation

## 2014-10-20 LAB — VITAMIN B12: Vitamin B-12: 377 pg/mL (ref 211–911)

## 2014-10-20 LAB — LIPID PANEL
Cholesterol: 221 mg/dL — ABNORMAL HIGH (ref 0–200)
HDL: 54 mg/dL (ref >39.00)
LDL Cholesterol: 139 mg/dL — ABNORMAL HIGH (ref 0–99)
NonHDL: 167
Total CHOL/HDL Ratio: 4
Triglycerides: 140 mg/dL (ref 0.0–149.0)
VLDL: 28 mg/dL (ref 0.0–40.0)

## 2014-10-20 LAB — URINALYSIS
Hgb urine dipstick: NEGATIVE
Leukocytes, UA: NEGATIVE
Nitrite: NEGATIVE
Specific Gravity, Urine: >=1.03 — AB (ref 1.000–1.030)
Total Protein, Urine: NEGATIVE
Urine Glucose: NEGATIVE
Urobilinogen, UA: 0.2 (ref 0.0–1.0)
pH: 5.5 (ref 5.0–8.0)

## 2014-10-20 LAB — BASIC METABOLIC PANEL WITH GFR
BUN: 18 mg/dL (ref 6–23)
CO2: 27 meq/L (ref 19–32)
Calcium: 9.3 mg/dL (ref 8.4–10.5)
Chloride: 105 meq/L (ref 96–112)
Creatinine, Ser: 0.68 mg/dL (ref 0.40–1.20)
GFR: 89.66 mL/min
Glucose, Bld: 94 mg/dL (ref 70–99)
Potassium: 4.2 meq/L (ref 3.5–5.1)
Sodium: 138 meq/L (ref 135–145)

## 2014-10-20 LAB — CBC WITH DIFFERENTIAL/PLATELET
Basophils Absolute: 0.1 10*3/uL (ref 0.0–0.1)
Basophils Relative: 0.7 % (ref 0.0–3.0)
Eosinophils Absolute: 0.2 10*3/uL (ref 0.0–0.7)
Eosinophils Relative: 2.1 % (ref 0.0–5.0)
HCT: 43.7 % (ref 36.0–46.0)
Hemoglobin: 14.9 g/dL (ref 12.0–15.0)
Lymphocytes Relative: 25.9 % (ref 12.0–46.0)
Lymphs Abs: 2.1 10*3/uL (ref 0.7–4.0)
MCHC: 34 g/dL (ref 30.0–36.0)
MCV: 94.4 fl (ref 78.0–100.0)
Monocytes Absolute: 0.9 10*3/uL (ref 0.1–1.0)
Monocytes Relative: 11.5 % (ref 3.0–12.0)
Neutro Abs: 4.9 10*3/uL (ref 1.4–7.7)
Neutrophils Relative %: 59.8 % (ref 43.0–77.0)
Platelets: 310 10*3/uL (ref 150.0–400.0)
RBC: 4.63 Mil/uL (ref 3.87–5.11)
RDW: 13.6 % (ref 11.5–15.5)
WBC: 8.1 10*3/uL (ref 4.0–10.5)

## 2014-10-20 LAB — HEPATIC FUNCTION PANEL
ALT: 18 U/L (ref 0–35)
AST: 17 U/L (ref 0–37)
Albumin: 4.1 g/dL (ref 3.5–5.2)
Alkaline Phosphatase: 75 U/L (ref 39–117)
Bilirubin, Direct: 0.1 mg/dL (ref 0.0–0.3)
Total Bilirubin: 0.4 mg/dL (ref 0.2–1.2)
Total Protein: 7.3 g/dL (ref 6.0–8.3)

## 2014-10-20 LAB — TSH: TSH: 2.1 u[IU]/mL (ref 0.35–4.50)

## 2014-10-20 MED ORDER — PHOSPHATIDYLSERINE-DHA-EPA 100-19.5-6.5 MG PO CAPS: 1.0000 | ORAL_CAPSULE | Freq: Every day | ORAL | Status: DC

## 2014-10-20 NOTE — Progress Notes (Signed)
Pre visit review using our clinic review tool, if applicable. No additional management support is needed unless otherwise documented below in the visit note. 

## 2014-10-20 NOTE — Assessment & Plan Note (Signed)
B 12

## 2014-10-20 NOTE — Assessment & Plan Note (Signed)
Norco prn  Potential benefits of a long term steroid  use as well as potential risks  and complications were explained to the patient and were aknowledged.

## 2014-10-20 NOTE — Assessment & Plan Note (Signed)
Vayacog po Other options discussed

## 2014-10-20 NOTE — Assessment & Plan Note (Signed)
Labs [place] or CT if re-occus

## 2014-10-20 NOTE — Progress Notes (Signed)
Subjective:    HPI  C/o stress w/son - worse; he is released after 8 years; he moved in with [place].Marland KitchenMarland KitchenTaking Valium prn. C/o memory issues, anxiety. Pt lost wt.   F/u R low back pain - she is seeing a chiropractor --moderate 3-4/10. F/u occ problems w/recalling words... F/u L neck pain - Dr Ellene Route  The patient is here to follow up on chronic B12 def , anxiety, headaches and chronic moderate OA/LBP/hip pain symptoms controlled with medicines. She gets no exercise.   Wt Readings from Last 3 Encounters:  10/20/14 195 lb (88.451 kg)  06/17/14 201 lb (91.173 kg)  05/14/14 202 lb (91.627 kg)   BP Readings from Last 3 Encounters:  10/20/14 152/82  06/17/14 138/90  05/14/14 124/74      Review of Systems  Constitutional: Positive for fatigue. Negative for chills, activity change, appetite change and unexpected weight change.  HENT: Negative for congestion, ear pain, facial swelling, hearing loss, mouth sores, nosebleeds, postnasal drip, rhinorrhea, sinus pressure, sneezing, sore throat, tinnitus and trouble swallowing.   Eyes: Negative for pain, discharge, redness, itching and visual disturbance.  Respiratory: Negative for chest tightness, wheezing and stridor.   Cardiovascular: Negative for leg swelling.  Gastrointestinal: Negative for diarrhea, constipation, blood in stool, abdominal distention, anal bleeding and rectal pain.  Genitourinary: Negative for dysuria, urgency, frequency, hematuria, flank pain, vaginal bleeding, vaginal discharge, difficulty urinating, genital sores and pelvic pain.  Musculoskeletal: Positive for arthralgias and gait problem. Negative for joint swelling, neck pain and neck stiffness.  Skin: Negative.  Negative for rash.  Neurological: Negative for tremors, syncope and speech difficulty.  Hematological: Negative for adenopathy. Does not bruise/bleed easily.  Psychiatric/Behavioral: Positive for sleep disturbance. Negative for suicidal ideas, behavioral  problems, dysphoric mood and decreased concentration. The patient is nervous/anxious.        Objective:   Physical Exam  Constitutional: She appears well-developed. No distress.  HENT:  Head: Normocephalic.  Right Ear: External ear normal.  Left Ear: External ear normal.  Nose: Nose normal.  Mouth/Throat: Oropharynx is clear and moist.  Eyes: Conjunctivae are normal. Pupils are equal, round, and reactive to light. Right eye exhibits no discharge. Left eye exhibits no discharge.  Neck: Normal range of motion. Neck supple. No JVD present. No tracheal deviation present. No thyromegaly present.  Cardiovascular: Normal rate, regular rhythm and normal heart sounds.   Pulmonary/Chest: No stridor. No respiratory distress. She has no wheezes.  Abdominal: Soft. Bowel sounds are normal. She exhibits no distension and no mass. There is no tenderness. There is no rebound and no guarding.  Musculoskeletal: She exhibits no edema or tenderness.  Lymphadenopathy:    She has no cervical adenopathy.  Neurological: She displays normal reflexes. No cranial nerve deficit. She exhibits normal muscle tone. Coordination normal.  Skin: No rash noted. No erythema.  Psychiatric: She has a normal mood and affect. Her behavior is normal. Judgment and thought content normal.   Anxious  Lab Results  Component Value Date   WBC 8.4 07/17/2013   HGB 14.8 07/17/2013   HCT 43.3 07/17/2013   PLT 299.0 07/17/2013   GLUCOSE 93 11/13/2013   CHOL 211* 06/17/2011   TRIG 96.0 06/17/2011   HDL 58.10 06/17/2011   LDLDIRECT 161.3 06/17/2011   ALT 21 06/17/2011   AST 20 06/17/2011   NA 137 11/13/2013   K 3.8 11/13/2013   CL 101 11/13/2013   CREATININE 0.7 11/13/2013   BUN 13 11/13/2013   CO2 29 11/13/2013  TSH 2.58 06/17/2011   INR 1.76* 08/29/2009          Assessment & Plan:

## 2014-10-20 NOTE — Assessment & Plan Note (Signed)
Labs B12 po

## 2014-11-30 ENCOUNTER — Encounter: Payer: Self-pay | Admitting: Internal Medicine

## 2014-12-01 ENCOUNTER — Other Ambulatory Visit: Payer: Self-pay | Admitting: Internal Medicine

## 2014-12-01 MED ORDER — ZOLPIDEM TARTRATE 10 MG PO TABS: 5.0000 mg | ORAL_TABLET | Freq: Every evening | ORAL | Status: DC | PRN

## 2015-01-23 ENCOUNTER — Other Ambulatory Visit: Payer: Self-pay | Admitting: Gynecology

## 2015-01-23 MED ORDER — NONFORMULARY OR COMPOUNDED ITEM: Status: DC

## 2015-01-26 ENCOUNTER — Other Ambulatory Visit: Payer: Self-pay

## 2015-02-09 ENCOUNTER — Ambulatory Visit (INDEPENDENT_AMBULATORY_CARE_PROVIDER_SITE_OTHER): Payer: Medicare Other | Admitting: Internal Medicine

## 2015-02-09 ENCOUNTER — Encounter: Payer: Self-pay | Admitting: Internal Medicine

## 2015-02-09 VITALS — BP 150/80 | HR 74 | Wt 198.0 lb

## 2015-02-09 DIAGNOSIS — M5440 Lumbago with sciatica, unspecified side: Secondary | ICD-10-CM | POA: Diagnosis not present

## 2015-02-09 DIAGNOSIS — M170 Bilateral primary osteoarthritis of knee: Secondary | ICD-10-CM

## 2015-02-09 DIAGNOSIS — E538 Deficiency of other specified B group vitamins: Secondary | ICD-10-CM | POA: Diagnosis not present

## 2015-02-09 DIAGNOSIS — F411 Generalized anxiety disorder: Secondary | ICD-10-CM | POA: Diagnosis not present

## 2015-02-09 DIAGNOSIS — K649 Unspecified hemorrhoids: Secondary | ICD-10-CM | POA: Insufficient documentation

## 2015-02-09 DIAGNOSIS — G4700 Insomnia, unspecified: Secondary | ICD-10-CM

## 2015-02-09 DIAGNOSIS — K648 Other hemorrhoids: Secondary | ICD-10-CM

## 2015-02-09 NOTE — Progress Notes (Signed)
Pre visit review using our clinic review tool, if applicable. No additional management support is needed unless otherwise documented below in the visit note. 

## 2015-02-09 NOTE — Assessment & Plan Note (Signed)
On B12 

## 2015-02-09 NOTE — Patient Instructions (Signed)
Valerian root 

## 2015-02-09 NOTE — Assessment & Plan Note (Signed)
Diazepam prn  Potential benefits of a long term benzodiazepines  use as well as potential risks  and complications were explained to the patient and were aknowledged. 

## 2015-02-09 NOTE — Assessment & Plan Note (Signed)
Norco prn  Potential benefits of a long term opioids use as well as potential risks (i.e. addiction risk, apnea etc) and complications (i.e. Somnolence, constipation and others) were explained to the patient and were aknowledged. 

## 2015-02-09 NOTE — Assessment & Plan Note (Signed)
Colonoscopy 2008: MODERATELY SEVERE DIVERTICULOSIS OF THE LEFT COLON, NO POLYPS. PATIENT INSTRUCTED TO GET ROUTINE COLONOSCOPY EVERY 10 YEARS.

## 2015-02-09 NOTE — Progress Notes (Signed)
Subjective:  Patient ID: [name], female    DOB: 21-Mar-1941  Age: 74 y.o. MRN: [id number]  CC: No chief complaint on file.   HPI [name] presents for anxiety, hip pain, LBP, memory issues  Outpatient Prescriptions Prior to Visit  Medication Sig Dispense Refill  . amoxicillin (AMOXIL) 500 MG capsule Take 4 capsules (2,000 mg total) by mouth daily. For dental visits. 20 capsule 3  . Ascorbic Acid (VITAMIN C PO) Take by mouth daily.    . Biotin 1000 MCG tablet Take 2,000 mcg by mouth 3 (three) times daily.    . Cholecalciferol (VITAMIN D3) 1000 UNITS tablet Take 2,000 Units by mouth daily.      . clobetasol cream (TEMOVATE) 3.82 % Apply 1 application topically 2 (two) times a week.    . diazepam (VALIUM) 5 MG tablet Take 1 tablet (5 mg total) by mouth 2 (two) times daily as needed for anxiety or muscle spasms. 60 tablet 2  . HYDROcodone-acetaminophen (NORCO) 5-325 MG per tablet Take 1 tablet by mouth every 4 (four) hours as needed. 30 tablet 0  . NONFORMULARY OR COMPOUNDED ITEM Estradiol HRT (72ml) 0.02% (0.2mg /ml) cream S: insert 1 ml. (1 syringe) into the vaginal at bedtime twice a week as directed. 24 each 4  . vitamin B-12 (CYANOCOBALAMIN) 1000 MCG tablet Take 1,000 mcg by mouth daily.      Marland Kitchen zolpidem (AMBIEN) 10 MG tablet Take 0.5-1 tablets (5-10 mg total) by mouth at bedtime as needed for sleep. 30 tablet 2  . celecoxib (CELEBREX) 200 MG capsule Take 200 mg by mouth 2 (two) times daily as needed for pain.    . Phosphatidylserine-DHA-EPA (VAYACOG) 100-19.5-6.5 MG CAPS Take 1 capsule by mouth daily. (Patient not taking: Reported on 02/09/2015) 30 capsule 11   No facility-administered medications prior to visit.    ROS Review of Systems  Constitutional: Positive for unexpected weight change. Negative for chills, activity change, appetite change and fatigue.  HENT: Negative for congestion, mouth sores and sinus pressure.   Eyes: Negative for visual disturbance.    Respiratory: Negative for cough and chest tightness.   Gastrointestinal: Negative for nausea and abdominal pain.  Genitourinary: Negative for frequency, difficulty urinating and vaginal pain.  Musculoskeletal: Positive for back pain. Negative for gait problem.  Skin: Negative for pallor and rash.  Neurological: Negative for dizziness, tremors, weakness, numbness and headaches.  Psychiatric/Behavioral: Negative for confusion and sleep disturbance. The patient is nervous/anxious.     Objective:  BP 150/80 mmHg  Pulse 74  Wt 198 lb (89.812 kg)  SpO2 97%  BP Readings from Last 3 Encounters:  02/09/15 150/80  10/20/14 152/82  06/17/14 138/90    Wt Readings from Last 3 Encounters:  02/09/15 198 lb (89.812 kg)  10/20/14 195 lb (88.451 kg)  06/17/14 201 lb (91.173 kg)    Physical Exam  Constitutional: She appears well-developed. No distress.  Obese  HENT:  Head: Normocephalic.  Right Ear: External ear normal.  Left Ear: External ear normal.  Nose: Nose normal.  Mouth/Throat: Oropharynx is clear and moist.  Eyes: Conjunctivae are normal. Pupils are equal, round, and reactive to light. Right eye exhibits no discharge. Left eye exhibits no discharge.  Neck: Normal range of motion. Neck supple. No JVD present. No tracheal deviation present. No thyromegaly present.  Cardiovascular: Normal rate, regular rhythm and normal heart sounds.   Pulmonary/Chest: No stridor. No respiratory distress. She has no wheezes.  Abdominal: Soft. Bowel sounds are normal.  She exhibits no distension and no mass. There is no tenderness. There is no rebound and no guarding.  Musculoskeletal: She exhibits tenderness. She exhibits no edema.  Lymphadenopathy:    She has no cervical adenopathy.  Neurological: She displays normal reflexes. No cranial nerve deficit. She exhibits normal muscle tone. Coordination normal.  Skin: No rash noted. No erythema.  Psychiatric: She has a normal mood and affect. Her  behavior is normal. Judgment and thought content normal.  LS tender  Lab Results  Component Value Date   WBC 8.1 10/20/2014   HGB 14.9 10/20/2014   HCT 43.7 10/20/2014   PLT 310.0 10/20/2014   GLUCOSE 94 10/20/2014   CHOL 221* 10/20/2014   TRIG 140.0 10/20/2014   HDL 54.00 10/20/2014   LDLDIRECT 161.3 06/17/2011   LDLCALC 139* 10/20/2014   ALT 18 10/20/2014   AST 17 10/20/2014   NA 138 10/20/2014   K 4.2 10/20/2014   CL 105 10/20/2014   CREATININE 0.68 10/20/2014   BUN 18 10/20/2014   CO2 27 10/20/2014   TSH 2.10 10/20/2014   INR 1.76* 08/29/2009    Ct Angio Chest W/cm &/or Wo Cm  07/18/2013   CLINICAL DATA:  Elevated D-dimer.  Pulmonary embolism.  Chest pain.  EXAM: CT ANGIOGRAPHY CHEST WITH CONTRAST  TECHNIQUE: Multidetector CT imaging of the chest was performed using the standard protocol during bolus administration of intravenous contrast. Multiplanar CT image reconstructions including MIPs were obtained to evaluate the vascular anatomy.  CONTRAST:  173mL OMNIPAQUE IOHEXOL 350 MG/ML SOLN  COMPARISON:  07/17/2013 chest radiograph.  FINDINGS: Study is technically adequate for evaluation of pulmonary embolus and no pulmonary embolism is present. Aorta and branch vessels demonstrate mild atherosclerosis. No dissection or acute aortic abnormality. Small hiatal hernia incidentally noted. Hypervascular lesion is present in the left hepatic lobe measuring 5 mm (segment 2), likely representing a flash fill hemangioma. Tiny hepatic cyst is present in segment 4A. No pericardial or pleural effusion.  Lung windows demonstrate dependent atelectasis. No airspace disease. Scattered areas of ground-glass attenuation are present with mosaic attenuation which may be due to air trapping based on the atelectasis. This can also be associated with small airway/ small vessel disease. There are no aggressive osseous lesions. Lower cervical ACDF partially visualized.  Review of the MIP images confirms the  above findings.  IMPRESSION: 1. Negative for pulmonary embolus or acute abnormality. 2. Lungs demonstrate mosaic attenuation compatible with air trapping. This can be associated with small airway/small vessel disease. 3. Tiny liver cyst and flash fill hemangioma in the left hepatic lobe.   Electronically Signed   By: Dereck Ligas M.D.   On: 07/18/2013 11:11   Colonoscopy 2008: MODERATELY SEVERE DIVERTICULOSIS OF THE LEFT COLON, NO POLYPS. PATIENT INSTRUCTED TO GET ROUTINE COLONOSCOPY EVERY 10 YEARS.  Assessment & Plan:   Diagnoses and all orders for this visit:  B12 deficiency  Midline low back pain with sciatica, sciatica laterality unspecified  Anxiety state  Insomnia  Primary osteoarthritis of both knees Orders: -     Ambulatory referral to Sports Medicine  Other hemorrhoids  I am having Ms. Lasalle maintain her vitamin B-12, cholecalciferol, Biotin, celecoxib, clobetasol cream, Ascorbic Acid (VITAMIN C PO), amoxicillin, HYDROcodone-acetaminophen, diazepam, Phosphatidylserine-DHA-EPA, zolpidem, and NONFORMULARY OR COMPOUNDED ITEM.  No orders of the defined types were placed in this encounter.     Follow-up: Return in about 4 months (around 06/12/2015) for a follow-up visit.  [name], MD

## 2015-02-09 NOTE — Assessment & Plan Note (Signed)
Dr Tamala Julian - will consult

## 2015-02-09 NOTE — Assessment & Plan Note (Signed)
Zolpidem prn  Potential benefits of a long term benzodiazepines  use as well as potential risks  and complications were explained to the patient and were aknowledged. 

## 2015-02-25 ENCOUNTER — Ambulatory Visit (INDEPENDENT_AMBULATORY_CARE_PROVIDER_SITE_OTHER)
Admission: RE | Admit: 2015-02-25 | Discharge: 2015-02-25 | Disposition: A | Discharge status: Home / Self Care | Payer: Medicare Other | Source: Ambulatory Visit | Attending: Family Medicine | Admitting: Family Medicine

## 2015-02-25 ENCOUNTER — Ambulatory Visit (INDEPENDENT_AMBULATORY_CARE_PROVIDER_SITE_OTHER): Payer: Medicare Other | Admitting: Family Medicine

## 2015-02-25 ENCOUNTER — Encounter: Payer: Self-pay | Admitting: Family Medicine

## 2015-02-25 VITALS — BP 122/76 | HR 68 | Ht 64.0 in | Wt 199.0 lb

## 2015-02-25 DIAGNOSIS — M25561 Pain in right knee: Secondary | ICD-10-CM

## 2015-02-25 DIAGNOSIS — M170 Bilateral primary osteoarthritis of knee: Secondary | ICD-10-CM | POA: Diagnosis not present

## 2015-02-25 DIAGNOSIS — M25562 Pain in left knee: Secondary | ICD-10-CM | POA: Diagnosis not present

## 2015-02-25 NOTE — Progress Notes (Signed)
Pre visit review using our clinic review tool, if applicable. No additional management support is needed unless otherwise documented below in the visit note. 

## 2015-02-25 NOTE — Progress Notes (Signed)
[name] [address] [place], [name] [address] Phone: [phone] Subjective:    I'm seeing this patient by the request  of:  [name], MD   CC: Bilateral knee pain  [id number] [name] is a 74 y.o. female coming in with complaint of lateral knee pain. Patient has been told before she does have arthritis. Patient does have bilateral hip replacements and does have known degenerative disc disease of her back. Patient has noticed over the course of time she's been having worsening worsening pain. Patient states that going up and downstairs has been difficult. Not affecting daily activities along she stays in a flat surface. Patient denies any nighttime awakening her any radiation of pain. Denies any instability but continues to give her discomfort. Patient rates the severity of pain is 4 out of 10. Has not tried any over-the-counter medications a regular basis and has not been icing. Has not notice any association with shoes or any specific activity.  Past Medical History  Diagnosis Date  . Anxiety   . Hyperlipemia   . LBP (low back pain)   . Osteoarthritis   . Vitamin D deficiency   . Vitamin B deficiency   . Degenerative disk disease   . Diverticulosis   . PONV (postoperative nausea and vomiting)   . Hypertension     not treated  . Chronic kidney disease 1967    hx kidney infection   Past Surgical History  Procedure Laterality Date  . 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 Cervical fusion  1999 & 2009    C3-4 [facility]  . Cataract surgery      left  . Joint replacement    . Eye surgery Left 2012    cataract  . Spine surgery  1999&2009  . Tonsillectomy    . Appendectomy    . Breast surgery  2000    breast reduction... BREAST BIOPSY ON OCTOBER 2014  . Breast biopsy Left 06/03/2013    Procedure: BREAST BIOPSY WITH NEEDLE LOCALIZATION;  Surgeon: [name], MD;  Location: [facility];  Service: General;   Laterality: Left;  . Abdominal hysterectomy  2005    SUPRACERVICAL HYSTERECTOMY   History  Substance Use Topics  . Smoking status: Former Smoker -- 3.00 packs/day for 20 years    Quit date: 04/19/1985  . Smokeless tobacco: Never Used  . Alcohol Use: Yes     Comment: occasionally    Allergies  Allergen Reactions  . Augmentin [Amoxicillin-Pot Clavulanate]     diarrhea  . Cymbalta [Duloxetine Hcl]     diarrhea  . Ezetimibe-Simvastatin   . Phentermine Hcl    Family History  Problem Relation Age of Onset  . Arthritis Mother 56    polymyositis  . Hypertension Father   . Heart disease Father   . Hypertension Brother   . Cancer Brother     Oral        Past medical history, social, surgical and family history all reviewed in electronic medical record.   Review of Systems: No headache, visual changes, nausea, vomiting, diarrhea, constipation, dizziness, abdominal pain, skin rash, fevers, chills, night sweats, weight loss, swollen lymph nodes, body aches, joint swelling, muscle aches, chest pain, shortness of breath, mood changes.   Objective Blood pressure 122/76, pulse 68, height 5\' 4"  (1.626 m), weight 199 lb (90.266 kg), SpO2 96 %.  General: No apparent distress alert and oriented x3 mood  and affect normal, dressed appropriately.  HEENT: Pupils equal, extraocular movements intact  Respiratory: Patient's speak in full sentences and does not appear short of breath  Cardiovascular: No lower extremity edema, non tender, no erythema  Skin: Warm dry intact with no signs of infection or rash on extremities or on axial skeleton.  Abdomen: Soft nontender  Neuro: Cranial nerves II through XII are intact, neurovascularly intact in all extremities with 2+ DTRs and 2+ pulses.  Lymph: No lymphadenopathy of posterior or anterior cervical chain or axillae bilaterally.  Gait normal with good balance and coordination.  MSK:  Non tender with full range of motion and good stability and  symmetric strength and tone of shoulders, elbows, wrist, hip, and ankles bilaterally.  Knee: Bilateral Valgus deformity noted Tender to palpation over the medial joint line bilaterally ROM full in flexion and extension and lower leg rotation. Ligaments with solid consistent endpoints including ACL, PCL, LCL, MCL. Negative Mcmurray's, Apley's, and Thessalonian tests. Mild painful patellar compression. Patellar glide with moderate crepitus. Patellar and quadriceps tendons unremarkable. Hamstring and quadriceps strength is normal.   [id number]; 15 minutes spent for Therapeutic exercises as stated in above notes.  This included exercises focusing on stretching, strengthening, with significant focus on eccentric aspects.  Vastus medialis oblique strengthening as well as hip abductor strengthening given. In flexion and extension exercises. Proper technique shown and discussed handout in great detail with ATC.  All questions were discussed and answered.      Impression and Recommendations:     This case required medical decision making of moderate complexity.

## 2015-02-25 NOTE — Assessment & Plan Note (Signed)
Based on patient's severity of valgus deformity of the knees I do think the patient is to have severe arthritis. X-rays ordered today. We discussed the possibility of bracing which patient declined. Home exercises given. We discussed icing regimen and over-the-counter natural supplementation. Patient given a topical anti-inflammatory to try. Patient will come back in 3-4 weeks. If continuing to have discomfort I would consider bracing as well as injections.

## 2015-02-25 NOTE — Patient Instructions (Signed)
Good to see you.  Ice 20 minutes 2 times daily. Usually after activity and before bed. Exercises 3 times a week.  Good shoes with rigid bottom.  [name], Merrell or New balance greater then 700 pennsaid pinkie amount topically 2 times daily as needed.  Take tylenol 650 mg three times a day is the best evidence based medicine we have for arthritis.  Vitamin D 2000 IU daily Fish oil 2 grams daily.  Tumeric 500mg  twice daily.  Cortisone injections are an option if these interventions do not seem to make a difference or need more relief.  If cortisone injections do not help, there are different types of shots that may help but they take longer to take effect.  We can discuss this at follow up.  It's important that you continue to stay active. Controlling your weight is important.  Water aerobics and cycling with low resistance are the best two types of exercise for arthritis. Come back and see me in 4 weeks.

## 2015-03-09 ENCOUNTER — Other Ambulatory Visit: Payer: Self-pay | Admitting: Gynecology

## 2015-03-25 ENCOUNTER — Ambulatory Visit: Payer: Medicare Other | Admitting: Internal Medicine

## 2015-03-25 ENCOUNTER — Encounter: Payer: Self-pay | Admitting: Family Medicine

## 2015-03-25 ENCOUNTER — Ambulatory Visit (INDEPENDENT_AMBULATORY_CARE_PROVIDER_SITE_OTHER): Payer: Medicare Other | Admitting: Family Medicine

## 2015-03-25 VITALS — BP 134/80 | HR 72 | Wt 199.0 lb

## 2015-03-25 DIAGNOSIS — M170 Bilateral primary osteoarthritis of knee: Secondary | ICD-10-CM | POA: Diagnosis not present

## 2015-03-25 NOTE — Assessment & Plan Note (Signed)
Patient given injection and tolerated the procedure well. We discussed icing regimen and home exercises. We discussed possible formal physical therapy which patient declined. Patient will come back and see me again in 4 weeks. If continued have pain she could be a candidate for viscous supplementation.

## 2015-03-25 NOTE — Patient Instructions (Signed)
Thanks for the book recommendation Ice in 6 hours Continue everything else you are doing.  This should calm it down See me again in 4 weeks.

## 2015-03-25 NOTE — Progress Notes (Signed)
[name] [address] [place], [place] [address] Phone: [phone] Subjective:    I'm seeing this patient by the request  of:  [name], MD   CC: Bilateral knee pain follow up  [id number] [name] is a 74 y.o. female coming in with complaint of bilateral knee pain.patient was found to have moderate osteophytic changes of the knees bilaterally on x-ray after last exam. Patient describes the pain as more of a dull aching sensation still the medial aspect. States that it's only on the right side left side seems to be improving somewhat. Patient has not been able to take the vitamins on a regular basis and does forget to do the exercises. Patient states that it isn't getting worse but no significant improvement.  Past Medical History  Diagnosis Date  . Anxiety   . Hyperlipemia   . LBP (low back pain)   . Osteoarthritis   . Vitamin D deficiency   . Vitamin B deficiency   . Degenerative disk disease   . Diverticulosis   . PONV (postoperative nausea and vomiting)   . Hypertension     not treated  . Chronic kidney disease 1967    hx kidney infection   Past Surgical History  Procedure Laterality Date  . 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 Cervical fusion  1999 & 2009    C3-4 Elsner  . Cataract surgery      left  . Joint replacement    . Eye surgery Left 2012    cataract  . Spine surgery  1999&2009  . Tonsillectomy    . Appendectomy    . Breast surgery  2000    breast reduction... BREAST BIOPSY ON OCTOBER 2014  . Breast biopsy Left 06/03/2013    Procedure: BREAST BIOPSY WITH NEEDLE LOCALIZATION;  Surgeon: [name], MD;  Location: [facility];  Service: General;  Laterality: Left;  . Abdominal hysterectomy  2005    SUPRACERVICAL HYSTERECTOMY   Social History  Substance Use Topics  . Smoking status: Former Smoker -- 3.00 packs/day for 20 years    Quit date: 04/19/1985  . Smokeless tobacco: Never Used   . Alcohol Use: Yes     Comment: occasionally    Allergies  Allergen Reactions  . Augmentin [Amoxicillin-Pot Clavulanate]     diarrhea  . Cymbalta [Duloxetine Hcl]     diarrhea  . Ezetimibe-Simvastatin   . Phentermine Hcl    Family History  Problem Relation Age of Onset  . Arthritis Mother 15    polymyositis  . Hypertension Father   . Heart disease Father   . Hypertension Brother   . Cancer Brother     Oral        Past medical history, social, surgical and family history all reviewed in electronic medical record.   Review of Systems: No headache, visual changes, nausea, vomiting, diarrhea, constipation, dizziness, abdominal pain, skin rash, fevers, chills, night sweats, weight loss, swollen lymph nodes, body aches, joint swelling, muscle aches, chest pain, shortness of breath, mood changes.   Objective There were no vitals taken for this visit.  General: No apparent distress alert and oriented x3 mood and affect normal, dressed appropriately.  HEENT: Pupils equal, extraocular movements intact  Respiratory: Patient's speak in full sentences and does not appear short of breath  Cardiovascular: No lower extremity edema, non tender, no erythema  Skin: Warm dry intact with no signs of  infection or rash on extremities or on axial skeleton.  Abdomen: Soft nontender  Neuro: Cranial nerves II through XII are intact, neurovascularly intact in all extremities with 2+ DTRs and 2+ pulses.  Lymph: No lymphadenopathy of posterior or anterior cervical chain or axillae bilaterally.  Gait normal with good balance and coordination.  MSK:  Non tender with full range of motion and good stability and symmetric strength and tone of shoulders, elbows, wrist, hip, and ankles bilaterally.  Knee: Bilateral Valgus deformity noted Tender to palpation over the medial joint line bilaterallyworse on the right side ROM full in flexion and extension and lower leg rotation. Ligaments with solid  consistent endpoints including ACL, PCL, LCL, MCL. Negative Mcmurray's, Apley's, and Thessalonian tests. Mild painful patellar compression. Patellar glide with moderate crepitus. Patellar and quadriceps tendons unremarkable. Hamstring and quadriceps strength is normal.   After informed written and verbal consent, patient was seated on exam table. Right knee was prepped with alcohol swab and utilizing anterolateral approach, patient's right knee space was injected with 4:1  marcaine 0.5%: Kenalog 40mg /dL. Patient tolerated the procedure well without immediate complications.     Impression and Recommendations:     This case required medical decision making of moderate complexity.

## 2015-04-22 ENCOUNTER — Encounter: Payer: Self-pay | Admitting: Family Medicine

## 2015-04-22 ENCOUNTER — Ambulatory Visit (INDEPENDENT_AMBULATORY_CARE_PROVIDER_SITE_OTHER): Payer: Medicare Other | Admitting: Family Medicine

## 2015-04-22 VITALS — BP 138/80 | HR 99 | Ht 64.0 in | Wt 203.0 lb

## 2015-04-22 DIAGNOSIS — M170 Bilateral primary osteoarthritis of knee: Secondary | ICD-10-CM | POA: Diagnosis not present

## 2015-04-22 NOTE — Patient Instructions (Signed)
Good to see you Ice is your friend Consider weight watchers We will get PT to call you  Call me if you change you mid on injections or brace Otherwise maybe check in in 6 weeks.

## 2015-04-22 NOTE — Progress Notes (Signed)
[name] [address] [place], [place] [address] Phone: [phone] Subjective:    I'm seeing this patient by the request  of:  [name], MD   CC: Bilateral knee pain follow up  [id number] MEMORI SAMMON is a 74 y.o. female coming in with complaint of bilateral knee pain.patient was found to have moderate osteophytic changes of the knees bilaterally on x-ray.  Patient was continuing to have right knee pain,  was given a steroid injection..patient was to continue conservative therapy. Patient states no improvement. Patient states that she is not doing the exercises on a regular basis.  Patient states overall she is not following directions on a regular basis. Continues to have pain in the medial aspect of the right knee more than the left knee. Denies any significant instability but states that she does not do some exercises because she is concerning will not hold her.  Past Medical History  Diagnosis Date  . Anxiety   . Hyperlipemia   . LBP (low back pain)   . Osteoarthritis   . Vitamin D deficiency   . Vitamin B deficiency   . Degenerative disk disease   . Diverticulosis   . PONV (postoperative nausea and vomiting)   . Hypertension     not treated  . Chronic kidney disease 1967    hx kidney infection   Past Surgical History  Procedure Laterality Date  . 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 Cervical fusion  1999 & 2009    C3-4 Elsner  . Cataract surgery      left  . Joint replacement    . Eye surgery Left 2012    cataract  . Spine surgery  1999&2009  . Tonsillectomy    . Appendectomy    . Breast surgery  2000    breast reduction... BREAST BIOPSY ON OCTOBER 2014  . Breast biopsy Left 06/03/2013    Procedure: BREAST BIOPSY WITH NEEDLE LOCALIZATION;  Surgeon: [name], MD;  Location: [facility];  Service: General;  Laterality: Left;  . Abdominal hysterectomy  2005    SUPRACERVICAL HYSTERECTOMY   Social  History  Substance Use Topics  . Smoking status: Former Smoker -- 3.00 packs/day for 20 years    Quit date: 04/19/1985  . Smokeless tobacco: Never Used  . Alcohol Use: Yes     Comment: occasionally    Allergies  Allergen Reactions  . Augmentin [Amoxicillin-Pot Clavulanate]     diarrhea  . Cymbalta [Duloxetine Hcl]     diarrhea  . Ezetimibe-Simvastatin   . Phentermine Hcl    Family History  Problem Relation Age of Onset  . Arthritis Mother 41    polymyositis  . Hypertension Father   . Heart disease Father   . Hypertension Brother   . Cancer Brother     Oral      Past medical history, social, surgical and family history all reviewed in electronic medical record.   Review of Systems: No headache, visual changes, nausea, vomiting, diarrhea, constipation, dizziness, abdominal pain, skin rash, fevers, chills, night sweats, weight loss, swollen lymph nodes, body aches, joint swelling, muscle aches, chest pain, shortness of breath, mood changes.   Objective Blood pressure 138/80, pulse 99, height 5\' 4"  (1.626 m), weight 203 lb (92.08 kg), SpO2 97 %.  General: No apparent distress alert and oriented x3 mood and affect normal, dressed appropriately.  HEENT: Pupils equal, extraocular movements intact  Respiratory: Patient's speak in full sentences and does not appear short of breath  Cardiovascular: No lower extremity edema, non tender, no erythema  Skin: Warm dry intact with no signs of infection or rash on extremities or on axial skeleton.  Abdomen: Soft nontender  Neuro: Cranial nerves II through XII are intact, neurovascularly intact in all extremities with 2+ DTRs and 2+ pulses.  Lymph: No lymphadenopathy of posterior or anterior cervical chain or axillae bilaterally.  Gait normal with good balance and coordination.  MSK:  Non tender with full range of motion and good stability and symmetric strength and tone of shoulders, elbows, wrist, hip, and ankles bilaterally.  Knee:  Bilateral Valgus deformity noted continued tenderness over the medial joint line ROM full in flexion and extension and lower leg rotation. Ligaments with solid consistent endpoints including ACL, PCL, LCL, [email or web address] instability of valgus stress Negative Mcmurray's, Apley's, and Thessalonian tests. Mild painful patellar compression. Patellar glide with moderate crepitus. Patellar and quadriceps tendons unremarkable. Hamstring and quadriceps strength is normal.  No change from previous exam      Impression and Recommendations:     This case required medical decision making of moderate complexity.

## 2015-04-22 NOTE — Progress Notes (Signed)
Pre visit review using our clinic review tool, if applicable. No additional management support is needed unless otherwise documented below in the visit note. 

## 2015-04-22 NOTE — Assessment & Plan Note (Signed)
Patient declined viscous supplementation. Patient will start on formal physical therapy. We discussed the possibility of a custom brace and irritated mild instability the patient's knee has. Discussed patient to remain active and encourage her weight loss. Patient come back and see me again in 6 weeks for further evaluation and treatment.  Spent  25 minutes with patient face-to-face and had greater than 50% of counseling including as described above in assessment and plan.

## 2015-05-04 ENCOUNTER — Encounter: Payer: Self-pay | Admitting: Rehabilitation

## 2015-05-04 ENCOUNTER — Ambulatory Visit: Payer: Medicare Other | Attending: Family Medicine | Admitting: Rehabilitation

## 2015-05-04 DIAGNOSIS — R262 Difficulty in walking, not elsewhere classified: Secondary | ICD-10-CM | POA: Insufficient documentation

## 2015-05-04 DIAGNOSIS — M25561 Pain in right knee: Secondary | ICD-10-CM | POA: Insufficient documentation

## 2015-05-04 DIAGNOSIS — M25562 Pain in left knee: Secondary | ICD-10-CM | POA: Diagnosis present

## 2015-05-04 NOTE — Patient Instructions (Signed)
Pt to continue with quad sets and SLR per MD instruction until formal HEP instruction

## 2015-05-04 NOTE — Therapy (Signed)
[facility] [address] [place], [place], [address] Phone: [phone]   Fax:  [phone]  Physical Therapy Evaluation  Patient Details  Name: [name] MRN: [id number] Date of Birth: Jul 11, 1941 Referring Provider:  Lyndal Pulley, DO  Encounter Date: 05/04/2015      PT End of Session - 05/04/15 1711    Visit Number 1   Number of Visits 10   Date for PT Re-Evaluation 06/08/15   PT Start Time 0263   PT Stop Time 1612   PT Time Calculation (min) 39 min   Activity Tolerance Patient tolerated treatment well      Past Medical History  Diagnosis Date  . Anxiety   . Hyperlipemia   . LBP (low back pain)   . Osteoarthritis   . Vitamin D deficiency   . Vitamin B deficiency   . Degenerative disk disease   . Diverticulosis   . PONV (postoperative nausea and vomiting)   . Hypertension     not treated  . Chronic kidney disease 1967    hx kidney infection    Past Surgical History  Procedure Laterality Date  . 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 Cervical fusion  1999 & 2009    C3-4 Elsner  . Cataract surgery      left  . Joint replacement    . Eye surgery Left 2012    cataract  . Spine surgery  1999&2009  . Tonsillectomy    . Appendectomy    . Breast surgery  2000    breast reduction... BREAST BIOPSY ON OCTOBER 2014  . Breast biopsy Left 06/03/2013    Procedure: BREAST BIOPSY WITH NEEDLE LOCALIZATION;  Surgeon: [name], MD;  Location: [facility];  Service: General;  Laterality: Left;  . Abdominal hysterectomy  2005    SUPRACERVICAL HYSTERECTOMY    There were no vitals filed for this visit.  Visit Diagnosis:  Arthralgia of both knees - Plan: PT plan of care cert/re-cert  Difficulty walking up stairs - Plan: PT plan of care cert/re-cert      Subjective Assessment - 05/04/15 1539    Subjective Pt presents with c/o Bilateral knee OA with pain and difficulty going up  and down the stairs, a curb, and getting in and out of the car.  Got a few exercises from the MD and did not do them.  Would like to have someone supervise and push her.  Received an injection in the R knee that did not help at all.     Pertinent History B THR in 2007, 2011   Limitations Walking;Standing   How long can you walk comfortably? walking up a hill or on an incline.  can walk the supermarket with only mild discomfort and has more difficulty with the hips   Diagnostic tests xray; mild to moderate degeneration   Patient Stated Goals improve knee strength and decrease knee pain   Currently in Pain? No/denies   Pain Score --  6/10 when going up and down stairs   Pain Location Knee   Pain Orientation Right   Pain Descriptors / Indicators Sharp;Aching   Pain Type Chronic pain   Aggravating Factors  stairs   Pain Relieving Factors rest   Effect of Pain on Daily Activities unable to walk like would like   Multiple Pain Sites Yes   Pain Location Knee   Pain Orientation Left   Pain  Descriptors / Indicators Aching;Burning   Pain Type Chronic pain   Pain Frequency Intermittent   Aggravating Factors  stairs   Pain Relieving Factors rest            [facility] PT Assessment - 05/04/15 0001    Assessment   Medical Diagnosis B knee OA   Onset Date/Surgical Date 08/01/13   Next MD Visit unknown   Prior Therapy no   Precautions   Precautions None   Restrictions   Weight Bearing Restrictions No   Balance Screen   Has the patient fallen in the past 6 months No   [place]   Living Arrangements Children   Prior Function   Level of Independence Independent   Cognition   Overall Cognitive Status Within Functional Limits for tasks assessed   Observation/Other Assessments   Focus on Therapeutic Outcomes (FOTO)  59% limited   ROM / Strength   AROM / PROM / Strength Strength;AROM   AROM   Overall AROM Comments limited due to THR  bilaterally; flexion and abduction decreased by 50%   Strength   Strength Assessment Site Hip;Knee   Right/Left Hip Right;Left   Right Hip Flexion 3+/5   Right Hip External Rotation  4-/5   Right Hip Internal Rotation 4/5   Right Hip ABduction 4-/5   Left Hip Flexion 3+/5   Left Hip External Rotation 4-/5   Left Hip Internal Rotation 4/5   Left Hip ABduction 4-/5   Right/Left Knee Right;Left   Right Knee Flexion 4/5   Right Knee Extension 4/5   Left Knee Flexion 4/5   Left Knee Extension 4/5   Flexibility   Soft Tissue Assessment /Muscle Length no   Palpation   Palpation comment no ttp at the knees   Special Tests    Special Tests Knee Special Tests   Knee Special tests  Step-up/Step Down Test   Step-up/Step Down    Findings Positive   Side  Right   Comments 4" step   Ambulation/Gait   Gait Comments ambulates with slight waddling gait due to hip weakness bilaterally.  reports no pain at the knees   High Level Balance   High Level Balance Comments Sl stance R: 10sec   L: 10 sec with increased sway bil                           PT Education - 05/04/15 1711    Education provided Yes   Education Details HEP for now, POC, diagnosis, correct shoe wear   Person(s) Educated Patient   Methods Explanation;Demonstration   Comprehension Verbalized understanding;Returned demonstration          PT Short Term Goals - 05/04/15 1716    PT SHORT TERM GOAL #1   Title pt will be independent with initial HEP   Time 2   Period Weeks   Status New           PT Long Term Goals - 05/04/15 1716    PT LONG TERM GOAL #1   Title pt will improve hip MMT to 4/5 all planes within available ROM bilaterally   Time 5   Period Weeks   Status New   PT LONG TERM GOAL #2   Title pt will demonstrate a 4" step up painfree bilaterally   Time 5   Period Weeks   Status New   PT LONG TERM GOAL #3  Title pt will be independent with final HEP   Time 5   Period Weeks   PT  LONG TERM GOAL #4   Title pt will improve FOTO to 48% or less   Time 5   Period Weeks   Status New               Plan - 05-16-2015 1712    Clinical Impression Statement Pt presents with bilateral knee pain and mild to moderate knee OA with onset of difficulty climbing stairs in the past year.  History of bilateral THR with decreased strength and hip pain contributing to decreased stability at the knees.  pt has performed aquatic therapy in the past which has helped.  Nothing else has been tried in terms of treatment except an injection in the R knee which did not help.  pt also exhibiting decreased balance single leg contributing to decreased stability   Pt will benefit from skilled therapeutic intervention in order to improve on the following deficits Decreased activity tolerance;Decreased strength;Difficulty walking;Pain   Rehab Potential Good   PT Frequency 2x / week   PT Duration --  4-5 weeks   PT Treatment/Interventions Cryotherapy;English as a second language teacher;Therapeutic activities;Therapeutic exercise;Balance training;Neuromuscular re-education;Manual techniques;Taping   PT Next Visit Plan formal HEP education/issue.  Begin bilateral hip/knee strengthening   Consulted and Agree with Plan of Care Patient          G-Codes - 05/16/2015 1718    Functional Assessment Tool Used FOTO 59% limited   Functional Limitation Mobility: Walking and moving around   Mobility: Walking and Moving Around Current Status [phone]) At least 40 percent but less than 60 percent impaired, limited or restricted   Mobility: Walking and Moving Around Goal Status [phone]) At least 20 percent but less than 40 percent impaired, limited or restricted       Problem List Patient Active Problem List   Diagnosis Date Noted  . Insomnia 02/09/2015  . Osteoarthritis of both knees 02/09/2015  . Hemorrhoid 02/09/2015  . Memory impairment 10/20/2014  . Abdominal pain  10/20/2014  . B12 deficiency 11/13/2013  . Right maxillary sinusitis 10/28/2013  . Chest pain 07/17/2013  . Situational mixed anxiety and depressive disorder 10/28/2012  . Vulvar lesion 05/16/2012  . Vaginal atrophy 04/26/2011  . Post-menopausal 04/26/2011  . PARESTHESIA 09/28/2010  . OBESITY 03/15/2010  . HIP PAIN 03/15/2010  . ALOPECIA 12/08/2009  . SINUSITIS, ACUTE 07/20/2009  . TOBACCO USE, QUIT 06/12/2009  . INGUINAL PAIN, RIGHT 08/12/2008  . UPPER RESPIRATORY INFECTION (URI) 09/19/2007  . NECK PAIN 06/06/2007  . VITAMIN D DEFICIENCY 05/02/2007  . HYPERLIPIDEMIA 05/02/2007  . ANEMIA, VITAMIN B12 DEFICIENCY NEC 05/02/2007  . Anxiety state 05/02/2007  . OSTEOARTHRITIS 05/02/2007  . LOW BACK PAIN 05/02/2007    Stark Bray, DPT, CMP 05/16/15, 5:21 PM  [facility] [address] [place], [place], [address] Phone: [phone]   Fax:  [phone]

## 2015-05-07 ENCOUNTER — Encounter: Payer: Self-pay | Admitting: Physical Therapy

## 2015-05-07 ENCOUNTER — Ambulatory Visit: Payer: Medicare Other | Admitting: Physical Therapy

## 2015-05-07 DIAGNOSIS — M25562 Pain in left knee: Principal | ICD-10-CM

## 2015-05-07 DIAGNOSIS — M25561 Pain in right knee: Secondary | ICD-10-CM | POA: Diagnosis not present

## 2015-05-07 DIAGNOSIS — R262 Difficulty in walking, not elsewhere classified: Secondary | ICD-10-CM

## 2015-05-07 NOTE — Therapy (Signed)
[facility] [address] [place], [place], [address] Phone: [phone]   Fax:  [phone]  Physical Therapy Treatment  Patient Details  Name: [name] MRN: [id number] Date of Birth: [birth year] Referring Provider:  Lyndal Pulley, DO  Encounter Date: 05/07/2015      PT End of Session - 05/07/15 1455    Visit Number 2   Number of Visits 10  Medicare   Date for PT Re-Evaluation 06/08/15   PT Start Time 1450   PT Stop Time 1530   PT Time Calculation (min) 40 min   Activity Tolerance Patient tolerated treatment well   Behavior During Therapy [facility] for tasks assessed/performed      Past Medical History  Diagnosis Date  . Anxiety   . Hyperlipemia   . LBP (low back pain)   . Osteoarthritis   . Vitamin D deficiency   . Vitamin B deficiency   . Degenerative disk disease   . Diverticulosis   . PONV (postoperative nausea and vomiting)   . Hypertension     not treated  . Chronic kidney disease 1967    hx kidney infection    Past Surgical History  Procedure Laterality Date  . 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 Cervical fusion  1999 & 2009    C3-4 Elsner  . Cataract surgery      left  . Joint replacement    . Eye surgery Left 2012    cataract  . Spine surgery  1999&2009  . Tonsillectomy    . Appendectomy    . Breast surgery  2000    breast reduction... BREAST BIOPSY ON OCTOBER 2014  . Breast biopsy Left 06/03/2013    Procedure: BREAST BIOPSY WITH NEEDLE LOCALIZATION;  Surgeon: [name], MD;  Location: [place];  Service: General;  Laterality: Left;  . Abdominal hysterectomy  2005    SUPRACERVICAL HYSTERECTOMY    There were no vitals filed for this visit.  Visit Diagnosis:  Arthralgia of both knees  Difficulty walking up stairs      Subjective Assessment - 05/07/15 1455    Subjective I felt good after eval.  Somtimes I get a quick crippling pain in inner  thigh.    Pertinent History B THR in 2007, 2011   Limitations Walking;Standing   How long can you walk comfortably? walking up a hill or on an incline.  can walk the supermarket with only mild discomfort and has more difficulty with the hips   Diagnostic tests xray; mild to moderate degeneration   Patient Stated Goals improve knee strength and decrease knee pain   Currently in Pain? Yes   Pain Score 5    Pain Location Knee   Pain Orientation Right;Left   Pain Descriptors / Indicators Aching;Sharp   Pain Type Chronic pain   Pain Onset More than a month ago   Pain Frequency Intermittent   Aggravating Factors  stairs   Pain Relieving Factors rest   Effect of Pain on Daily Activities unable to walk like I would like to   Multiple Pain Sites No                         [facility] Adult PT Treatment/Exercise - 05/07/15 0001    Exercises   Exercises Knee/Hip   Knee/Hip Exercises: Aerobic   Stationary Bike level 1 5 min  very cautious to  use   Knee/Hip Exercises: Supine   Short Arc Quad Sets 3 sets;15 reps;Left;Right  knees on large foam roll   Heel Slides Right;Left;10 reps;2 sets  vc to go slow   Straight Leg Raises Right;Left  tried 3 times but stopped due to pain   Other Supine Knee/Hip Exercises Bil. knees going outwar, bil. heel raises,    Modalities   Modalities Moist Heat   Moist Heat Therapy   Number Minutes Moist Heat 15 Minutes   Moist Heat Location Knee  bil.    Manual Therapy   Manual Therapy Soft tissue mobilization;Joint mobilization   Joint Mobilization bil. patella   Soft tissue mobilization bil. quadriceps and iliotibial banc, manual stretch of bil. hamstring                PT Education - 05/07/15 1538    Education provided Yes   Education Details instructed patient to use heat on her knees   Person(s) Educated Patient   Methods Explanation   Comprehension Verbalized understanding          PT Short Term Goals - 05/04/15 1716     PT SHORT TERM GOAL #1   Title pt will be independent with initial HEP   Time 2   Period Weeks   Status New           PT Long Term Goals - 05/04/15 1716    PT LONG TERM GOAL #1   Title pt will improve hip MMT to 4/5 all planes within available ROM bilaterally   Time 5   Period Weeks   Status New   PT LONG TERM GOAL #2   Title pt will demonstrate a 4" step up painfree bilaterally   Time 5   Period Weeks   Status New   PT LONG TERM GOAL #3   Title pt will be independent with final HEP   Time 5   Period Weeks   PT LONG TERM GOAL #4   Title pt will improve FOTO to 48% or less   Time 5   Period Weeks   Status New               Plan - 05/07/15 1539    Clinical Impression Statement Patient is a 74 year old female with diagnosis of milt ot moderate knee OA.  Patient has difficulty with exercise due to pain in her knees and cautious due to the pain.  Patient requires encoragement.  Patient has bil. hip THR that make it difficult for exercise.  Patient felt better after soft tissue work to thighs.  Patient will benefit from strengthening bil. LE, increase tissue mobility of bil. thighs, and improve mobility of bil. patella.    Pt will benefit from skilled therapeutic intervention in order to improve on the following deficits Decreased activity tolerance;Decreased strength;Difficulty walking;Pain   Rehab Potential Good   Clinical Impairments Affecting Rehab Potential None   PT Frequency 2x / week   PT Duration Other (comment)  5 weeks   PT Treatment/Interventions Cryotherapy;English as a second language teacher;Therapeutic activities;Therapeutic exercise;Balance training;Neuromuscular re-education;Manual techniques;Taping   PT Next Visit Plan heel slides, SAQ, quad stretch, hamstring stretch, LAQ   PT Home Exercise Plan Hamstring, quad and gastroc stretch, SAQ, LAQ   Recommended Other Services None   Consulted and Agree with Plan of Care  Patient        Problem List Patient Active Problem List   Diagnosis Date Noted  . Insomnia 02/09/2015  .  Osteoarthritis of both knees 02/09/2015  . Hemorrhoid 02/09/2015  . Memory impairment 10/20/2014  . Abdominal pain 10/20/2014  . B12 deficiency 11/13/2013  . Right maxillary sinusitis 10/28/2013  . Chest pain 07/17/2013  . Situational mixed anxiety and depressive disorder 10/28/2012  . Vulvar lesion 05/16/2012  . Vaginal atrophy 04/26/2011  . Post-menopausal 04/26/2011  . PARESTHESIA 09/28/2010  . OBESITY 03/15/2010  . HIP PAIN 03/15/2010  . ALOPECIA 12/08/2009  . SINUSITIS, ACUTE 07/20/2009  . TOBACCO USE, QUIT 06/12/2009  . INGUINAL PAIN, RIGHT 08/12/2008  . UPPER RESPIRATORY INFECTION (URI) 09/19/2007  . NECK PAIN 06/06/2007  . VITAMIN D DEFICIENCY 05/02/2007  . HYPERLIPIDEMIA 05/02/2007  . ANEMIA, VITAMIN B12 DEFICIENCY NEC 05/02/2007  . Anxiety state 05/02/2007  . OSTEOARTHRITIS 05/02/2007  . LOW BACK PAIN 05/02/2007    GRAY,CHERYL,PT 05/07/2015, 3:48 PM  [facility] [address] [place], [place], [address] Phone: [phone]   Fax:  [phone]

## 2015-05-18 ENCOUNTER — Encounter: Payer: Self-pay | Admitting: Rehabilitation

## 2015-05-18 ENCOUNTER — Ambulatory Visit: Payer: Medicare Other | Admitting: Rehabilitation

## 2015-05-18 DIAGNOSIS — R262 Difficulty in walking, not elsewhere classified: Secondary | ICD-10-CM

## 2015-05-18 DIAGNOSIS — M25561 Pain in right knee: Secondary | ICD-10-CM | POA: Diagnosis not present

## 2015-05-18 DIAGNOSIS — M25562 Pain in left knee: Principal | ICD-10-CM

## 2015-05-18 NOTE — Therapy (Signed)
[facility] [address], [place], [place], [address] Phone: [phone]   Fax:  [phone]  Physical Therapy Treatment  Patient Details  Name: [name] MRN: [id number] Date of Birth: September 06, 1940 No Data Recorded  Encounter Date: 05/18/2015      PT End of Session - 05/18/15 1525    Visit Number 3   Number of Visits 10   Date for PT Re-Evaluation 06/08/15   PT Start Time 0630   PT Stop Time 1523   PT Time Calculation (min) 38 min   Activity Tolerance Patient tolerated treatment well      Past Medical History  Diagnosis Date  . Anxiety   . Hyperlipemia   . LBP (low back pain)   . Osteoarthritis   . Vitamin D deficiency   . Vitamin B deficiency   . Degenerative disk disease   . Diverticulosis   . PONV (postoperative nausea and vomiting)   . Hypertension     not treated  . Chronic kidney disease 1967    hx kidney infection    Past Surgical History  Procedure Laterality Date  . 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 Cervical fusion  1999 & 2009    C3-4 Elsner  . Cataract surgery      left  . Joint replacement    . Eye surgery Left 2012    cataract  . Spine surgery  1999&2009  . Tonsillectomy    . Appendectomy    . Breast surgery  2000    breast reduction... BREAST BIOPSY ON OCTOBER 2014  . Breast biopsy Left 06/03/2013    Procedure: BREAST BIOPSY WITH NEEDLE LOCALIZATION;  Surgeon: [name], MD;  Location: [place];  Service: General;  Laterality: Left;  . Abdominal hysterectomy  2005    SUPRACERVICAL HYSTERECTOMY    There were no vitals filed for this visit.  Visit Diagnosis:  Arthralgia of both knees  Difficulty walking up stairs      Subjective Assessment - 05/18/15 1442    Subjective felt fine after first exercise session.  remembering exercises at home   Currently in Pain? Yes   Pain Score 5    Pain Location Knee   Pain Orientation Right;Left   Pain Descriptors / Indicators Aching   Pain Type Chronic pain   Aggravating Factors  stairs   Pain Relieving Factors rest                         [facility] Adult PT Treatment/Exercise - 05/18/15 0001    Knee/Hip Exercises: Stretches   Passive Hamstring Stretch Both;2 reps;30 seconds  with belt   Knee/Hip Exercises: Aerobic   Stationary Bike level 1 5 min   Knee/Hip Exercises: Seated   Long Arc Quad Both;2 sets;5 reps  5" holds   Knee/Hip Exercises: Supine   Short Arc Quad Sets Strengthening;Both;2 sets;10 reps   Heel Slides Both;1 set;5 reps  with pillowcase on foot   Bridges with [employer] 1 set;10 reps  with and without bridge   Straight Leg Raises Both;2 sets;5 sets   Other Supine Knee/Hip Exercises clam with red band x 15 bil   Knee/Hip Exercises: Sidelying   Clams x15 bil; too painful to lie on side                PT Education - 05/18/15 1523    Education provided Yes  Education Details added hooklying clam with tband and bridge   Person(s) Educated Patient   Methods Explanation;Demonstration   Comprehension Verbalized understanding          PT Short Term Goals - 05/04/15 1716    PT SHORT TERM GOAL #1   Title pt will be independent with initial HEP   Time 2   Period Weeks   Status New           PT Long Term Goals - 05/04/15 1716    PT LONG TERM GOAL #1   Title pt will improve hip MMT to 4/5 all planes within available ROM bilaterally   Time 5   Period Weeks   Status New   PT LONG TERM GOAL #2   Title pt will demonstrate a 4" step up painfree bilaterally   Time 5   Period Weeks   Status New   PT LONG TERM GOAL #3   Title pt will be independent with final HEP   Time 5   Period Weeks   PT LONG TERM GOAL #4   Title pt will improve FOTO to 48% or less   Time 5   Period Weeks   Status New               Plan - 05/18/15 1525    Clinical Impression Statement Pt tolerated exercise better today.  Denied needing STM  or heat after exercise component of therapy.  continue per POC.  pt OOT until next week   PT Next Visit Plan cont hip and knee strengthening        Problem List Patient Active Problem List   Diagnosis Date Noted  . Insomnia 02/09/2015  . Osteoarthritis of both knees 02/09/2015  . Hemorrhoid 02/09/2015  . Memory impairment 10/20/2014  . Abdominal pain 10/20/2014  . B12 deficiency 11/13/2013  . Right maxillary sinusitis 10/28/2013  . Chest pain 07/17/2013  . Situational mixed anxiety and depressive disorder 10/28/2012  . Vulvar lesion 05/16/2012  . Vaginal atrophy 04/26/2011  . Post-menopausal 04/26/2011  . PARESTHESIA 09/28/2010  . OBESITY 03/15/2010  . HIP PAIN 03/15/2010  . ALOPECIA 12/08/2009  . SINUSITIS, ACUTE 07/20/2009  . TOBACCO USE, QUIT 06/12/2009  . INGUINAL PAIN, RIGHT 08/12/2008  . UPPER RESPIRATORY INFECTION (URI) 09/19/2007  . NECK PAIN 06/06/2007  . VITAMIN D DEFICIENCY 05/02/2007  . HYPERLIPIDEMIA 05/02/2007  . ANEMIA, VITAMIN B12 DEFICIENCY NEC 05/02/2007  . Anxiety state 05/02/2007  . OSTEOARTHRITIS 05/02/2007  . LOW BACK PAIN 05/02/2007    Stark Bray, DPT, CMP 05/18/2015, 3:26 PM   [facility] [address] [place], [place], [address] Phone: [phone]   Fax:  [phone]  Name: AIRYANA SPRUNGER MRN: [id number] Date of Birth: Oct 18, 1940

## 2015-05-20 ENCOUNTER — Encounter: Payer: Medicare Other | Admitting: Physical Therapy

## 2015-05-21 ENCOUNTER — Encounter: Payer: Medicare Other | Admitting: Gynecology

## 2015-05-25 ENCOUNTER — Ambulatory Visit: Payer: Medicare Other | Admitting: Internal Medicine

## 2015-06-03 ENCOUNTER — Encounter: Payer: Self-pay | Admitting: Gynecology

## 2015-06-03 ENCOUNTER — Ambulatory Visit (INDEPENDENT_AMBULATORY_CARE_PROVIDER_SITE_OTHER): Payer: Medicare Other | Admitting: Gynecology

## 2015-06-03 VITALS — BP 116/74 | Ht 64.0 in | Wt 197.6 lb

## 2015-06-03 DIAGNOSIS — L900 Lichen sclerosus et atrophicus: Secondary | ICD-10-CM | POA: Diagnosis not present

## 2015-06-03 DIAGNOSIS — M8580 Other specified disorders of bone density and structure, unspecified site: Secondary | ICD-10-CM | POA: Diagnosis not present

## 2015-06-03 DIAGNOSIS — N952 Postmenopausal atrophic vaginitis: Secondary | ICD-10-CM

## 2015-06-03 DIAGNOSIS — Z79890 Hormone replacement therapy (postmenopausal): Secondary | ICD-10-CM | POA: Diagnosis not present

## 2015-06-03 DIAGNOSIS — Z01419 Encounter for gynecological examination (general) (routine) without abnormal findings: Secondary | ICD-10-CM

## 2015-06-03 MED ORDER — ESTRADIOL 0.1 MG/GM VA CREA: 2.0000 g | TOPICAL_CREAM | VAGINAL | Status: DC

## 2015-06-03 NOTE — Progress Notes (Signed)
[name] [birth year] [id number]   History:    74 y.o.  for annual gyn exam with no complaints today. Patient in the past has been diagnosed with lichen sclerosus of the vulva and has done well by applying clobetasol 0.05% weekly. For her vaginal atrophy she has been applying estradiol 0.02% twice a week. 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 The patient had been followed by the general surgeon. Patient scheduled for mammogram next week.  Dr. Alain Marion who has done her lab work. .Review record indicated also that she had a normal colonoscopy in 2008. Although several years ago in [place] she had benign colon polyp removed.She had a breast reduction in the year 2000. She had a left hip replacement 5 years ago and a right hip replacement last year. She has had a cervical discectomy C2-C3, C3-C4 in 1995 and 2009 respectively. She is also been followed regularly by Dr. Martinique her dermatologist for mole checks.Her last bone density study here in our office was in 2014 and her lowest T. score was at the distal one third of the radius with a value of -1.3 (osteopenia).Patient with history of supracervical hysterectomy October 2005 for adenomyosis and benign fibroids. The patient several years ago had history vitamin D deficiency. She is currently taking her calcium and vitamin D.   Bone density study 2014 distal right forearm T score -1.2  Her primary care physician is Past medical history,surgical history, family history and social history were all reviewed and documented in the EPIC chart.  Gynecologic History No LMP recorded. Patient has had a hysterectomy. Contraception: post menopausal status Last Pap: 2012. Results were: normal Last mammogram: 2015. Results were: See above  Obstetric History OB History  Gravida Para Term Preterm AB SAB  TAB Ectopic Multiple Living  2 1   1  1   1     # Outcome Date GA Lbr Len/2nd Weight Sex Delivery Anes PTL Lv  2 TAB           1 Para     M Vag-Spont          ROS: A ROS was performed and pertinent positives and negatives are included in the history.  GENERAL: No fevers or chills. HEENT: No change in vision, no earache, sore throat or sinus congestion. NECK: No pain or stiffness. CARDIOVASCULAR: No chest pain or pressure. No palpitations. PULMONARY: No shortness of breath, cough or wheeze. GASTROINTESTINAL: No abdominal pain, nausea, vomiting or diarrhea, melena or bright red blood per rectum. GENITOURINARY: No urinary frequency, urgency, hesitancy or dysuria. MUSCULOSKELETAL: No joint or muscle pain, no back pain, no recent trauma. DERMATOLOGIC: No rash, no itching, no lesions. ENDOCRINE: No polyuria, polydipsia, no heat or cold intolerance. No recent change in weight. HEMATOLOGICAL: No anemia or easy bruising or bleeding. NEUROLOGIC: No headache, seizures, numbness, tingling or weakness. PSYCHIATRIC: No depression, no loss of interest in normal activity or change in sleep pattern.     Exam: chaperone present  BP 116/74 mmHg  Ht 5\' 4"  (1.626 m)  Wt 197 lb 9.6 oz (89.631 kg)  BMI 33.90 kg/m2  Body mass index is 33.9 kg/(m^2).  General appearance : Well developed well nourished female. No acute distress HEENT: Eyes: no retinal hemorrhage or exudates,  Neck supple, trachea midline, no carotid bruits, no thyroidmegaly Lungs: Clear to auscultation, no rhonchi or  wheezes, or rib retractions  Heart: Regular rate and rhythm, no murmurs or gallops Breast:Examined   Physical Exam  Pulmonary/Chest:     there was no supraclavicular or axillary lymphadenopathy on either side. Contralateral breast no palpable masses.   Abdomen: no palpable masses or tenderness, no rebound or guarding Extremities: no edema or skin discoloration or tenderness  Pelvic:  Bartholin, Urethra, Skene Glands:  Within normal limits             Vagina: No gross lesions or discharge, atrophic changes  Cervix: No gross lesions or discharge  Uterus absent  Adnexa  Without masses or tenderness  Anus and perineum  normal   Rectovaginal  normal sphincter tone without palpated masses or tenderness             Hemoccult PCP provides     Assessment/Plan:  74 y.o. female for annual exam will be referred for diagnostic mammogram of the right breast and possible ultrasound. She will discontinue the compounded vaginal estrogen and we will prescribe her Estrace vaginal cream twice week for vaginal atrophy. She will continue to use the clobetasol to the external genitalia for her lichen sclerosus. Pap smear not indicated. PCP to schedule her next colonoscopy. Patient also to schedule bone density study later this year.   Terrance Mass MD, 3:28 PM 06/03/2015

## 2015-06-04 ENCOUNTER — Ambulatory Visit: Payer: Medicare Other | Attending: Family Medicine

## 2015-06-04 ENCOUNTER — Telehealth: Payer: Self-pay

## 2015-06-04 DIAGNOSIS — M25561 Pain in right knee: Secondary | ICD-10-CM | POA: Insufficient documentation

## 2015-06-04 DIAGNOSIS — R262 Difficulty in walking, not elsewhere classified: Secondary | ICD-10-CM | POA: Diagnosis present

## 2015-06-04 DIAGNOSIS — M25562 Pain in left knee: Secondary | ICD-10-CM | POA: Diagnosis present

## 2015-06-04 LAB — VITAMIN D 25 HYDROXY (VIT D DEFICIENCY, FRACTURES): Vit D, 25-Hydroxy: 29 ng/mL — ABNORMAL LOW (ref 30–100)

## 2015-06-04 MED ORDER — NONFORMULARY OR COMPOUNDED ITEM: Status: DC

## 2015-06-04 NOTE — Telephone Encounter (Signed)
Patient informed that Rx was called in and that [facility] will call her when it is ready.

## 2015-06-04 NOTE — Telephone Encounter (Signed)
Please call in prescription compound pharmacy as to.0.02% to apply intravaginally twice a week. Three-month supply 4 refills

## 2015-06-04 NOTE — Patient Instructions (Signed)
KNEE: Extension, Long Arc Quads - Sitting    Raise leg until knee is straight.  Hold 5 seconds _2x10__ reps per set, __2_ sets per day.  Copyright  VHI. All rights reserved.  HIP: Hamstrings - Short Sitting    Rest leg on raised surface. Keep knee straight. Lift chest. Hold _20__ seconds. __3_ reps per set, _3__ sets per day  Copyright  VHI. All rights reserved.  [facility] [address] [place], [place] [address] Phone # [phone] Fax [phone]

## 2015-06-04 NOTE — Telephone Encounter (Signed)
Patient saw you yesterday. The Estrace Cream at [facility] costs her $165.  She can get the Estradiol cream at [facility] and it's $46 dollars. She would like Rx for the compounded estradiol cream.

## 2015-06-04 NOTE — Therapy (Signed)
[facility] [address] [place], [place], [address] Phone: [phone]   Fax:  [phone]  Physical Therapy Treatment  Patient Details  Name: [name] MRN: [id number] Date of Birth: [birth year] Referring Provider: Antoine Primas, MD  Encounter Date: 06/04/2015      PT End of Session - 06/04/15 1528    Visit Number 4   Number of Visits 10   Date for PT Re-Evaluation 06/08/15   PT Start Time 1446   PT Stop Time 1528   PT Time Calculation (min) 42 min   Activity Tolerance Patient tolerated treatment well   Behavior During Therapy [facility] for tasks assessed/performed      Past Medical History  Diagnosis Date  . Anxiety   . Hyperlipemia   . LBP (low back pain)   . Osteoarthritis   . Vitamin D deficiency   . Vitamin B deficiency   . Degenerative disk disease   . Diverticulosis   . PONV (postoperative nausea and vomiting)   . Hypertension     not treated  . Chronic kidney disease 1967    hx kidney infection    Past Surgical History  Procedure Laterality Date  . 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 Cervical fusion  1999 & 2009    C3-4 Elsner  . Cataract surgery      left  . Joint replacement    . Eye surgery Left 2012    cataract  . Spine surgery  1999&2009  . Tonsillectomy    . Appendectomy    . Breast surgery  2000    breast reduction... BREAST BIOPSY ON OCTOBER 2014  . Breast biopsy Left 06/03/2013    Procedure: BREAST BIOPSY WITH NEEDLE LOCALIZATION;  Surgeon: Currie Paris, MD;  [facility];  Service: General;  Laterality: Left;  . Abdominal hysterectomy  2005    SUPRACERVICAL HYSTERECTOMY    There were no vitals filed for this visit.  Visit Diagnosis:  Arthralgia of both knees  Difficulty walking up stairs      Subjective Assessment - 06/04/15 1450    Subjective Pt was on vacation last week.  Knees are feeling OK   Currently in Pain? Yes   Pain  Score 0-No pain  with steps up to 5/10   Pain Location Knee   Pain Orientation Right;Left   Pain Descriptors / Indicators Aching   Pain Type Chronic pain   Pain Onset More than a month ago   Pain Frequency Intermittent   Aggravating Factors  stairs   Pain Relieving Factors rest            [facility] PT Assessment - 06/04/15 0001    Assessment   Referring Provider Antoine Primas, MD                     [facility] Adult PT Treatment/Exercise - 06/04/15 0001    Knee/Hip Exercises: Stretches   Active Hamstring Stretch 3 reps;20 seconds;Both   Knee/Hip Exercises: Aerobic   Stationary Bike level 1 x 8 min   Knee/Hip Exercises: Standing   Hip Abduction 2 sets;10 reps;Stengthening;Both   Hip Extension Stengthening;Both;2 sets;10 reps   Rebounder weight shifting 3 ways x 1 minutes each   Walking with Sports Cord 15# forward and reverse x 10 each   Knee/Hip Exercises: Seated   Long Arc Quad Both;2 sets;5 reps  5" holds  PT Education - 06/04/15 1519    Education provided Yes   Education Details HEP: seated hamstring stretch, long arc quads   Person(s) Educated Patient   Methods Explanation;Demonstration;Handout   Comprehension Verbalized understanding;Returned demonstration          PT Short Term Goals - 06/04/15 1455    PT SHORT TERM GOAL #1   Title pt will be independent with initial HEP   Status Achieved           PT Long Term Goals - 06/04/15 1455    PT LONG TERM GOAL #2   Title pt will demonstrate a 4" step up painfree bilaterally   Time 5   Period Weeks   Status On-going               Plan - 06/04/15 1500    Clinical Impression Statement Pt reports 30% overall improvement since the start of care.  Pt with continued pain with steps at home up to 8/10 at times.  Pt was out of town and was performing HEP while away.  Pt tolerated all activity in the clinic without pain today.  Pt will benefit from skilled PT for strength and  flexibility progression.   Pt will benefit from skilled therapeutic intervention in order to improve on the following deficits Decreased activity tolerance;Decreased strength;Difficulty walking;Pain   Rehab Potential Good   PT Frequency 2x / week   PT Duration Other (comment)  5 weeks   PT Treatment/Interventions Cryotherapy;Radiation protection practitioner;Therapeutic activities;Therapeutic exercise;Balance training;Neuromuscular re-education;Manual techniques;Taping   PT Next Visit Plan cont hip and knee strengthening, proprioception   Consulted and Agree with Plan of Care Patient        Problem List Patient Active Problem List   Diagnosis Date Noted  . Insomnia 02/09/2015  . Osteoarthritis of both knees 02/09/2015  . Hemorrhoid 02/09/2015  . Memory impairment 10/20/2014  . Abdominal pain 10/20/2014  . B12 deficiency 11/13/2013  . Right maxillary sinusitis 10/28/2013  . Chest pain 07/17/2013  . Situational mixed anxiety and depressive disorder 10/28/2012  . Vulvar lesion 05/16/2012  . Vaginal atrophy 04/26/2011  . Post-menopausal 04/26/2011  . PARESTHESIA 09/28/2010  . OBESITY 03/15/2010  . HIP PAIN 03/15/2010  . ALOPECIA 12/08/2009  . SINUSITIS, ACUTE 07/20/2009  . TOBACCO USE, QUIT 06/12/2009  . INGUINAL PAIN, RIGHT 08/12/2008  . UPPER RESPIRATORY INFECTION (URI) 09/19/2007  . NECK PAIN 06/06/2007  . VITAMIN D DEFICIENCY 05/02/2007  . HYPERLIPIDEMIA 05/02/2007  . ANEMIA, VITAMIN B12 DEFICIENCY NEC 05/02/2007  . Anxiety state 05/02/2007  . OSTEOARTHRITIS 05/02/2007  . LOW BACK PAIN 05/02/2007    Lulamae Skorupski, PT 06/04/2015, 3:29 PM  [facility] [address] [place], [place], [address] Phone: [phone]   Fax:  [phone]  Name: [name] MRN: [id number] Date of Birth: [birth year]

## 2015-06-05 ENCOUNTER — Telehealth: Payer: Self-pay | Admitting: *Deleted

## 2015-06-05 NOTE — Telephone Encounter (Signed)
Appointment on 06/08/15 @ 1:30pm left detailed message on voicemail and asked patient to call me to confirm she received message.

## 2015-06-05 NOTE — Telephone Encounter (Signed)
Pt called back and said she received my message.

## 2015-06-05 NOTE — Telephone Encounter (Signed)
-----   Message from Terrance Mass, MD sent at 06/03/2015  3:38 PM EDT ----- Anderson Malta, this patient next week had already a scheduled appointment at [facility] for a screening mammogram. Today at time of her exam she was found to have a firm nodule on her right breast 9:00 position 3 finger breast from the areolar region and will need a diagnostic mammogram possible ultrasound of that breast and routine screening mammogram on the contralateral breast.

## 2015-06-09 ENCOUNTER — Encounter: Payer: Self-pay | Admitting: Gynecology

## 2015-06-09 ENCOUNTER — Encounter: Payer: Self-pay | Admitting: Physical Therapy

## 2015-06-09 ENCOUNTER — Ambulatory Visit: Payer: Medicare Other | Admitting: Physical Therapy

## 2015-06-09 DIAGNOSIS — R262 Difficulty in walking, not elsewhere classified: Secondary | ICD-10-CM

## 2015-06-09 DIAGNOSIS — M25562 Pain in left knee: Principal | ICD-10-CM

## 2015-06-09 DIAGNOSIS — M25561 Pain in right knee: Secondary | ICD-10-CM

## 2015-06-09 NOTE — Therapy (Signed)
[facility] [address] [place], [place], [address] Phone: [phone]   Fax:  [phone]  Physical Therapy Treatment  Patient Details  Name: [name] MRN: [id number] Date of Birth: January 02, 1941 Referring Provider: Antoine Primas, MD  Encounter Date: 06/09/2015      PT End of Session - 06/09/15 1014    Visit Number 5   Number of Visits 10   Date for PT Re-Evaluation 06/08/15   PT Start Time 0930   PT Stop Time 1029   PT Time Calculation (min) 59 min   Activity Tolerance Patient tolerated treatment well   Behavior During Therapy [facility] for tasks assessed/performed      Past Medical History  Diagnosis Date  . Anxiety   . Hyperlipemia   . LBP (low back pain)   . Osteoarthritis   . Vitamin D deficiency   . Vitamin B deficiency   . Degenerative disk disease   . Diverticulosis   . PONV (postoperative nausea and vomiting)   . Hypertension     not treated  . Chronic kidney disease 1967    hx kidney infection    Past Surgical History  Procedure Laterality Date  . 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 Cervical fusion  1999 & 2009    C3-4 Elsner  . Cataract surgery      left  . Joint replacement    . Eye surgery Left 2012    cataract  . Spine surgery  1999&2009  . Tonsillectomy    . Appendectomy    . Breast surgery  2000    breast reduction... BREAST BIOPSY ON OCTOBER 2014  . Breast biopsy Left 06/03/2013    Procedure: BREAST BIOPSY WITH NEEDLE LOCALIZATION;  Surgeon: Currie Paris, MD;  Location: [facility] OR;  Service: General;  Laterality: Left;  . Abdominal hysterectomy  2005    SUPRACERVICAL HYSTERECTOMY    There were no vitals filed for this visit.  Visit Diagnosis:  Arthralgia of both knees  Difficulty walking up stairs      Subjective Assessment - 06/09/15 0944    Subjective Rt knee is really hurting pain is rated as 4-5/10 up to 8/10 and is constant   Pertinent  History B THR in 2007, 2011   Limitations Walking;Standing   Currently in Pain? Yes   Pain Score 4    Pain Location Knee   Pain Orientation Right;Left   Pain Descriptors / Indicators Aching   Pain Type Chronic pain   Pain Onset More than a month ago   Pain Frequency Intermittent   Multiple Pain Sites No                         [facility] Adult PT Treatment/Exercise - 06/09/15 0001    Ambulation/Gait   Gait Comments ambulates with slight waddling gait due to hip weakness bilaterally.  reports no pain at the knees   Exercises   Exercises Knee/Hip   Knee/Hip Exercises: Stretches   Active Hamstring Stretch 3 reps;20 seconds;Both   Passive Hamstring Stretch Both;2 reps;30 seconds   Knee/Hip Exercises: Aerobic   Stationary Bike level 1 x 8 min   Knee/Hip Exercises: Standing   Hip Abduction 2 sets;10 reps;Stengthening;Both   Hip Extension Stengthening;Both;2 sets;10 reps   Rebounder weight shifting 3 ways x 1 minutes each   Walking with Sports Cord 15# forward and reverse x 10 each  Knee/Hip Exercises: Seated   Long Arc Quad Both;2 sets;5 reps  5sec holds, with 2# on left no weight on the right   Knee/Hip Exercises: Supine   Straight Leg Raises Both;2 sets;10 reps  each leg   Modalities   Modalities Moist Heat   Moist Heat Therapy   Number Minutes Moist Heat 15 Minutes   Moist Heat Location Knee   Manual Therapy   Manual Therapy --   Joint Mobilization --   Soft tissue mobilization --                  PT Short Term Goals - 06/04/15 1455    PT SHORT TERM GOAL #1   Title pt will be independent with initial HEP   Status Achieved           PT Long Term Goals - 06/09/15 1017    PT LONG TERM GOAL #1   Title pt will improve hip MMT to 4/5 all planes within available ROM bilaterally   Time 5   Period Weeks   Status On-going   PT LONG TERM GOAL #2   Title pt will demonstrate a 4" step up painfree bilaterally   Time 5   Period Weeks   Status  On-going   PT LONG TERM GOAL #3   Title pt will be independent with final HEP   Time 5   Period Weeks   Status On-going   PT LONG TERM GOAL #4   Title pt will improve FOTO to 48% or less   Time 5   Period Weeks   Status On-going               Plan - 06/09/15 1015    Clinical Impression Statement Pt reports right knee has no constant pain, and she needs to negotiate the stairs with step to pattern. Pt unable to perform lLAQ's with 2 # added    Pt will benefit from skilled therapeutic intervention in order to improve on the following deficits Decreased activity tolerance;Decreased strength;Difficulty walking;Pain   Rehab Potential Good   PT Frequency 2x / week   PT Duration Other (comment)  5 weeks   PT Treatment/Interventions Cryotherapy;Radiation protection practitioner;Therapeutic activities;Therapeutic exercise;Balance training;Neuromuscular re-education;Manual techniques;Taping   PT Next Visit Plan cont hip and knee strengthening, proprioception   PT Home Exercise Plan Hamstring, quad and gastroc stretch, SAQ, LAQ   Consulted and Agree with Plan of Care Patient        Problem List Patient Active Problem List   Diagnosis Date Noted  . Insomnia 02/09/2015  . Osteoarthritis of both knees 02/09/2015  . Hemorrhoid 02/09/2015  . Memory impairment 10/20/2014  . Abdominal pain 10/20/2014  . B12 deficiency 11/13/2013  . Right maxillary sinusitis 10/28/2013  . Chest pain 07/17/2013  . Situational mixed anxiety and depressive disorder 10/28/2012  . Vulvar lesion 05/16/2012  . Vaginal atrophy 04/26/2011  . Post-menopausal 04/26/2011  . PARESTHESIA 09/28/2010  . OBESITY 03/15/2010  . HIP PAIN 03/15/2010  . ALOPECIA 12/08/2009  . SINUSITIS, ACUTE 07/20/2009  . TOBACCO USE, QUIT 06/12/2009  . INGUINAL PAIN, RIGHT 08/12/2008  . UPPER RESPIRATORY INFECTION (URI) 09/19/2007  . NECK PAIN 06/06/2007  . VITAMIN D DEFICIENCY 05/02/2007   . HYPERLIPIDEMIA 05/02/2007  . ANEMIA, VITAMIN B12 DEFICIENCY NEC 05/02/2007  . Anxiety state 05/02/2007  . OSTEOARTHRITIS 05/02/2007  . LOW BACK PAIN 05/02/2007    NAUMANN-HOUEGNIFIO,Cj Beecher PTA 06/09/2015, 10:20 AM  [facility] [address]  [address], [address] [place], [place], [address] Phone: [phone]   Fax:  [phone]  Name: SMITA HAUGH MRN: [id number] Date of Birth: 1941-04-17

## 2015-06-12 ENCOUNTER — Ambulatory Visit (INDEPENDENT_AMBULATORY_CARE_PROVIDER_SITE_OTHER): Payer: Medicare Other | Admitting: Internal Medicine

## 2015-06-12 ENCOUNTER — Encounter: Payer: Medicare Other | Admitting: Physical Therapy

## 2015-06-12 ENCOUNTER — Encounter: Payer: Self-pay | Admitting: Internal Medicine

## 2015-06-12 VITALS — BP 140/70 | HR 98 | Wt 198.0 lb

## 2015-06-12 DIAGNOSIS — M5440 Lumbago with sciatica, unspecified side: Secondary | ICD-10-CM

## 2015-06-12 DIAGNOSIS — E538 Deficiency of other specified B group vitamins: Secondary | ICD-10-CM | POA: Diagnosis not present

## 2015-06-12 DIAGNOSIS — H109 Unspecified conjunctivitis: Secondary | ICD-10-CM

## 2015-06-12 DIAGNOSIS — E669 Obesity, unspecified: Secondary | ICD-10-CM

## 2015-06-12 DIAGNOSIS — M170 Bilateral primary osteoarthritis of knee: Secondary | ICD-10-CM | POA: Diagnosis not present

## 2015-06-12 DIAGNOSIS — E559 Vitamin D deficiency, unspecified: Secondary | ICD-10-CM

## 2015-06-12 MED ORDER — ERYTHROMYCIN 5 MG/GM OP OINT: 1.0000 "application " | TOPICAL_OINTMENT | Freq: Two times a day (BID) | OPHTHALMIC | Status: DC

## 2015-06-12 MED ORDER — ERGOCALCIFEROL 1.25 MG (50000 UT) PO CAPS: 50000.0000 [IU] | ORAL_CAPSULE | ORAL | Status: DC

## 2015-06-12 NOTE — Assessment & Plan Note (Signed)
Vit D Rx °

## 2015-06-12 NOTE — Assessment & Plan Note (Signed)
Norco prn  Potential benefits of a long term opioids use as well as potential risks (i.e. addiction risk, apnea etc) and complications (i.e. Somnolence, constipation and others) were explained to the patient and were aknowledged. 

## 2015-06-12 NOTE — Progress Notes (Signed)
Subjective:  Patient ID: [name], female    DOB: [birth year]  Age: 75 y.o. MRN: [id number]  CC: No chief complaint on file.   HPI MARYCAROL HAUSE presents for HTN, OA, insomnia f/u  Outpatient Prescriptions Prior to Visit  Medication Sig Dispense Refill  . Acetaminophen (TYLENOL ARTHRITIS PAIN PO) Take by mouth.    Marland Kitchen amoxicillin (AMOXIL) 500 MG capsule Take 4 capsules (2,000 mg total) by mouth daily. For dental visits. 20 capsule 3  . Ascorbic Acid (VITAMIN C PO) Take by mouth daily.    . Biotin 1000 MCG tablet Take 2,000 mcg by mouth 3 (three) times daily.    . Cholecalciferol (VITAMIN D3) 1000 UNITS tablet Take 2,000 Units by mouth daily.      . clobetasol cream (TEMOVATE) [id number] % Apply 1 application topically 2 (two) times a week.    . diazepam (VALIUM) 5 MG tablet Take 1 tablet (5 mg total) by mouth 2 (two) times daily as needed for anxiety or muscle spasms. 60 tablet 2  . estradiol (ESTRACE) 0.1 MG/GM vaginal cream Place [id number] Applicatorfuls vaginally 2 (two) times a week. 42.5 g 8  . HYDROcodone-acetaminophen (NORCO) 5-325 MG per tablet Take 1 tablet by mouth every 4 (four) hours as needed. 30 tablet 0  . NONFORMULARY OR COMPOUNDED ITEM Estradiol vaginal cream 0.02% S: insert appful twice weekly 24 each 3  . Phosphatidylserine-DHA-EPA (VAYACOG) 100-19.5-6.5 MG CAPS Take 1 capsule by mouth daily. 30 capsule 11  . vitamin B-12 (CYANOCOBALAMIN) 1000 MCG tablet Take 1,000 mcg by mouth daily.      Marland Kitchen zolpidem (AMBIEN) 10 MG tablet Take 0.5-1 tablets (5-10 mg total) by mouth at bedtime as needed for sleep. 30 tablet 2   No facility-administered medications prior to visit.    ROS Review of Systems  Constitutional: Negative for chills, activity change, appetite change, fatigue and unexpected weight change.  HENT: Negative for congestion, mouth sores and sinus pressure.   Eyes: Negative for visual disturbance.  Respiratory: Negative for cough and chest tightness.     Gastrointestinal: Negative for nausea and abdominal pain.  Genitourinary: Negative for frequency, difficulty urinating and vaginal pain.  Musculoskeletal: Negative for back pain and gait problem.  Skin: Negative for pallor and rash.  Neurological: Negative for dizziness, tremors, weakness, numbness and headaches.  Psychiatric/Behavioral: Negative for suicidal ideas, confusion and sleep disturbance. The patient is nervous/anxious.     Objective:  BP 140/70 mmHg  Pulse 98  Wt 198 lb (89.812 kg)  SpO2 98%  BP Readings from Last 3 Encounters:  06/16/15 144/90  06/12/15 140/70  06/03/15 116/74    Wt Readings from Last 3 Encounters:  06/16/15 199 lb (90.266 kg)  06/12/15 198 lb (89.812 kg)  06/03/15 197 lb 9.6 oz (89.631 kg)    Physical Exam  Constitutional: She appears well-developed. No distress.  HENT:  Head: Normocephalic.  Right Ear: External ear normal.  Left Ear: External ear normal.  Nose: Nose normal.  Mouth/Throat: Oropharynx is clear and moist.  Eyes: Conjunctivae are normal. Pupils are equal, round, and reactive to light. Right eye exhibits no discharge. Left eye exhibits no discharge.  Neck: Normal range of motion. Neck supple. No JVD present. No tracheal deviation present. No thyromegaly present.  Cardiovascular: Normal rate, regular rhythm and normal heart sounds.   Pulmonary/Chest: No stridor. No respiratory distress. She has no wheezes. She has no rales.  Abdominal: Soft. Bowel sounds are normal. She exhibits no distension and no mass. There  is no tenderness. There is no rebound and no guarding.  Musculoskeletal: She exhibits no edema or tenderness.  Lymphadenopathy:    She has no cervical adenopathy.  Neurological: She displays normal reflexes. No cranial nerve deficit. She exhibits normal muscle tone. Coordination normal.  Skin: No rash noted. No erythema.  Psychiatric: She has a normal mood and affect. Her behavior is normal. Judgment and thought content  normal.    Lab Results  Component Value Date   WBC 8.1 10/20/2014   HGB 14.9 10/20/2014   HCT 43.7 10/20/2014   PLT 310.0 10/20/2014   GLUCOSE 94 10/20/2014   CHOL 221* 10/20/2014   TRIG 140.0 10/20/2014   HDL 54.00 10/20/2014   LDLDIRECT 161.3 06/17/2011   LDLCALC 139* 10/20/2014   ALT 18 10/20/2014   AST 17 10/20/2014   NA 138 10/20/2014   K 4.2 10/20/2014   CL 105 10/20/2014   CREATININE 0.68 10/20/2014   BUN 18 10/20/2014   CO2 27 10/20/2014   TSH 2.10 10/20/2014   INR 1.76* 08/29/2009    Dg Knee Bilateral Standing Ap  02/25/2015  CLINICAL DATA:  Bilateral knee pain, no acute injury EXAM: BILATERAL KNEES STANDING - 1 VIEW COMPARISON:  None. FINDINGS: Standing frontal views of the knees show very little degenerative joint disease for age. There may be slight loss of medial joint spaces with minimal spurring present. No acute abnormality is seen. 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 No acute abnormality. Electronically Signed   By: Ivar Drape M.D.   On: 02/25/2015 14:35    Assessment & Plan:   Diagnoses and all orders for this visit:  B12 deficiency  Bilateral conjunctivitis  Acute midline low back pain with sciatica, sciatica laterality unspecified  Primary osteoarthritis of both knees  OBESITY  Vitamin D deficiency  Other orders -     ergocalciferol (VITAMIN D2) 50000 UNITS capsule; Take 1 capsule (50,000 Units total) by mouth once a week. -     erythromycin ophthalmic ointment; Place 1 application into both eyes 2 (two) times daily.   I am having Ms. Guerrette start on ergocalciferol and erythromycin. I am also having her maintain her vitamin B-12, cholecalciferol, Biotin, clobetasol cream, Ascorbic Acid (VITAMIN C PO), amoxicillin, HYDROcodone-acetaminophen, diazepam, Phosphatidylserine-DHA-EPA, zolpidem, Acetaminophen (TYLENOL ARTHRITIS PAIN PO), estradiol, and NONFORMULARY OR COMPOUNDED ITEM.  Meds  ordered this encounter  Medications  . ergocalciferol (VITAMIN D2) 50000 UNITS capsule    Sig: Take 1 capsule (50,000 Units total) by mouth once a week.    Dispense:  6 capsule    Refill:  0  . erythromycin ophthalmic ointment    Sig: Place 1 application into both eyes 2 (two) times daily.    Dispense:  3.5 g    Refill:  0     Follow-up: Return in about 4 months (around 10/10/2015) for a follow-up visit.  [name], MD

## 2015-06-12 NOTE — Assessment & Plan Note (Signed)
Aleve

## 2015-06-12 NOTE — Assessment & Plan Note (Signed)
Wt Readings from Last 3 Encounters:  06/12/15 198 lb (89.812 kg)  06/03/15 197 lb 9.6 oz (89.631 kg)  04/22/15 203 lb (92.08 kg)

## 2015-06-12 NOTE — Assessment & Plan Note (Signed)
On B12 

## 2015-06-12 NOTE — Progress Notes (Signed)
Pre visit review using our clinic review tool, if applicable. No additional management support is needed unless otherwise documented below in the visit note. 

## 2015-06-16 ENCOUNTER — Encounter: Payer: Self-pay | Admitting: Family Medicine

## 2015-06-16 ENCOUNTER — Ambulatory Visit (INDEPENDENT_AMBULATORY_CARE_PROVIDER_SITE_OTHER): Payer: Medicare Other | Admitting: Family Medicine

## 2015-06-16 VITALS — BP 144/90 | HR 93 | Ht 64.0 in | Wt 199.0 lb

## 2015-06-16 DIAGNOSIS — M170 Bilateral primary osteoarthritis of knee: Secondary | ICD-10-CM

## 2015-06-16 NOTE — Progress Notes (Signed)
Pre visit review using our clinic review tool, if applicable. No additional management support is needed unless otherwise documented below in the visit note. 

## 2015-06-16 NOTE — Progress Notes (Signed)
[name] [address], [place] [address] Phone: [phone] Subjective:    I'm seeing this patient by the request  of:  [name], MD   CC: Bilateral knee pain follow up  [id number] GAY BYWATER is a 74 y.o. female coming in with complaint of bilateral knee pain.patient was found to have moderate osteophytic changes of the knees bilaterally on x-ray.  Patient was given injections in the knees greater than 3 months ago. Patient has been in formal physical therapy. Patient continues all other conservative therapy including icing regimen, high-dose vitamin D supplementation, as well as topical anti-inflammatories. Patient states she is doing better overall. Right knee continues to give her intermittent pain. Not every day though. No instability. Patient has felt that she made significant improvement with the physical therapy.  Past Medical History  Diagnosis Date  . Anxiety   . Hyperlipemia   . LBP (low back pain)   . Osteoarthritis   . Vitamin D deficiency   . Vitamin B deficiency   . Degenerative disk disease   . Diverticulosis   . PONV (postoperative nausea and vomiting)   . Hypertension     not treated  . Chronic kidney disease 1967    hx kidney infection   Past Surgical History  Procedure Laterality Date  . 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 Cervical fusion  1999 & 2009    C3-4 Elsner  . Cataract surgery      left  . Joint replacement    . Eye surgery Left 2012    cataract  . Spine surgery  1999&2009  . Tonsillectomy    . Appendectomy    . Breast surgery  2000    breast reduction... BREAST BIOPSY ON OCTOBER 2014  . Breast biopsy Left 06/03/2013    Procedure: BREAST BIOPSY WITH NEEDLE LOCALIZATION;  Surgeon: [name], MD;  Location: [facility];  Service: General;  Laterality: Left;  . Abdominal hysterectomy  2005    SUPRACERVICAL HYSTERECTOMY   Social History  Substance Use Topics  .  Smoking status: Former Smoker -- 3.00 packs/day for 20 years    Quit date: 04/19/1985  . Smokeless tobacco: Never Used  . Alcohol Use: Yes     Comment: occasionally    Allergies  Allergen Reactions  . Augmentin [Amoxicillin-Pot Clavulanate]     diarrhea  . Cymbalta [Duloxetine Hcl]     diarrhea  . Ezetimibe-Simvastatin   . Phentermine Hcl    Family History  Problem Relation Age of Onset  . Arthritis Mother 29    polymyositis  . Hypertension Father   . Heart disease Father   . Hypertension Brother   . Cancer Brother     Oral      Past medical history, social, surgical and family history all reviewed in electronic medical record.   Review of Systems: No headache, visual changes, nausea, vomiting, diarrhea, constipation, dizziness, abdominal pain, skin rash, fevers, chills, night sweats, weight loss, swollen lymph nodes, body aches, joint swelling, muscle aches, chest pain, shortness of breath, mood changes.   Objective Blood pressure 144/90, pulse 93, height 5\' 4"  (1.626 m), weight 199 lb (90.266 kg), SpO2 99 %.  General: No apparent distress alert and oriented x3 mood and affect normal, dressed appropriately.  HEENT: Pupils equal, extraocular movements intact  Respiratory: Patient's speak in full sentences and does not appear short of breath  Cardiovascular: No lower  extremity edema, non tender, no erythema  Skin: Warm dry intact with no signs of infection or rash on extremities or on axial skeleton.  Abdomen: Soft nontender  Neuro: Cranial nerves II through XII are intact, neurovascularly intact in all extremities with 2+ DTRs and 2+ pulses.  Lymph: No lymphadenopathy of posterior or anterior cervical chain or axillae bilaterally.  Gait normal with good balance and coordination.  MSK:  Non tender with full range of motion and good stability and symmetric strength and tone of shoulders, elbows, wrist, hip, and ankles bilaterally.  Knee: Bilateral Valgus deformity noted  continued tenderness over the medial joint line on right knee but none on the left knee.  ROM full in flexion and extension and lower leg rotation. Ligaments with solid consistent endpoints including ACL, PCL, LCL, [email or web address] instability of valgus stress Negative Mcmurray's, Apley's, and Thessalonian tests. Mild painful patellar compression. Patellar glide with moderate crepitus. Patellar and quadriceps tendons unremarkable. Hamstring and quadriceps strength is normal.  Improvement of left knee from previous exam.       Impression and Recommendations:     This case required medical decision making of moderate complexity.

## 2015-06-16 NOTE — Patient Instructions (Signed)
Good to see you Ice can always be helpful You have made great strides.  Continue to stay active Continue the vitamins I like the once weekly vitaminD and when done then 4000 IU daily thereafter You know where I am if you need me Happy holidays!

## 2015-06-16 NOTE — Assessment & Plan Note (Signed)
Patient overall is doing relatively well. We discussed that we could repeat injection but patient declined. Patient will continue with the conservative therapy. His lungs patient does well she can follow-up on an as-needed basis. We discussed the possibility of needing viscous supplementation in the future.

## 2015-06-21 ENCOUNTER — Encounter: Payer: Self-pay | Admitting: Internal Medicine

## 2015-07-01 ENCOUNTER — Ambulatory Visit: Payer: Medicare Other

## 2015-07-01 DIAGNOSIS — M25561 Pain in right knee: Secondary | ICD-10-CM | POA: Diagnosis not present

## 2015-07-01 DIAGNOSIS — M25562 Pain in left knee: Principal | ICD-10-CM

## 2015-07-01 DIAGNOSIS — R262 Difficulty in walking, not elsewhere classified: Secondary | ICD-10-CM

## 2015-07-01 NOTE — Therapy (Signed)
[facility] [address], [address] [place], [place], [address] Phone: [phone]   Fax:  [phone]  Physical Therapy Treatment  Patient Details  Name: [name] MRN: [id number] Date of Birth: [birth year] Referring Provider: Antoine Primas, MD  Encounter Date: 07/01/2015      PT End of Session - 07/01/15 1226    Visit Number 6   Number of Visits 10   Date for PT Re-Evaluation 08/12/15   PT Start Time 1147   PT Stop Time 1228   PT Time Calculation (min) 41 min   Activity Tolerance Patient tolerated treatment well   Behavior During Therapy [facility] for tasks assessed/performed      Past Medical History  Diagnosis Date  . Anxiety   . Hyperlipemia   . LBP (low back pain)   . Osteoarthritis   . Vitamin D deficiency   . Vitamin B deficiency   . Degenerative disk disease   . Diverticulosis   . PONV (postoperative nausea and vomiting)   . Hypertension     not treated  . Chronic kidney disease 1967    hx kidney infection    Past Surgical History  Procedure Laterality Date  . 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 Cervical fusion  1999 & 2009    C3-4 Elsner  . Cataract surgery      left  . Joint replacement    . Eye surgery Left 2012    cataract  . Spine surgery  1999&2009  . Tonsillectomy    . Appendectomy    . Breast surgery  2000    breast reduction... BREAST BIOPSY ON OCTOBER 2014  . Breast biopsy Left 06/03/2013    Procedure: BREAST BIOPSY WITH NEEDLE LOCALIZATION;  Surgeon: Currie Paris, MD;  [facility];  Service: General;  Laterality: Left;  . Abdominal hysterectomy  2005    SUPRACERVICAL HYSTERECTOMY    There were no vitals filed for this visit.  Visit Diagnosis:  Arthralgia of both knees - Plan: PT plan of care cert/re-cert  Difficulty walking up stairs - Plan: PT plan of care cert/re-cert      Subjective Assessment - 07/01/15 1145    Subjective Pt reports 50%   overall improvement since the start of care   Pertinent History B THR in 2007, 2011   Diagnostic tests xray; mild to moderate degeneration   Currently in Pain? Yes   Pain Score 4   pain ranges 0-9/10 depending on activity   Pain Location Knee   Pain Orientation Right;Left   Pain Descriptors / Indicators Aching   Pain Type Chronic pain   Pain Onset More than a month ago   Pain Frequency Intermittent   Aggravating Factors  steps (8-9/10 with negotiating steps), walking >45 minutes   Pain Relieving Factors rest            [facility] PT Assessment - 07/01/15 0001    Assessment   Medical Diagnosis B knee OA   Onset Date/Surgical Date 08/01/13   Home Environment   Living Environment Private residence   Prior Function   Level of Independence Independent   Cognition   Overall Cognitive Status Within Functional Limits for tasks assessed   Observation/Other Assessments   Focus on Therapeutic Outcomes (FOTO)  53% limitation   Strength   Strength Assessment Site Knee;Hip   Right/Left Hip Right;Left   Right Hip Flexion 4/5   Left Hip Flexion 4+/5  Right/Left Knee Right;Left   Right Knee Flexion 4+/5   Right Knee Extension 4+/5   Left Knee Flexion 5/5   Left Knee Extension 5/5                     [facility] Adult PT Treatment/Exercise - 07/01/15 0001    Knee/Hip Exercises: Stretches   Active Hamstring Stretch 3 reps;20 seconds;Both   Knee/Hip Exercises: Aerobic   Stationary Bike level 1 x 8 min   Knee/Hip Exercises: Standing   Hip Abduction 2 sets;10 reps;Stengthening;Both   Hip Extension Stengthening;Both;2 sets;10 reps   Rebounder weight shifting 3 ways x 1 minutes each   Walking with Sports Cord 15# forward and reverse x 10 each   Knee/Hip Exercises: Seated   Long Arc Quad Both;2 sets;5 reps  5sec holds, with 2# on left no weight on the right                  PT Short Term Goals - 07/01/15 1156    PT SHORT TERM GOAL #1   Title pt will be independent  with initial HEP   Status Achieved           PT Long Term Goals - 07/01/15 1156    PT LONG TERM GOAL #1   Title pt will improve hip MMT to 4/5 all planes within available ROM bilaterally   Status Achieved   PT LONG TERM GOAL #2   Title pt will demonstrate a 4" step up painfree bilaterally   Time 6   Period Weeks   Status On-going  up to 8/10 with ascending curb or low step on the Rt   PT LONG TERM GOAL #3   Title pt will be independent with final HEP   Time 6   Period Weeks   Status On-going   PT LONG TERM GOAL #4   Title pt will improve FOTO to 48% or less   Time 6   Period Weeks   Status On-going  53% limitation               Plan - 07/01/15 1158    Clinical Impression Statement Pt reports 50% reduction in bil. knee symptoms since the start of care.  Pt is independent in current HEP for strength and flexibility.  Pt reports up to 8/10 Rt knee pain with ascending and descending steps or curbs.  Pt is using step-to gait pattern with ascending and descending steps.  Pt with continued weakness in bil. hips and knees (see MMT) but this is improved.  Pt will benefit from skilled PT for strength, flexibility and proprioception progression and pain management as needed.     Pt will benefit from skilled therapeutic intervention in order to improve on the following deficits Decreased activity tolerance;Decreased strength;Difficulty walking;Pain   Rehab Potential Good   PT Frequency 2x / week   PT Duration 6 weeks   PT Treatment/Interventions Cryotherapy;Radiation protection practitioner;Therapeutic activities;Therapeutic exercise;Balance training;Neuromuscular re-education;Manual techniques;Taping;Patient/family education   PT Next Visit Plan cont hip and knee strengthening, proprioception        Problem List Patient Active Problem List   Diagnosis Date Noted  . Conjunctivitis 06/12/2015  . Insomnia 02/09/2015  . Osteoarthritis  of both knees 02/09/2015  . Hemorrhoid 02/09/2015  . Memory impairment 10/20/2014  . Abdominal pain 10/20/2014  . B12 deficiency 11/13/2013  . Right maxillary sinusitis 10/28/2013  . Chest pain 07/17/2013  . Situational mixed anxiety and depressive disorder 10/28/2012  .  Vulvar lesion 05/16/2012  . Vaginal atrophy 04/26/2011  . Post-menopausal 04/26/2011  . PARESTHESIA 09/28/2010  . OBESITY 03/15/2010  . HIP PAIN 03/15/2010  . ALOPECIA 12/08/2009  . SINUSITIS, ACUTE 07/20/2009  . TOBACCO USE, QUIT 06/12/2009  . INGUINAL PAIN, RIGHT 08/12/2008  . UPPER RESPIRATORY INFECTION (URI) 09/19/2007  . NECK PAIN 06/06/2007  . Vitamin D deficiency 05/02/2007  . HYPERLIPIDEMIA 05/02/2007  . ANEMIA, VITAMIN B12 DEFICIENCY NEC 05/02/2007  . Anxiety state 05/02/2007  . OSTEOARTHRITIS 05/02/2007  . LOW BACK PAIN 05/02/2007    Joanna Borawski, PT 07/01/2015, 12:29 PM  [facility] [address], [address] [place], [place], [address] Phone: [phone]   Fax:  [phone]  Name: [name] MRN: [id number] Date of Birth: 11-Aug-1940

## 2015-07-07 ENCOUNTER — Ambulatory Visit: Payer: Medicare Other | Attending: Family Medicine

## 2015-07-07 DIAGNOSIS — M25562 Pain in left knee: Secondary | ICD-10-CM | POA: Insufficient documentation

## 2015-07-07 DIAGNOSIS — R262 Difficulty in walking, not elsewhere classified: Secondary | ICD-10-CM | POA: Insufficient documentation

## 2015-07-07 DIAGNOSIS — M25561 Pain in right knee: Secondary | ICD-10-CM | POA: Diagnosis not present

## 2015-07-07 NOTE — Therapy (Signed)
[facility] [address], [place], [place], [address] Phone: [phone]   Fax:  [phone]  Physical Therapy Treatment  Patient Details  Name: [name] MRN: [id number] Date of Birth: November 14, 1940 Referring Provider: Hulan Saas, MD  Encounter Date: 07/07/2015      PT End of Session - 07/07/15 1256    Visit Number 7   Number of Visits 10   Date for PT Re-Evaluation 08/12/15   PT Start Time 1225   PT Stop Time 1305   PT Time Calculation (min) 40 min   Activity Tolerance Patient tolerated treatment well   Behavior During Therapy [facility] for tasks assessed/performed      Past Medical History  Diagnosis Date  . Anxiety   . Hyperlipemia   . LBP (low back pain)   . Osteoarthritis   . Vitamin D deficiency   . Vitamin B deficiency   . Degenerative disk disease   . Diverticulosis   . PONV (postoperative nausea and vomiting)   . Hypertension     not treated  . Chronic kidney disease 1967    hx kidney infection    Past Surgical History  Procedure Laterality Date  . 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 Cervical fusion  1999 & 2009    C3-4 Elsner  . Cataract surgery      left  . Joint replacement    . Eye surgery Left 2012    cataract  . Spine surgery  1999&2009  . Tonsillectomy    . Appendectomy    . Breast surgery  2000    breast reduction... BREAST BIOPSY ON OCTOBER 2014  . Breast biopsy Left 06/03/2013    Procedure: BREAST BIOPSY WITH NEEDLE LOCALIZATION;  Surgeon: [name], MD;  Location: [place];  Service: General;  Laterality: Left;  . Abdominal hysterectomy  2005    SUPRACERVICAL HYSTERECTOMY    There were no vitals filed for this visit.  Visit Diagnosis:  Arthralgia of both knees  Difficulty walking up stairs      Subjective Assessment - 07/07/15 1229    Subjective Pt reports that her knees are feeling much better.  Pain only with going up/down the  steps.    Currently in Pain? No/denies                         [facility] Adult PT Treatment/Exercise - 07/07/15 0001    Knee/Hip Exercises: Stretches   Active Hamstring Stretch 3 reps;20 seconds;Both   Knee/Hip Exercises: Aerobic   Stationary Bike level 2 x 8 min   Knee/Hip Exercises: Standing   Hip Abduction 2 sets;10 reps;Stengthening;Both   Hip Extension Stengthening;Both;2 sets;10 reps   Rocker Board 3 minutes   Rebounder weight shifting 3 ways x 1 minutes each   Walking with Sports Cord 15# forward and reverse x 10 each   Knee/Hip Exercises: Seated   Long Arc Quad Both;2 sets;10 reps  5sec holds, with 2# on left no weight on the right                  PT Short Term Goals - 07/01/15 1156    PT SHORT TERM GOAL #1   Title pt will be independent with initial HEP   Status Achieved           PT Long Term Goals - 07/07/15 1232    PT LONG TERM  GOAL #1   Title pt will improve hip MMT to 4/5 all planes within available ROM bilaterally   Status Achieved   PT LONG TERM GOAL #2   Title pt will demonstrate a 4" step up painfree bilaterally   Time 6   Period Weeks   Status On-going   PT LONG TERM GOAL #3   Title pt will be independent with final HEP   Time 6   Period Weeks   PT LONG TERM GOAL #4   Title pt will improve FOTO to 48% or less   Time 6   Period Weeks   Status On-going               Plan - 07/07/15 1232    Clinical Impression Statement Pt reports 50% reduction in bil. knee symtoms since the start of care.  Pt is independent in current HEP for strength and flexibility.  Pt reports up to 6-7/10 Rt knee pain with ascending and descending steps or curbs.  Pt is using step-to gait pattern with ascending and descending steps.  Pt with continued weakness and bil. hips and knees but this is improved from evaluation.  Pt will benefit from skilled PT for strength, flexibility and prorpioception progresion and pain management as needed.     Pt  will benefit from skilled therapeutic intervention in order to improve on the following deficits Decreased activity tolerance;Decreased strength;Difficulty walking;Pain   Rehab Potential Good   PT Frequency 2x / week   PT Duration 6 weeks   PT Treatment/Interventions Cryotherapy;English as a second language teacher;Therapeutic activities;Therapeutic exercise;Balance training;Neuromuscular re-education;Manual techniques;Taping;Patient/family education   PT Next Visit Plan cont hip and knee strengthening, proprioception   Consulted and Agree with Plan of Care Patient        Problem List Patient Active Problem List   Diagnosis Date Noted  . Conjunctivitis 06/12/2015  . Insomnia 02/09/2015  . Osteoarthritis of both knees 02/09/2015  . Hemorrhoid 02/09/2015  . Memory impairment 10/20/2014  . Abdominal pain 10/20/2014  . B12 deficiency 11/13/2013  . Right maxillary sinusitis 10/28/2013  . Chest pain 07/17/2013  . Situational mixed anxiety and depressive disorder 10/28/2012  . Vulvar lesion 05/16/2012  . Vaginal atrophy 04/26/2011  . Post-menopausal 04/26/2011  . PARESTHESIA 09/28/2010  . OBESITY 03/15/2010  . HIP PAIN 03/15/2010  . ALOPECIA 12/08/2009  . SINUSITIS, ACUTE 07/20/2009  . TOBACCO USE, QUIT 06/12/2009  . INGUINAL PAIN, RIGHT 08/12/2008  . UPPER RESPIRATORY INFECTION (URI) 09/19/2007  . NECK PAIN 06/06/2007  . Vitamin D deficiency 05/02/2007  . HYPERLIPIDEMIA 05/02/2007  . ANEMIA, VITAMIN B12 DEFICIENCY NEC 05/02/2007  . Anxiety state 05/02/2007  . OSTEOARTHRITIS 05/02/2007  . LOW BACK PAIN 05/02/2007    Vida Nicol, PT 07/07/2015, 12:57 PM  [facility] [address], [place], [place], [address] Phone: [phone]   Fax:  [phone]  Name: ANWAR MASTEN MRN: [id number] Date of Birth: [birth year]

## 2015-07-15 ENCOUNTER — Ambulatory Visit: Payer: Medicare Other

## 2015-07-15 DIAGNOSIS — M25561 Pain in right knee: Secondary | ICD-10-CM

## 2015-07-15 DIAGNOSIS — M25562 Pain in left knee: Principal | ICD-10-CM

## 2015-07-15 DIAGNOSIS — R262 Difficulty in walking, not elsewhere classified: Secondary | ICD-10-CM

## 2015-07-15 NOTE — Therapy (Addendum)
[facility] [address], [address] [place], [place], [address] Phone: [phone]   Fax:  [phone]  Physical Therapy Treatment  Patient Details  Name: [name] MRN: [id number] Date of Birth: [birth year] Referring Provider: Antoine Primas, MD  Encounter Date: 07/15/2015      PT End of Session - 07/15/15 1143    Visit Number 8   Number of Visits 10   Date for PT Re-Evaluation 08/12/15   PT Start Time 1103   PT Stop Time 1142   PT Time Calculation (min) 39 min   Activity Tolerance Patient tolerated treatment well   Behavior During Therapy [facility] for tasks assessed/performed      Past Medical History  Diagnosis Date  . Anxiety   . Hyperlipemia   . LBP (low back pain)   . Osteoarthritis   . Vitamin D deficiency   . Vitamin B deficiency   . Degenerative disk disease   . Diverticulosis   . PONV (postoperative nausea and vomiting)   . Hypertension     not treated  . Chronic kidney disease 1967    hx kidney infection    Past Surgical History  Procedure Laterality Date  . 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 Cervical fusion  1999 & 2009    C3-4 Elsner  . Cataract surgery      left  . Joint replacement    . Eye surgery Left 2012    cataract  . Spine surgery  1999&2009  . Tonsillectomy    . Appendectomy    . Breast surgery  2000    breast reduction... BREAST BIOPSY ON OCTOBER 2014  . Breast biopsy Left 06/03/2013    Procedure: BREAST BIOPSY WITH NEEDLE LOCALIZATION;  Surgeon: Currie Paris, MD;  [facility]: [facility];  Service: General;  Laterality: Left;  . Abdominal hysterectomy  2005    SUPRACERVICAL HYSTERECTOMY    There were no vitals filed for this visit.  Visit Diagnosis:  Arthralgia of both knees  Difficulty walking up stairs      Subjective Assessment - 07/15/15 1109    Subjective Doing well.  Knee is sore due to standing a lot with shopping.     Currently in Pain?  No/denies   Pain Score 5    Pain Location Knee   Pain Orientation Right   Pain Descriptors / Indicators Aching   Pain Type Chronic pain   Pain Onset More than a month ago   Pain Frequency Intermittent   Aggravating Factors  steps, walking   Pain Relieving Factors rest                         [facility] Adult PT Treatment/Exercise - 07/15/15 0001    Knee/Hip Exercises: Stretches   Active Hamstring Stretch 3 reps;20 seconds;Both   Knee/Hip Exercises: Aerobic   Stationary Bike level 2 x 8 min   Knee/Hip Exercises: Standing   Hip Abduction 2 sets;10 reps;Stengthening;Both  pain on Rt with standing on Rt   Rocker Board 3 minutes   Rebounder weight shifting 3 ways x 1 minutes each   Walking with Sports Cord 15# forward and reverse x 10 each   Knee/Hip Exercises: Seated   Long Arc Quad Both;2 sets;10 reps  5sec holds, with 2# on left no weight on the right  PT Short Term Goals - 07/01/15 1156    PT SHORT TERM GOAL #1   Title pt will be independent with initial HEP   Status Achieved           PT Long Term Goals - 07/07/15 1232    PT LONG TERM GOAL #1   Title pt will improve hip MMT to 4/5 all planes within available ROM bilaterally   Status Achieved   PT LONG TERM GOAL #2   Title pt will demonstrate a 4" step up painfree bilaterally   Time 6   Period Weeks   Status On-going   PT LONG TERM GOAL #3   Title pt will be independent with final HEP   Time 6   Period Weeks   PT LONG TERM GOAL #4   Title pt will improve FOTO to 48% or less   Time 6   Period Weeks   Status On-going               Plan - 07/15/15 1121    Clinical Impression Statement Pt reports 50% reduction in bil knee symptoms since the start of care.  Pt is independent in current HEP for strength and flexibility.  Pt reports 5/10 Rt knee pain this visit due to doing a lot of shopping.  Pt is using step-to gait with steps and curbs.  Pt with continued weakness in  bil. hips and knees and will benefit from skilled PT for strength, flexibility and proprioception progression and pain management as needed.   Pt will benefit from skilled therapeutic intervention in order to improve on the following deficits Decreased activity tolerance;Decreased strength;Difficulty walking;Pain   Rehab Potential Good   PT Frequency 2x / week   PT Duration 6 weeks   PT Treatment/Interventions Cryotherapy;Radiation protection practitioner;Therapeutic activities;Therapeutic exercise;Balance training;Neuromuscular re-education;Manual techniques;Taping;Patient/family education   PT Next Visit Plan cont hip and knee strengthening, proprioception.  Pt will be out of town until 08/2015   Consulted and Agree with Plan of Care Patient        Problem List Patient Active Problem List   Diagnosis Date Noted  . Conjunctivitis 06/12/2015  . Insomnia 02/09/2015  . Osteoarthritis of both knees 02/09/2015  . Hemorrhoid 02/09/2015  . Memory impairment 10/20/2014  . Abdominal pain 10/20/2014  . B12 deficiency 11/13/2013  . Right maxillary sinusitis 10/28/2013  . Chest pain 07/17/2013  . Situational mixed anxiety and depressive disorder 10/28/2012  . Vulvar lesion 05/16/2012  . Vaginal atrophy 04/26/2011  . Post-menopausal 04/26/2011  . PARESTHESIA 09/28/2010  . OBESITY 03/15/2010  . HIP PAIN 03/15/2010  . ALOPECIA 12/08/2009  . SINUSITIS, ACUTE 07/20/2009  . TOBACCO USE, QUIT 06/12/2009  . INGUINAL PAIN, RIGHT 08/12/2008  . UPPER RESPIRATORY INFECTION (URI) 09/19/2007  . NECK PAIN 06/06/2007  . Vitamin D deficiency 05/02/2007  . HYPERLIPIDEMIA 05/02/2007  . ANEMIA, VITAMIN B12 DEFICIENCY NEC 05/02/2007  . Anxiety state 05/02/2007  . OSTEOARTHRITIS 05/02/2007  . LOW BACK PAIN 05/02/2007    Brahm Barbeau, PT 07/15/2015, 11:54 AM  [facility] [address] [place], [place], [address] Phone: [phone]   Fax:  [phone]  Name: JAYCEONNA MONCE MRN: [id number] Date of Birth: 03-21-1941

## 2015-07-16 ENCOUNTER — Ambulatory Visit (INDEPENDENT_AMBULATORY_CARE_PROVIDER_SITE_OTHER): Payer: Medicare Other

## 2015-07-16 DIAGNOSIS — Z79890 Hormone replacement therapy (postmenopausal): Secondary | ICD-10-CM

## 2015-07-16 DIAGNOSIS — M8580 Other specified disorders of bone density and structure, unspecified site: Secondary | ICD-10-CM

## 2015-07-16 DIAGNOSIS — M899 Disorder of bone, unspecified: Secondary | ICD-10-CM | POA: Diagnosis not present

## 2015-07-22 ENCOUNTER — Ambulatory Visit: Payer: Medicare Other

## 2015-07-22 DIAGNOSIS — M25562 Pain in left knee: Principal | ICD-10-CM

## 2015-07-22 DIAGNOSIS — M25561 Pain in right knee: Secondary | ICD-10-CM | POA: Diagnosis not present

## 2015-07-22 DIAGNOSIS — R262 Difficulty in walking, not elsewhere classified: Secondary | ICD-10-CM

## 2015-07-22 NOTE — Therapy (Addendum)
[facility] [address] [place], [place], [address] Phone: [phone]   Fax:  [phone]  Physical Therapy Treatment  Patient Details  Name: [name] MRN: [id number] Date of Birth: [birth year] Referring Provider: Hulan Saas, MD  Encounter Date: 07/22/2015      PT End of Session - 07/22/15 1217    Visit Number 9   Number of Visits 10   Date for PT Re-Evaluation 08/12/15   PT Start Time 0867   PT Stop Time 1222   PT Time Calculation (min) 39 min   Activity Tolerance Patient tolerated treatment well   Behavior During Therapy [facility] for tasks assessed/performed      Past Medical History  Diagnosis Date  . Anxiety   . Hyperlipemia   . LBP (low back pain)   . Osteoarthritis   . Vitamin D deficiency   . Vitamin B deficiency   . Degenerative disk disease   . Diverticulosis   . PONV (postoperative nausea and vomiting)   . Hypertension     not treated  . Chronic kidney disease 1967    hx kidney infection    Past Surgical History  Procedure Laterality Date  . 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 Cervical fusion  1999 & 2009    C3-4 Elsner  . Cataract surgery      left  . Joint replacement    . Eye surgery Left 2012    cataract  . Spine surgery  1999&2009  . Tonsillectomy    . Appendectomy    . Breast surgery  2000    breast reduction... BREAST BIOPSY ON OCTOBER 2014  . Breast biopsy Left 06/03/2013    Procedure: BREAST BIOPSY WITH NEEDLE LOCALIZATION;  Surgeon: [name], MD;  Location: [facility];  Service: General;  Laterality: Left;  . Abdominal hysterectomy  2005    SUPRACERVICAL HYSTERECTOMY    There were no vitals filed for this visit.  Visit Diagnosis:  Arthralgia of both knees  Difficulty walking up stairs      Subjective Assessment - 07/22/15 1148    Subjective Will leave for [place] on Friday and has been running around a lot.     Currently in  Pain? No/denies  Last night: aching in knees.                           [place] Adult PT Treatment/Exercise - 07/22/15 0001    Knee/Hip Exercises: Aerobic   Stationary Bike level 2 x 8 min   Knee/Hip Exercises: Standing   Hip Abduction 2 sets;10 reps;Stengthening;Both  pain on Rt with standing on Rt   Hip Extension Stengthening;Both;2 sets;10 reps   Rocker Board 3 minutes   Rebounder weight shifting 3 ways x 1 minutes each   Walking with Sports Cord 15# forward and reverse x 10 each   Knee/Hip Exercises: Seated   Long Arc Quad Both;2 sets;10 reps  5sec holds, with 2# on left no weight on the right                  PT Short Term Goals - 07/01/15 1156    PT SHORT TERM GOAL #1   Title pt will be independent with initial HEP   Status Achieved           PT Long Term Goals - 07/22/15 1156    PT LONG  TERM GOAL #2   Title pt will demonstrate a 4" step up painfree bilaterally   Time 6   Period Weeks   Status On-going   PT LONG TERM GOAL #3   Title pt will be independent with final HEP   Time 6   Period Weeks   Status On-going   PT LONG TERM GOAL #4   Title pt will improve FOTO to 48% or less   Time 6   Period Weeks   Status On-going               Plan - 07/22/15 1157    Clinical Impression Statement Pt reports 50% reduction in bil knee symptoms since the start of care.  Pt is independent in current HEP for strength and flexibility but has been busy and hasn't had time to do the exercises.  Pt reports 7/10 Rt knee pain with steps and curbs.  Pt is using step-to gait with steps and curbs due to pain.  Pt with continued weakness in bil. hip and knee and will benefit from skilled PT for strength, flexibility and proression and pain management as needed.     Pt will benefit from skilled therapeutic intervention in order to improve on the following deficits Decreased activity tolerance;Decreased strength;Difficulty walking;Pain   Rehab Potential  Good   PT Frequency 2x / week   PT Duration 6 weeks   PT Treatment/Interventions Cryotherapy;English as a second language teacher;Therapeutic activities;Therapeutic exercise;Balance training;Neuromuscular re-education;Manual techniques;Taping;Patient/family education   PT Next Visit Plan cont hip and knee strengthening, proprioception. ERO with probable discharge next session (08/12/15)   Consulted and Agree with Plan of Care Patient        Problem List Patient Active Problem List   Diagnosis Date Noted  . Conjunctivitis 06/12/2015  . Insomnia 02/09/2015  . Osteoarthritis of both knees 02/09/2015  . Hemorrhoid 02/09/2015  . Memory impairment 10/20/2014  . Abdominal pain 10/20/2014  . B12 deficiency 11/13/2013  . Right maxillary sinusitis 10/28/2013  . Chest pain 07/17/2013  . Situational mixed anxiety and depressive disorder 10/28/2012  . Vulvar lesion 05/16/2012  . Vaginal atrophy 04/26/2011  . Post-menopausal 04/26/2011  . PARESTHESIA 09/28/2010  . OBESITY 03/15/2010  . HIP PAIN 03/15/2010  . ALOPECIA 12/08/2009  . SINUSITIS, ACUTE 07/20/2009  . TOBACCO USE, QUIT 06/12/2009  . INGUINAL PAIN, RIGHT 08/12/2008  . UPPER RESPIRATORY INFECTION (URI) 09/19/2007  . NECK PAIN 06/06/2007  . Vitamin D deficiency 05/02/2007  . HYPERLIPIDEMIA 05/02/2007  . ANEMIA, VITAMIN B12 DEFICIENCY NEC 05/02/2007  . Anxiety state 05/02/2007  . OSTEOARTHRITIS 05/02/2007  . LOW BACK PAIN 05/02/2007    TAKACS,KELLY, PT 07/22/2015, 12:23 PM PHYSICAL THERAPY DISCHARGE SUMMARY  Visits from Start of Care: 9  Current functional level related to goals / functional outcomes: Pt reports 50% overall improvement in symptoms since the start of care.     Remaining deficits: Pt with continued Rt knee pain that limits function especially with negotiating steps and curbs.  Pt has HEP in place for continued flexibility and strength.     Education /  Equipment: HEP Plan: Patient agrees to discharge.  Patient goals were partially met. Patient is being discharged due to being pleased with the current functional level.  ?????   Sigurd Sos, PT 08/12/2015 12:39 PM [facility] [address] [place], [place], [address] Phone: [phone]   Fax:  [phone]  Name: [name] MRN: [id number] Date of Birth: 1941-04-22

## 2015-08-11 ENCOUNTER — Ambulatory Visit: Payer: Self-pay

## 2015-08-31 ENCOUNTER — Other Ambulatory Visit: Payer: Self-pay | Admitting: Internal Medicine

## 2015-09-04 NOTE — Telephone Encounter (Signed)
erx sent

## 2015-09-28 ENCOUNTER — Ambulatory Visit (INDEPENDENT_AMBULATORY_CARE_PROVIDER_SITE_OTHER): Payer: Medicare Other | Admitting: Internal Medicine

## 2015-09-28 ENCOUNTER — Encounter: Payer: Self-pay | Admitting: Internal Medicine

## 2015-09-28 VITALS — BP 128/78 | HR 113 | Wt 196.0 lb

## 2015-09-28 DIAGNOSIS — F411 Generalized anxiety disorder: Secondary | ICD-10-CM

## 2015-09-28 DIAGNOSIS — E559 Vitamin D deficiency, unspecified: Secondary | ICD-10-CM

## 2015-09-28 DIAGNOSIS — Z0000 Encounter for general adult medical examination without abnormal findings: Secondary | ICD-10-CM

## 2015-09-28 DIAGNOSIS — E538 Deficiency of other specified B group vitamins: Secondary | ICD-10-CM | POA: Diagnosis not present

## 2015-09-28 DIAGNOSIS — D518 Other vitamin B12 deficiency anemias: Secondary | ICD-10-CM

## 2015-09-28 DIAGNOSIS — M5440 Lumbago with sciatica, unspecified side: Secondary | ICD-10-CM

## 2015-09-28 DIAGNOSIS — G4700 Insomnia, unspecified: Secondary | ICD-10-CM

## 2015-09-28 MED ORDER — DIAZEPAM 5 MG PO TABS: 5.0000 mg | ORAL_TABLET | Freq: Two times a day (BID) | ORAL | Status: DC | PRN

## 2015-09-28 MED ORDER — AMOXICILLIN 500 MG PO CAPS: ORAL_CAPSULE | ORAL | Status: DC

## 2015-09-28 MED ORDER — ZOLPIDEM TARTRATE 10 MG PO TABS: 5.0000 mg | ORAL_TABLET | Freq: Every evening | ORAL | Status: DC | PRN

## 2015-09-28 NOTE — Assessment & Plan Note (Signed)
Labs

## 2015-09-28 NOTE — Assessment & Plan Note (Signed)
2/17 Fresh air, start exercising, start walking

## 2015-09-28 NOTE — Progress Notes (Signed)
Subjective:  Patient ID: [name], female    DOB: 1941-05-14  Age: 75 y.o. MRN: [id number]  CC: No chief complaint on file.   HPI [name] presents for LBP, HTN, anxiety f/u. Pt needs Amox Rx. Pt is here for medicare wellness  Outpatient Prescriptions Prior to Visit  Medication Sig Dispense Refill  . Acetaminophen (TYLENOL ARTHRITIS PAIN PO) Take by mouth.    . Ascorbic Acid (VITAMIN C PO) Take by mouth daily.    . Biotin 1000 MCG tablet Take 2,000 mcg by mouth 3 (three) times daily.    . Cholecalciferol (VITAMIN D3) 1000 UNITS tablet Take 2,000 Units by mouth daily.      . clobetasol cream (TEMOVATE) [id number] % Apply 1 application topically 2 (two) times a week.    . ergocalciferol (VITAMIN D2) 50000 UNITS capsule Take 1 capsule (50,000 Units total) by mouth once a week. 6 capsule 0  . erythromycin ophthalmic ointment Place 1 application into both eyes 2 (two) times daily. 3.5 g 0  . estradiol (ESTRACE) 0.1 MG/GM vaginal cream Place [id number] Applicatorfuls vaginally 2 (two) times a week. 42.5 g 8  . HYDROcodone-acetaminophen (NORCO) 5-325 MG per tablet Take 1 tablet by mouth every 4 (four) hours as needed. 30 tablet 0  . NONFORMULARY OR COMPOUNDED ITEM Estradiol vaginal cream 0.02% S: insert appful twice weekly 24 each 3  . Phosphatidylserine-DHA-EPA (VAYACOG) 100-19.5-6.5 MG CAPS Take 1 capsule by mouth daily. 30 capsule 11  . vitamin B-12 (CYANOCOBALAMIN) 1000 MCG tablet Take 1,000 mcg by mouth daily.      Marland Kitchen amoxicillin (AMOXIL) 500 MG capsule TAKE 4 CAPSULES (2,000 MG TOTAL) BY MOUTH DAILY. FOR DENTAL VISITS. 20 capsule 2  . diazepam (VALIUM) 5 MG tablet Take 1 tablet (5 mg total) by mouth 2 (two) times daily as needed for anxiety or muscle spasms. 60 tablet 2  . zolpidem (AMBIEN) 10 MG tablet Take 0.5-1 tablets (5-10 mg total) by mouth at bedtime as needed for sleep. 30 tablet 2   No facility-administered medications prior to visit.    ROS Review of Systems    Constitutional: Negative for chills, activity change, appetite change, fatigue and unexpected weight change.  HENT: Negative for congestion, mouth sores and sinus pressure.   Eyes: Negative for visual disturbance.  Respiratory: Negative for cough and chest tightness.   Cardiovascular: Negative for leg swelling.  Gastrointestinal: Negative for nausea and abdominal pain.  Genitourinary: Negative for frequency, difficulty urinating and vaginal pain.  Musculoskeletal: Positive for back pain and gait problem.  Skin: Negative for pallor and rash.  Neurological: Negative for dizziness, tremors, weakness, numbness and headaches.  Psychiatric/Behavioral: Negative for suicidal ideas, confusion and sleep disturbance.    Objective:  BP 128/78 mmHg  Pulse 113  Wt 196 lb (88.905 kg)  SpO2 97%  BP Readings from Last 3 Encounters:  09/28/15 128/78  06/16/15 144/90  06/12/15 140/70    Wt Readings from Last 3 Encounters:  09/28/15 196 lb (88.905 kg)  06/16/15 199 lb (90.266 kg)  06/12/15 198 lb (89.812 kg)    Physical Exam  Constitutional: She appears well-developed. No distress.  HENT:  Head: Normocephalic.  Right Ear: External ear normal.  Left Ear: External ear normal.  Nose: Nose normal.  Mouth/Throat: Oropharynx is clear and moist.  Eyes: Conjunctivae are normal. Pupils are equal, round, and reactive to light. Right eye exhibits no discharge. Left eye exhibits no discharge.  Neck: Normal range of motion. Neck supple. No JVD  present. No tracheal deviation present. No thyromegaly present.  Cardiovascular: Normal rate, regular rhythm and normal heart sounds.   Pulmonary/Chest: No stridor. No respiratory distress. She has no wheezes.  Abdominal: Soft. Bowel sounds are normal. She exhibits no distension and no mass. There is no tenderness. There is no rebound and no guarding.  Musculoskeletal: She exhibits no edema or tenderness.  Lymphadenopathy:    She has no cervical adenopathy.   Neurological: She displays normal reflexes. No cranial nerve deficit. She exhibits normal muscle tone. Coordination normal.  Skin: No rash noted. No erythema.  Psychiatric: She has a normal mood and affect. Her behavior is normal. Judgment and thought content normal.    Lab Results  Component Value Date   WBC 8.1 10/20/2014   HGB 14.9 10/20/2014   HCT 43.7 10/20/2014   PLT 310.0 10/20/2014   GLUCOSE 94 10/20/2014   CHOL 221* 10/20/2014   TRIG 140.0 10/20/2014   HDL 54.00 10/20/2014   LDLDIRECT 161.3 06/17/2011   LDLCALC 139* 10/20/2014   ALT 18 10/20/2014   AST 17 10/20/2014   NA 138 10/20/2014   K 4.2 10/20/2014   CL 105 10/20/2014   CREATININE 0.68 10/20/2014   BUN 18 10/20/2014   CO2 27 10/20/2014   TSH 2.10 10/20/2014   INR 1.76* 08/29/2009    Dg Knee Bilateral Standing Ap  02/25/2015  CLINICAL DATA:  Bilateral knee pain, no acute injury EXAM: BILATERAL KNEES STANDING - 1 VIEW COMPARISON:  None. FINDINGS: Standing frontal views of the knees show very little degenerative joint disease for age. There may be slight loss of medial joint spaces with minimal spurring present. No acute abnormality is seen. 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 No acute abnormality. Electronically Signed   By: Ivar Drape M.D.   On: 02/25/2015 14:35    Assessment & Plan:   Diagnoses and all orders for this visit:  B12 deficiency  Vitamin D deficiency  Anxiety state  Acute midline low back pain with sciatica, sciatica laterality unspecified  ANEMIA, VITAMIN B12 DEFICIENCY NEC  Well adult exam  Other orders -     amoxicillin (AMOXIL) 500 MG capsule; TAKE 4 CAPSULES (2,000 MG TOTAL) BY MOUTH DAILY. FOR DENTAL VISITS. -     diazepam (VALIUM) 5 MG tablet; Take 1 tablet (5 mg total) by mouth 2 (two) times daily as needed for anxiety or muscle spasms. -     zolpidem (AMBIEN) 10 MG tablet; Take 0.5-1 tablets (5-10 mg total) by mouth at  bedtime as needed for sleep.  I am having Ms. Piazza maintain her vitamin B-12, cholecalciferol, Biotin, clobetasol cream, Ascorbic Acid (VITAMIN C PO), HYDROcodone-acetaminophen, Phosphatidylserine-DHA-EPA, Acetaminophen (TYLENOL ARTHRITIS PAIN PO), estradiol, NONFORMULARY OR COMPOUNDED ITEM, ergocalciferol, erythromycin, amoxicillin, diazepam, and zolpidem.  Meds ordered this encounter  Medications  . amoxicillin (AMOXIL) 500 MG capsule    Sig: TAKE 4 CAPSULES (2,000 MG TOTAL) BY MOUTH DAILY. FOR DENTAL VISITS.    Dispense:  20 capsule    Refill:  2  . diazepam (VALIUM) 5 MG tablet    Sig: Take 1 tablet (5 mg total) by mouth 2 (two) times daily as needed for anxiety or muscle spasms.    Dispense:  60 tablet    Refill:  2  . zolpidem (AMBIEN) 10 MG tablet    Sig: Take 0.5-1 tablets (5-10 mg total) by mouth at bedtime as needed for sleep.    Dispense:  30 tablet  Refill:  2     Follow-up: Return in about 4 months (around 01/26/2016) for a follow-up visit.  [name], MD

## 2015-09-28 NOTE — Assessment & Plan Note (Signed)
On B12 

## 2015-09-28 NOTE — Assessment & Plan Note (Addendum)
  Here for medicare wellness/physical  Diet: heart healthy  Physical activity: sedentary  Depression/mood screen: negative  Hearing: intact to whispered voice  Visual acuity: grossly normal, performs annual eye exam  ADLs: capable  Fall risk: low  Home safety: good  Cognitive evaluation: intact to orientation, naming, recall and repetition  EOL planning: adv directives, full code/ I agree  I have personally reviewed and have noted  1. The patient's medical, surgical and social history  2. Their use of alcohol, tobacco or illicit drugs  3. Their current medications and supplements  4. The patient's functional ability including ADL's, fall risks, home safety risks and hearing or visual impairment.  5. Diet and physical activities  6. Evidence for depression or mood disorders:stress at home 7. The roster of all physicians providing medical care to patient - is listed in the Snapshot section of the chart and reviewed today.    Today patient counseled on age appropriate routine health concerns for screening and prevention, each reviewed and up to date or declined. Immunizations reviewed and up to date or declined. Labs ordered and reviewed. Risk factors for depression reviewed and negative. Hearing function and visual acuity are intact. ADLs screened and addressed as needed. Functional ability and level of safety reviewed and appropriate. Education, counseling and referrals performed based on assessed risks today. Patient provided with a copy of personalized plan for preventive services.   2/17 Fresh air, start exercising, start walking

## 2015-09-28 NOTE — Patient Instructions (Signed)
Preventive Care for Adults, Female A healthy lifestyle and preventive care can promote health and wellness. Preventive health guidelines for women include the following key practices.  A routine yearly physical is a good way to check with your health care provider about your health and preventive screening. It is a chance to share any concerns and updates on your health and to receive a thorough exam.  Visit your dentist for a routine exam and preventive care every 6 months. Brush your teeth twice a day and floss once a day. Good oral hygiene prevents tooth decay and gum disease.  The frequency of eye exams is based on your age, health, family medical history, use of contact lenses, and other factors. Follow your health care provider's recommendations for frequency of eye exams.  Eat a healthy diet. Foods like vegetables, fruits, whole grains, low-fat dairy products, and lean protein foods contain the nutrients you need without too many calories. Decrease your intake of foods high in solid fats, added sugars, and salt. Eat the right amount of calories for you.Get information about a proper diet from your health care provider, if necessary.  Regular physical exercise is one of the most important things you can do for your health. Most adults should get at least 150 minutes of moderate-intensity exercise (any activity that increases your heart rate and causes you to sweat) each week. In addition, most adults need muscle-strengthening exercises on 2 or more days a week.  Maintain a healthy weight. The body mass index (BMI) is a screening tool to identify possible weight problems. It provides an estimate of body fat based on height and weight. Your health care provider can find your BMI and can help you achieve or maintain a healthy weight.For adults 20 years and older:  A BMI below 18.5 is considered underweight.  A BMI of 18.5 to 24.9 is normal.  A BMI of 25 to 29.9 is considered overweight.  A  BMI of 30 and above is considered obese.  Maintain normal blood lipids and cholesterol levels by exercising and minimizing your intake of saturated fat. Eat a balanced diet with plenty of fruit and vegetables. Blood tests for lipids and cholesterol should begin at age 45 and be repeated every 5 years. If your lipid or cholesterol levels are high, you are over 50, or you are at high risk for heart disease, you may need your cholesterol levels checked more frequently.Ongoing high lipid and cholesterol levels should be treated with medicines if diet and exercise are not working.  If you smoke, find out from your health care provider how to quit. If you do not use tobacco, do not start.  Lung cancer screening is recommended for adults aged 45-80 years who are at high risk for developing lung cancer because of a history of smoking. A yearly low-dose CT scan of the lungs is recommended for people who have at least a 30-pack-year history of smoking and are a current smoker or have quit within the past 15 years. A pack year of smoking is smoking an average of 1 pack of cigarettes a day for 1 year (for example: 1 pack a day for 30 years or 2 packs a day for 15 years). Yearly screening should continue until the smoker has stopped smoking for at least 15 years. Yearly screening should be stopped for people who develop a health problem that would prevent them from having lung cancer treatment.  If you are pregnant, do not drink alcohol. If you are  breastfeeding, be very cautious about drinking alcohol. If you are not pregnant and choose to drink alcohol, do not have more than 1 drink per day. One drink is considered to be 12 ounces (355 mL) of beer, 5 ounces (148 mL) of wine, or 1.5 ounces (44 mL) of liquor.  Avoid use of street drugs. Do not share needles with anyone. Ask for help if you need support or instructions about stopping the use of drugs.  High blood pressure causes heart disease and increases the risk  of stroke. Your blood pressure should be checked at least every 1 to 2 years. Ongoing high blood pressure should be treated with medicines if weight loss and exercise do not work.  If you are 55-79 years old, ask your health care provider if you should take aspirin to prevent strokes.  Diabetes screening is done by taking a blood sample to check your blood glucose level after you have not eaten for a certain period of time (fasting). If you are not overweight and you do not have risk factors for diabetes, you should be screened once every 3 years starting at age 45. If you are overweight or obese and you are 40-70 years of age, you should be screened for diabetes every year as part of your cardiovascular risk assessment.  Breast cancer screening is essential preventive care for women. You should practice "breast self-awareness." This means understanding the normal appearance and feel of your breasts and may include breast self-examination. Any changes detected, no matter how small, should be reported to a health care provider. Women in their 20s and 30s should have a clinical breast exam (CBE) by a health care provider as part of a regular health exam every 1 to 3 years. After age 40, women should have a CBE every year. Starting at age 40, women should consider having a mammogram (breast X-ray test) every year. Women who have a family history of breast cancer should talk to their health care provider about genetic screening. Women at a high risk of breast cancer should talk to their health care providers about having an MRI and a mammogram every year.  Breast cancer gene (BRCA)-related cancer risk assessment is recommended for women who have family members with BRCA-related cancers. BRCA-related cancers include breast, ovarian, tubal, and peritoneal cancers. Having family members with these cancers may be associated with an increased risk for harmful changes (mutations) in the breast cancer genes BRCA1 and  BRCA2. Results of the assessment will determine the need for genetic counseling and BRCA1 and BRCA2 testing.  Your health care provider may recommend that you be screened regularly for cancer of the pelvic organs (ovaries, uterus, and vagina). This screening involves a pelvic examination, including checking for microscopic changes to the surface of your cervix (Pap test). You may be encouraged to have this screening done every 3 years, beginning at age 21.  For women ages 30-65, health care providers may recommend pelvic exams and Pap testing every 3 years, or they may recommend the Pap and pelvic exam, combined with testing for human papilloma virus (HPV), every 5 years. Some types of HPV increase your risk of cervical cancer. Testing for HPV may also be done on women of any age with unclear Pap test results.  Other health care providers may not recommend any screening for nonpregnant women who are considered low risk for pelvic cancer and who do not have symptoms. Ask your health care provider if a screening pelvic exam is right for   you.  If you have had past treatment for cervical cancer or a condition that could lead to cancer, you need Pap tests and screening for cancer for at least 20 years after your treatment. If Pap tests have been discontinued, your risk factors (such as having a new sexual partner) need to be reassessed to determine if screening should resume. Some women have medical problems that increase the chance of getting cervical cancer. In these cases, your health care provider may recommend more frequent screening and Pap tests.  Colorectal cancer can be detected and often prevented. Most routine colorectal cancer screening begins at the age of 50 years and continues through age 75 years. However, your health care provider may recommend screening at an earlier age if you have risk factors for colon cancer. On a yearly basis, your health care provider may provide home test kits to check  for hidden blood in the stool. Use of a small camera at the end of a tube, to directly examine the colon (sigmoidoscopy or colonoscopy), can detect the earliest forms of colorectal cancer. Talk to your health care provider about this at age 50, when routine screening begins. Direct exam of the colon should be repeated every 5-10 years through age 75 years, unless early forms of precancerous polyps or small growths are found.  People who are at an increased risk for hepatitis B should be screened for this virus. You are considered at high risk for hepatitis B if:  You were born in a country where hepatitis B occurs often. Talk with your health care provider about which countries are considered high risk.  Your parents were born in a high-risk country and you have not received a shot to protect against hepatitis B (hepatitis B vaccine).  You have HIV or AIDS.  You use needles to inject street drugs.  You live with, or have sex with, someone who has hepatitis B.  You get hemodialysis treatment.  You take certain medicines for conditions like cancer, organ transplantation, and autoimmune conditions.  Hepatitis C blood testing is recommended for all people born from 1945 through 1965 and any individual with known risks for hepatitis C.  Practice safe sex. Use condoms and avoid high-risk sexual practices to reduce the spread of sexually transmitted infections (STIs). STIs include gonorrhea, chlamydia, syphilis, trichomonas, herpes, HPV, and human immunodeficiency virus (HIV). Herpes, HIV, and HPV are viral illnesses that have no cure. They can result in disability, cancer, and death.  You should be screened for sexually transmitted illnesses (STIs) including gonorrhea and chlamydia if:  You are sexually active and are younger than 24 years.  You are older than 24 years and your health care provider tells you that you are at risk for this type of infection.  Your sexual activity has changed  since you were last screened and you are at an increased risk for chlamydia or gonorrhea. Ask your health care provider if you are at risk.  If you are at risk of being infected with HIV, it is recommended that you take a prescription medicine daily to prevent HIV infection. This is called preexposure prophylaxis (PrEP). You are considered at risk if:  You are sexually active and do not regularly use condoms or know the HIV status of your partner(s).  You take drugs by injection.  You are sexually active with a partner who has HIV.  Talk with your health care provider about whether you are at high risk of being infected with HIV. If   you choose to begin PrEP, you should first be tested for HIV. You should then be tested every 3 months for as long as you are taking PrEP.  Osteoporosis is a disease in which the bones lose minerals and strength with aging. This can result in serious bone fractures or breaks. The risk of osteoporosis can be identified using a bone density scan. Women ages 67 years and over and women at risk for fractures or osteoporosis should discuss screening with their health care providers. Ask your health care provider whether you should take a calcium supplement or vitamin D to reduce the rate of osteoporosis.  Menopause can be associated with physical symptoms and risks. Hormone replacement therapy is available to decrease symptoms and risks. You should talk to your health care provider about whether hormone replacement therapy is right for you.  Use sunscreen. Apply sunscreen liberally and repeatedly throughout the day. You should seek shade when your shadow is shorter than you. Protect yourself by wearing long sleeves, pants, a wide-brimmed hat, and sunglasses year round, whenever you are outdoors.  Once a month, do a whole body skin exam, using a mirror to look at the skin on your back. Tell your health care provider of new moles, moles that have irregular borders, moles that  are larger than a pencil eraser, or moles that have changed in shape or color.  Stay current with required vaccines (immunizations).  Influenza vaccine. All adults should be immunized every year.  Tetanus, diphtheria, and acellular pertussis (Td, Tdap) vaccine. Pregnant women should receive 1 dose of Tdap vaccine during each pregnancy. The dose should be obtained regardless of the length of time since the last dose. Immunization is preferred during the 27th-36th week of gestation. An adult who has not previously received Tdap or who does not know her vaccine status should receive 1 dose of Tdap. This initial dose should be followed by tetanus and diphtheria toxoids (Td) booster doses every 10 years. Adults with an unknown or incomplete history of completing a 3-dose immunization series with Td-containing vaccines should begin or complete a primary immunization series including a Tdap dose. Adults should receive a Td booster every 10 years.  Varicella vaccine. An adult without evidence of immunity to varicella should receive 2 doses or a second dose if she has previously received 1 dose. Pregnant females who do not have evidence of immunity should receive the first dose after pregnancy. This first dose should be obtained before leaving the health care facility. The second dose should be obtained 4-8 weeks after the first dose.  Human papillomavirus (HPV) vaccine. Females aged 13-26 years who have not received the vaccine previously should obtain the 3-dose series. The vaccine is not recommended for use in pregnant females. However, pregnancy testing is not needed before receiving a dose. If a female is found to be pregnant after receiving a dose, no treatment is needed. In that case, the remaining doses should be delayed until after the pregnancy. Immunization is recommended for any person with an immunocompromised condition through the age of 61 years if she did not get any or all doses earlier. During the  3-dose series, the second dose should be obtained 4-8 weeks after the first dose. The third dose should be obtained 24 weeks after the first dose and 16 weeks after the second dose.  Zoster vaccine. One dose is recommended for adults aged 30 years or older unless certain conditions are present.  Measles, mumps, and rubella (MMR) vaccine. Adults born  before 1957 generally are considered immune to measles and mumps. Adults born in 1957 or later should have 1 or more doses of MMR vaccine unless there is a contraindication to the vaccine or there is laboratory evidence of immunity to each of the three diseases. A routine second dose of MMR vaccine should be obtained at least 28 days after the first dose for students attending postsecondary schools, health care workers, or international travelers. People who received inactivated measles vaccine or an unknown type of measles vaccine during 1963-1967 should receive 2 doses of MMR vaccine. People who received inactivated mumps vaccine or an unknown type of mumps vaccine before 1979 and are at high risk for mumps infection should consider immunization with 2 doses of MMR vaccine. For females of childbearing age, rubella immunity should be determined. If there is no evidence of immunity, females who are not pregnant should be vaccinated. If there is no evidence of immunity, females who are pregnant should delay immunization until after pregnancy. Unvaccinated health care workers born before 1957 who lack laboratory evidence of measles, mumps, or rubella immunity or laboratory confirmation of disease should consider measles and mumps immunization with 2 doses of MMR vaccine or rubella immunization with 1 dose of MMR vaccine.  Pneumococcal 13-valent conjugate (PCV13) vaccine. When indicated, a person who is uncertain of his immunization history and has no record of immunization should receive the PCV13 vaccine. All adults 65 years of age and older should receive this  vaccine. An adult aged 19 years or older who has certain medical conditions and has not been previously immunized should receive 1 dose of PCV13 vaccine. This PCV13 should be followed with a dose of pneumococcal polysaccharide (PPSV23) vaccine. Adults who are at high risk for pneumococcal disease should obtain the PPSV23 vaccine at least 8 weeks after the dose of PCV13 vaccine. Adults older than 76 years of age who have normal immune system function should obtain the PPSV23 vaccine dose at least 1 year after the dose of PCV13 vaccine.  Pneumococcal polysaccharide (PPSV23) vaccine. When PCV13 is also indicated, PCV13 should be obtained first. All adults aged 65 years and older should be immunized. An adult younger than age 65 years who has certain medical conditions should be immunized. Any person who resides in a nursing home or long-term care facility should be immunized. An adult smoker should be immunized. People with an immunocompromised condition and certain other conditions should receive both PCV13 and PPSV23 vaccines. People with human immunodeficiency virus (HIV) infection should be immunized as soon as possible after diagnosis. Immunization during chemotherapy or radiation therapy should be avoided. Routine use of PPSV23 vaccine is not recommended for American Indians, [place] Natives, or people younger than 65 years unless there are medical conditions that require PPSV23 vaccine. When indicated, people who have unknown immunization and have no record of immunization should receive PPSV23 vaccine. One-time revaccination 5 years after the first dose of PPSV23 is recommended for people aged 19-64 years who have chronic kidney failure, nephrotic syndrome, asplenia, or immunocompromised conditions. People who received 1-2 doses of PPSV23 before age 65 years should receive another dose of PPSV23 vaccine at age 65 years or later if at least 5 years have passed since the previous dose. Doses of PPSV23 are not  needed for people immunized with PPSV23 at or after age 65 years.  Meningococcal vaccine. Adults with asplenia or persistent complement component deficiencies should receive 2 doses of quadrivalent meningococcal conjugate (MenACWY-D) vaccine. The doses should be obtained   at least 2 months apart. Microbiologists working with certain meningococcal bacteria, [place] recruits, people at risk during an outbreak, and people who travel to or live in countries with a high rate of meningitis should be immunized. A first-year college student up through age 34 years who is living in a residence hall should receive a dose if she did not receive a dose on or after her 16th birthday. Adults who have certain high-risk conditions should receive one or more doses of vaccine.  Hepatitis A vaccine. Adults who wish to be protected from this disease, have certain high-risk conditions, work with hepatitis A-infected animals, work in hepatitis A research labs, or travel to or work in countries with a high rate of hepatitis A should be immunized. Adults who were previously unvaccinated and who anticipate close contact with an international adoptee during the first 60 days after arrival in the [place] from a country with a high rate of hepatitis A should be immunized.  Hepatitis B vaccine. Adults who wish to be protected from this disease, have certain high-risk conditions, may be exposed to blood or other infectious body fluids, are household contacts or sex partners of hepatitis B positive people, are clients or workers in certain care facilities, or travel to or work in countries with a high rate of hepatitis B should be immunized.  Haemophilus influenzae type b (Hib) vaccine. A previously unvaccinated person with asplenia or sickle cell disease or having a scheduled splenectomy should receive 1 dose of Hib vaccine. Regardless of previous immunization, a recipient of a hematopoietic stem cell transplant should receive a  3-dose series 6-12 months after her successful transplant. Hib vaccine is not recommended for adults with HIV infection. Preventive Services / Frequency Ages 35 to 4 years  Blood pressure check.** / Every 3-5 years.  Lipid and cholesterol check.** / Every 5 years beginning at age 60.  Clinical breast exam.** / Every 3 years for women in their 71s and 10s.  BRCA-related cancer risk assessment.** / For women who have family members with a BRCA-related cancer (breast, ovarian, tubal, or peritoneal cancers).  Pap test.** / Every 2 years from ages 76 through 26. Every 3 years starting at age 61 through age 76 or 93 with a history of 3 consecutive normal Pap tests.  HPV screening.** / Every 3 years from ages 37 through ages 60 to 51 with a history of 3 consecutive normal Pap tests.  Hepatitis C blood test.** / For any individual with known risks for hepatitis C.  Skin self-exam. / Monthly.  Influenza vaccine. / Every year.  Tetanus, diphtheria, and acellular pertussis (Tdap, Td) vaccine.** / Consult your health care provider. Pregnant women should receive 1 dose of Tdap vaccine during each pregnancy. 1 dose of Td every 10 years.  Varicella vaccine.** / Consult your health care provider. Pregnant females who do not have evidence of immunity should receive the first dose after pregnancy.  HPV vaccine. / 3 doses over 6 months, if 93 and younger. The vaccine is not recommended for use in pregnant females. However, pregnancy testing is not needed before receiving a dose.  Measles, mumps, rubella (MMR) vaccine.** / You need at least 1 dose of MMR if you were born in 1957 or later. You may also need a 2nd dose. For females of childbearing age, rubella immunity should be determined. If there is no evidence of immunity, females who are not pregnant should be vaccinated. If there is no evidence of immunity, females who are  pregnant should delay immunization until after pregnancy.  Pneumococcal  13-valent conjugate (PCV13) vaccine.** / Consult your health care provider.  Pneumococcal polysaccharide (PPSV23) vaccine.** / 1 to 2 doses if you smoke cigarettes or if you have certain conditions.  Meningococcal vaccine.** / 1 dose if you are age 68 to 8 years and a Market researcher living in a residence hall, or have one of several medical conditions, you need to get vaccinated against meningococcal disease. You may also need additional booster doses.  Hepatitis A vaccine.** / Consult your health care provider.  Hepatitis B vaccine.** / Consult your health care provider.  Haemophilus influenzae type b (Hib) vaccine.** / Consult your health care provider. Ages 7 to 53 years  Blood pressure check.** / Every year.  Lipid and cholesterol check.** / Every 5 years beginning at age 25 years.  Lung cancer screening. / Every year if you are aged 11-80 years and have a 30-pack-year history of smoking and currently smoke or have quit within the past 15 years. Yearly screening is stopped once you have quit smoking for at least 15 years or develop a health problem that would prevent you from having lung cancer treatment.  Clinical breast exam.** / Every year after age 48 years.  BRCA-related cancer risk assessment.** / For women who have family members with a BRCA-related cancer (breast, ovarian, tubal, or peritoneal cancers).  Mammogram.** / Every year beginning at age 41 years and continuing for as long as you are in good health. Consult with your health care provider.  Pap test.** / Every 3 years starting at age 65 years through age 37 or 70 years with a history of 3 consecutive normal Pap tests.  HPV screening.** / Every 3 years from ages 72 years through ages 60 to 40 years with a history of 3 consecutive normal Pap tests.  Fecal occult blood test (FOBT) of stool. / Every year beginning at age 21 years and continuing until age 5 years. You may not need to do this test if you get  a colonoscopy every 10 years.  Flexible sigmoidoscopy or colonoscopy.** / Every 5 years for a flexible sigmoidoscopy or every 10 years for a colonoscopy beginning at age 35 years and continuing until age 48 years.  Hepatitis C blood test.** / For all people born from [birth year] through 1965 and any individual with known risks for hepatitis C.  Skin self-exam. / Monthly.  Influenza vaccine. / Every year.  Tetanus, diphtheria, and acellular pertussis (Tdap/Td) vaccine.** / Consult your health care provider. Pregnant women should receive 1 dose of Tdap vaccine during each pregnancy. 1 dose of Td every 10 years.  Varicella vaccine.** / Consult your health care provider. Pregnant females who do not have evidence of immunity should receive the first dose after pregnancy.  Zoster vaccine.** / 1 dose for adults aged 30 years or older.  Measles, mumps, rubella (MMR) vaccine.** / You need at least 1 dose of MMR if you were born in 1957 or later. You may also need a second dose. For females of childbearing age, rubella immunity should be determined. If there is no evidence of immunity, females who are not pregnant should be vaccinated. If there is no evidence of immunity, females who are pregnant should delay immunization until after pregnancy.  Pneumococcal 13-valent conjugate (PCV13) vaccine.** / Consult your health care provider.  Pneumococcal polysaccharide (PPSV23) vaccine.** / 1 to 2 doses if you smoke cigarettes or if you have certain conditions.  Meningococcal vaccine.** /  Consult your health care provider.  Hepatitis A vaccine.** / Consult your health care provider.  Hepatitis B vaccine.** / Consult your health care provider.  Haemophilus influenzae type b (Hib) vaccine.** / Consult your health care provider. Ages 64 years and over  Blood pressure check.** / Every year.  Lipid and cholesterol check.** / Every 5 years beginning at age 23 years.  Lung cancer screening. / Every year if you  are aged 16-80 years and have a 30-pack-year history of smoking and currently smoke or have quit within the past 15 years. Yearly screening is stopped once you have quit smoking for at least 15 years or develop a health problem that would prevent you from having lung cancer treatment.  Clinical breast exam.** / Every year after age 74 years.  BRCA-related cancer risk assessment.** / For women who have family members with a BRCA-related cancer (breast, ovarian, tubal, or peritoneal cancers).  Mammogram.** / Every year beginning at age 44 years and continuing for as long as you are in good health. Consult with your health care provider.  Pap test.** / Every 3 years starting at age 58 years through age 22 or 39 years with 3 consecutive normal Pap tests. Testing can be stopped between 65 and 70 years with 3 consecutive normal Pap tests and no abnormal Pap or HPV tests in the past 10 years.  HPV screening.** / Every 3 years from ages 64 years through ages 70 or 61 years with a history of 3 consecutive normal Pap tests. Testing can be stopped between 65 and 70 years with 3 consecutive normal Pap tests and no abnormal Pap or HPV tests in the past 10 years.  Fecal occult blood test (FOBT) of stool. / Every year beginning at age 40 years and continuing until age 27 years. You may not need to do this test if you get a colonoscopy every 10 years.  Flexible sigmoidoscopy or colonoscopy.** / Every 5 years for a flexible sigmoidoscopy or every 10 years for a colonoscopy beginning at age 7 years and continuing until age 32 years.  Hepatitis C blood test.** / For all people born from [birth year] through 1966 and any individual with known risks for hepatitis C.  Osteoporosis screening.** / A one-time screening for women ages 30 years and over and women at risk for fractures or osteoporosis.  Skin self-exam. / Monthly.  Influenza vaccine. / Every year.  Tetanus, diphtheria, and acellular pertussis (Tdap/Td)  vaccine.** / 1 dose of Td every 10 years.  Varicella vaccine.** / Consult your health care provider.  Zoster vaccine.** / 1 dose for adults aged 35 years or older.  Pneumococcal 13-valent conjugate (PCV13) vaccine.** / Consult your health care provider.  Pneumococcal polysaccharide (PPSV23) vaccine.** / 1 dose for all adults aged 46 years and older.  Meningococcal vaccine.** / Consult your health care provider.  Hepatitis A vaccine.** / Consult your health care provider.  Hepatitis B vaccine.** / Consult your health care provider.  Haemophilus influenzae type b (Hib) vaccine.** / Consult your health care provider. ** Family history and personal history of risk and conditions may change your health care provider's recommendations.   This information is not intended to replace advice given to you by your health care provider. Make sure you discuss any questions you have with your health care provider.   Document Released: 09/13/2001 Document Revised: 08/08/2014 Document Reviewed: 12/13/2010 Elsevier Interactive Patient Education [employer].

## 2015-09-28 NOTE — Assessment & Plan Note (Signed)
Norco prn  Potential benefits of a long term opioids use as well as potential risks (i.e. addiction risk, apnea etc) and complications (i.e. Somnolence, constipation and others) were explained to the patient and were aknowledged. 

## 2015-09-28 NOTE — Assessment & Plan Note (Addendum)
Diazepam pr Pt declined anti-depressants  Potential benefits of a long term benzodiazepines  use as well as potential risks  and complications were explained to the patient and were aknowledged.

## 2015-09-28 NOTE — Progress Notes (Signed)
Pre visit review using our clinic review tool, if applicable. No additional management support is needed unless otherwise documented below in the visit note. 

## 2015-09-28 NOTE — Assessment & Plan Note (Signed)
Vit D 2000 iu/d 

## 2015-10-07 ENCOUNTER — Telehealth: Payer: Self-pay | Admitting: Internal Medicine

## 2015-10-07 NOTE — Telephone Encounter (Signed)
Pt called and said that she needs

## 2015-11-27 ENCOUNTER — Telehealth: Payer: Self-pay

## 2015-11-27 NOTE — Telephone Encounter (Signed)
Handicap placard application has been completed and placed in cabinet for pt pick up.  Pt advised of same

## 2016-01-26 ENCOUNTER — Ambulatory Visit (INDEPENDENT_AMBULATORY_CARE_PROVIDER_SITE_OTHER): Payer: Medicare Other | Admitting: Internal Medicine

## 2016-01-26 ENCOUNTER — Encounter: Payer: Self-pay | Admitting: Internal Medicine

## 2016-01-26 VITALS — BP 140/88 | HR 96 | Wt 198.0 lb

## 2016-01-26 DIAGNOSIS — D518 Other vitamin B12 deficiency anemias: Secondary | ICD-10-CM | POA: Diagnosis not present

## 2016-01-26 DIAGNOSIS — E538 Deficiency of other specified B group vitamins: Secondary | ICD-10-CM

## 2016-01-26 DIAGNOSIS — E669 Obesity, unspecified: Secondary | ICD-10-CM

## 2016-01-26 DIAGNOSIS — R413 Other amnesia: Secondary | ICD-10-CM

## 2016-01-26 DIAGNOSIS — F411 Generalized anxiety disorder: Secondary | ICD-10-CM

## 2016-01-26 DIAGNOSIS — M5440 Lumbago with sciatica, unspecified side: Secondary | ICD-10-CM

## 2016-01-26 DIAGNOSIS — G4700 Insomnia, unspecified: Secondary | ICD-10-CM

## 2016-01-26 DIAGNOSIS — E559 Vitamin D deficiency, unspecified: Secondary | ICD-10-CM

## 2016-01-26 MED ORDER — PHOSPHATIDYLSERINE-DHA-EPA 100-19.5-6.5 MG PO CAPS: 1.0000 | ORAL_CAPSULE | Freq: Every day | ORAL | Status: DC

## 2016-01-26 NOTE — Assessment & Plan Note (Signed)
Norco prn  Potential benefits of a long term opioids use as well as potential risks (i.e. addiction risk, apnea etc) and complications (i.e. Somnolence, constipation and others) were explained to the patient and were aknowledged. 

## 2016-01-26 NOTE — Progress Notes (Signed)
Subjective:  Patient ID: [name], female    DOB: 07/04/1941  Age: 75 y.o. MRN: [id number]  CC: No chief complaint on file.   HPI [name] presents for LBP, anxiety, B12 def f/u. C/o memory issues  Outpatient Prescriptions Prior to Visit  Medication Sig Dispense Refill  . Acetaminophen (TYLENOL ARTHRITIS PAIN PO) Take by mouth.    Marland Kitchen amoxicillin (AMOXIL) 500 MG capsule TAKE 4 CAPSULES (2,000 MG TOTAL) BY MOUTH DAILY. FOR DENTAL VISITS. 20 capsule 2  . Ascorbic Acid (VITAMIN C PO) Take by mouth daily.    . Biotin 1000 MCG tablet Take 2,000 mcg by mouth 3 (three) times daily.    . Cholecalciferol (VITAMIN D3) 1000 UNITS tablet Take 2,000 Units by mouth daily.      . clobetasol cream (TEMOVATE) [id number] % Apply 1 application topically 2 (two) times a week.    . diazepam (VALIUM) 5 MG tablet Take 1 tablet (5 mg total) by mouth 2 (two) times daily as needed for anxiety or muscle spasms. 60 tablet 2  . ergocalciferol (VITAMIN D2) 50000 UNITS capsule Take 1 capsule (50,000 Units total) by mouth once a week. 6 capsule 0  . erythromycin ophthalmic ointment Place 1 application into both eyes 2 (two) times daily. 3.5 g 0  . estradiol (ESTRACE) 0.1 MG/GM vaginal cream Place [id number] Applicatorfuls vaginally 2 (two) times a week. 42.5 g 8  . HYDROcodone-acetaminophen (NORCO) 5-325 MG per tablet Take 1 tablet by mouth every 4 (four) hours as needed. 30 tablet 0  . NONFORMULARY OR COMPOUNDED ITEM Estradiol vaginal cream 0.02% S: insert appful twice weekly 24 each 3  . Phosphatidylserine-DHA-EPA (VAYACOG) 100-19.5-6.5 MG CAPS Take 1 capsule by mouth daily. 30 capsule 11  . vitamin B-12 (CYANOCOBALAMIN) 1000 MCG tablet Take 1,000 mcg by mouth daily.      Marland Kitchen zolpidem (AMBIEN) 10 MG tablet Take 0.5-1 tablets (5-10 mg total) by mouth at bedtime as needed for sleep. 30 tablet 2   No facility-administered medications prior to visit.    ROS Review of Systems  Constitutional: Negative for chills,  activity change, appetite change, fatigue and unexpected weight change.  HENT: Negative for congestion, mouth sores and sinus pressure.   Eyes: Negative for visual disturbance.  Respiratory: Negative for cough and chest tightness.   Gastrointestinal: Negative for nausea and abdominal pain.  Genitourinary: Negative for frequency, difficulty urinating and vaginal pain.  Musculoskeletal: Positive for back pain, arthralgias and gait problem.  Skin: Negative for pallor and rash.  Neurological: Negative for dizziness, tremors, weakness, numbness and headaches.  Psychiatric/Behavioral: Positive for decreased concentration. Negative for confusion and sleep disturbance. The patient is nervous/anxious.     Objective:  BP 140/88 mmHg  Pulse 96  Wt 198 lb (89.812 kg)  SpO2 96%  BP Readings from Last 3 Encounters:  01/26/16 140/88  09/28/15 128/78  06/16/15 144/90    Wt Readings from Last 3 Encounters:  01/26/16 198 lb (89.812 kg)  09/28/15 196 lb (88.905 kg)  06/16/15 199 lb (90.266 kg)    Physical Exam  Constitutional: She appears well-developed. No distress.  HENT:  Head: Normocephalic.  Right Ear: External ear normal.  Left Ear: External ear normal.  Nose: Nose normal.  Mouth/Throat: Oropharynx is clear and moist.  Eyes: Conjunctivae are normal. Pupils are equal, round, and reactive to light. Right eye exhibits no discharge. Left eye exhibits no discharge.  Neck: Normal range of motion. Neck supple. No JVD present. No tracheal deviation present.  No thyromegaly present.  Cardiovascular: Normal rate, regular rhythm and normal heart sounds.   Pulmonary/Chest: No stridor. No respiratory distress. She has no wheezes.  Abdominal: Soft. Bowel sounds are normal. She exhibits no distension and no mass. There is no tenderness. There is no rebound and no guarding.  Musculoskeletal: She exhibits tenderness. She exhibits no edema.  Lymphadenopathy:    She has no cervical adenopathy.    Neurological: She displays normal reflexes. No cranial nerve deficit. She exhibits normal muscle tone. Coordination normal.  Skin: No rash noted. No erythema.  Psychiatric: She has a normal mood and affect. Her behavior is normal. Judgment and thought content normal.  Obese  Lab Results  Component Value Date   WBC 8.1 10/20/2014   HGB 14.9 10/20/2014   HCT 43.7 10/20/2014   PLT 310.0 10/20/2014   GLUCOSE 94 10/20/2014   CHOL 221* 10/20/2014   TRIG 140.0 10/20/2014   HDL 54.00 10/20/2014   LDLDIRECT 161.3 06/17/2011   LDLCALC 139* 10/20/2014   ALT 18 10/20/2014   AST 17 10/20/2014   NA 138 10/20/2014   K 4.2 10/20/2014   CL 105 10/20/2014   CREATININE 0.68 10/20/2014   BUN 18 10/20/2014   CO2 27 10/20/2014   TSH 2.10 10/20/2014   INR 1.76* 08/29/2009    Dg Knee Bilateral Standing Ap  02/25/2015  CLINICAL DATA:  Bilateral knee pain, no acute injury EXAM: BILATERAL KNEES STANDING - 1 VIEW COMPARISON:  None. FINDINGS: Standing frontal views of the knees show very little degenerative joint disease for age. There may be slight loss of medial joint spaces with minimal spurring present. No acute abnormality is seen. 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 No acute abnormality. Electronically Signed   By: Ivar Drape M.D.   On: 02/25/2015 14:35    Assessment & Plan:   Diagnoses and all orders for this visit:  ANEMIA, VITAMIN B12 DEFICIENCY NEC  B12 deficiency  Anxiety state  Acute midline low back pain with sciatica, sciatica laterality unspecified  OBESITY   I am having [name] maintain her vitamin B-12, cholecalciferol, Biotin, clobetasol cream, Ascorbic Acid (VITAMIN C PO), HYDROcodone-acetaminophen, Phosphatidylserine-DHA-EPA, Acetaminophen (TYLENOL ARTHRITIS PAIN PO), estradiol, NONFORMULARY OR COMPOUNDED ITEM, ergocalciferol, erythromycin, amoxicillin, diazepam, and zolpidem.  No orders of the defined types were  placed in this encounter.     Follow-up: No Follow-up on file.  [name], MD

## 2016-01-26 NOTE — Assessment & Plan Note (Signed)
Zolpidem prn  Potential benefits of a long term benzodiazepines  use as well as potential risks  and complications were explained to the patient and were aknowledged. 

## 2016-01-26 NOTE — Progress Notes (Signed)
Pre visit review using our clinic review tool, if applicable. No additional management support is needed unless otherwise documented below in the visit note. 

## 2016-01-26 NOTE — Assessment & Plan Note (Signed)
On B12 

## 2016-01-26 NOTE — Assessment & Plan Note (Signed)
?  stress related, [facility] Start Vayacog

## 2016-01-26 NOTE — Assessment & Plan Note (Signed)
Wt Readings from Last 3 Encounters:  01/26/16 198 lb (89.812 kg)  09/28/15 196 lb (88.905 kg)  06/16/15 199 lb (90.266 kg)

## 2016-01-26 NOTE — Assessment & Plan Note (Signed)
Diazepam prn  Potential benefits of a long term benzodiazepines  use as well as potential risks  and complications were explained to the patient and were aknowledged. 

## 2016-01-26 NOTE — Assessment & Plan Note (Signed)
Vit D 

## 2016-02-11 ENCOUNTER — Encounter: Payer: Self-pay | Admitting: Family

## 2016-02-11 ENCOUNTER — Ambulatory Visit (INDEPENDENT_AMBULATORY_CARE_PROVIDER_SITE_OTHER): Payer: Medicare Other | Admitting: Family

## 2016-02-11 ENCOUNTER — Telehealth: Payer: Self-pay | Admitting: Internal Medicine

## 2016-02-11 VITALS — BP 134/86 | HR 100 | Temp 98.0°F | Resp 18 | Wt 196.0 lb

## 2016-02-11 DIAGNOSIS — M62838 Other muscle spasm: Secondary | ICD-10-CM | POA: Diagnosis not present

## 2016-02-11 MED ORDER — CELECOXIB 100 MG PO CAPS: 100.0000 mg | ORAL_CAPSULE | Freq: Two times a day (BID) | ORAL | Status: DC

## 2016-02-11 MED ORDER — CYCLOBENZAPRINE HCL 10 MG PO TABS: 10.0000 mg | ORAL_TABLET | Freq: Every evening | ORAL | Status: DC | PRN

## 2016-02-11 NOTE — Progress Notes (Signed)
Subjective:    Patient ID: [name], female    DOB: 1941/07/15, 75 y.o.   MRN: [id number]   [name] is a 75 y.o. female who presents today for an acute visit.    HPI Comments: Patient here for evaluation of right low back pain which is radiating her right thigh. Pain started 1 week ago. Picked up heavy container of milk, otherwise no known injury to back.  She has tried narcotics with no relief. Pain is harp and constant. No numbness or tingling in right buttocks or right thigh. Pain improves if lies on left side to sleep or is very careful with getting OOB. No history of cancer. No LE weakness, falls, saddle anesthesia, or urinary or bowel incontinence. No h/o GIB. Has had celebrex in the past for back pain which has worked well.   H/o bilateral hip replacements.  Past Medical History  Diagnosis Date  . Anxiety   . Hyperlipemia   . LBP (low back pain)   . Osteoarthritis   . Vitamin D deficiency   . Vitamin B deficiency   . Degenerative disk disease   . Diverticulosis   . PONV (postoperative nausea and vomiting)   . Hypertension     not treated  . Chronic kidney disease 1967    hx kidney infection   Allergies: Augmentin; Cymbalta; Ezetimibe-simvastatin; and Phentermine hcl Current Outpatient Prescriptions on File Prior to Visit  Medication Sig Dispense Refill  . Acetaminophen (TYLENOL ARTHRITIS PAIN PO) Take by mouth.    Marland Kitchen amoxicillin (AMOXIL) 500 MG capsule TAKE 4 CAPSULES (2,000 MG TOTAL) BY MOUTH DAILY. FOR DENTAL VISITS. 20 capsule 2  . Ascorbic Acid (VITAMIN C PO) Take by mouth daily.    . Biotin 1000 MCG tablet Take 2,000 mcg by mouth 3 (three) times daily.    . Cholecalciferol (VITAMIN D3) 1000 UNITS tablet Take 2,000 Units by mouth daily.      . clobetasol cream (TEMOVATE) [id number] % Apply 1 application topically 2 (two) times a week.    . diazepam (VALIUM) 5 MG tablet Take 1 tablet (5 mg total) by mouth 2 (two) times daily as needed for anxiety or muscle  spasms. 60 tablet 2  . ergocalciferol (VITAMIN D2) 50000 UNITS capsule Take 1 capsule (50,000 Units total) by mouth once a week. 6 capsule 0  . erythromycin ophthalmic ointment Place 1 application into both eyes 2 (two) times daily. 3.5 g 0  . estradiol (ESTRACE) 0.1 MG/GM vaginal cream Place [id number] Applicatorfuls vaginally 2 (two) times a week. 42.5 g 8  . HYDROcodone-acetaminophen (NORCO) 5-325 MG per tablet Take 1 tablet by mouth every 4 (four) hours as needed. 30 tablet 0  . NONFORMULARY OR COMPOUNDED ITEM Estradiol vaginal cream 0.02% S: insert appful twice weekly 24 each 3  . Phosphatidylserine-DHA-EPA (VAYACOG) 100-19.5-6.5 MG CAPS Take 1 capsule by mouth daily. 30 capsule 11  . vitamin B-12 (CYANOCOBALAMIN) 1000 MCG tablet Take 1,000 mcg by mouth daily.      Marland Kitchen zolpidem (AMBIEN) 10 MG tablet Take 0.5-1 tablets (5-10 mg total) by mouth at bedtime as needed for sleep. 30 tablet 2   No current facility-administered medications on file prior to visit.    Social History  Substance Use Topics  . Smoking status: Former Smoker -- 3.00 packs/day for 20 years    Quit date: 04/19/1985  . Smokeless tobacco: Never Used  . Alcohol Use: Yes     Comment: occasionally     Review of  Systems  Constitutional: Negative for fever and chills.  Respiratory: Negative for cough.   Cardiovascular: Negative for chest pain and palpitations.  Gastrointestinal: Negative for nausea, vomiting and abdominal pain.  Musculoskeletal: Positive for back pain.  Neurological: Negative for weakness, numbness and headaches.      Objective:    BP 134/86 mmHg  Pulse 100  Temp(Src) 98 F (36.7 C) (Oral)  Resp 18  Wt 196 lb (88.905 kg)  SpO2 98%   Physical Exam  Constitutional: She appears well-developed and well-nourished.  Eyes: Conjunctivae are normal.  Cardiovascular: Normal rate, regular rhythm, normal heart sounds and normal pulses.   Pulmonary/Chest: Effort normal and breath sounds normal. She has no  wheezes. She has no rhonchi. She has no rales.  Musculoskeletal:       Lumbar back: She exhibits pain and spasm. She exhibits normal range of motion, no tenderness, no bony tenderness, no swelling and no edema.       Back:  Full range of motion with flexion, tension, lateral side bends. No bony tenderness. Pain as noted on diagram with palpation. No pain, numbness, tingling elicited with single leg raise bilaterally.   Neurological: She is alert. She has normal strength. No sensory deficit.  Reflex Scores:      Patellar reflexes are 2+ on the right side and 2+ on the left side. Sensation and strength intact bilateral lower extremities.  Skin: Skin is warm and dry.  Psychiatric: She has a normal mood and affect. Her speech is normal and behavior is normal. Thought content normal.  Vitals reviewed.      Assessment & Plan:  1. Muscle spasm Suspect muscle spasm. Patient agreed on conservative management at this time. Advised her to stop Norco as it is not working and is likely expired and also to not take Tylenol arthritis. For the acute pain, she will take the Flexeril and Celebrex.   - cyclobenzaprine (FLEXERIL) 10 MG tablet; Take 1 tablet (10 mg total) by mouth at bedtime as needed for muscle spasms.  Dispense: 14 tablet; Refill: 0 - celecoxib (CELEBREX) 100 MG capsule; Take 1 capsule (100 mg total) by mouth 2 (two) times daily.  Dispense: 60 capsule; Refill: 0     I am having Ms. Clowers maintain her vitamin B-12, cholecalciferol, Biotin, clobetasol cream, Ascorbic Acid (VITAMIN C PO), HYDROcodone-acetaminophen, Acetaminophen (TYLENOL ARTHRITIS PAIN PO), estradiol, NONFORMULARY OR COMPOUNDED ITEM, ergocalciferol, erythromycin, amoxicillin, diazepam, zolpidem, and Phosphatidylserine-DHA-EPA.   No orders of the defined types were placed in this encounter.    Return precautions given.   Risks, benefits, and alternatives of the medications and treatment plan prescribed today were  discussed, and patient expressed understanding.   Education regarding symptom management and diagnosis given to patient on AVS.  Continue to follow with [name], MD for routine health maintenance.   [name] and I agreed with plan.   Mable Paris, FNP

## 2016-02-11 NOTE — Progress Notes (Signed)
Pre visit review using our clinic review tool, if applicable. No additional management support is needed unless otherwise documented below in the visit note. 

## 2016-02-11 NOTE — Patient Instructions (Signed)
Let's treat conservatively as we discussed.   As we discussed, lets stop the Norco and Tylenol arthritis. For now only take the Flexeril at bedtime and may take the Celebrex during the day the low back pain.   Over-the-counter medications you may try for arthritic pain include:   ThermaCare patches   Capsaicin cream   Icy hot  Gentle stretches and heat for muscle spasm.  If conservative treatment doesn't yield results, we will consider physical therapy, consult to Sports Medicine/Orthopedics for further evaluation, and imaging.   If there is no improvement in your symptoms, or if there is any worsening of symptoms, or if you have any additional concerns, please return for re-evaluation; or, if we are closed, consider going to the Emergency Room for evaluation if symptoms urgent.

## 2016-02-11 NOTE — Telephone Encounter (Signed)
FYI:  Patient is coming in today to see [name] for back pain.

## 2016-02-15 ENCOUNTER — Ambulatory Visit: Payer: Medicare Other | Admitting: Internal Medicine

## 2016-02-17 ENCOUNTER — Encounter: Payer: Self-pay | Admitting: Internal Medicine

## 2016-02-17 ENCOUNTER — Ambulatory Visit (INDEPENDENT_AMBULATORY_CARE_PROVIDER_SITE_OTHER): Payer: Medicare Other | Admitting: Internal Medicine

## 2016-02-17 VITALS — BP 138/70 | HR 99 | Wt 199.0 lb

## 2016-02-17 DIAGNOSIS — M5440 Lumbago with sciatica, unspecified side: Secondary | ICD-10-CM | POA: Diagnosis not present

## 2016-02-17 MED ORDER — KETOROLAC TROMETHAMINE 60 MG/2ML IM SOLN
60.0000 mg | Freq: Once | INTRAMUSCULAR | Status: AC
  Administered 2016-02-17: 60 mg via INTRAMUSCULAR

## 2016-02-17 MED ORDER — HYDROCODONE-ACETAMINOPHEN 5-325 MG PO TABS: 1.0000 | ORAL_TABLET | ORAL | Status: DC | PRN

## 2016-02-17 MED ORDER — PREDNISONE 10 MG PO TABS: ORAL_TABLET | ORAL | Status: DC

## 2016-02-17 NOTE — Assessment & Plan Note (Addendum)
7/17 - L radiculopathy Prednisone 10 mg: take 4 tabs a day x 3 days; then 3 tabs a day x 4 days; then 2 tabs a day x 4 days, then 1 tab a day x 6 days, then stop. Take pc.  Potential benefits of a steroid  use as well as potential risks  and complications were explained to the patient and were aknowledged. Hold Celebrex  Norco prn  Potential benefits of a long term opioids use as well as potential risks (i.e. addiction risk, apnea etc) and complications (i.e. Somnolence, constipation and others) were explained to the patient and were aknowledged.  Toradol IM

## 2016-02-17 NOTE — Progress Notes (Signed)
Pre visit review using our clinic review tool, if applicable. No additional management support is needed unless otherwise documented below in the visit note. 

## 2016-02-17 NOTE — Progress Notes (Signed)
Subjective:  Patient ID: [name], female    DOB: Apr 02, 1941  Age: 75 y.o. MRN: [id number]  CC: Leg Pain   HPI [name] presents for severe  Shooting pain down L foot/big toe. Before she had a LBP on the R. LBP is 40 % better. Left leg/foot pain is worse - shooting tingling/burning pain.  Outpatient Prescriptions Prior to Visit  Medication Sig Dispense Refill  . Acetaminophen (TYLENOL ARTHRITIS PAIN PO) Take by mouth.    Marland Kitchen amoxicillin (AMOXIL) 500 MG capsule TAKE 4 CAPSULES (2,000 MG TOTAL) BY MOUTH DAILY. FOR DENTAL VISITS. 20 capsule 2  . Ascorbic Acid (VITAMIN C PO) Take by mouth daily.    . Biotin 1000 MCG tablet Take 2,000 mcg by mouth 3 (three) times daily.    . celecoxib (CELEBREX) 100 MG capsule Take 1 capsule (100 mg total) by mouth 2 (two) times daily. 60 capsule 0  . Cholecalciferol (VITAMIN D3) 1000 UNITS tablet Take 2,000 Units by mouth daily.      . clobetasol cream (TEMOVATE) [id number] % Apply 1 application topically 2 (two) times a week.    . cyclobenzaprine (FLEXERIL) 10 MG tablet Take 1 tablet (10 mg total) by mouth at bedtime as needed for muscle spasms. 14 tablet 0  . diazepam (VALIUM) 5 MG tablet Take 1 tablet (5 mg total) by mouth 2 (two) times daily as needed for anxiety or muscle spasms. 60 tablet 2  . ergocalciferol (VITAMIN D2) 50000 UNITS capsule Take 1 capsule (50,000 Units total) by mouth once a week. 6 capsule 0  . erythromycin ophthalmic ointment Place 1 application into both eyes 2 (two) times daily. 3.5 g 0  . estradiol (ESTRACE) 0.1 MG/GM vaginal cream Place [id number] Applicatorfuls vaginally 2 (two) times a week. 42.5 g 8  . HYDROcodone-acetaminophen (NORCO) 5-325 MG per tablet Take 1 tablet by mouth every 4 (four) hours as needed. 30 tablet 0  . NONFORMULARY OR COMPOUNDED ITEM Estradiol vaginal cream 0.02% S: insert appful twice weekly 24 each 3  . Phosphatidylserine-DHA-EPA (VAYACOG) 100-19.5-6.5 MG CAPS Take 1 capsule by mouth daily. 30 capsule  11  . vitamin B-12 (CYANOCOBALAMIN) 1000 MCG tablet Take 1,000 mcg by mouth daily.      Marland Kitchen zolpidem (AMBIEN) 10 MG tablet Take 0.5-1 tablets (5-10 mg total) by mouth at bedtime as needed for sleep. 30 tablet 2   No facility-administered medications prior to visit.    ROS Review of Systems  Constitutional: Negative for chills, activity change, appetite change, fatigue and unexpected weight change.  HENT: Negative for congestion, mouth sores and sinus pressure.   Eyes: Negative for visual disturbance.  Respiratory: Negative for cough and chest tightness.   Gastrointestinal: Negative for nausea and abdominal pain.  Genitourinary: Negative for frequency, difficulty urinating and vaginal pain.  Musculoskeletal: Positive for back pain. Negative for gait problem and neck pain.  Skin: Negative for pallor and rash.  Neurological: Negative for dizziness, tremors, weakness, numbness and headaches.  Psychiatric/Behavioral: Negative for confusion and sleep disturbance.    Objective:  BP 138/70 mmHg  Pulse 99  Wt 199 lb (90.266 kg)  SpO2 97%  BP Readings from Last 3 Encounters:  02/17/16 138/70  02/11/16 134/86  01/26/16 140/88    Wt Readings from Last 3 Encounters:  02/17/16 199 lb (90.266 kg)  02/11/16 196 lb (88.905 kg)  01/26/16 198 lb (89.812 kg)    Physical Exam  Constitutional: She appears well-developed. No distress.  HENT:  Head: Normocephalic.  Right Ear: External ear normal.  Left Ear: External ear normal.  Nose: Nose normal.  Mouth/Throat: Oropharynx is clear and moist.  Eyes: Conjunctivae are normal. Pupils are equal, round, and reactive to light. Right eye exhibits no discharge. Left eye exhibits no discharge.  Neck: Normal range of motion. Neck supple. No JVD present. No tracheal deviation present. No thyromegaly present.  Cardiovascular: Normal rate, regular rhythm and normal heart sounds.   Pulmonary/Chest: No stridor. No respiratory distress. She has no wheezes.    Abdominal: Soft. Bowel sounds are normal. She exhibits no distension and no mass. There is no tenderness. There is no rebound and no guarding.  Musculoskeletal: She exhibits tenderness. She exhibits no edema.  Lymphadenopathy:    She has no cervical adenopathy.  Neurological: She displays normal reflexes. No cranial nerve deficit. She exhibits normal muscle tone. Coordination normal.  Skin: No rash noted. No erythema.  Psychiatric: She has a normal mood and affect. Her behavior is normal. Judgment and thought content normal.  B buttocks tender L str leg is +/- on the left LS is tender  Lab Results  Component Value Date   WBC 8.1 10/20/2014   HGB 14.9 10/20/2014   HCT 43.7 10/20/2014   PLT 310.0 10/20/2014   GLUCOSE 94 10/20/2014   CHOL 221* 10/20/2014   TRIG 140.0 10/20/2014   HDL 54.00 10/20/2014   LDLDIRECT 161.3 06/17/2011   LDLCALC 139* 10/20/2014   ALT 18 10/20/2014   AST 17 10/20/2014   NA 138 10/20/2014   K 4.2 10/20/2014   CL 105 10/20/2014   CREATININE 0.68 10/20/2014   BUN 18 10/20/2014   CO2 27 10/20/2014   TSH 2.10 10/20/2014   INR 1.76* 08/29/2009    Dg Knee Bilateral Standing Ap  02/25/2015  CLINICAL DATA:  Bilateral knee pain, no acute injury EXAM: BILATERAL KNEES STANDING - 1 VIEW COMPARISON:  None. FINDINGS: Standing frontal views of the knees show very little degenerative joint disease for age. There may be slight loss of medial joint spaces with minimal spurring present. No acute abnormality is seen. 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 No acute abnormality. Electronically Signed   By: Ivar Drape M.D.   On: 02/25/2015 14:35    Assessment & Plan:   Camya was seen today for leg pain.  Diagnoses and all orders for this visit:  Acute midline low back pain with sciatica, sciatica laterality unspecified   I am having Ms. Sangiovanni maintain her vitamin B-12, cholecalciferol, Biotin, clobetasol cream,  Ascorbic Acid (VITAMIN C PO), HYDROcodone-acetaminophen, Acetaminophen (TYLENOL ARTHRITIS PAIN PO), estradiol, NONFORMULARY OR COMPOUNDED ITEM, ergocalciferol, erythromycin, amoxicillin, diazepam, zolpidem, Phosphatidylserine-DHA-EPA, cyclobenzaprine, and celecoxib.  No orders of the defined types were placed in this encounter.     Follow-up: No Follow-up on file.  [name], MD

## 2016-02-17 NOTE — Patient Instructions (Signed)
Sciatica With Rehab The sciatic nerve runs from the back down the leg and is responsible for sensation and control of the muscles in the back (posterior) side of the thigh, lower leg, and foot. Sciatica is a condition that is characterized by inflammation of this nerve.  SYMPTOMS   Signs of nerve damage, including numbness and/or weakness along the posterior side of the lower extremity.  Pain in the back of the thigh that may also travel down the leg.  Pain that worsens when sitting for long periods of time.  Occasionally, pain in the back or buttock. CAUSES  Inflammation of the sciatic nerve is the cause of sciatica. The inflammation is due to something irritating the nerve. Common sources of irritation include:  Sitting for long periods of time.  Direct trauma to the nerve.  Arthritis of the spine.  Herniated or ruptured disk.  Slipping of the vertebrae (spondylolisthesis).  Pressure from soft tissues, such as muscles or ligament-like tissue (fascia). RISK INCREASES WITH:  Sports that place pressure or stress on the spine (football or weightlifting).  Poor strength and flexibility.  Failure to warm up properly before activity.  Family history of low back pain or disk disorders.  Previous back injury or surgery.  Poor body mechanics, especially when lifting, or poor posture. PREVENTION   Warm up and stretch properly before activity.  Maintain physical fitness:  Strength, flexibility, and endurance.  Cardiovascular fitness.  Learn and use proper technique, especially with posture and lifting. When possible, have coach correct improper technique.  Avoid activities that place stress on the spine. PROGNOSIS If treated properly, then sciatica usually resolves within 6 weeks. However, occasionally surgery is necessary.  RELATED COMPLICATIONS   Permanent nerve damage, including pain, numbness, tingle, or weakness.  Chronic back pain.  Risks of surgery: infection,  bleeding, nerve damage, or damage to surrounding tissues. TREATMENT Treatment initially involves resting from any activities that aggravate your symptoms. The use of ice and medication may help reduce pain and inflammation. The use of strengthening and stretching exercises may help reduce pain with activity. These exercises may be performed at home or with referral to a therapist. A therapist may recommend further treatments, such as transcutaneous electronic nerve stimulation (TENS) or ultrasound. Your caregiver may recommend corticosteroid injections to help reduce inflammation of the sciatic nerve. If symptoms persist despite non-surgical (conservative) treatment, then surgery may be recommended. MEDICATION  If pain medication is necessary, then nonsteroidal anti-inflammatory medications, such as aspirin and ibuprofen, or other minor pain relievers, such as acetaminophen, are often recommended.  Do not take pain medication for 7 days before surgery.  Prescription pain relievers may be given if deemed necessary by your caregiver. Use only as directed and only as much as you need.  Ointments applied to the skin may be helpful.  Corticosteroid injections may be given by your caregiver. These injections should be reserved for the most serious cases, because they may only be given a certain number of times. HEAT AND COLD  Cold treatment (icing) relieves pain and reduces inflammation. Cold treatment should be applied for 10 to 15 minutes every 2 to 3 hours for inflammation and pain and immediately after any activity that aggravates your symptoms. Use ice packs or massage the area with a piece of ice (ice massage).  Heat treatment may be used prior to performing the stretching and strengthening activities prescribed by your caregiver, physical therapist, or athletic trainer. Use a heat pack or soak the injury in warm water.   SEEK MEDICAL CARE IF:  Treatment seems to offer no benefit, or the condition  worsens.  Any medications produce adverse side effects. EXERCISES  RANGE OF MOTION (ROM) AND STRETCHING EXERCISES - Sciatica Most people with sciatic will find that their symptoms worsen with either excessive bending forward (flexion) or arching at the low back (extension). The exercises which will help resolve your symptoms will focus on the opposite motion. Your physician, physical therapist or athletic trainer will help you determine which exercises will be most helpful to resolve your low back pain. Do not complete any exercises without first consulting with your clinician. Discontinue any exercises which worsen your symptoms until you speak to your clinician. If you have pain, numbness or tingling which travels down into your buttocks, leg or foot, the goal of the therapy is for these symptoms to move closer to your back and eventually resolve. Occasionally, these leg symptoms will get better, but your low back pain may worsen; this is typically an indication of progress in your rehabilitation. Be certain to be very alert to any changes in your symptoms and the activities in which you participated in the 24 hours prior to the change. Sharing this information with your clinician will allow him/her to most efficiently treat your condition. These exercises may help you when beginning to rehabilitate your injury. Your symptoms may resolve with or without further involvement from your physician, physical therapist or athletic trainer. While completing these exercises, remember:   Restoring tissue flexibility helps normal motion to return to the joints. This allows healthier, less painful movement and activity.  An effective stretch should be held for at least 30 seconds.  A stretch should never be painful. You should only feel a gentle lengthening or release in the stretched tissue. FLEXION RANGE OF MOTION AND STRETCHING EXERCISES: STRETCH - Flexion, Single Knee to Chest   Lie on a firm bed or floor  with both legs extended in front of you.  Keeping one leg in contact with the floor, bring your opposite knee to your chest. Hold your leg in place by either grabbing behind your thigh or at your knee.  Pull until you feel a gentle stretch in your low back. Hold __________ seconds.  Slowly release your grasp and repeat the exercise with the opposite side. Repeat __________ times. Complete this exercise __________ times per day.  STRETCH - Flexion, Double Knee to Chest  Lie on a firm bed or floor with both legs extended in front of you.  Keeping one leg in contact with the floor, bring your opposite knee to your chest.  Tense your stomach muscles to support your back and then lift your other knee to your chest. Hold your legs in place by either grabbing behind your thighs or at your knees.  Pull both knees toward your chest until you feel a gentle stretch in your low back. Hold __________ seconds.  Tense your stomach muscles and slowly return one leg at a time to the floor. Repeat __________ times. Complete this exercise __________ times per day.  STRETCH - Low Trunk Rotation   Lie on a firm bed or floor. Keeping your legs in front of you, bend your knees so they are both pointed toward the ceiling and your feet are flat on the floor.  Extend your arms out to the side. This will stabilize your upper body by keeping your shoulders in contact with the floor.  Gently and slowly drop both knees together to one side until   you feel a gentle stretch in your low back. Hold for __________ seconds.  Tense your stomach muscles to support your low back as you bring your knees back to the starting position. Repeat the exercise to the other side. Repeat __________ times. Complete this exercise __________ times per day  EXTENSION RANGE OF MOTION AND FLEXIBILITY EXERCISES: STRETCH - Extension, Prone on Elbows  Lie on your stomach on the floor, a bed will be too soft. Place your palms about shoulder  width apart and at the height of your head.  Place your elbows under your shoulders. If this is too painful, stack pillows under your chest.  Allow your body to relax so that your hips drop lower and make contact more completely with the floor.  Hold this position for __________ seconds.  Slowly return to lying flat on the floor. Repeat __________ times. Complete this exercise __________ times per day.  RANGE OF MOTION - Extension, Prone Press Ups  Lie on your stomach on the floor, a bed will be too soft. Place your palms about shoulder width apart and at the height of your head.  Keeping your back as relaxed as possible, slowly straighten your elbows while keeping your hips on the floor. You may adjust the placement of your hands to maximize your comfort. As you gain motion, your hands will come more underneath your shoulders.  Hold this position __________ seconds.  Slowly return to lying flat on the floor. Repeat __________ times. Complete this exercise __________ times per day.  STRENGTHENING EXERCISES - Sciatica  These exercises may help you when beginning to rehabilitate your injury. These exercises should be done near your "sweet spot." This is the neutral, low-back arch, somewhere between fully rounded and fully arched, that is your least painful position. When performed in this safe range of motion, these exercises can be used for people who have either a flexion or extension based injury. These exercises may resolve your symptoms with or without further involvement from your physician, physical therapist or athletic trainer. While completing these exercises, remember:   Muscles can gain both the endurance and the strength needed for everyday activities through controlled exercises.  Complete these exercises as instructed by your physician, physical therapist or athletic trainer. Progress with the resistance and repetition exercises only as your caregiver advises.  You may  experience muscle soreness or fatigue, but the pain or discomfort you are trying to eliminate should never worsen during these exercises. If this pain does worsen, stop and make certain you are following the directions exactly. If the pain is still present after adjustments, discontinue the exercise until you can discuss the trouble with your clinician. STRENGTHENING - Deep Abdominals, Pelvic Tilt   Lie on a firm bed or floor. Keeping your legs in front of you, bend your knees so they are both pointed toward the ceiling and your feet are flat on the floor.  Tense your lower abdominal muscles to press your low back into the floor. This motion will rotate your pelvis so that your tail bone is scooping upwards rather than pointing at your feet or into the floor.  With a gentle tension and even breathing, hold this position for __________ seconds. Repeat __________ times. Complete this exercise __________ times per day.  STRENGTHENING - Abdominals, Crunches   Lie on a firm bed or floor. Keeping your legs in front of you, bend your knees so they are both pointed toward the ceiling and your feet are flat on the   floor. Cross your arms over your chest.  Slightly tip your chin down without bending your neck.  Tense your abdominals and slowly lift your trunk high enough to just clear your shoulder blades. Lifting higher can put excessive stress on the low back and does not further strengthen your abdominal muscles.  Control your return to the starting position. Repeat __________ times. Complete this exercise __________ times per day.  STRENGTHENING - Quadruped, Opposite UE/LE Lift  Assume a hands and knees position on a firm surface. Keep your hands under your shoulders and your knees under your hips. You may place padding under your knees for comfort.  Find your neutral spine and gently tense your abdominal muscles so that you can maintain this position. Your shoulders and hips should form a rectangle  that is parallel with the floor and is not twisted.  Keeping your trunk steady, lift your right hand no higher than your shoulder and then your left leg no higher than your hip. Make sure you are not holding your breath. Hold this position __________ seconds.  Continuing to keep your abdominal muscles tense and your back steady, slowly return to your starting position. Repeat with the opposite arm and leg. Repeat __________ times. Complete this exercise __________ times per day.  STRENGTHENING - Abdominals and Quadriceps, Straight Leg Raise   Lie on a firm bed or floor with both legs extended in front of you.  Keeping one leg in contact with the floor, bend the other knee so that your foot can rest flat on the floor.  Find your neutral spine, and tense your abdominal muscles to maintain your spinal position throughout the exercise.  Slowly lift your straight leg off the floor about 6 inches for a count of 15, making sure to not hold your breath.  Still keeping your neutral spine, slowly lower your leg all the way to the floor. Repeat this exercise with each leg __________ times. Complete this exercise __________ times per day. POSTURE AND BODY MECHANICS CONSIDERATIONS - Sciatica Keeping correct posture when sitting, standing or completing your activities will reduce the stress put on different body tissues, allowing injured tissues a chance to heal and limiting painful experiences. The following are general guidelines for improved posture. Your physician or physical therapist will provide you with any instructions specific to your needs. While reading these guidelines, remember:  The exercises prescribed by your provider will help you have the flexibility and strength to maintain correct postures.  The correct posture provides the optimal environment for your joints to work. All of your joints have less wear and tear when properly supported by a spine with good posture. This means you will  experience a healthier, less painful body.  Correct posture must be practiced with all of your activities, especially prolonged sitting and standing. Correct posture is as important when doing repetitive low-stress activities (typing) as it is when doing a single heavy-load activity (lifting). RESTING POSITIONS Consider which positions are most painful for you when choosing a resting position. If you have pain with flexion-based activities (sitting, bending, stooping, squatting), choose a position that allows you to rest in a less flexed posture. You would want to avoid curling into a fetal position on your side. If your pain worsens with extension-based activities (prolonged standing, working overhead), avoid resting in an extended position such as sleeping on your stomach. Most people will find more comfort when they rest with their spine in a more neutral position, neither too rounded nor too   arched. Lying on a non-sagging bed on your side with a pillow between your knees, or on your back with a pillow under your knees will often provide some relief. Keep in mind, being in any one position for a prolonged period of time, no matter how correct your posture, can still lead to stiffness. PROPER SITTING POSTURE In order to minimize stress and discomfort on your spine, you must sit with correct posture Sitting with good posture should be effortless for a healthy body. Returning to good posture is a gradual process. Many people can work toward this most comfortably by using various supports until they have the flexibility and strength to maintain this posture on their own. When sitting with proper posture, your ears will fall over your shoulders and your shoulders will fall over your hips. You should use the back of the chair to support your upper back. Your low back will be in a neutral position, just slightly arched. You may place a small pillow or folded towel at the base of your low back for support.  When  working at a desk, create an environment that supports good, upright posture. Without extra support, muscles fatigue and lead to excessive strain on joints and other tissues. Keep these recommendations in mind: CHAIR:   A chair should be able to slide under your desk when your back makes contact with the back of the chair. This allows you to work closely.  The chair's height should allow your eyes to be level with the upper part of your monitor and your hands to be slightly lower than your elbows. BODY POSITION  Your feet should make contact with the floor. If this is not possible, use a foot rest.  Keep your ears over your shoulders. This will reduce stress on your neck and low back. INCORRECT SITTING POSTURES   If you are feeling tired and unable to assume a healthy sitting posture, do not slouch or slump. This puts excessive strain on your back tissues, causing more damage and pain. Healthier options include:  Using more support, like a lumbar pillow.  Switching tasks to something that requires you to be upright or walking.  Talking a brief walk.  Lying down to rest in a neutral-spine position. PROLONGED STANDING WHILE SLIGHTLY LEANING FORWARD  When completing a task that requires you to lean forward while standing in one place for a long time, place either foot up on a stationary 2-4 inch high object to help maintain the best posture. When both feet are on the ground, the low back tends to lose its slight inward curve. If this curve flattens (or becomes too large), then the back and your other joints will experience too much stress, fatigue more quickly and can cause pain.  CORRECT STANDING POSTURES Proper standing posture should be assumed with all daily activities, even if they only take a few moments, like when brushing your teeth. As in sitting, your ears should fall over your shoulders and your shoulders should fall over your hips. You should keep a slight tension in your abdominal  muscles to brace your spine. Your tailbone should point down to the ground, not behind your body, resulting in an over-extended swayback posture.  INCORRECT STANDING POSTURES  Common incorrect standing postures include a forward head, locked knees and/or an excessive swayback. WALKING Walk with an upright posture. Your ears, shoulders and hips should all line-up. PROLONGED ACTIVITY IN A FLEXED POSITION When completing a task that requires you to bend forward   at your waist or lean over a low surface, try to find a way to stabilize 3 of 4 of your limbs. You can place a hand or elbow on your thigh or rest a knee on the surface you are reaching across. This will provide you more stability so that your muscles do not fatigue as quickly. By keeping your knees relaxed, or slightly bent, you will also reduce stress across your low back. CORRECT LIFTING TECHNIQUES DO :   Assume a wide stance. This will provide you more stability and the opportunity to get as close as possible to the object which you are lifting.  Tense your abdominals to brace your spine; then bend at the knees and hips. Keeping your back locked in a neutral-spine position, lift using your leg muscles. Lift with your legs, keeping your back straight.  Test the weight of unknown objects before attempting to lift them.  Try to keep your elbows locked down at your sides in order get the best strength from your shoulders when carrying an object.  Always ask for help when lifting heavy or awkward objects. INCORRECT LIFTING TECHNIQUES DO NOT:   Lock your knees when lifting, even if it is a small object.  Bend and twist. Pivot at your feet or move your feet when needing to change directions.  Assume that you cannot safely pick up a paperclip without proper posture.   This information is not intended to replace advice given to you by your health care provider. Make sure you discuss any questions you have with your health care provider.     Document Released: 07/18/2005 Document Revised: 12/02/2014 Document Reviewed: 10/30/2008 [employer] Interactive Patient Education 2016 [employer]  

## 2016-02-17 NOTE — Addendum Note (Signed)
Addended by: Cresenciano Lick on: 02/17/2016 05:01 PM   Modules accepted: Orders

## 2016-02-24 ENCOUNTER — Telehealth: Payer: Self-pay | Admitting: Emergency Medicine

## 2016-02-24 NOTE — Telephone Encounter (Signed)
I do not have the MRI report yet Thx

## 2016-02-24 NOTE — Telephone Encounter (Signed)
I called pt- she still c/o "electrical shocks" feeling down her legs. She took her last dose of cyclobenzaprine today. She has no improvement in symptoms. Have you seen the the MRI she had on 02/20/16 at [facility]?  Please advise.

## 2016-02-24 NOTE — Telephone Encounter (Signed)
Pt has question about the MRI. Pt is still having pain and is about about out of medication. Please follow up thanks.

## 2016-02-26 ENCOUNTER — Other Ambulatory Visit: Payer: Medicare Other

## 2016-03-02 NOTE — Telephone Encounter (Signed)
I called [facility] and spoke to [place]. She is faxing MRI report over to me now.

## 2016-03-03 ENCOUNTER — Ambulatory Visit (INDEPENDENT_AMBULATORY_CARE_PROVIDER_SITE_OTHER): Payer: Medicare Other | Admitting: Internal Medicine

## 2016-03-03 ENCOUNTER — Encounter: Payer: Self-pay | Admitting: Internal Medicine

## 2016-03-03 DIAGNOSIS — M5440 Lumbago with sciatica, unspecified side: Secondary | ICD-10-CM

## 2016-03-03 DIAGNOSIS — M5416 Radiculopathy, lumbar region: Secondary | ICD-10-CM | POA: Insufficient documentation

## 2016-03-03 MED ORDER — PREDNISONE 10 MG PO TABS: ORAL_TABLET | ORAL | 1 refills | Status: DC

## 2016-03-03 NOTE — Progress Notes (Signed)
Pre visit review using our clinic review tool, if applicable. No additional management support is needed unless otherwise documented below in the visit note. 

## 2016-03-03 NOTE — Assessment & Plan Note (Signed)
7/17 Left L5-S1 - on the MRI 50% better w/steroids Ortho ref Dr Maxie Better

## 2016-03-03 NOTE — Progress Notes (Signed)
Subjective:  Patient ID: [name], female    DOB: [birth year]  Age: 75 y.o. MRN: [id number]  CC: No chief complaint on file.   HPI [name] presents for radiculopathic pain down the LLE and L foot - better after steroids - 50%  Outpatient Medications Prior to Visit  Medication Sig Dispense Refill  . Acetaminophen (TYLENOL ARTHRITIS PAIN PO) Take by mouth.    Marland Kitchen amoxicillin (AMOXIL) 500 MG capsule TAKE 4 CAPSULES (2,000 MG TOTAL) BY MOUTH DAILY. FOR DENTAL VISITS. 20 capsule 2  . Ascorbic Acid (VITAMIN C PO) Take by mouth daily.    . Biotin 1000 MCG tablet Take 2,000 mcg by mouth 3 (three) times daily.    . celecoxib (CELEBREX) 100 MG capsule Take 1 capsule (100 mg total) by mouth 2 (two) times daily. 60 capsule 0  . Cholecalciferol (VITAMIN D3) 1000 UNITS tablet Take 2,000 Units by mouth daily.      . clobetasol cream (TEMOVATE) [id number] % Apply 1 application topically 2 (two) times a week.    . cyclobenzaprine (FLEXERIL) 10 MG tablet Take 1 tablet (10 mg total) by mouth at bedtime as needed for muscle spasms. 14 tablet 0  . diazepam (VALIUM) 5 MG tablet Take 1 tablet (5 mg total) by mouth 2 (two) times daily as needed for anxiety or muscle spasms. 60 tablet 2  . ergocalciferol (VITAMIN D2) 50000 UNITS capsule Take 1 capsule (50,000 Units total) by mouth once a week. 6 capsule 0  . erythromycin ophthalmic ointment Place 1 application into both eyes 2 (two) times daily. 3.5 g 0  . estradiol (ESTRACE) 0.1 MG/GM vaginal cream Place [id number] Applicatorfuls vaginally 2 (two) times a week. 42.5 g 8  . HYDROcodone-acetaminophen (NORCO) 5-325 MG tablet Take 1 tablet by mouth every 4 (four) hours as needed for severe pain. 30 tablet 0  . NONFORMULARY OR COMPOUNDED ITEM Estradiol vaginal cream 0.02% S: insert appful twice weekly 24 each 3  . Phosphatidylserine-DHA-EPA (VAYACOG) 100-19.5-6.5 MG CAPS Take 1 capsule by mouth daily. 30 capsule 11  . predniSONE (DELTASONE) 10 MG tablet  Prednisone 10 mg: take 4 tabs a day x 3 days; then 3 tabs a day x 4 days; then 2 tabs a day x 4 days, then 1 tab a day x 6 days, then stop. Take pc. 38 tablet 1  . vitamin B-12 (CYANOCOBALAMIN) 1000 MCG tablet Take 1,000 mcg by mouth daily.      Marland Kitchen zolpidem (AMBIEN) 10 MG tablet Take 0.5-1 tablets (5-10 mg total) by mouth at bedtime as needed for sleep. 30 tablet 2   No facility-administered medications prior to visit.     ROS Review of Systems  Constitutional: Negative for activity change, appetite change, chills, fatigue and unexpected weight change.  HENT: Negative for congestion, mouth sores and sinus pressure.   Eyes: Negative for visual disturbance.  Respiratory: Negative for cough and chest tightness.   Gastrointestinal: Negative for abdominal pain and nausea.  Genitourinary: Negative for difficulty urinating, frequency and vaginal pain.  Musculoskeletal: Negative for back pain and gait problem.  Skin: Negative for pallor and rash.  Neurological: Negative for dizziness, tremors, weakness, numbness and headaches.  Psychiatric/Behavioral: Negative for confusion and sleep disturbance.    Objective:  BP 130/80   Pulse 96   Wt 196 lb (88.9 kg)   SpO2 94%   BMI 33.64 kg/m   BP Readings from Last 3 Encounters:  03/03/16 130/80  02/17/16 138/70  02/11/16 134/86  Wt Readings from Last 3 Encounters:  03/03/16 196 lb (88.9 kg)  02/17/16 199 lb (90.3 kg)  02/11/16 196 lb (88.9 kg)    Physical Exam  Constitutional: She appears well-developed. No distress.  HENT:  Head: Normocephalic.  Right Ear: External ear normal.  Left Ear: External ear normal.  Nose: Nose normal.  Mouth/Throat: Oropharynx is clear and moist.  Eyes: Conjunctivae are normal. Pupils are equal, round, and reactive to light. Right eye exhibits no discharge. Left eye exhibits no discharge.  Neck: Normal range of motion. Neck supple. No JVD present. No tracheal deviation present. No thyromegaly present.    Cardiovascular: Normal rate, regular rhythm and normal heart sounds.   Pulmonary/Chest: No stridor. No respiratory distress. She has no wheezes.  Abdominal: Soft. Bowel sounds are normal. She exhibits no distension and no mass. There is no tenderness. There is no rebound and no guarding.  Musculoskeletal: She exhibits tenderness. She exhibits no edema.  Lymphadenopathy:    She has no cervical adenopathy.  Neurological: She displays normal reflexes. No cranial nerve deficit. She exhibits normal muscle tone. Coordination normal.  Skin: No rash noted. No erythema.  Psychiatric: She has a normal mood and affect. Her behavior is normal. Judgment and thought content normal.  LS sensitive Str leg elev is (-) B  MRI report reviewed L5-S1 L herniated disk and a cyst  Lab Results  Component Value Date   WBC 8.1 10/20/2014   HGB 14.9 10/20/2014   HCT 43.7 10/20/2014   PLT 310.0 10/20/2014   GLUCOSE 94 10/20/2014   CHOL 221 (H) 10/20/2014   TRIG 140.0 10/20/2014   HDL 54.00 10/20/2014   LDLDIRECT 161.3 06/17/2011   LDLCALC 139 (H) 10/20/2014   ALT 18 10/20/2014   AST 17 10/20/2014   NA 138 10/20/2014   K 4.2 10/20/2014   CL 105 10/20/2014   CREATININE 0.68 10/20/2014   BUN 18 10/20/2014   CO2 27 10/20/2014   TSH 2.10 10/20/2014   INR 1.76 (H) 08/29/2009    Dg Knee Bilateral Standing Ap  Result Date: 02/25/2015 CLINICAL DATA:  Bilateral knee pain, no acute injury EXAM: BILATERAL KNEES STANDING - 1 VIEW COMPARISON:  None. FINDINGS: Standing frontal views of the knees show very little degenerative joint disease for age. There may be slight loss of medial joint spaces with minimal spurring present. No acute abnormality is seen. 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 No acute abnormality. Electronically Signed   By: Ivar Drape M.D.   On: 02/25/2015 14:35    Assessment & Plan:   There are no diagnoses linked to this encounter. I am  having Ms. Busk maintain her vitamin B-12, cholecalciferol, Biotin, clobetasol cream, Ascorbic Acid (VITAMIN C PO), Acetaminophen (TYLENOL ARTHRITIS PAIN PO), estradiol, NONFORMULARY OR COMPOUNDED ITEM, ergocalciferol, erythromycin, amoxicillin, diazepam, zolpidem, Phosphatidylserine-DHA-EPA, cyclobenzaprine, celecoxib, predniSONE, and HYDROcodone-acetaminophen.  No orders of the defined types were placed in this encounter.    Follow-up: No Follow-up on file.  [name], MD

## 2016-03-03 NOTE — Assessment & Plan Note (Signed)
Norco prn  Potential benefits of a long term opioids use as well as potential risks (i.e. addiction risk, apnea etc) and complications (i.e. Somnolence, constipation and others) were explained to the patient and were aknowledged. 

## 2016-03-03 NOTE — Patient Instructions (Signed)
Sciatica With Rehab The sciatic nerve runs from the back down the leg and is responsible for sensation and control of the muscles in the back (posterior) side of the thigh, lower leg, and foot. Sciatica is a condition that is characterized by inflammation of this nerve.  SYMPTOMS   Signs of nerve damage, including numbness and/or weakness along the posterior side of the lower extremity.  Pain in the back of the thigh that may also travel down the leg.  Pain that worsens when sitting for long periods of time.  Occasionally, pain in the back or buttock. CAUSES  Inflammation of the sciatic nerve is the cause of sciatica. The inflammation is due to something irritating the nerve. Common sources of irritation include:  Sitting for long periods of time.  Direct trauma to the nerve.  Arthritis of the spine.  Herniated or ruptured disk.  Slipping of the vertebrae (spondylolisthesis).  Pressure from soft tissues, such as muscles or ligament-like tissue (fascia). RISK INCREASES WITH:  Sports that place pressure or stress on the spine (football or weightlifting).  Poor strength and flexibility.  Failure to warm up properly before activity.  Family history of low back pain or disk disorders.  Previous back injury or surgery.  Poor body mechanics, especially when lifting, or poor posture. PREVENTION   Warm up and stretch properly before activity.  Maintain physical fitness:  Strength, flexibility, and endurance.  Cardiovascular fitness.  Learn and use proper technique, especially with posture and lifting. When possible, have coach correct improper technique.  Avoid activities that place stress on the spine. PROGNOSIS If treated properly, then sciatica usually resolves within 6 weeks. However, occasionally surgery is necessary.  RELATED COMPLICATIONS   Permanent nerve damage, including pain, numbness, tingle, or weakness.  Chronic back pain.  Risks of surgery: infection,  bleeding, nerve damage, or damage to surrounding tissues. TREATMENT Treatment initially involves resting from any activities that aggravate your symptoms. The use of ice and medication may help reduce pain and inflammation. The use of strengthening and stretching exercises may help reduce pain with activity. These exercises may be performed at home or with referral to a therapist. A therapist may recommend further treatments, such as transcutaneous electronic nerve stimulation (TENS) or ultrasound. Your caregiver may recommend corticosteroid injections to help reduce inflammation of the sciatic nerve. If symptoms persist despite non-surgical (conservative) treatment, then surgery may be recommended. MEDICATION  If pain medication is necessary, then nonsteroidal anti-inflammatory medications, such as aspirin and ibuprofen, or other minor pain relievers, such as acetaminophen, are often recommended.  Do not take pain medication for 7 days before surgery.  Prescription pain relievers may be given if deemed necessary by your caregiver. Use only as directed and only as much as you need.  Ointments applied to the skin may be helpful.  Corticosteroid injections may be given by your caregiver. These injections should be reserved for the most serious cases, because they may only be given a certain number of times. HEAT AND COLD  Cold treatment (icing) relieves pain and reduces inflammation. Cold treatment should be applied for 10 to 15 minutes every 2 to 3 hours for inflammation and pain and immediately after any activity that aggravates your symptoms. Use ice packs or massage the area with a piece of ice (ice massage).  Heat treatment may be used prior to performing the stretching and strengthening activities prescribed by your caregiver, physical therapist, or athletic trainer. Use a heat pack or soak the injury in warm water.   SEEK MEDICAL CARE IF:  Treatment seems to offer no benefit, or the condition  worsens.  Any medications produce adverse side effects. EXERCISES  RANGE OF MOTION (ROM) AND STRETCHING EXERCISES - Sciatica Most people with sciatic will find that their symptoms worsen with either excessive bending forward (flexion) or arching at the low back (extension). The exercises which will help resolve your symptoms will focus on the opposite motion. Your physician, physical therapist or athletic trainer will help you determine which exercises will be most helpful to resolve your low back pain. Do not complete any exercises without first consulting with your clinician. Discontinue any exercises which worsen your symptoms until you speak to your clinician. If you have pain, numbness or tingling which travels down into your buttocks, leg or foot, the goal of the therapy is for these symptoms to move closer to your back and eventually resolve. Occasionally, these leg symptoms will get better, but your low back pain may worsen; this is typically an indication of progress in your rehabilitation. Be certain to be very alert to any changes in your symptoms and the activities in which you participated in the 24 hours prior to the change. Sharing this information with your clinician will allow him/her to most efficiently treat your condition. These exercises may help you when beginning to rehabilitate your injury. Your symptoms may resolve with or without further involvement from your physician, physical therapist or athletic trainer. While completing these exercises, remember:   Restoring tissue flexibility helps normal motion to return to the joints. This allows healthier, less painful movement and activity.  An effective stretch should be held for at least 30 seconds.  A stretch should never be painful. You should only feel a gentle lengthening or release in the stretched tissue. FLEXION RANGE OF MOTION AND STRETCHING EXERCISES: STRETCH - Flexion, Single Knee to Chest   Lie on a firm bed or floor  with both legs extended in front of you.  Keeping one leg in contact with the floor, bring your opposite knee to your chest. Hold your leg in place by either grabbing behind your thigh or at your knee.  Pull until you feel a gentle stretch in your low back. Hold __________ seconds.  Slowly release your grasp and repeat the exercise with the opposite side. Repeat __________ times. Complete this exercise __________ times per day.  STRETCH - Flexion, Double Knee to Chest  Lie on a firm bed or floor with both legs extended in front of you.  Keeping one leg in contact with the floor, bring your opposite knee to your chest.  Tense your stomach muscles to support your back and then lift your other knee to your chest. Hold your legs in place by either grabbing behind your thighs or at your knees.  Pull both knees toward your chest until you feel a gentle stretch in your low back. Hold __________ seconds.  Tense your stomach muscles and slowly return one leg at a time to the floor. Repeat __________ times. Complete this exercise __________ times per day.  STRETCH - Low Trunk Rotation   Lie on a firm bed or floor. Keeping your legs in front of you, bend your knees so they are both pointed toward the ceiling and your feet are flat on the floor.  Extend your arms out to the side. This will stabilize your upper body by keeping your shoulders in contact with the floor.  Gently and slowly drop both knees together to one side until   you feel a gentle stretch in your low back. Hold for __________ seconds.  Tense your stomach muscles to support your low back as you bring your knees back to the starting position. Repeat the exercise to the other side. Repeat __________ times. Complete this exercise __________ times per day  EXTENSION RANGE OF MOTION AND FLEXIBILITY EXERCISES: STRETCH - Extension, Prone on Elbows  Lie on your stomach on the floor, a bed will be too soft. Place your palms about shoulder  width apart and at the height of your head.  Place your elbows under your shoulders. If this is too painful, stack pillows under your chest.  Allow your body to relax so that your hips drop lower and make contact more completely with the floor.  Hold this position for __________ seconds.  Slowly return to lying flat on the floor. Repeat __________ times. Complete this exercise __________ times per day.  RANGE OF MOTION - Extension, Prone Press Ups  Lie on your stomach on the floor, a bed will be too soft. Place your palms about shoulder width apart and at the height of your head.  Keeping your back as relaxed as possible, slowly straighten your elbows while keeping your hips on the floor. You may adjust the placement of your hands to maximize your comfort. As you gain motion, your hands will come more underneath your shoulders.  Hold this position __________ seconds.  Slowly return to lying flat on the floor. Repeat __________ times. Complete this exercise __________ times per day.  STRENGTHENING EXERCISES - Sciatica  These exercises may help you when beginning to rehabilitate your injury. These exercises should be done near your "sweet spot." This is the neutral, low-back arch, somewhere between fully rounded and fully arched, that is your least painful position. When performed in this safe range of motion, these exercises can be used for people who have either a flexion or extension based injury. These exercises may resolve your symptoms with or without further involvement from your physician, physical therapist or athletic trainer. While completing these exercises, remember:   Muscles can gain both the endurance and the strength needed for everyday activities through controlled exercises.  Complete these exercises as instructed by your physician, physical therapist or athletic trainer. Progress with the resistance and repetition exercises only as your caregiver advises.  You may  experience muscle soreness or fatigue, but the pain or discomfort you are trying to eliminate should never worsen during these exercises. If this pain does worsen, stop and make certain you are following the directions exactly. If the pain is still present after adjustments, discontinue the exercise until you can discuss the trouble with your clinician. STRENGTHENING - Deep Abdominals, Pelvic Tilt   Lie on a firm bed or floor. Keeping your legs in front of you, bend your knees so they are both pointed toward the ceiling and your feet are flat on the floor.  Tense your lower abdominal muscles to press your low back into the floor. This motion will rotate your pelvis so that your tail bone is scooping upwards rather than pointing at your feet or into the floor.  With a gentle tension and even breathing, hold this position for __________ seconds. Repeat __________ times. Complete this exercise __________ times per day.  STRENGTHENING - Abdominals, Crunches   Lie on a firm bed or floor. Keeping your legs in front of you, bend your knees so they are both pointed toward the ceiling and your feet are flat on the   floor. Cross your arms over your chest.  Slightly tip your chin down without bending your neck.  Tense your abdominals and slowly lift your trunk high enough to just clear your shoulder blades. Lifting higher can put excessive stress on the low back and does not further strengthen your abdominal muscles.  Control your return to the starting position. Repeat __________ times. Complete this exercise __________ times per day.  STRENGTHENING - Quadruped, Opposite UE/LE Lift  Assume a hands and knees position on a firm surface. Keep your hands under your shoulders and your knees under your hips. You may place padding under your knees for comfort.  Find your neutral spine and gently tense your abdominal muscles so that you can maintain this position. Your shoulders and hips should form a rectangle  that is parallel with the floor and is not twisted.  Keeping your trunk steady, lift your right hand no higher than your shoulder and then your left leg no higher than your hip. Make sure you are not holding your breath. Hold this position __________ seconds.  Continuing to keep your abdominal muscles tense and your back steady, slowly return to your starting position. Repeat with the opposite arm and leg. Repeat __________ times. Complete this exercise __________ times per day.  STRENGTHENING - Abdominals and Quadriceps, Straight Leg Raise   Lie on a firm bed or floor with both legs extended in front of you.  Keeping one leg in contact with the floor, bend the other knee so that your foot can rest flat on the floor.  Find your neutral spine, and tense your abdominal muscles to maintain your spinal position throughout the exercise.  Slowly lift your straight leg off the floor about 6 inches for a count of 15, making sure to not hold your breath.  Still keeping your neutral spine, slowly lower your leg all the way to the floor. Repeat this exercise with each leg __________ times. Complete this exercise __________ times per day. POSTURE AND BODY MECHANICS CONSIDERATIONS - Sciatica Keeping correct posture when sitting, standing or completing your activities will reduce the stress put on different body tissues, allowing injured tissues a chance to heal and limiting painful experiences. The following are general guidelines for improved posture. Your physician or physical therapist will provide you with any instructions specific to your needs. While reading these guidelines, remember:  The exercises prescribed by your provider will help you have the flexibility and strength to maintain correct postures.  The correct posture provides the optimal environment for your joints to work. All of your joints have less wear and tear when properly supported by a spine with good posture. This means you will  experience a healthier, less painful body.  Correct posture must be practiced with all of your activities, especially prolonged sitting and standing. Correct posture is as important when doing repetitive low-stress activities (typing) as it is when doing a single heavy-load activity (lifting). RESTING POSITIONS Consider which positions are most painful for you when choosing a resting position. If you have pain with flexion-based activities (sitting, bending, stooping, squatting), choose a position that allows you to rest in a less flexed posture. You would want to avoid curling into a fetal position on your side. If your pain worsens with extension-based activities (prolonged standing, working overhead), avoid resting in an extended position such as sleeping on your stomach. Most people will find more comfort when they rest with their spine in a more neutral position, neither too rounded nor too   arched. Lying on a non-sagging bed on your side with a pillow between your knees, or on your back with a pillow under your knees will often provide some relief. Keep in mind, being in any one position for a prolonged period of time, no matter how correct your posture, can still lead to stiffness. PROPER SITTING POSTURE In order to minimize stress and discomfort on your spine, you must sit with correct posture Sitting with good posture should be effortless for a healthy body. Returning to good posture is a gradual process. Many people can work toward this most comfortably by using various supports until they have the flexibility and strength to maintain this posture on their own. When sitting with proper posture, your ears will fall over your shoulders and your shoulders will fall over your hips. You should use the back of the chair to support your upper back. Your low back will be in a neutral position, just slightly arched. You may place a small pillow or folded towel at the base of your low back for support.  When  working at a desk, create an environment that supports good, upright posture. Without extra support, muscles fatigue and lead to excessive strain on joints and other tissues. Keep these recommendations in mind: CHAIR:   A chair should be able to slide under your desk when your back makes contact with the back of the chair. This allows you to work closely.  The chair's height should allow your eyes to be level with the upper part of your monitor and your hands to be slightly lower than your elbows. BODY POSITION  Your feet should make contact with the floor. If this is not possible, use a foot rest.  Keep your ears over your shoulders. This will reduce stress on your neck and low back. INCORRECT SITTING POSTURES   If you are feeling tired and unable to assume a healthy sitting posture, do not slouch or slump. This puts excessive strain on your back tissues, causing more damage and pain. Healthier options include:  Using more support, like a lumbar pillow.  Switching tasks to something that requires you to be upright or walking.  Talking a brief walk.  Lying down to rest in a neutral-spine position. PROLONGED STANDING WHILE SLIGHTLY LEANING FORWARD  When completing a task that requires you to lean forward while standing in one place for a long time, place either foot up on a stationary 2-4 inch high object to help maintain the best posture. When both feet are on the ground, the low back tends to lose its slight inward curve. If this curve flattens (or becomes too large), then the back and your other joints will experience too much stress, fatigue more quickly and can cause pain.  CORRECT STANDING POSTURES Proper standing posture should be assumed with all daily activities, even if they only take a few moments, like when brushing your teeth. As in sitting, your ears should fall over your shoulders and your shoulders should fall over your hips. You should keep a slight tension in your abdominal  muscles to brace your spine. Your tailbone should point down to the ground, not behind your body, resulting in an over-extended swayback posture.  INCORRECT STANDING POSTURES  Common incorrect standing postures include a forward head, locked knees and/or an excessive swayback. WALKING Walk with an upright posture. Your ears, shoulders and hips should all line-up. PROLONGED ACTIVITY IN A FLEXED POSITION When completing a task that requires you to bend forward   at your waist or lean over a low surface, try to find a way to stabilize 3 of 4 of your limbs. You can place a hand or elbow on your thigh or rest a knee on the surface you are reaching across. This will provide you more stability so that your muscles do not fatigue as quickly. By keeping your knees relaxed, or slightly bent, you will also reduce stress across your low back. CORRECT LIFTING TECHNIQUES DO :   Assume a wide stance. This will provide you more stability and the opportunity to get as close as possible to the object which you are lifting.  Tense your abdominals to brace your spine; then bend at the knees and hips. Keeping your back locked in a neutral-spine position, lift using your leg muscles. Lift with your legs, keeping your back straight.  Test the weight of unknown objects before attempting to lift them.  Try to keep your elbows locked down at your sides in order get the best strength from your shoulders when carrying an object.  Always ask for help when lifting heavy or awkward objects. INCORRECT LIFTING TECHNIQUES DO NOT:   Lock your knees when lifting, even if it is a small object.  Bend and twist. Pivot at your feet or move your feet when needing to change directions.  Assume that you cannot safely pick up a paperclip without proper posture.   This information is not intended to replace advice given to you by your health care provider. Make sure you discuss any questions you have with your health care provider.     Document Released: 07/18/2005 Document Revised: 12/02/2014 Document Reviewed: 10/30/2008 [employer] Interactive Patient Education 2016 [employer]  

## 2016-03-21 ENCOUNTER — Other Ambulatory Visit: Payer: Self-pay | Admitting: Internal Medicine

## 2016-03-23 MED ORDER — PREDNISONE 10 MG PO TABS: ORAL_TABLET | ORAL | 1 refills | Status: DC

## 2016-03-23 NOTE — Telephone Encounter (Signed)
MD clicked phone-in resubmit & fax through system...Johny Chess

## 2016-03-23 NOTE — Addendum Note (Signed)
Addended by: Earnstine Regal on: 03/23/2016 12:03 PM   Modules accepted: Orders

## 2016-04-15 ENCOUNTER — Ambulatory Visit (INDEPENDENT_AMBULATORY_CARE_PROVIDER_SITE_OTHER): Payer: Medicare Other | Admitting: Internal Medicine

## 2016-04-15 ENCOUNTER — Encounter: Payer: Self-pay | Admitting: Internal Medicine

## 2016-04-15 VITALS — BP 130/70 | HR 100 | Wt 200.0 lb

## 2016-04-15 DIAGNOSIS — Z23 Encounter for immunization: Secondary | ICD-10-CM | POA: Diagnosis not present

## 2016-04-15 DIAGNOSIS — F411 Generalized anxiety disorder: Secondary | ICD-10-CM | POA: Diagnosis not present

## 2016-04-15 DIAGNOSIS — E538 Deficiency of other specified B group vitamins: Secondary | ICD-10-CM | POA: Diagnosis not present

## 2016-04-15 DIAGNOSIS — E559 Vitamin D deficiency, unspecified: Secondary | ICD-10-CM

## 2016-04-15 DIAGNOSIS — M5440 Lumbago with sciatica, unspecified side: Secondary | ICD-10-CM | POA: Diagnosis not present

## 2016-04-15 DIAGNOSIS — D518 Other vitamin B12 deficiency anemias: Secondary | ICD-10-CM

## 2016-04-15 DIAGNOSIS — E785 Hyperlipidemia, unspecified: Secondary | ICD-10-CM

## 2016-04-15 MED ORDER — CYANOCOBALAMIN 1000 MCG/ML IJ SOLN
1000.0000 ug | Freq: Once | INTRAMUSCULAR | Status: AC
  Administered 2016-04-15: 1000 ug via INTRAMUSCULAR

## 2016-04-15 NOTE — Assessment & Plan Note (Signed)
On B12 

## 2016-04-15 NOTE — Assessment & Plan Note (Signed)
She has been off Vit D and B12 x 2 mo Re-start B12 Will give IM

## 2016-04-15 NOTE — Assessment & Plan Note (Signed)
She has been off Vit D and B12 x 2 mo Re-start D

## 2016-04-15 NOTE — Assessment & Plan Note (Signed)
  On diet  

## 2016-04-15 NOTE — Addendum Note (Signed)
Addended by: Cresenciano Lick on: 04/15/2016 05:23 PM   Modules accepted: Orders

## 2016-04-15 NOTE — Assessment & Plan Note (Signed)
Norco prn  Potential benefits of a long term opioids use as well as potential risks (i.e. addiction risk, apnea etc) and complications (i.e. Somnolence, constipation and others) were explained to the patient and were aknowledged. 

## 2016-04-15 NOTE — Progress Notes (Signed)
Pre visit review using our clinic review tool, if applicable. No additional management support is needed unless otherwise documented below in the visit note. 

## 2016-04-15 NOTE — Progress Notes (Signed)
Subjective:  Patient ID: [name], female    DOB: 02-11-1941  Age: 75 y.o. MRN: [id number]  CC: No chief complaint on file.   HPI [name] presents for LLE sciatica - she saw Dr Maxie Better. Epidural shots are planned for Sept 26 w/Dr Ramos. She was told to use Prednisone until the inj  F/u LBP F/u memory issues  She has been off Vit D and B12 x 2 mo for ?reason  Outpatient Medications Prior to Visit  Medication Sig Dispense Refill  . Acetaminophen (TYLENOL ARTHRITIS PAIN PO) Take by mouth.    Marland Kitchen amoxicillin (AMOXIL) 500 MG capsule TAKE 4 CAPSULES (2,000 MG TOTAL) BY MOUTH DAILY. FOR DENTAL VISITS. 20 capsule 2  . Ascorbic Acid (VITAMIN C PO) Take by mouth daily.    . Biotin 1000 MCG tablet Take 2,000 mcg by mouth 3 (three) times daily.    . celecoxib (CELEBREX) 100 MG capsule Take 1 capsule (100 mg total) by mouth 2 (two) times daily. 60 capsule 0  . Cholecalciferol (VITAMIN D3) 1000 UNITS tablet Take 2,000 Units by mouth daily.      . clobetasol cream (TEMOVATE) [id number] % Apply 1 application topically 2 (two) times a week.    . cyclobenzaprine (FLEXERIL) 10 MG tablet Take 1 tablet (10 mg total) by mouth at bedtime as needed for muscle spasms. 14 tablet 0  . diazepam (VALIUM) 5 MG tablet Take 1 tablet (5 mg total) by mouth 2 (two) times daily as needed for anxiety or muscle spasms. 60 tablet 2  . ergocalciferol (VITAMIN D2) 50000 UNITS capsule Take 1 capsule (50,000 Units total) by mouth once a week. 6 capsule 0  . erythromycin ophthalmic ointment Place 1 application into both eyes 2 (two) times daily. 3.5 g 0  . estradiol (ESTRACE) 0.1 MG/GM vaginal cream Place [id number] Applicatorfuls vaginally 2 (two) times a week. 42.5 g 8  . HYDROcodone-acetaminophen (NORCO) 5-325 MG tablet Take 1 tablet by mouth every 4 (four) hours as needed for severe pain. 30 tablet 0  . NONFORMULARY OR COMPOUNDED ITEM Estradiol vaginal cream 0.02% S: insert appful twice weekly 24 each 3  .  Phosphatidylserine-DHA-EPA (VAYACOG) 100-19.5-6.5 MG CAPS Take 1 capsule by mouth daily. 30 capsule 11  . predniSONE (DELTASONE) 10 MG tablet TAKE 4TABS BY MOUTH X3DAYS, 3TABS X4DAYS, 2TABS X4DAYS, 1TAB X6DAYS, THEN STOP 38 tablet 1  . vitamin B-12 (CYANOCOBALAMIN) 1000 MCG tablet Take 1,000 mcg by mouth daily.      Marland Kitchen zolpidem (AMBIEN) 10 MG tablet Take 0.5-1 tablets (5-10 mg total) by mouth at bedtime as needed for sleep. 30 tablet 2   No facility-administered medications prior to visit.     ROS Review of Systems  Constitutional: Negative for activity change, appetite change, chills, fatigue and unexpected weight change.  HENT: Negative for congestion, mouth sores and sinus pressure.   Eyes: Negative for visual disturbance.  Respiratory: Negative for cough and chest tightness.   Gastrointestinal: Negative for abdominal pain and nausea.  Genitourinary: Negative for difficulty urinating, frequency and vaginal pain.  Musculoskeletal: Positive for back pain and gait problem.  Skin: Negative for pallor and rash.  Neurological: Negative for dizziness, tremors, weakness, numbness and headaches.  Psychiatric/Behavioral: Negative for confusion and sleep disturbance. The patient is nervous/anxious.     Objective:  BP 130/70   Pulse 100   Wt 200 lb (90.7 kg)   SpO2 97%   BMI 34.33 kg/m   BP Readings from Last 3 Encounters:  04/15/16 130/70  03/03/16 130/80  02/17/16 138/70    Wt Readings from Last 3 Encounters:  04/15/16 200 lb (90.7 kg)  03/03/16 196 lb (88.9 kg)  02/17/16 199 lb (90.3 kg)    Physical Exam  Constitutional: She appears well-developed. No distress.  HENT:  Head: Normocephalic.  Right Ear: External ear normal.  Left Ear: External ear normal.  Nose: Nose normal.  Mouth/Throat: Oropharynx is clear and moist.  Eyes: Conjunctivae are normal. Pupils are equal, round, and reactive to light. Right eye exhibits no discharge. Left eye exhibits no discharge.  Neck:  Normal range of motion. Neck supple. No JVD present. No tracheal deviation present. No thyromegaly present.  Cardiovascular: Normal rate, regular rhythm and normal heart sounds.   Pulmonary/Chest: No stridor. No respiratory distress. She has no wheezes.  Abdominal: Soft. Bowel sounds are normal. She exhibits no distension and no mass. There is no tenderness. There is no rebound and no guarding.  Musculoskeletal: She exhibits tenderness. She exhibits no edema.  Lymphadenopathy:    She has no cervical adenopathy.  Neurological: She displays normal reflexes. No cranial nerve deficit. She exhibits normal muscle tone. Coordination normal.  Skin: No rash noted. No erythema.  Psychiatric: She has a normal mood and affect. Her behavior is normal. Judgment and thought content normal.    Lab Results  Component Value Date   WBC 8.1 10/20/2014   HGB 14.9 10/20/2014   HCT 43.7 10/20/2014   PLT 310.0 10/20/2014   GLUCOSE 94 10/20/2014   CHOL 221 (H) 10/20/2014   TRIG 140.0 10/20/2014   HDL 54.00 10/20/2014   LDLDIRECT 161.3 06/17/2011   LDLCALC 139 (H) 10/20/2014   ALT 18 10/20/2014   AST 17 10/20/2014   NA 138 10/20/2014   K 4.2 10/20/2014   CL 105 10/20/2014   CREATININE 0.68 10/20/2014   BUN 18 10/20/2014   CO2 27 10/20/2014   TSH 2.10 10/20/2014   INR 1.76 (H) 08/29/2009    Dg Knee Bilateral Standing Ap  Result Date: 02/25/2015 CLINICAL DATA:  Bilateral knee pain, no acute injury EXAM: BILATERAL KNEES STANDING - 1 VIEW COMPARISON:  None. FINDINGS: Standing frontal views of the knees show very little degenerative joint disease for age. There may be slight loss of medial joint spaces with minimal spurring present. No acute abnormality is seen. 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 No acute abnormality. Electronically Signed   By: Ivar Drape M.D.   On: 02/25/2015 14:35    Assessment & Plan:   There are no diagnoses linked to this  encounter. I am having Ms. Hinesley maintain her vitamin B-12, cholecalciferol, Biotin, clobetasol cream, Ascorbic Acid (VITAMIN C PO), Acetaminophen (TYLENOL ARTHRITIS PAIN PO), estradiol, NONFORMULARY OR COMPOUNDED ITEM, ergocalciferol, erythromycin, amoxicillin, diazepam, zolpidem, Phosphatidylserine-DHA-EPA, cyclobenzaprine, celecoxib, HYDROcodone-acetaminophen, and predniSONE.  No orders of the defined types were placed in this encounter.    Follow-up: No Follow-up on file.  [name], MD

## 2016-04-15 NOTE — Assessment & Plan Note (Signed)
Norco prn Worse on Prednisone  Potential benefits of a long term opioids use as well as potential risks (i.e. addiction risk, apnea etc) and complications (i.e. Somnolence, constipation and others) were explained to the patient and were aknowledged.

## 2016-05-03 ENCOUNTER — Other Ambulatory Visit: Payer: Self-pay | Admitting: Gynecology

## 2016-05-30 ENCOUNTER — Ambulatory Visit: Payer: Medicare Other | Admitting: Internal Medicine

## 2016-06-15 ENCOUNTER — Encounter: Payer: Self-pay | Admitting: Anesthesiology

## 2016-06-16 ENCOUNTER — Ambulatory Visit (INDEPENDENT_AMBULATORY_CARE_PROVIDER_SITE_OTHER): Payer: Medicare Other | Admitting: Gynecology

## 2016-06-16 ENCOUNTER — Encounter: Payer: Self-pay | Admitting: Gynecology

## 2016-06-16 ENCOUNTER — Other Ambulatory Visit: Payer: Self-pay | Admitting: Gynecology

## 2016-06-16 VITALS — BP 138/82 | Ht 64.0 in | Wt 200.0 lb

## 2016-06-16 DIAGNOSIS — N952 Postmenopausal atrophic vaginitis: Secondary | ICD-10-CM | POA: Diagnosis not present

## 2016-06-16 DIAGNOSIS — B9689 Other specified bacterial agents as the cause of diseases classified elsewhere: Secondary | ICD-10-CM

## 2016-06-16 DIAGNOSIS — Z79890 Hormone replacement therapy (postmenopausal): Secondary | ICD-10-CM | POA: Diagnosis not present

## 2016-06-16 DIAGNOSIS — N898 Other specified noninflammatory disorders of vagina: Secondary | ICD-10-CM

## 2016-06-16 DIAGNOSIS — M8580 Other specified disorders of bone density and structure, unspecified site: Secondary | ICD-10-CM

## 2016-06-16 DIAGNOSIS — Z124 Encounter for screening for malignant neoplasm of cervix: Secondary | ICD-10-CM | POA: Diagnosis not present

## 2016-06-16 DIAGNOSIS — Z8639 Personal history of other endocrine, nutritional and metabolic disease: Secondary | ICD-10-CM

## 2016-06-16 DIAGNOSIS — N760 Acute vaginitis: Secondary | ICD-10-CM

## 2016-06-16 DIAGNOSIS — L900 Lichen sclerosus et atrophicus: Secondary | ICD-10-CM

## 2016-06-16 DIAGNOSIS — Z9189 Other specified personal risk factors, not elsewhere classified: Secondary | ICD-10-CM | POA: Diagnosis not present

## 2016-06-16 DIAGNOSIS — Z01411 Encounter for gynecological examination (general) (routine) with abnormal findings: Secondary | ICD-10-CM

## 2016-06-16 LAB — WET PREP FOR TRICH, YEAST, CLUE
Clue Cells Wet Prep HPF POC: NONE SEEN
Trich, Wet Prep: NONE SEEN
Yeast Wet Prep HPF POC: NONE SEEN

## 2016-06-16 MED ORDER — NONFORMULARY OR COMPOUNDED ITEM: 3 refills | Status: DC

## 2016-06-16 MED ORDER — METRONIDAZOLE 0.75 % VA GEL: 1.0000 | Freq: Two times a day (BID) | VAGINAL | 0 refills | Status: DC

## 2016-06-16 MED ORDER — CLOBETASOL PROPIONATE 0.05 % EX CREA: TOPICAL_CREAM | CUTANEOUS | 3 refills | Status: DC

## 2016-06-16 NOTE — Progress Notes (Signed)
[name] 1941-06-23 [id number]   History:    75 y.o.  for annual gyn exam with a complaint of the past few days of a yellowish-like discharge. Patient is not sexually active. Patient in the past has been diagnosed with lichen sclerosus of the vulva and has done well by applying clobetasol 0.05% weekly. For her vaginal atrophy she has been applying estradiol 0.02% twice a week. 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 The patient had been followed by the general surgeon. Patient scheduled for mammogram next week.  Dr. Alain Marion who has done her lab work. .Review record indicated also that she had a normal colonoscopy in 2008. Although several years ago in [place] she had benign colon polyp removed.She had a breast reduction in the year 2000. She had bilateral hip replacement.She has had a cervical discectomy C2-C3, C3-C4 in 1995 and 2009 respectively. She is also been followed regularly by Dr. Martinique her dermatologist for mole checks.Her last bone density study here in our office was in 2016 lowest T score was in the forearm visceral third radius right arm. Patient is taking her calcium vitamin D. She has had history vitamin D deficiency in the past.   Past medical history,surgical history, family history and social history were all reviewed and documented in the EPIC chart.  Gynecologic History No LMP recorded. Patient has had a hysterectomy. Contraception: status post hysterectomy Last Pap: 2012. Results were: normal Last mammogram: 2017 has a recall for a 3-D an ultrasound of the left breast in the next few weeks. Results were: See previous comment  Obstetric History OB History  Gravida Para Term Preterm AB Living  2 1     1 1   SAB TAB Ectopic Multiple Live Births    1          # Outcome Date GA Lbr Len/2nd Weight Sex Delivery Anes  PTL Lv  2 TAB           1 Para     M Vag-Spont          ROS: A ROS was performed and pertinent positives and negatives are included in the history.  GENERAL: No fevers or chills. HEENT: No change in vision, no earache, sore throat or sinus congestion. NECK: No pain or stiffness. CARDIOVASCULAR: No chest pain or pressure. No palpitations. PULMONARY: No shortness of breath, cough or wheeze. GASTROINTESTINAL: No abdominal pain, nausea, vomiting or diarrhea, melena or bright red blood per rectum. GENITOURINARY: No urinary frequency, urgency, hesitancy or dysuria. MUSCULOSKELETAL: No joint or muscle pain, no back pain, no recent trauma. DERMATOLOGIC: No rash, no itching, no lesions. ENDOCRINE: No polyuria, polydipsia, no heat or cold intolerance. No recent change in weight. HEMATOLOGICAL: No anemia or easy bruising or bleeding. NEUROLOGIC: No headache, seizures, numbness, tingling or weakness. PSYCHIATRIC: No depression, no loss of interest in normal activity or change in sleep pattern.     Exam: chaperone present  BP 138/82   Ht 5\' 4"  (1.626 m)   Wt 200 lb (90.7 kg)   BMI 34.33 kg/m   Body mass index is 34.33 kg/m.  General appearance : Well developed well nourished female. No acute distress HEENT: Eyes: no retinal hemorrhage or exudates,  Neck supple, trachea midline, no carotid bruits, no thyroidmegaly Lungs: Clear to auscultation, no rhonchi or wheezes, or rib retractions  Heart: Regular  rate and rhythm, no murmurs or gallops Breast:Examined in sitting and supine position were symmetrical in appearance, no palpable masses or tenderness,  no skin retraction, no nipple inversion, no nipple discharge, no skin discoloration, no axillary or supraclavicular lymphadenopathy Abdomen: no palpable masses or tenderness, no rebound or guarding Extremities: no edema or skin discoloration or tenderness  Pelvic:  Bartholin, Urethra, Skene Glands: Within normal limits             Vagina: No gross  lesions or discharge, Atrophic changes  Cervix:Absent  Uterus  absent  Adnexa  Without masses or tenderness  Anus and perineum  normal   Rectovaginal  normal sphincter tone without palpated masses or tenderness             Hemoccult PCP provides   Wet prep moderate bacteria, few WBC  Assessment/Plan:  75 y.o. female for annual exam with nonspecific vaginitis possible early BV will be treated with MetroGel vaginal to apply daily at bedtime for 5 nights. She will hold off on her vaginal estrogen as well as her external clobetasol until that after she completes the above treatment. Patient's lichen sclerosus has responded well to the twice a week clobetasol application and the twice a week intravaginal estrogen has help with her vaginal atrophy. We did do a Pap smear today because a complaint of brownish yellowish discharge although she has had a supracervical hysterectomy and has always had normal Pap smear. She was reminded that she needs her colonoscopy next year as well as a bone density study. Because of her past history vitamin D deficiency vitamin D level be checked today. We once again discussed importance of calcium vitamin D and weightbearing exercises for osteoporosis prevention. PCP has been doing her blood work and her vaccines.  Terrance Mass MD, 1:00 PM 06/16/2016

## 2016-06-17 ENCOUNTER — Encounter: Payer: Self-pay | Admitting: Gynecology

## 2016-06-17 ENCOUNTER — Encounter: Payer: Self-pay | Admitting: Internal Medicine

## 2016-06-17 LAB — VITAMIN D 25 HYDROXY (VIT D DEFICIENCY, FRACTURES): Vit D, 25-Hydroxy: 25 ng/mL — ABNORMAL LOW (ref 30–100)

## 2016-06-20 ENCOUNTER — Other Ambulatory Visit: Payer: Self-pay | Admitting: Gynecology

## 2016-06-20 ENCOUNTER — Encounter: Payer: Self-pay | Admitting: *Deleted

## 2016-06-20 DIAGNOSIS — E559 Vitamin D deficiency, unspecified: Secondary | ICD-10-CM

## 2016-06-20 MED ORDER — VITAMIN D (ERGOCALCIFEROL) 1.25 MG (50000 UNIT) PO CAPS: ORAL_CAPSULE | ORAL | 0 refills | Status: DC

## 2016-06-21 LAB — PAP IG W/ RFLX HPV ASCU

## 2016-07-15 ENCOUNTER — Ambulatory Visit (INDEPENDENT_AMBULATORY_CARE_PROVIDER_SITE_OTHER): Payer: Medicare Other | Admitting: Internal Medicine

## 2016-07-15 ENCOUNTER — Encounter: Payer: Self-pay | Admitting: Internal Medicine

## 2016-07-15 DIAGNOSIS — E66811 Obesity, class 1: Secondary | ICD-10-CM

## 2016-07-15 DIAGNOSIS — E559 Vitamin D deficiency, unspecified: Secondary | ICD-10-CM

## 2016-07-15 DIAGNOSIS — Z6834 Body mass index (BMI) 34.0-34.9, adult: Secondary | ICD-10-CM

## 2016-07-15 DIAGNOSIS — E6609 Other obesity due to excess calories: Secondary | ICD-10-CM | POA: Diagnosis not present

## 2016-07-15 DIAGNOSIS — M5440 Lumbago with sciatica, unspecified side: Secondary | ICD-10-CM

## 2016-07-15 DIAGNOSIS — E538 Deficiency of other specified B group vitamins: Secondary | ICD-10-CM | POA: Diagnosis not present

## 2016-07-15 DIAGNOSIS — F411 Generalized anxiety disorder: Secondary | ICD-10-CM

## 2016-07-15 MED ORDER — DIAZEPAM 5 MG PO TABS: 5.0000 mg | ORAL_TABLET | Freq: Two times a day (BID) | ORAL | 2 refills | Status: DC | PRN

## 2016-07-15 MED ORDER — HYDROCODONE-ACETAMINOPHEN 5-325 MG PO TABS: 1.0000 | ORAL_TABLET | ORAL | 0 refills | Status: DC | PRN

## 2016-07-15 MED ORDER — AMOXICILLIN 500 MG PO CAPS: ORAL_CAPSULE | ORAL | 2 refills | Status: DC

## 2016-07-15 NOTE — Assessment & Plan Note (Signed)
Norco prn  Potential benefits of a long term opioids use as well as potential risks (i.e. addiction risk, apnea etc) and complications (i.e. Somnolence, constipation and others) were explained to the patient and were aknowledged. 

## 2016-07-15 NOTE — Patient Instructions (Signed)
McKenzie inflatable back pillow

## 2016-07-15 NOTE — Assessment & Plan Note (Signed)
On B12 

## 2016-07-15 NOTE — Assessment & Plan Note (Signed)
On Vit D 

## 2016-07-15 NOTE — Progress Notes (Signed)
Subjective:  Patient ID: [name], female    DOB: 1940-10-18  Age: 75 y.o. MRN: [id number]  CC: No chief complaint on file.   HPI MINORI FLEES presents for LBP, anxiety, B12 def and Vit D def f/u  Outpatient Medications Prior to Visit  Medication Sig Dispense Refill  . Acetaminophen (TYLENOL ARTHRITIS PAIN PO) Take by mouth.    Marland Kitchen amoxicillin (AMOXIL) 500 MG capsule TAKE 4 CAPSULES (2,000 MG TOTAL) BY MOUTH DAILY. FOR DENTAL VISITS. 20 capsule 2  . Ascorbic Acid (VITAMIN C PO) Take by mouth daily.    . Biotin 1000 MCG tablet Take 2,000 mcg by mouth 3 (three) times daily.    . celecoxib (CELEBREX) 100 MG capsule Take 1 capsule (100 mg total) by mouth 2 (two) times daily. 60 capsule 0  . Cholecalciferol (VITAMIN D3) 1000 UNITS tablet Take 2,000 Units by mouth daily.      . clobetasol cream (TEMOVATE) [id number] % Apply 1 application topically 2 (two) times a week.    . clobetasol cream (TEMOVATE) 0.05 % Apply weeekly 30 g 3  . cyclobenzaprine (FLEXERIL) 10 MG tablet Take 1 tablet (10 mg total) by mouth at bedtime as needed for muscle spasms. 14 tablet 0  . diazepam (VALIUM) 5 MG tablet Take 1 tablet (5 mg total) by mouth 2 (two) times daily as needed for anxiety or muscle spasms. 60 tablet 2  . ergocalciferol (VITAMIN D2) 50000 UNITS capsule Take 1 capsule (50,000 Units total) by mouth once a week. 6 capsule 0  . erythromycin ophthalmic ointment Place 1 application into both eyes 2 (two) times daily. 3.5 g 0  . estradiol (ESTRACE) 0.1 MG/GM vaginal cream Place [id number] Applicatorfuls vaginally 2 (two) times a week. 42.5 g 8  . HYDROcodone-acetaminophen (NORCO) 5-325 MG tablet Take 1 tablet by mouth every 4 (four) hours as needed for severe pain. 30 tablet 0  . metroNIDAZOLE (METROGEL) 0.75 % vaginal gel Place 1 Applicatorful vaginally 2 (two) times daily. 70 g 0  . NONFORMULARY OR COMPOUNDED ITEM Estradiol vaginal cream 0.02% S: insert appful twice weekly 24 each 3  .  Phosphatidylserine-DHA-EPA (VAYACOG) 100-19.5-6.5 MG CAPS Take 1 capsule by mouth daily. 30 capsule 11  . predniSONE (DELTASONE) 10 MG tablet TAKE 4TABS BY MOUTH X3DAYS, 3TABS X4DAYS, 2TABS X4DAYS, 1TAB X6DAYS, THEN STOP 38 tablet 1  . vitamin B-12 (CYANOCOBALAMIN) 1000 MCG tablet Take 1,000 mcg by mouth daily.      . Vitamin D, Ergocalciferol, (DRISDOL) 50000 units CAPS capsule Take one tablet weekly for 12 weeks then start OTC vitamin d 2,000 units 12 capsule 0  . zolpidem (AMBIEN) 10 MG tablet Take 0.5-1 tablets (5-10 mg total) by mouth at bedtime as needed for sleep. 30 tablet 2   No facility-administered medications prior to visit.     ROS Review of Systems  Constitutional: Negative for activity change, appetite change, chills, fatigue and unexpected weight change.  HENT: Negative for congestion, mouth sores and sinus pressure.   Eyes: Negative for visual disturbance.  Respiratory: Negative for cough and chest tightness.   Gastrointestinal: Negative for abdominal pain and nausea.  Genitourinary: Negative for difficulty urinating, frequency and vaginal pain.  Musculoskeletal: Negative for back pain and gait problem.  Skin: Negative for pallor and rash.  Neurological: Negative for dizziness, tremors, weakness, numbness and headaches.  Psychiatric/Behavioral: Negative for confusion and sleep disturbance. The patient is nervous/anxious.     Objective:  BP 128/70   Pulse (!) 112  Wt 197 lb (89.4 kg)   SpO2 95%   BMI 33.81 kg/m   BP Readings from Last 3 Encounters:  07/15/16 128/70  06/16/16 138/82  04/15/16 130/70    Wt Readings from Last 3 Encounters:  07/15/16 197 lb (89.4 kg)  06/16/16 200 lb (90.7 kg)  04/15/16 200 lb (90.7 kg)    Physical Exam  Constitutional: She appears well-developed. No distress.  HENT:  Head: Normocephalic.  Right Ear: External ear normal.  Left Ear: External ear normal.  Nose: Nose normal.  Mouth/Throat: Oropharynx is clear and moist.    Eyes: Conjunctivae are normal. Pupils are equal, round, and reactive to light. Right eye exhibits no discharge. Left eye exhibits no discharge.  Neck: Normal range of motion. Neck supple. No JVD present. No tracheal deviation present. No thyromegaly present.  Cardiovascular: Normal rate, regular rhythm and normal heart sounds.   Pulmonary/Chest: No stridor. No respiratory distress. She has no wheezes.  Abdominal: Soft. Bowel sounds are normal. She exhibits no distension and no mass. There is no tenderness. There is no rebound and no guarding.  Musculoskeletal: She exhibits tenderness. She exhibits no edema.  Lymphadenopathy:    She has no cervical adenopathy.  Neurological: She displays normal reflexes. No cranial nerve deficit. She exhibits normal muscle tone. Coordination normal.  Skin: No rash noted. No erythema.  Psychiatric: Her behavior is normal. Judgment and thought content normal.  LS tender Obese   Lab Results  Component Value Date   WBC 8.1 10/20/2014   HGB 14.9 10/20/2014   HCT 43.7 10/20/2014   PLT 310.0 10/20/2014   GLUCOSE 94 10/20/2014   CHOL 221 (H) 10/20/2014   TRIG 140.0 10/20/2014   HDL 54.00 10/20/2014   LDLDIRECT 161.3 06/17/2011   LDLCALC 139 (H) 10/20/2014   ALT 18 10/20/2014   AST 17 10/20/2014   NA 138 10/20/2014   K 4.2 10/20/2014   CL 105 10/20/2014   CREATININE 0.68 10/20/2014   BUN 18 10/20/2014   CO2 27 10/20/2014   TSH 2.10 10/20/2014   INR 1.76 (H) 08/29/2009    Dg Knee Bilateral Standing Ap  Result Date: 02/25/2015 CLINICAL DATA:  Bilateral knee pain, no acute injury EXAM: BILATERAL KNEES STANDING - 1 VIEW COMPARISON:  None. FINDINGS: Standing frontal views of the knees show very little degenerative joint disease for age. There may be slight loss of medial joint spaces with minimal spurring present. No acute abnormality is seen. 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 No acute abnormality.  Electronically Signed   By: Ivar Drape M.D.   On: 02/25/2015 14:35    Assessment & Plan:   There are no diagnoses linked to this encounter. I am having Ms. Paolella maintain her vitamin B-12, cholecalciferol, Biotin, clobetasol cream, Ascorbic Acid (VITAMIN C PO), Acetaminophen (TYLENOL ARTHRITIS PAIN PO), estradiol, ergocalciferol, erythromycin, amoxicillin, diazepam, zolpidem, Phosphatidylserine-DHA-EPA, cyclobenzaprine, celecoxib, HYDROcodone-acetaminophen, predniSONE, clobetasol cream, metroNIDAZOLE, NONFORMULARY OR COMPOUNDED ITEM, and Vitamin D (Ergocalciferol).  No orders of the defined types were placed in this encounter.    Follow-up: No Follow-up on file.  [name], MD

## 2016-07-15 NOTE — Assessment & Plan Note (Signed)
Wt Readings from Last 3 Encounters:  07/15/16 197 lb (89.4 kg)  06/16/16 200 lb (90.7 kg)  04/15/16 200 lb (90.7 kg)

## 2016-07-15 NOTE — Progress Notes (Signed)
Pre visit review using our clinic review tool, if applicable. No additional management support is needed unless otherwise documented below in the visit note. 

## 2016-09-12 ENCOUNTER — Other Ambulatory Visit: Payer: Self-pay | Admitting: Gynecology

## 2016-11-16 ENCOUNTER — Ambulatory Visit (INDEPENDENT_AMBULATORY_CARE_PROVIDER_SITE_OTHER): Payer: Medicare Other | Admitting: Internal Medicine

## 2016-11-16 ENCOUNTER — Encounter: Payer: Self-pay | Admitting: Internal Medicine

## 2016-11-16 VITALS — BP 146/82 | HR 83 | Temp 97.5°F | Ht 64.0 in | Wt 194.1 lb

## 2016-11-16 DIAGNOSIS — Z23 Encounter for immunization: Secondary | ICD-10-CM | POA: Diagnosis not present

## 2016-11-16 DIAGNOSIS — G5732 Lesion of lateral popliteal nerve, left lower limb: Secondary | ICD-10-CM | POA: Diagnosis not present

## 2016-11-16 DIAGNOSIS — E6609 Other obesity due to excess calories: Secondary | ICD-10-CM | POA: Diagnosis not present

## 2016-11-16 DIAGNOSIS — Z6834 Body mass index (BMI) 34.0-34.9, adult: Secondary | ICD-10-CM

## 2016-11-16 DIAGNOSIS — E559 Vitamin D deficiency, unspecified: Secondary | ICD-10-CM | POA: Diagnosis not present

## 2016-11-16 DIAGNOSIS — M5440 Lumbago with sciatica, unspecified side: Secondary | ICD-10-CM | POA: Diagnosis not present

## 2016-11-16 DIAGNOSIS — Z0000 Encounter for general adult medical examination without abnormal findings: Secondary | ICD-10-CM

## 2016-11-16 DIAGNOSIS — M170 Bilateral primary osteoarthritis of knee: Secondary | ICD-10-CM

## 2016-11-16 DIAGNOSIS — E538 Deficiency of other specified B group vitamins: Secondary | ICD-10-CM

## 2016-11-16 DIAGNOSIS — E66811 Obesity, class 1: Secondary | ICD-10-CM

## 2016-11-16 MED ORDER — HYDROCODONE-ACETAMINOPHEN 5-325 MG PO TABS: 1.0000 | ORAL_TABLET | ORAL | 0 refills | Status: DC | PRN

## 2016-11-16 NOTE — Assessment & Plan Note (Signed)
On Vit D 

## 2016-11-16 NOTE — Assessment & Plan Note (Signed)
Chronic pain 

## 2016-11-16 NOTE — Assessment & Plan Note (Signed)
On B12 

## 2016-11-16 NOTE — Progress Notes (Signed)
Subjective:  Patient ID: [name], female    DOB: 17-Jun-1941  Age: 76 y.o. MRN: [id number]  CC: No chief complaint on file.   HPI DANEAN MARNER presents for LBP, arthralgia, L lat leg pain f/u  Outpatient Medications Prior to Visit  Medication Sig Dispense Refill  . Acetaminophen (TYLENOL ARTHRITIS PAIN PO) Take by mouth.    Marland Kitchen amoxicillin (AMOXIL) 500 MG capsule TAKE 4 CAPSULES (2,000 MG TOTAL) BY MOUTH DAILY. FOR DENTAL VISITS. 20 capsule 2  . Ascorbic Acid (VITAMIN C PO) Take by mouth daily.    . Biotin 1000 MCG tablet Take 2,000 mcg by mouth 3 (three) times daily.    . celecoxib (CELEBREX) 100 MG capsule Take 1 capsule (100 mg total) by mouth 2 (two) times daily. 60 capsule 0  . Cholecalciferol (VITAMIN D3) 1000 UNITS tablet Take 2,000 Units by mouth daily.      . clobetasol cream (TEMOVATE) 5.57 % Apply 1 application topically 2 (two) times a week.    . diazepam (VALIUM) 5 MG tablet Take 1 tablet (5 mg total) by mouth 2 (two) times daily as needed for anxiety or muscle spasms. 60 tablet 2  . estradiol (ESTRACE) 0.1 MG/GM vaginal cream Place 3.22 Applicatorfuls vaginally 2 (two) times a week. 42.5 g 8  . HYDROcodone-acetaminophen (NORCO) 5-325 MG tablet Take 1 tablet by mouth every 4 (four) hours as needed for severe pain. 30 tablet 0  . NONFORMULARY OR COMPOUNDED ITEM Estradiol vaginal cream 0.02% S: insert appful twice weekly 24 each 3  . vitamin B-12 (CYANOCOBALAMIN) 1000 MCG tablet Take 1,000 mcg by mouth daily.      . cyclobenzaprine (FLEXERIL) 10 MG tablet Take 1 tablet (10 mg total) by mouth at bedtime as needed for muscle spasms. (Patient not taking: Reported on 07/15/2016) 14 tablet 0  . ergocalciferol (VITAMIN D2) 50000 UNITS capsule Take 1 capsule (50,000 Units total) by mouth once a week. 6 capsule 0  . erythromycin ophthalmic ointment Place 1 application into both eyes 2 (two) times daily. (Patient not taking: Reported on 07/15/2016) 3.5 g 0  . metroNIDAZOLE  (METROGEL) 0.75 % vaginal gel Place 1 Applicatorful vaginally 2 (two) times daily. (Patient not taking: Reported on 07/15/2016) 70 g 0  . Phosphatidylserine-DHA-EPA (VAYACOG) 100-19.5-6.5 MG CAPS Take 1 capsule by mouth daily. 30 capsule 11  . Vitamin D, Ergocalciferol, (DRISDOL) 50000 units CAPS capsule Take one tablet weekly for 12 weeks then start OTC vitamin d 2,000 units 12 capsule 0  . zolpidem (AMBIEN) 10 MG tablet Take 0.5-1 tablets (5-10 mg total) by mouth at bedtime as needed for sleep. 30 tablet 2   No facility-administered medications prior to visit.     ROS Review of Systems  Constitutional: Negative for activity change, appetite change, chills, fatigue and unexpected weight change.  HENT: Negative for congestion, mouth sores and sinus pressure.   Eyes: Negative for visual disturbance.  Respiratory: Negative for cough and chest tightness.   Gastrointestinal: Negative for abdominal pain and nausea.  Genitourinary: Negative for difficulty urinating, frequency and vaginal pain.  Musculoskeletal: Negative for back pain and gait problem.  Skin: Negative for pallor and rash.  Neurological: Negative for dizziness, tremors, weakness, numbness and headaches.  Psychiatric/Behavioral: Negative for confusion, sleep disturbance and suicidal ideas.    Objective:  BP (!) 146/82 (BP Location: Left Arm, Patient Position: Sitting, Cuff Size: Normal)   Pulse 83   Temp 97.5 F (36.4 C) (Oral)   Ht 5\' 4"  (1.626  m)   Wt 194 lb 1.9 oz (88.1 kg)   SpO2 98%   BMI 33.32 kg/m   BP Readings from Last 3 Encounters:  11/16/16 (!) 146/82  07/15/16 128/70  06/16/16 138/82    Wt Readings from Last 3 Encounters:  11/16/16 194 lb 1.9 oz (88.1 kg)  07/15/16 197 lb (89.4 kg)  06/16/16 200 lb (90.7 kg)    Physical Exam  Constitutional: She appears well-developed. No distress.  HENT:  Head: Normocephalic.  Right Ear: External ear normal.  Left Ear: External ear normal.  Nose: Nose normal.    Mouth/Throat: Oropharynx is clear and moist.  Eyes: Conjunctivae are normal. Pupils are equal, round, and reactive to light. Right eye exhibits no discharge. Left eye exhibits no discharge.  Neck: Normal range of motion. Neck supple. No JVD present. No tracheal deviation present. No thyromegaly present.  Cardiovascular: Normal rate, regular rhythm and normal heart sounds.   Pulmonary/Chest: No stridor. No respiratory distress. She has no wheezes.  Abdominal: Soft. Bowel sounds are normal. She exhibits no distension and no mass. There is no tenderness. There is no rebound and no guarding.  Musculoskeletal: She exhibits no edema or tenderness.  Lymphadenopathy:    She has no cervical adenopathy.  Neurological: She displays normal reflexes. No cranial nerve deficit. She exhibits normal muscle tone. Coordination normal.  Skin: No rash noted. No erythema.  Psychiatric: She has a normal mood and affect. Her behavior is normal. Judgment and thought content normal.    Lab Results  Component Value Date   WBC 8.1 10/20/2014   HGB 14.9 10/20/2014   HCT 43.7 10/20/2014   PLT 310.0 10/20/2014   GLUCOSE 94 10/20/2014   CHOL 221 (H) 10/20/2014   TRIG 140.0 10/20/2014   HDL 54.00 10/20/2014   LDLDIRECT 161.3 06/17/2011   LDLCALC 139 (H) 10/20/2014   ALT 18 10/20/2014   AST 17 10/20/2014   NA 138 10/20/2014   K 4.2 10/20/2014   CL 105 10/20/2014   CREATININE 0.68 10/20/2014   BUN 18 10/20/2014   CO2 27 10/20/2014   TSH 2.10 10/20/2014   INR 1.76 (H) 08/29/2009    Dg Knee Bilateral Standing Ap  Result Date: 02/25/2015 CLINICAL DATA:  Bilateral knee pain, no acute injury EXAM: BILATERAL KNEES STANDING - 1 VIEW COMPARISON:  None. FINDINGS: Standing frontal views of the knees show very little degenerative joint disease for age. There may be slight loss of medial joint spaces with minimal spurring present. No acute abnormality is seen. IMPRESSION: Very mild degenerative change of the knees on the  frontal projection for age. No acute abnormality. Electronically Signed   By: Ivar Drape M.D.   On: 02/25/2015 14:35    Assessment & Plan:   There are no diagnoses linked to this encounter. I have discontinued Ms. Desroches's ergocalciferol, erythromycin, zolpidem, Phosphatidylserine-DHA-EPA, cyclobenzaprine, metroNIDAZOLE, and Vitamin D (Ergocalciferol). I am also having her maintain her vitamin B-12, cholecalciferol, Biotin, clobetasol cream, Ascorbic Acid (VITAMIN C PO), Acetaminophen (TYLENOL ARTHRITIS PAIN PO), estradiol, celecoxib, NONFORMULARY OR COMPOUNDED ITEM, amoxicillin, diazepam, and HYDROcodone-acetaminophen.  No orders of the defined types were placed in this encounter.    Follow-up: No Follow-up on file.  [name], MD

## 2016-11-16 NOTE — Progress Notes (Signed)
Pre visit review using our clinic review tool, if applicable. No additional management support is needed unless otherwise documented below in the visit note. 

## 2016-11-16 NOTE — Progress Notes (Addendum)
Subjective:   [name] is a 76 y.o. female who presents for Medicare Annual (Subsequent) preventive examination.  Review of Systems:  No ROS.  Medicare Wellness Visit.  Cardiac Risk Factors include: advanced age (>38men, >42 women);dyslipidemia;hypertension;sedentary lifestyle;family history of premature cardiovascular disease Sleep patterns: gets up 1-2 times nightly to void and sleeps 4-6 hours nightly.   Reports long-term insomnia, recommended sleep tips and stress reduction tips discussed, education attached to patient's AVS  Home Safety/Smoke Alarms:  Feels safe in home. Smoke alarms in place.  Living environment; residence and Firearm Safety: 2-story house, no firearms.  Lives with son, no needs for DME at this time Seat Belt Safety/Bike Helmet: Wears seat belt.   Counseling:   Eye Exam- appointment yearly Dental- appointment every 6 months  Female:   Pap- N/A      Mammo- Last 06/15/16, benign mass       Dexa scan- Last 07/16/15        CCS- Last 06/03/17,severe diverticulosis,  recall 10 years     Objective:     Vitals: BP (!) 146/82 (BP Location: Left Arm, Patient Position: Sitting, Cuff Size: Normal)   Pulse 83   Temp 97.5 F (36.4 C) (Oral)   Ht 5\' 4"  (1.626 m)   Wt 194 lb 1.9 oz (88.1 kg)   SpO2 98%   BMI 33.32 kg/m   Body mass index is 33.32 kg/m.   Tobacco History  Smoking Status  . Former Smoker  . Packs/day: 3.00  . Years: 20.00  . Quit date: 04/19/1985  Smokeless Tobacco  . Never Used     Counseling given: Not Answered   Past Medical History:  Diagnosis Date  . Anxiety   . Chronic kidney disease 1967   hx kidney infection  . Degenerative disk disease   . Diverticulosis   . Hyperlipemia   . Hypertension    not treated  . LBP (low back pain)   . Osteoarthritis   . PONV (postoperative nausea and vomiting)   . Vitamin B deficiency   . Vitamin D deficiency    Past Surgical History:  Procedure Laterality Date  . ABDOMINAL  HYSTERECTOMY  2005   SUPRACERVICAL HYSTERECTOMY  . APPENDECTOMY    . BREAST BIOPSY Left 06/03/2013   Procedure: BREAST BIOPSY WITH NEEDLE LOCALIZATION;  Surgeon: [name], MD;  Location: [facility];  Service: General;  Laterality: Left;  . BREAST SURGERY  2000   breast reduction... BREAST BIOPSY ON OCTOBER 2014  . cataract surgery     left  . CERVICAL FUSION  1999 & 2009   C3-4 [facility]  . EYE SURGERY Left 2012   cataract  . JOINT REPLACEMENT    . SPINE SURGERY  1999&2009  . TONSILLECTOMY    . TOTAL HIP ARTHROPLASTY  (L) 2007 (R) 2011   [facility]   Family History  Problem Relation Age of Onset  . Arthritis Mother 24    polymyositis  . Hypertension Father   . Heart disease Father   . Hypertension Brother   . Cancer Brother     Oral   History  Sexual Activity  . Sexual activity: No    Outpatient Encounter Prescriptions as of 11/16/2016  Medication Sig  . Acetaminophen (TYLENOL ARTHRITIS PAIN PO) Take by mouth.  Marland Kitchen amoxicillin (AMOXIL) 500 MG capsule TAKE 4 CAPSULES (2,000 MG TOTAL) BY MOUTH DAILY. FOR DENTAL VISITS.  Marland Kitchen Ascorbic Acid (VITAMIN C PO) Take by mouth daily.  . Biotin 1000  MCG tablet Take 2,000 mcg by mouth 3 (three) times daily.  . celecoxib (CELEBREX) 100 MG capsule Take 1 capsule (100 mg total) by mouth 2 (two) times daily.  . Cholecalciferol (VITAMIN D3) 1000 UNITS tablet Take 2,000 Units by mouth daily.    . clobetasol cream (TEMOVATE) 3.29 % Apply 1 application topically 2 (two) times a week.  . diazepam (VALIUM) 5 MG tablet Take 1 tablet (5 mg total) by mouth 2 (two) times daily as needed for anxiety or muscle spasms.  Marland Kitchen estradiol (ESTRACE) 0.1 MG/GM vaginal cream Place 9.24 Applicatorfuls vaginally 2 (two) times a week.  Marland Kitchen HYDROcodone-acetaminophen (NORCO) 5-325 MG tablet Take 1 tablet by mouth every 4 (four) hours as needed for severe pain.  . NONFORMULARY OR COMPOUNDED ITEM Estradiol vaginal cream 0.02% S: insert appful twice weekly  . vitamin B-12  (CYANOCOBALAMIN) 1000 MCG tablet Take 1,000 mcg by mouth daily.    . [DISCONTINUED] HYDROcodone-acetaminophen (NORCO) 5-325 MG tablet Take 1 tablet by mouth every 4 (four) hours as needed for severe pain.  . [DISCONTINUED] cyclobenzaprine (FLEXERIL) 10 MG tablet Take 1 tablet (10 mg total) by mouth at bedtime as needed for muscle spasms. (Patient not taking: Reported on 07/15/2016)  . [DISCONTINUED] ergocalciferol (VITAMIN D2) 50000 UNITS capsule Take 1 capsule (50,000 Units total) by mouth once a week.  . [DISCONTINUED] erythromycin ophthalmic ointment Place 1 application into both eyes 2 (two) times daily. (Patient not taking: Reported on 07/15/2016)  . [DISCONTINUED] metroNIDAZOLE (METROGEL) 0.75 % vaginal gel Place 1 Applicatorful vaginally 2 (two) times daily. (Patient not taking: Reported on 07/15/2016)  . [DISCONTINUED] Phosphatidylserine-DHA-EPA (VAYACOG) 100-19.5-6.5 MG CAPS Take 1 capsule by mouth daily.  . [DISCONTINUED] Vitamin D, Ergocalciferol, (DRISDOL) 50000 units CAPS capsule Take one tablet weekly for 12 weeks then start OTC vitamin d 2,000 units  . [DISCONTINUED] zolpidem (AMBIEN) 10 MG tablet Take 0.5-1 tablets (5-10 mg total) by mouth at bedtime as needed for sleep.   No facility-administered encounter medications on file as of 11/16/2016.     Activities of Daily Living In your present state of health, do you have any difficulty performing the following activities: 11/16/2016  Hearing? N  Vision? N  Difficulty concentrating or making decisions? Y  Walking or climbing stairs? N  Dressing or bathing? N  Doing errands, shopping? N  Preparing Food and eating ? N  Using the Toilet? N  In the past six months, have you accidently leaked urine? N  Do you have problems with loss of bowel control? N  Managing your Medications? N  Managing your Finances? N  Housekeeping or managing your Housekeeping? N  Some recent data might be hidden    Patient Care Team: Cassandria Anger, MD as PCP - General Terrance Mass, MD (Obstetrics and Gynecology) Lafayette Dragon, MD (Inactive) as Attending Physician (Gastroenterology) Kristeen Miss, MD as Attending Physician (Neurosurgery)    Assessment:    Physical assessment deferred to PCP.  Exercise Activities and Dietary recommendations Current Exercise Habits: The patient does not participate in regular exercise at present (discussed water aerobics, chair exercise instructions provided), Exercise limited by: orthopedic condition(s)  Diet (meal preparation, eat out, water intake, caffeinated beverages, dairy products, fruits and vegetables): in general, an "unhealthy" diet, on average, 2 meals per day, on average, 1-2 servings fruit per day, on average, 1-2 servings vegetables per day, drinks 2 cups of coffee daily, 1-2 glasses of water daily, 1-2 bottles of diet green tea   Discussed low  salt, low fat/ cholesterol diet, reading food labels, increasing fruits and vegetables, increasing water intake. Encouraged patient not to skip meals but to eat small frequent meals. Diet education attached to patient's AVS    Goals    . Maintain current health status          Start to read food labels      Fall Risk Fall Risk  11/16/2016 01/26/2016 10/20/2014  Falls in the past year? No No No   Depression Screen PHQ 2/9 Scores 11/16/2016 10/20/2014  PHQ - 2 Score 0 0     Cognitive Function MMSE - Mini Mental State Exam 11/16/2016  Orientation to time 5  Orientation to Place 5  Registration 3  Attention/ Calculation 5  Recall 1  Language- name 2 objects 2  Language- repeat 1  Language- follow 3 step command 3  Language- read & follow direction 1  Write a sentence 1  Copy design 1  Total score 28        Immunization History  Administered Date(s) Administered  . Influenza Split 06/17/2011, 05/16/2012  . Influenza Whole 05/15/2009, 04/26/2013  . Influenza, High Dose Seasonal PF 05/19/2015, 04/15/2016  . Influenza,inj,Quad  PF,36+ Mos 05/14/2014  . Pneumococcal Conjugate-13 11/16/2016  . Pneumococcal Polysaccharide-23 06/04/2008   Screening Tests Health Maintenance  Topic Date Due  . TETANUS/TDAP  10/26/1958  . INFLUENZA VACCINE  03/01/2017  . MAMMOGRAM  06/15/2017  . PAP SMEAR  06/16/2017  . DEXA SCAN  Completed  . PNA vac Low Risk Adult  Completed      Plan:     Start to eat heart healthy diet (full of fruits, vegetables, whole grains, lean protein, water--limit salt, fat, and sugar intake) and increase physical activity as tolerated.  Pneumonia vaccine series completed  During the course of the visit the patient was educated and counseled about the following appropriate screening and preventive services:   Vaccines to include Pneumoccal, Influenza, Hepatitis B, Td, Zostavax, HCV  Cardiovascular Disease  Colorectal cancer screening  Bone density screening  Diabetes screening  Glaucoma screening  Mammography/PAP  Nutrition counseling   Patient Instructions (the written plan) was given to the patient.   Michiel Cowboy, RN  11/16/2016   Medical screening examination/treatment/procedure(s) were performed by non-physician practitioner and as supervising physician I was immediately available for consultation/collaboration. I agree with above. [name], MD

## 2016-11-16 NOTE — Assessment & Plan Note (Signed)
Norco prn  Potential benefits of a long term opioids use as well as potential risks (i.e. addiction risk, apnea etc) and complications (i.e. Somnolence, constipation and others) were explained to the patient and were aknowledged. 

## 2016-11-16 NOTE — Assessment & Plan Note (Signed)
Discussed pressure relief

## 2016-11-16 NOTE — Patient Instructions (Addendum)
[facility]:   Start to eat heart healthy diet (full of fruits, vegetables, whole grains, lean protein, water--limit salt, fat, and sugar intake) and increase physical activity as tolerated.   Carbohydrate Counting for Diabetes Mellitus, Adult Carbohydrate counting is a method for keeping track of how many carbohydrates you eat. Eating carbohydrates naturally increases the amount of sugar (glucose) in the blood. Counting how many carbohydrates you eat helps keep your blood glucose within normal limits, which helps you manage your diabetes (diabetes mellitus). It is important to know how many carbohydrates you can safely have in each meal. This is different for every person. A diet and nutrition specialist (registered dietitian) can help you make a meal plan and calculate how many carbohydrates you should have at each meal and snack. Carbohydrates are found in the following foods: Grains, such as breads and cereals. Dried beans and soy products. Starchy vegetables, such as potatoes, peas, and corn. Fruit and fruit juices. Milk and yogurt. Sweets and snack foods, such as cake, cookies, candy, chips, and soft drinks. How do I count carbohydrates? There are two ways to count carbohydrates in food. You can use either of the methods or a combination of both. Reading "Nutrition Facts" on packaged food  The "Nutrition Facts" list is included on the labels of almost all packaged foods and beverages in the [place] It includes: The serving size. Information about nutrients in each serving, including the grams (g) of carbohydrate per serving. To use the "Nutrition Facts": Decide how many servings you will have. Multiply the number of servings by the number of carbohydrates per serving. The resulting number is the total amount of carbohydrates that you will be having. Learning standard serving sizes of other foods  When you eat foods containing carbohydrates that are not packaged or do not  include "Nutrition Facts" on the label, you need to measure the servings in order to count the amount of carbohydrates: Measure the foods that you will eat with a food scale or measuring cup, if needed. Decide how many standard-size servings you will eat. Multiply the number of servings by 15. Most carbohydrate-rich foods have about 15 g of carbohydrates per serving. For example, if you eat 8 oz (170 g) of strawberries, you will have eaten 2 servings and 30 g of carbohydrates (2 servings x 15 g = 30 g). For foods that have more than one food mixed, such as soups and casseroles, you must count the carbohydrates in each food that is included. The following list contains standard serving sizes of common carbohydrate-rich foods. Each of these servings has about 15 g of carbohydrates:  hamburger bun or  English muffin.  oz (15 mL) syrup.  oz (14 g) jelly. 1 slice of bread. 1 six-inch tortilla. 3 oz (85 g) cooked rice or pasta. 4 oz (113 g) cooked dried beans. 4 oz (113 g) starchy vegetable, such as peas, corn, or potatoes. 4 oz (113 g) hot cereal. 4 oz (113 g) mashed potatoes or  of a large baked potato. 4 oz (113 g) canned or frozen fruit. 4 oz (120 mL) fruit juice. 4-6 crackers. 6 chicken nuggets. 6 oz (170 g) unsweetened dry cereal. 6 oz (170 g) plain fat-free yogurt or yogurt sweetened with artificial sweeteners. 8 oz (240 mL) milk. 8 oz (170 g) fresh fruit or one small piece of fruit. 24 oz (680 g) popped popcorn. Example of carbohydrate counting Sample meal  3 oz (85 g) chicken breast. 6 oz (170 g)  brown rice. 4 oz (113 g) corn. 8 oz (240 mL) milk. 8 oz (170 g) strawberries with sugar-free whipped topping. Carbohydrate calculation  Identify the foods that contain carbohydrates: Rice. Corn. Milk. Strawberries. Calculate how many servings you have of each food: 2 servings rice. 1 serving corn. 1 serving milk. 1 serving strawberries. Multiply each number of servings by  15 g: 2 servings rice x 15 g = 30 g. 1 serving corn x 15 g = 15 g. 1 serving milk x 15 g = 15 g. 1 serving strawberries x 15 g = 15 g. Add together all of the amounts to find the total grams of carbohydrates eaten: 30 g + 15 g + 15 g + 15 g = 75 g of carbohydrates total. This information is not intended to replace advice given to you by your health care provider. Make sure you discuss any questions you have with your health care provider. Document Released: 07/18/2005 Document Revised: 02/05/2016 Document Reviewed: 12/30/2015 Elsevier Interactive Patient Education  2017 [employer]. How do I read the food label?  Begin by checking the serving size and number of servings in the container. All of the nutrition information listed on the food label is based on one serving. If you eat more than one serving, you must multiply the amounts (such as calories, grams of saturated fat, or milligrams of sodium) by the number of servings.  Check the calories. Choosing foods that are low in calories can help you manage your weight.  Look at the numbers in the % Daily Value column for each listed nutrient. This gives you an idea of how much of the daily recommended amount for that nutrient is provided in one serving of the food. A daily value of 5% or less is considered low. A daily value of 20% or more is considered high.  Check the amounts for the items you should limit in your diet. These include:  Total fat.  Saturated fat.  Trans fat.  Cholesterol.  Sodium.  Check the amounts for the items you should make sure you get enough of. These include:  Dietary fiber.  Vitamins A and C.  Calcium.  Iron. What information is provided on the food label? Serving information   Serving size.  The serving size is listed in cups or pieces. The nutrient amounts listed on the food label apply to this amount of the food.  Servings per container or package.  This shows the number of servings you  can expect to get from the container or package if you follow the suggested serving size. Amount per serving   Calories.  The number of calories in one serving of the food. This information is helpful in managing weight. Low-calorie foods contain 40 calories or less. High-calorie foods contain 400 or more calories.  Calories from fat.  The number of calories that come from fat in one serving. Percent daily value  Percent daily value (shown on the label as % Daily Value) tells you what percent of the daily value for each nutrient one serving provides. The daily value is the recommended amount of the nutrient that you should get each day. For example, if 15% is listed next to dietary fiber, it means that one serving of the food will give you 15% of the recommended amount of fiber that you should get in a day. The daily values are based on a 2,000-calorie-per-day diet. You may get more or less than 2,000 calories in your diet each  day, but the % Daily Value gives you an idea of whether the food contains a high or low amount of the listed nutrient. A daily value of 5% or less is low. A daily value of 20% or more is high. Total fat  Total fat shows you the total amount of fat in one serving (listed in grams). Foods with high amounts of fat usually have higher calories and may lead to weight gain. Two of the fats that make up a portion of the total fat are included on the label:  Saturated fat.  This number is the amount of saturated fat in one serving (listed in grams). Saturated fat increases the amount of blood cholesterol and should be limited to less than 7% of total calories each day. This means that if you eat 2,000 calories each day, you should eat less than 140 calories from saturated fat.   Cholesterol  The amount of cholesterol in one serving is listed in milligrams. Cholesterol should be limited to no more than 300 mg each day. Sodium  The amount of sodium in one serving is listed in  milligrams. Most people should limit their sodium intake to 2,300 mg a day. Total carbohydrate  This number shows the amount of total carbohydrate in one serving (listed in grams). This information can help people with diabetes manage the amount of carbohydrate they eat. Two of the carbohydrates that make up a portion of the total carbohydrate are included on the label:  Dietary fiber.  The amount of dietary fiber in one serving is listed in grams. Most people should eat at least 25 g of dietary fiber each day.  Sugars.  The amount of sugar in one serving is also listed in grams. This value includes both naturally occurring sugars from fruit and milk and added sugars such as honey or table sugar. Protein  The amount of protein in one serving is listed in grams. What other important labeling is on the food package? Ingredients  Food labels will list each ingredient in the food. The first ingredient listed is the ingredient that the food has the most of. The ingredients are listed in the order of their amount by weight from highest to lowest. Food allergen labeling  Food labels may also include a food allergen warning. Listed here are ingredients that can cause allergic reactions in some people. The potential allergens are listed behind the word "Contains" or "May contain." Examples of ingredients that may be listed are wheat, dairy, eggs, soy, and nuts. If a person knows that he or she is allergic to one of these ingredients, he or she will know to avoid that food. Where to find more information:  [place] Food and Drug Administration: [employer] This information is not intended to replace advice given to you by your health care provider. Make sure you discuss any questions you have with your health care provider. Document Released: 07/18/2005 Document Revised: 03/16/2016 Document Reviewed: 06/10/2013 Elsevier Interactive Patient Education  2017 [employer]. Insomnia Insomnia is a sleep  disorder that makes it difficult to fall asleep or to stay asleep. Insomnia can cause tiredness (fatigue), low energy, difficulty concentrating, mood swings, and poor performance at work or school. There are three different ways to classify insomnia:  Difficulty falling asleep.  Difficulty staying asleep.  Waking up too early in the morning. Any type of insomnia can be long-term (chronic) or short-term (acute). Both are common. Short-term insomnia usually lasts for three months or less. Chronic insomnia  occurs at least three times a week for longer than three months. What are the causes? Insomnia may be caused by another condition, situation, or substance, such as:  Anxiety.  Certain medicines.  Gastroesophageal reflux disease (GERD) or other gastrointestinal conditions.  Asthma or other breathing conditions.  Restless legs syndrome, sleep apnea, or other sleep disorders.  Chronic pain.  Menopause. This may include hot flashes.  Stroke.  Abuse of alcohol, tobacco, or illegal drugs.  Depression.  Caffeine.  Neurological disorders, such as Alzheimer disease.  An overactive thyroid (hyperthyroidism). The cause of insomnia may not be known. What increases the risk? Risk factors for insomnia include:  Gender. Women are more commonly affected than men.  Age. Insomnia is more common as you get older.  Stress. This may involve your professional or personal life.  Income. Insomnia is more common in people with lower income.  Lack of exercise.  Irregular work schedule or night shifts.  Traveling between different time zones. What are the signs or symptoms? If you have insomnia, trouble falling asleep or trouble staying asleep is the main symptom. This may lead to other symptoms, such as:  Feeling fatigued.  Feeling nervous about going to sleep.  Not feeling rested in the morning.  Having trouble concentrating.  Feeling irritable, anxious, or depressed. How is  this treated? Treatment for insomnia depends on the cause. If your insomnia is caused by an underlying condition, treatment will focus on addressing the condition. Treatment may also include:  Medicines to help you sleep.  Counseling or therapy.  Lifestyle adjustments. Follow these instructions at home:  Take medicines only as directed by your health care provider.  Keep regular sleeping and waking hours. Avoid naps.  Keep a sleep diary to help you and your health care provider figure out what could be causing your insomnia. Include:  When you sleep.  When you wake up during the night.  How well you sleep.  How rested you feel the next day.  Any side effects of medicines you are taking.  What you eat and drink.  Make your bedroom a comfortable place where it is easy to fall asleep:  Put up shades or special blackout curtains to block light from outside.  Use a white noise machine to block noise.  Keep the temperature cool.  Exercise regularly as directed by your health care provider. Avoid exercising right before bedtime.  Use relaxation techniques to manage stress. Ask your health care provider to suggest some techniques that may work well for you. These may include:  Breathing exercises.  Routines to release muscle tension.  Visualizing peaceful scenes.  Cut back on alcohol, caffeinated beverages, and cigarettes, especially close to bedtime. These can disrupt your sleep.  Do not overeat or eat spicy foods right before bedtime. This can lead to digestive discomfort that can make it hard for you to sleep.  Limit screen use before bedtime. This includes:  Watching TV.  Using your smartphone, tablet, and computer.  Stick to a routine. This can help you fall asleep faster. Try to do a quiet activity, brush your teeth, and go to bed at the same time each night.  Get out of bed if you are still awake after 15 minutes of trying to sleep. Keep the lights down, but  try reading or doing a quiet activity. When you feel sleepy, go back to bed.  Make sure that you drive carefully. Avoid driving if you feel very sleepy.  Keep all follow-up appointments  as directed by your health care provider. This is important. Contact a health care provider if:  You are tired throughout the day or have trouble in your daily routine due to sleepiness.  You continue to have sleep problems or your sleep problems get worse. Get help right away if:  You have serious thoughts about hurting yourself or someone else. This information is not intended to replace advice given to you by your health care provider. Make sure you discuss any questions you have with your health care provider. Document Released: 07/15/2000 Document Revised: 12/18/2015 Document Reviewed: 04/18/2014 Elsevier Interactive Patient Education  2017 [employer].

## 2016-11-16 NOTE — Assessment & Plan Note (Signed)
Discussed.

## 2016-11-21 ENCOUNTER — Other Ambulatory Visit: Payer: Medicare Other

## 2016-11-21 DIAGNOSIS — E559 Vitamin D deficiency, unspecified: Secondary | ICD-10-CM

## 2016-11-22 LAB — VITAMIN D 25 HYDROXY (VIT D DEFICIENCY, FRACTURES): Vit D, 25-Hydroxy: 27 ng/mL — ABNORMAL LOW (ref 30–100)

## 2016-11-23 ENCOUNTER — Encounter: Payer: Self-pay | Admitting: Internal Medicine

## 2016-12-14 ENCOUNTER — Encounter: Payer: Self-pay | Admitting: Gynecology

## 2016-12-20 ENCOUNTER — Encounter: Payer: Self-pay | Admitting: Gynecology

## 2016-12-21 ENCOUNTER — Encounter: Payer: Self-pay | Admitting: Gynecology

## 2016-12-21 ENCOUNTER — Encounter: Payer: Self-pay | Admitting: Anesthesiology

## 2016-12-21 ENCOUNTER — Other Ambulatory Visit: Payer: Self-pay | Admitting: Radiology

## 2016-12-21 HISTORY — PX: BREAST BIOPSY: SHX20

## 2016-12-22 ENCOUNTER — Encounter: Payer: Self-pay | Admitting: Gynecology

## 2016-12-23 ENCOUNTER — Telehealth: Payer: Self-pay

## 2016-12-23 NOTE — Telephone Encounter (Signed)
Stop estradiol but use clobetasol extrarenally and Replens vaginal moisturizer inside the vagina 2-3 times a week as needed.

## 2016-12-23 NOTE — Telephone Encounter (Signed)
Patient said she was diagnosed with Stage 1 breast cancer yesterday.  She said the uses Estradiol Cream and Clobetasol cream and questioned if she should discontinue either of these in light of this diagnosis?

## 2016-12-23 NOTE — Telephone Encounter (Signed)
Patient advised.

## 2016-12-28 ENCOUNTER — Ambulatory Visit: Payer: Self-pay | Admitting: General Surgery

## 2016-12-28 DIAGNOSIS — Z170 Estrogen receptor positive status [ER+]: Principal | ICD-10-CM

## 2016-12-28 DIAGNOSIS — C50412 Malignant neoplasm of upper-outer quadrant of left female breast: Secondary | ICD-10-CM

## 2016-12-30 ENCOUNTER — Telehealth: Payer: Self-pay | Admitting: *Deleted

## 2016-12-30 ENCOUNTER — Encounter: Payer: Self-pay | Admitting: Radiation Oncology

## 2016-12-30 NOTE — Telephone Encounter (Signed)
Scheduled and confirmed new pt appt on 01/04/17 at 2:30 with Dr. Burr Medico gave instructions and directions. Denies further questions at this time.

## 2017-01-02 NOTE — Progress Notes (Signed)
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  Oncology History   Cancer Staging Malignant neoplasm of upper-outer quadrant of left female breast [facility]) Staging form: Breast, AJCC 8th Edition - Clinical stage from 12/21/2016: Stage IA (cT1a, cN0, cM0, G1, ER: Positive, PR: Positive, HER2: Positive) - Signed by Truitt Merle, MD on 01/08/2017       Malignant neoplasm of upper-outer quadrant of left female breast ([place])   12/21/2016 Initial Biopsy    Left breast needle biopsy showed invasive lobular carcinoma, grade 1,      12/21/2016 Receptors her2    ER 95% positive, strong staining PR 80% positive, moderate staining HER-2 positive with copy # 7.23 and the ratio 4.54 Ki67 15%      12/21/2016 Initial Diagnosis    Malignant neoplasm of upper-outer quadrant of left female breast ([place])      12/21/2016 Mammogram    Diagnostic mammogram showed a oval mass in the upper outer quadrant of the left breast, it measures 5 mm by ultrasound. Ultrasound of the left axilla was negative.       HISTORY OF PRESENTING ILLNESS: 01/04/17 [name] 76 y.o. female is here because of a Invasive lobular Carcinoma found in the left breast by a diagnostic mammogram and breast biopsy on 12/21/16. She was referred by Dr. Marlou Starks. She saw nothing in her November 2017 screening but was seen iin her May 2018 screening.   In the Past She had a left breast resection by Dr.Strek due to her Atypical Lobular Hyperplasia in 2014 which  high risk for breast cancer. She felt her ALH lump. She had 2 total hip  replacements. She has a plate in her neck and a bulging disk. Last year she had 3 steroids shots due to that. Her family has a history of heart problems.She recently had several dental work done. She had been using estrado but stopped when she was diagnosed with breast cancer.  She presents to the clinic today with her good friend. She is running late for Dr. Sondra Come today. She has questions about not having chemo if she does not have positive lymph nodes found in surgery. She would like to know the risk.  She has slight knee pian, tolerable. BP is normally normal. She only takes her current medication as needed. She lives alone but her son is with her temporary.    GYN HISTORY  Menarchal: 76 LMP: early 37s Contraceptive: IUD before she had her son HRT: vaginal estradol cream for many years, stopped when she was diagnosed with breast cancer in May 2018. GP: G1P1, one son, had him at 1    CURRENT THERAPY: PENDING Surgery    MEDICAL HISTORY:  Past Medical History:  Diagnosis Date  . Anxiety   . Degenerative disk disease   . Diverticulosis   . Hyperlipemia   . Hypertension    not treated  . LBP (low back pain)   . Osteoarthritis   . PONV (postoperative nausea and vomiting)   . Vitamin B deficiency   . Vitamin D deficiency     SURGICAL HISTORY: Past Surgical History:  Procedure Laterality Date  . ABDOMINAL HYSTERECTOMY  2005   SUPRACERVICAL HYSTERECTOMY  . APPENDECTOMY    . BREAST BIOPSY Left 06/03/2013   Procedure: BREAST BIOPSY WITH NEEDLE LOCALIZATION;  Surgeon: [name], MD;  Location: [place];  Service: General;  Laterality: Lef also lumpectomy;  . BREAST BIOPSY Left 12/21/2016  . BREAST SURGERY  2000   breast reduction... BREAST BIOPSY ON OCTOBER 2014  . cataract surgery Bilateral   . CERVICAL FUSION  1999 & 2009   C3-4 [place]  . EYE SURGERY Left 2012   cataract  . JOINT REPLACEMENT    . SPINE SURGERY  1999&2009  . TONSILLECTOMY    . TOTAL HIP  ARTHROPLASTY  (L) 2007 (R) 2011   [place]    SOCIAL HISTORY: Social History   Social History  . Marital status: Single    Spouse name: N/A  . Number of children: 1  . Years of education: N/A   Occupational History  . Retired -    .  Retired   Social History Main Topics  . Smoking status: Former Smoker    Packs/day: 3.00    Years: 20.00    Quit date: 04/19/1985  . Smokeless tobacco: Never Used  . Alcohol use Yes     Comment: occasionally   . Drug use: No  . Sexual activity: No   Other Topics Concern  . Not on file   Social History Narrative   Regular exercise - NO             FAMILY HISTORY: Family History  Problem Relation Age of Onset  . Arthritis Mother 14       polymyositis  . Hypertension Father   . Heart disease Father   . Hypertension Brother   . Cancer Brother        Oral    ALLERGIES:  is allergic to augmentin [amoxicillin-pot clavulanate]; cymbalta [duloxetine hcl]; ezetimibe-simvastatin; and phentermine hcl.  MEDICATIONS:  Current Outpatient Prescriptions  Medication Sig Dispense Refill  . Acetaminophen (TYLENOL ARTHRITIS PAIN PO) Take by mouth.    Marland Kitchen amoxicillin (AMOXIL) 500 MG capsule TAKE 4 CAPSULES (2,000 MG TOTAL) BY MOUTH DAILY. FOR DENTAL VISITS. (Patient not taking: Reported on 01/04/2017) 20 capsule 2  . Ascorbic Acid (VITAMIN C PO) Take by mouth daily.    . Biotin 1000 MCG tablet Take 2,000 mcg by mouth 3 (three) times daily.    . Cholecalciferol (VITAMIN D3) 1000 UNITS tablet Take 2,000 Units by mouth daily.      . clobetasol cream (TEMOVATE) 2.35 % Apply 1 application topically 2 (two) times a week.    . diazepam (VALIUM) 5 MG tablet Take 1 tablet (5 mg total) by mouth 2 (two) times daily as needed for anxiety or muscle spasms. 60 tablet 2  . HYDROcodone-acetaminophen (NORCO) 5-325 MG tablet Take 1 tablet by mouth every 4 (four) hours as needed for severe pain. 30 tablet 0  . vitamin B-12 (CYANOCOBALAMIN) 1000 MCG tablet Take 1,000 mcg  by mouth daily.      . vitamin E 1000 UNIT capsule Take 1,000 Units by mouth daily.     No current facility-administered medications for this visit.     REVIEW OF SYSTEMS:   Constitutional: Denies fevers, chills or abnormal night sweats Eyes: Denies blurriness of vision, double vision or watery eyes Ears, nose, mouth, throat, and face: Denies mucositis or sore throat Respiratory: Denies cough, dyspnea or wheezes Cardiovascular: Denies palpitation, chest discomfort or lower extremity swelling Gastrointestinal:  Denies nausea, heartburn or change in  bowel habits Skin: Denies abnormal skin rashes Lymphatics: Denies new lymphadenopathy or easy bruising Neurological:Denies numbness, tingling or new weaknesses  MSK: (+) slight occasional knee pain Behavioral/Psych: Mood is stable, no new changes  All other systems were reviewed with the patient and are negative.   PHYSICAL EXAMINATION: ECOG PERFORMANCE STATUS: 0 - Asymptomatic  Vitals:   01/04/17 1503  BP: (!) 167/84  Pulse: 94  Resp: 20  Temp: 97.9 F (36.6 C)   Filed Weights   01/04/17 1503  Weight: 195 lb 1.6 oz (88.5 kg)    GENERAL:alert, no distress and comfortable SKIN: skin color, texture, turgor are normal, no rashes or significant lesions EYES: normal, conjunctiva are pink and non-injected, sclera clear OROPHARYNX:no exudate, no erythema and lips, buccal mucosa, and tongue normal  NECK: supple, thyroid normal size, non-tender, without nodularity LYMPH:  no palpable lymphadenopathy in the cervical, axillary or inguinal LUNGS: clear to auscultation and percussion with normal breathing effort HEART: regular rate & rhythm and no murmurs and no lower extremity edema ABDOMEN:abdomen soft, non-tender and normal bowel sounds Musculoskeletal:no cyanosis of digits and no clubbing  PSYCH: alert & oriented x 3 with fluent speech NEURO: no focal motor/sensory deficits Breasts: Breast inspection showed them to be symmetrical  with no nipple discharge. Palpation of the breasts and axilla revealed no obvious mass that I could appreciate.   LABORATORY DATA:  I have reviewed the data as listed CBC Latest Ref Rng & Units 10/20/2014 07/17/2013 05/24/2013  WBC 4.0 - 10.5 K/uL 8.1 8.4 9.2  Hemoglobin 12.0 - 15.0 g/dL 14.9 14.8 15.4(H)  Hematocrit 36.0 - 46.0 % 43.7 43.3 45.9  Platelets 150.0 - 400.0 K/uL 310.0 299.0 294    CMP Latest Ref Rng & Units 10/20/2014 11/13/2013 07/17/2013  Glucose 70 - 99 mg/dL 94 93 102(H)  BUN 6 - 23 mg/dL _0 Creatinine 0.40 - 1.20 mg/dL 0.68 0.7 0.6  Sodium 135 - 145 mEq/L 138 137 138  Potassium 3.5 - 5.1 mEq/L 4.2 3.8 4.0  Chloride 96 - 112 mEq/L 105 101 106  CO2 19 - 32 mEq/L _1 Calcium 8.4 - 10.5 mg/dL 9.3 9.6 9.4  Total Protein 6.0 - 8.3 g/dL 7.3 - -  Total Bilirubin 0.2 - 1.2 mg/dL 0.4 - -  Alkaline Phos 39 - 117 U/L 75 - -  AST 0 - 37 U/L 17 - -  ALT 0 - 35 U/L 18 - -   PATHOLOGY  Diagnosis 12/21/16 Breast, left, needle core biopsy - INVASIVE MAMMARY CARCINOMA, SEE COMMENT.  Microscopic Comment The carcinoma appears grade 1. E-cadherin will be ordered and reported in an addendum. Prognostic markers will be ordered. Dr. Lyndon Code has reviewed the case. The case was called to Dr. Marcelo Baldy on 12/22/2016. Results: IMMUNOHISTOCHEMICAL AND MORPHOMETRIC ANALYSIS PERFORMED MANUALLY Estrogen Receptor: 95%, POSITIVE, STRONG STAINING INTENSITY Progesterone Receptor: 80%, POSITIVE, MODERATE STAINING INTENSITY Proliferation Marker Ki67: 15% REFERENCE RANGE ESTROGEN RECEPTOR NEGATIVE 0% POSITIVE =>1% REFERENCE RANGE PROGESTERONE RECEPTOR NEGATIVE 0% POSITIVE =>1% All controls stained appropriately FLUORESCENCE IN-SITU HYBRIDIZATION Results: HER2 - **POSITIVE** RATIO OF HER2/CEP17 SIGNALS 4.54 AVERAGE HER2 COPY NUMBER PER CELL 7.23 Reference Range: NEGATIVE HER2/CEP17 Ratio <2.0 and average HER2 copy number <4.0 EQUIVOCAL HER2/CEP17 Ratio <2.0 and average HER2 copy  number 4.0 and <6.0 1 of 3 FINAL for Micallef, Kennadie P ([id number]) ADDITIONAL INFORMATION:(continued) POSITIVE HER2/CEP17 Ratio >=2.0 or <2.0 and average HER2 copy number >=6.0  E-cadherin is negative consistent with a lobular phenotype.  RADIOGRAPHIC STUDIES:  I have personally reviewed the radiological images as listed and agreed with the findings in the report. No results found.    Diagnostic Mammogram 12/21/16 IMPRESSION: INCOMPLETE - ADDIOTINAL IMAGING EVALUATION NEEDED The oval mass in the left breast is inderterminate. An ultrasound is recommened.     ASSESSMENT & PLAN:  [name] is a 76 y.o. caucasian female who has a history of Chronic Kidney Disease, HLD, HTN, osteoarthritis, and Vitamin B and D deficiency. She is here for a consult on her newly found Invasive lobular Carcinoma in the Left Breast.   1. Medications new class of upper-outer quadrant of left breast,  Invasive Lobular Carcinoma, G1, ER 95%, PR 80%, Ki67 15%, Her2 Positive  -She Previously had Atypical Lobular Hyperplasia in 2014 which was resected. We discussed it was a high risk for breast cancer. She did not have chemoprevention with anti-estrogen medication.  --We discussed her imaging findings and the biopsy results in great details. -Giving the lobular histology and HER-2 positive disease, I would consider breast MRI for further evaluation, to better estimate the size of the tumor and ruled out multifocal disease. -Giving the early stage disease, she is likely a candidate for breast conservation. She was seen by Dr. Marlou Starks and likely will proceed with surgery soon.  -We discussed the risk of recurrence after surgery. We discussed that HER-2 positive breast cancer is more aggressive, and risk of recurrence is higher than HER-2 negative disease. However given her very small size of her tumor, strong ER/PR positivity, lobular histology, low Ki-67, I think her risk of recurrence is probably moderate to  low. -We discussed the role of adjuvant chemotherapy for HER-2 positive disease. If her tumor is more than 1 cm or lymph nodes positive on the surgical sample, I would recommend adjuvant chemotherapy with anti-HER-2 therapy. If tumor <=21m, I do not think she needs chemotherapy. If tumor 6-121mwith negative node, I would consider mammaprint.  -Patient is very reluctant to consider adjuvant chemotherapy.  -Since the decision of adjuvant chemotherapy can not be made before surgery, I do not recommend a port placement during her breast surgery  -Giving the strong ER and PR expression in her postmenopausal status, I recommend adjuvant endocrine therapy with aromatase inhibitor for a total of 5-7 years to reduce the risk of cancer recurrence. Potential benefits and side effects were discussed with patient and she is interested. -She was also seen by radiation oncologist Dr. KiSondra Comeoday to discuss adjuvant breast radiation. -We also discussed the breast cancer surveillance after her surgery. She will continue annual screening mammogram, self exam, and a routine office visit with lab and exam with [place]-I encouraged her to have healthy diet and exercise regularly.   2. HTN, HLD, CKD, arthritis -She'll follow-up with her primary care physician  PLAN:  -consider b/l breast MRI w wo contrast, will discuss with Dr. ToMarlou StarksPt is very reluctant to have adjuvant chemotherapy, we'll hold port placement during her breast surgery. -she will have lab work as part of her pre-op work -labs and f/u 2 weeks after surgery to finalize her adjuvant therapy.   No orders of the defined types were placed in this encounter.   All questions were answered. The patient knows to call the clinic with any problems, questions or concerns. I spent 55 minutes counseling the patient face to face. The total time spent in the appointment was 60 minutes and more than 50% was on counseling.   This document serves as a record  of  services personally performed by Truitt Merle, MD. It was created on her behalf by Joslyn Devon, a trained medical scribe. The creation of this record is based on the scribe's personal observations and the provider's statements to them. This document has been checked and approved by the attending provider.      Truitt Merle, MD 01/04/2017   Addendum Pt called and requested to see Dr. Jana Hakim for second opinion about adjuvant chemotherapy. I'll arrange.  Truitt Merle  01/06/2017

## 2017-01-02 NOTE — Progress Notes (Signed)
Location of Breast Cancer: upper outer quadrant of left breast  Histology per Pathology Report:   12/21/16 Diagnosis Breast, left, needle core biopsy - INVASIVE MAMMARY CARCINOMA, SEE COMMENT  Receptor Status: ER(95%), PR (80%), Her2-neu (positive), Ki-(15%)  Did patient present with symptoms (if so, please note symptoms) or was this found on screening mammography?: screening mammogram  Past/Anticipated interventions by surgeon, if any: plans for left breast lumpectomy - Dr. Marlou Starks  Past/Anticipated interventions by medical oncology, if any: saw Dr. Burr Medico today  Lymphedema issues, if any:  no    Pain issues, if any:  Yes - has occasional back pain from degenerative disc disease  SAFETY ISSUES:  Prior radiation? no  Pacemaker/ICD? no  Possible current pregnancy?no  Is the patient on methotrexate? no  Current Complaints / other details:  Patient is here with her friend. Vitals were taken in Dr. Ernestina Penna office today: bp 167/84, hr 94, rr 20 and temperature 97.9.  Wt Readings from Last 3 Encounters:  01/04/17 195 lb 1.6 oz (88.5 kg)  11/16/16 194 lb 1.9 oz (88.1 kg)  07/15/16 197 lb (89.4 kg)        Jacqulyn Liner, RN 01/02/2017,4:05 PM

## 2017-01-04 ENCOUNTER — Ambulatory Visit (HOSPITAL_BASED_OUTPATIENT_CLINIC_OR_DEPARTMENT_OTHER): Payer: Medicare Other | Admitting: Hematology

## 2017-01-04 ENCOUNTER — Encounter: Payer: Self-pay | Admitting: Hematology

## 2017-01-04 ENCOUNTER — Ambulatory Visit
Admission: RE | Admit: 2017-01-04 | Discharge: 2017-01-04 | Disposition: A | Discharge status: Home / Self Care | Payer: Medicare Other | Source: Ambulatory Visit | Attending: Radiation Oncology | Admitting: Radiation Oncology

## 2017-01-04 ENCOUNTER — Encounter: Payer: Self-pay | Admitting: Radiation Oncology

## 2017-01-04 VITALS — BP 167/84 | HR 94 | Temp 97.9°F | Resp 20 | Ht 64.0 in | Wt 195.1 lb

## 2017-01-04 DIAGNOSIS — I10 Essential (primary) hypertension: Secondary | ICD-10-CM

## 2017-01-04 DIAGNOSIS — Z170 Estrogen receptor positive status [ER+]: Secondary | ICD-10-CM | POA: Diagnosis present

## 2017-01-04 DIAGNOSIS — N189 Chronic kidney disease, unspecified: Secondary | ICD-10-CM

## 2017-01-04 DIAGNOSIS — Z9889 Other specified postprocedural states: Secondary | ICD-10-CM | POA: Insufficient documentation

## 2017-01-04 DIAGNOSIS — C50412 Malignant neoplasm of upper-outer quadrant of left female breast: Secondary | ICD-10-CM | POA: Diagnosis present

## 2017-01-04 DIAGNOSIS — Z79899 Other long term (current) drug therapy: Secondary | ICD-10-CM | POA: Diagnosis not present

## 2017-01-04 DIAGNOSIS — Z90710 Acquired absence of both cervix and uterus: Secondary | ICD-10-CM | POA: Insufficient documentation

## 2017-01-04 NOTE — Progress Notes (Signed)
Please see the Nurse Progress Note in the MD Initial Consult Encounter for this patient. 

## 2017-01-04 NOTE — Progress Notes (Signed)
Radiation Oncology         [phone]) [phone] ________________________________  Initial Outpatient Consultation  Name: [name] MRN: [id number]  Date: 01/04/2017  DOB: May 23, 1941  WU:[id number], Evie Lacks, MD  Jovita Kussmaul, MD   REFERRING PHYSICIAN: Autumn Messing III, MD  DIAGNOSIS: The encounter diagnosis was Malignant neoplasm of upper-outer quadrant of left breast in female, estrogen receptor positive ([place]).   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 female who had a screening mammogram on 06/13/16 that showed a 5 mm mass in the UOQ left breast middle depth. Diagnotic left breast mammogram on 06/17/16 showed a 6 mm nodule in the left breast 1:00 position that was indeterminate. US of the left breast on 06/17/2016 revealed a 4 mm round probable oil cyst 1:00 position. Follow up diagnostic mammogram in the left breast on 12/20/2016 showed an oval mass with an indistinct margin in the UOQ middle depth that had increased in size.  Biopsy on 12/21/2016 of the left breast showed grade 1 invasive lobular carcinoma. (ER 95% postive, PR 80% positive, HER2 positive, Ki67 15%)  The patient saw Dr. Burr Medico earlier today and the patient presents to the clinic with a female friend today to discuss radiation for the management of her disease.  Of note the patient had a left lumpectomy on 06/03/2013 that revealed a fibroadenoma with atypical lobular hyperplasia and fibroadenoma was broadly present at anterior medial margin.  PREVIOUS RADIATION THERAPY: No  PAST MEDICAL HISTORY:  has a past medical history of Anxiety; Degenerative disk disease; Diverticulosis; Hyperlipemia; Hypertension; LBP (low back pain); Osteoarthritis; PONV (postoperative nausea and vomiting); Vitamin B deficiency; and Vitamin D deficiency.    PAST SURGICAL HISTORY: Past Surgical History:  Procedure Laterality Date  .  ABDOMINAL HYSTERECTOMY  2005   SUPRACERVICAL HYSTERECTOMY  . APPENDECTOMY    . BREAST BIOPSY Left 06/03/2013   Procedure: BREAST BIOPSY WITH NEEDLE LOCALIZATION;  Surgeon: [name], MD;  Location: [facility];  Service: General;  Laterality: Lef also lumpectomy;  . BREAST BIOPSY Left 12/21/2016  . BREAST SURGERY  2000   breast reduction... BREAST BIOPSY ON OCTOBER 2014  . cataract surgery Bilateral   . CERVICAL FUSION  1999 & 2009   C3-4 Elsner  . EYE SURGERY Left 2012   cataract  . JOINT REPLACEMENT    . SPINE SURGERY  1999&2009  . TONSILLECTOMY    . TOTAL HIP ARTHROPLASTY  (L) 2007 (R) 2011   [facility]    FAMILY HISTORY: family history includes Arthritis (age of onset: 82) in her mother; Cancer in her brother; Heart disease in her father; Hypertension in her brother and father.  SOCIAL HISTORY:  reports that she quit smoking about 31 years ago. She has a 60.00 pack-year smoking history. She has never used smokeless tobacco. She reports that she drinks alcohol. She reports that she does not use drugs.  ALLERGIES: Augmentin [amoxicillin-pot clavulanate]; Cymbalta [duloxetine hcl]; Ezetimibe-simvastatin; and Phentermine hcl  MEDICATIONS:  Current Outpatient Prescriptions  Medication Sig Dispense Refill  . Acetaminophen (TYLENOL ARTHRITIS PAIN PO) Take by mouth.    . Ascorbic Acid (VITAMIN C PO) Take by mouth daily.    . Biotin 1000 MCG tablet Take 2,000 mcg by mouth 3 (three) times daily.    . Cholecalciferol (VITAMIN D3) 1000 UNITS tablet Take 2,000 Units by mouth daily.      . clobetasol  cream (TEMOVATE) 5.46 % Apply 1 application topically 2 (two) times a week.    . diazepam (VALIUM) 5 MG tablet Take 1 tablet (5 mg total) by mouth 2 (two) times daily as needed for anxiety or muscle spasms. 60 tablet 2  . HYDROcodone-acetaminophen (NORCO) 5-325 MG tablet Take 1 tablet by mouth every 4 (four) hours as needed for severe pain. 30 tablet 0  . vitamin B-12 (CYANOCOBALAMIN) 1000 MCG  tablet Take 1,000 mcg by mouth daily.      . vitamin E 1000 UNIT capsule Take 1,000 Units by mouth daily.    Marland Kitchen amoxicillin (AMOXIL) 500 MG capsule TAKE 4 CAPSULES (2,000 MG TOTAL) BY MOUTH DAILY. FOR DENTAL VISITS. (Patient not taking: Reported on 01/04/2017) 20 capsule 2   No current facility-administered medications for this encounter.     REVIEW OF SYSTEMS: On review of systems, the patient reports that she is doing well overall. She denies any chest pain, shortness of breath, cough, fevers, chills, night sweats, unintended weight changes. She denies any bowel or bladder disturbances, and denies abdominal pain, nausea or vomiting. She denies new skin lesions or concerns. She has occasional back pain from degenerative disc disease. A complete review of systems is obtained and is otherwise negative.    PHYSICAL EXAM:  Vitals - 1 value per visit 09/07/348  SYSTOLIC 093  DIASTOLIC 84  Pulse 94  Temperature 97.9  Respirations 20  Weight (lb) 195.1  Height '5\' 4"'$   BMI 33.49  VISIT REPORT    General: Alert and oriented, in no acute distress HEENT: Head is normocephalic. Extraocular movements are intact. Oropharynx is clear. Neck: Neck is supple, no palpable cervical or supraclavicular lymphadenopathy. Heart: Regular in rate and rhythm with no murmurs, rubs, or gallops. Chest: Clear to auscultation bilaterally, with no rhonchi, wheezes, or rales. Abdomen: Soft, nontender, nondistended, with no rigidity or guarding. Extremities: No cyanosis or edema. Lymphatics: see Neck Exam Skin: No concerning lesions. Musculoskeletal: symmetric strength and muscle tone throughout. Neurologic: Cranial nerves II through XII are grossly intact. No obvious focalities. Speech is fluent. Coordination is intact. Psychiatric: Judgment and insight are intact. Affect is appropriate. Breast: right breast no palpable mass or discharge, fibrocystic changes,  surgical changes in the inferior breast from her prior  reduction mammoplasty. Left breast has a faint scar in the UOQ from prior benign biopsy, faint biopsy site in the UOQ adjacent to her scar. Scars in the inferior breast from prior reduction mammoplasty.  ECOG = 1  0 - Asymptomatic (Fully active, able to carry on all predisease activities without restriction)  1 - Symptomatic but completely ambulatory (Restricted in physically strenuous activity but ambulatory and able to carry out work of a light or sedentary nature. For example, light housework, office work)  2 - Symptomatic, <50% in bed during the day (Ambulatory and capable of all self care but unable to carry out any work activities. Up and about more than 50% of waking hours)  3 - Symptomatic, >50% in bed, but not bedbound (Capable of only limited self-care, confined to bed or chair 50% or more of waking hours)  4 - Bedbound (Completely disabled. Cannot carry on any self-care. Totally confined to bed or chair)  5 - Death   Eustace Pen MM, Creech RH, Tormey DC, et al. [phone]). "Toxicity and response criteria of the [facility]". [place] Oncol. 5 (6): 649-55  LABORATORY DATA:  Lab Results  Component Value Date   WBC 8.1 10/20/2014  HGB 14.9 10/20/2014   HCT 43.7 10/20/2014   MCV 94.4 10/20/2014   PLT 310.0 10/20/2014   NEUTROABS 4.9 10/20/2014   Lab Results  Component Value Date   NA 138 10/20/2014   K 4.2 10/20/2014   CL 105 10/20/2014   CO2 27 10/20/2014   GLUCOSE 94 10/20/2014   CREATININE 0.68 10/20/2014   CALCIUM 9.3 10/20/2014      RADIOGRAPHY: No results found.    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 The patient will have breast conservation surgery consisting with a left lumpectomy with sentinel mapping. Given that the patient has triple positive disease, chemotherapy may be  indicated based on the size of the tumor (the cut off is 5 mm). Provided that chemotherapy is indicated, the patient's course of chemotherapy would then be followed by external radiotherapy to the breast followed by antiestrogen therapy. We discussed the risks, benefits, short, and long term effects of radiotherapy, and the patient is interested in proceeding. We discussed the delivery and logistics of radiotherapy. We will see her back about sooner after surgery if chemotherapy is not required and after chemotherapy if it is required.  I spent 60 minutes minutes face to face with the patient and more than 50% of that time was spent in counseling and/or coordination of care.   ------------------------------------------------  Blair Promise, PhD, MD  This document serves as a record of services personally performed by Gery Pray, MD. It was created on his behalf by Valeta Harms, a trained medical scribe. The creation of this record is based on the scribe's personal observations and the provider's statements to them. This document has been checked and approved by the attending provider.

## 2017-01-05 ENCOUNTER — Telehealth: Payer: Self-pay | Admitting: *Deleted

## 2017-01-05 NOTE — Telephone Encounter (Signed)
Received vm call from pt asking if she is entitled to a second opinion & if so would like Dr Sarajane Jews.  Message to Dr Burr Medico.  Pt call back # is [phone].  Message to Dr Burr Medico.

## 2017-01-06 ENCOUNTER — Encounter: Payer: Self-pay | Admitting: *Deleted

## 2017-01-06 ENCOUNTER — Telehealth: Payer: Self-pay | Admitting: Hematology

## 2017-01-06 ENCOUNTER — Telehealth: Payer: Self-pay | Admitting: *Deleted

## 2017-01-06 NOTE — Telephone Encounter (Signed)
Patient calling to say she would like a second opinion from dr Jana Hakim. Notified new patient coordinator.

## 2017-01-06 NOTE — Telephone Encounter (Signed)
No LOS per 6/6 

## 2017-01-08 ENCOUNTER — Encounter: Payer: Self-pay | Admitting: Hematology

## 2017-01-09 ENCOUNTER — Encounter: Payer: Self-pay | Admitting: *Deleted

## 2017-01-09 ENCOUNTER — Encounter: Payer: Self-pay | Admitting: Oncology

## 2017-01-11 ENCOUNTER — Ambulatory Visit (HOSPITAL_BASED_OUTPATIENT_CLINIC_OR_DEPARTMENT_OTHER): Payer: Medicare Other | Admitting: Oncology

## 2017-01-11 ENCOUNTER — Other Ambulatory Visit: Payer: Medicare Other

## 2017-01-11 ENCOUNTER — Telehealth: Payer: Self-pay | Admitting: *Deleted

## 2017-01-11 VITALS — BP 135/69 | HR 90 | Temp 97.8°F | Resp 20 | Ht 64.0 in | Wt 193.8 lb

## 2017-01-11 DIAGNOSIS — C50412 Malignant neoplasm of upper-outer quadrant of left female breast: Secondary | ICD-10-CM

## 2017-01-11 DIAGNOSIS — Z170 Estrogen receptor positive status [ER+]: Secondary | ICD-10-CM | POA: Diagnosis not present

## 2017-01-11 NOTE — Progress Notes (Signed)
[facility]  Telephone:([phone] Fax:([phone]) [phone]     ID: [name] DOB: 04/14/1941  MR#: [id number]  JYN#:[id number]  Patient Care Team: Christina Anger, MD as PCP - General Christina Mass, MD (Obstetrics and Gynecology) Christina Miss, MD as Attending Physician (Neurosurgery) Christina Kussmaul, MD as Consulting Physician (General Surgery) Christina Bookbinder, MD as Consulting Physician (Dermatology) Christina Cruel, MD OTHER MD:  CHIEF COMPLAINT: Estrogen receptor positive breast cancer  CURRENT TREATMENT: Awaiting definitive surgery   BREAST CANCER HISTORY: The patient had bilateral screening mammography at [facility] 06/13/2016 showing a 5 mm [name] in the left breast upper outer quadrant middle depth. This was felt to be most likely fat necrosis but additional imaging with ultrasonography was obtained the same day and confirmed a 0 point centimeter round oil cyst in the left breast at the 1:00 anterior depth. This was felt to be most likely benign but six--month follow-up diagnostic mammography of the left breast with tomography was recommended and performed 12/20/2016. The breast density was category A.. The oval [name] in the left breast upper outer quadrant had increased in size.  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  The patient has met with my partner Dr. Burr [name] and is here today as a second opinion regarding how to proceed   INTERVAL HISTORY: [name] was evaluated in the breast clinic 01/11/2017. She is seeking to establish herself in my service today  REVIEW OF SYSTEMS: There were no specific symptoms leading to the original mammogram, which was routinely scheduled. The patient  denies unusual headaches, visual changes, nausea, vomiting, stiff neck, dizziness, or gait imbalance. There has been no cough, phlegm production, or pleurisy, no chest pain or pressure, and no change in bowel or bladder habits. The patient denies fever, rash, bleeding, unexplained fatigue or unexplained weight loss. A detailed review of systems was otherwise entirely negative.   PAST MEDICAL HISTORY: Past Medical History:  Diagnosis Date  . Anxiety   . Degenerative disk disease   . Diverticulosis   . Hyperlipemia   . Hypertension    not treated  . LBP (low back pain)   . Osteoarthritis   . PONV (postoperative nausea and vomiting)   . Vitamin B deficiency   . Vitamin D deficiency     PAST SURGICAL HISTORY: Past Surgical History:  Procedure Laterality Date  . ABDOMINAL HYSTERECTOMY  2005   SUPRACERVICAL HYSTERECTOMY  . APPENDECTOMY    . BREAST BIOPSY Left 06/03/2013   Procedure: BREAST BIOPSY WITH NEEDLE LOCALIZATION;  Surgeon: Christina Lasso, MD;  Location: [facility];  Service: General;  Laterality: Lef also lumpectomy;  . BREAST BIOPSY Left 12/21/2016  . BREAST SURGERY  2000   breast reduction... BREAST BIOPSY ON OCTOBER 2014  . cataract surgery Bilateral   . CERVICAL FUSION  1999 & 2009   C3-4 Elsner  . EYE SURGERY Left 2012   cataract  . JOINT REPLACEMENT    . SPINE SURGERY  1999&2009  . TONSILLECTOMY    . TOTAL HIP ARTHROPLASTY  (L) 2007 (R) 2011   [facility]io    FAMILY HISTORY Family History  Problem Relation Age of Onset  . Arthritis Mother 58       polymyositis  . Hypertension  Father   . Heart disease Father   . Hypertension Brother   . Cancer Brother        Oral  She does not meet criteria for genetics testing   GYNECOLOGIC HISTORY:  No LMP recorded. Patient has had a hysterectomy. Menarche age 14, first live birth age 59, she is [place] P1. She stopped having periods sometime in her early 64s. She did not use hormone replacement but did use some vaginal cream at  times after menopause. 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 Her first marriage lasted only 2 years. She had no children from that marriage. Her second husband died from an aneurysm or than 30 years ago. That second marriage produced her son [name], 5 years old as of June 2018. He works for a [employer]. He is recently separated from his wife in [place] and is living with the patient temporarily. The patient does have 2 grandchildren. She attends [employer]    ADVANCED DIRECTIVES: Not in place. At the 01/11/2017 visit the patient tells me she intends to name her son as her healthcare part of attorney   HEALTH MAINTENANCE: Social History  Substance Use Topics  . Smoking status: Former Smoker    Packs/day: 3.00    Years: 20.00    Quit date: 04/19/1985  . Smokeless tobacco: Never Used  . Alcohol use Yes     Comment: occasionally      Colonoscopy:November 2008  PAP:  Bone density: 07/16/2015 showed osteopenia   Allergies  Allergen Reactions  . Augmentin [Amoxicillin-Pot Clavulanate]     diarrhea  . Cymbalta [Duloxetine Hcl]     diarrhea  . Ezetimibe-Simvastatin   . Phentermine Hcl     Current Outpatient Prescriptions  Medication Sig Dispense Refill  . Acetaminophen (TYLENOL ARTHRITIS PAIN PO) Take by mouth.    . Ascorbic Acid (VITAMIN C PO) Take by mouth daily.    . Biotin 1000 MCG tablet Take 2,000 mcg by mouth 3 (three) times daily.    . Cholecalciferol (VITAMIN D3) 1000 UNITS tablet Take 2,000 Units by mouth daily.      . clobetasol cream (TEMOVATE) 2.75 % Apply 1 application topically 2 (two) times a week.    . diazepam (VALIUM) 5 MG tablet Take 1 tablet (5 mg total) by mouth 2 (two) times daily as needed for anxiety or muscle spasms. 60 tablet 2  . HYDROcodone-acetaminophen (NORCO) 5-325 MG tablet Take 1 tablet by mouth every 4 (four) hours as  needed for severe pain. 30 tablet 0  . vitamin B-12 (CYANOCOBALAMIN) 1000 MCG tablet Take 1,000 mcg by mouth daily.      . vitamin E 1000 UNIT capsule Take 1,000 Units by mouth daily.    Marland Kitchen amoxicillin (AMOXIL) 500 MG capsule TAKE 4 CAPSULES (2,000 MG TOTAL) BY MOUTH DAILY. FOR DENTAL VISITS. (Patient not taking: Reported on 01/04/2017) 20 capsule 2   No current facility-administered medications for this visit.     OBJECTIVE: Older white woman who appears stated age 11:   01/11/17 1545  BP: 135/69  Pulse: 90  Resp: 20  Temp: 97.8 F (36.6 C)     Body [name] index is 33.27 kg/m.    ECOG FS:1 - Symptomatic but completely ambulatory  Ocular: Sclerae unicteric, pupils round and equal Ear-nose-throat: Oropharynx clear and moist Lymphatic: No cervical or supraclavicular adenopathy Lungs  no rales or rhonchi Heart regular rate and rhythm Abd soft, nontender, positive bowel sounds MSK no focal spinal tenderness, no joint edema Neuro: non-focal, well-oriented, appropriate affect Breasts: The right breast is benign. The left breast is status post recent biopsy. I do not palpate a [name]. There are no skin or nipple changes of concern. Both axillae are benign.   LAB RESULTS:  CMP     Component Value Date/Time   NA 138 10/20/2014 1405   K 4.2 10/20/2014 1405   CL 105 10/20/2014 1405   CO2 27 10/20/2014 1405   GLUCOSE 94 10/20/2014 1405   GLUCOSE 99 05/18/2006 1500   BUN 18 10/20/2014 1405   CREATININE 0.68 10/20/2014 1405   CALCIUM 9.3 10/20/2014 1405   CALCIUM 9.3 06/17/2011 1000   PROT 7.3 10/20/2014 1405   ALBUMIN 4.1 10/20/2014 1405   AST 17 10/20/2014 1405   ALT 18 10/20/2014 1405   ALKPHOS 75 10/20/2014 1405   BILITOT 0.4 10/20/2014 1405   GFRNONAA 86 (L) 05/24/2013 1304   GFRAA >90 05/24/2013 1304    No results found for: TOTALPROTELP, ALBUMINELP, A1GS, A2GS, BETS, BETA2SER, GAMS, MSPIKE, SPEI  No results found for: Nils Pyle, [facility]  Lab Results    Component Value Date   WBC 8.1 10/20/2014   NEUTROABS 4.9 10/20/2014   HGB 14.9 10/20/2014   HCT 43.7 10/20/2014   MCV 94.4 10/20/2014   PLT 310.0 10/20/2014      Chemistry      Component Value Date/Time   NA 138 10/20/2014 1405   K 4.2 10/20/2014 1405   CL 105 10/20/2014 1405   CO2 27 10/20/2014 1405   BUN 18 10/20/2014 1405   CREATININE 0.68 10/20/2014 1405      Component Value Date/Time   CALCIUM 9.3 10/20/2014 1405   CALCIUM 9.3 06/17/2011 1000   ALKPHOS 75 10/20/2014 1405   AST 17 10/20/2014 1405   ALT 18 10/20/2014 1405   BILITOT 0.4 10/20/2014 1405       No results found for: LABCA2  No components found for: [id number]  No results for input(s): INR in the last 168 hours.  Urinalysis    Component Value Date/Time   COLORURINE YELLOW 10/20/2014 1405   APPEARANCEUR CLEAR 10/20/2014 1405   LABSPEC >=1.030 (A) 10/20/2014 1405   PHURINE 5.5 10/20/2014 1405   GLUCOSEU NEGATIVE 10/20/2014 1405   HGBUR NEGATIVE 10/20/2014 1405   BILIRUBINUR SMALL (A) 10/20/2014 1405   KETONESUR TRACE (A) 10/20/2014 1405   PROTEINUR NEGATIVE 08/24/2009 0900   UROBILINOGEN 0.2 10/20/2014 1405   NITRITE NEGATIVE 10/20/2014 1405   LEUKOCYTESUR NEGATIVE 10/20/2014 1405     STUDIES: Mammographic results as well as pathology were reviewed with the patient  ELIGIBLE FOR AVAILABLE RESEARCH PROTOCOL: No  ASSESSMENT: 76 y.o. [place] woman status post left breast upper outer quadrant biopsy 12/21/2016 for a invasive lobular carcinoma, grade 1, estrogen and progesterone receptor positive, with an MIB-1 of 15%, and HER-2 amplified (triple positive).  (1) left lumpectomy and sentinel lymph node sampling pending  (2) anti-HER-2 immunotherapy plus or minus chemotherapy to be considered depending on final pathology report  (3) adjuvant radiation to follow as appropriate  (4) anti-estrogens to follow the completion of local treatment  PLAN: We spent the better part of today's  hour-long appointment discussing the biology of breast cancer in general, and the specifics of the patient's tumor in particular. Though [name] had previously met with several physicians to discuss her plan there is seeing in  her mind to be considerable confusion regarding the question of chemotherapy and this is what we focused on.  We did review the fact that her cancer appears to be very small, that it is not aggressive looking and that it is not fast growing. All those characteristics suggest she would get little or no benefit from chemotherapy.  On the other hand her cancer is HER-2 positive and this is a significant concern.  In general we do not treat T1a tumors with chemotherapy or Herceptin regardless of the prognostic profile. If her tumor proves to be between 0.6 and 1.0 cm, or internal review shows most grade 1 tumors that are estrogen receptor positive in that category have a low Oncotype score. However there are very few HER-2 positive tumors and that group so that data is suggestive, but not conclusive.  The standard of care for tumors not much greater than 1 cm would be for some chemotherapy such as for example Taxol weekly 12+ trastuzumab. We do not have data in the adjuvant setting for combining anti-HER-2 and anti-estrogen treatment. We do have that data in the metastatic setting I would be willing to offer this patient, who has a significant fear of chemotherapy, anti-estrogens and Herceptin in that situation, assuming she understands we were treating "outside the box".  She was interested in obtaining an Oncotype on her tumor. Given the discussion above I see little use for that but we will discuss that when she returns to see me a week or so after her surgery at which time we will have more definitive data to deal with.  [name] expressed a desire to switch over to my service. Our general policy is that patients have a choice and may choose their physicians so long as that choice is not  driven by discriminatory reasons. There is no evidence of that here.  Accordingly I will see [name] in approximately 3 weeks. She knows to call for any other issues that may develop before her next visit.    [name] has a good understanding of the overall plan. She agrees with it. She knows the goal of treatment in her case is cure. She will call with any problems that may develop before her next visit here.  Christina Cruel, MD   01/11/2017 8:12 PM Medical Oncology and Hematology [facility] [address] [place], [place] [address] Tel. [phone]    Fax. [phone]

## 2017-01-11 NOTE — Telephone Encounter (Signed)
Spoke with patient and confirmed new patient appointment for 01/11/17 at 330 for labs and 4pm with Dr. Jana Hakim.

## 2017-01-19 ENCOUNTER — Encounter (HOSPITAL_COMMUNITY): Payer: Self-pay | Admitting: *Deleted

## 2017-01-21 ENCOUNTER — Telehealth: Payer: Self-pay | Admitting: Oncology

## 2017-01-21 NOTE — Telephone Encounter (Signed)
lvm to inform pt of 7/18 appt per sch msg

## 2017-01-23 NOTE — Progress Notes (Signed)
Boost drink given with instructions to complete by 12 noon, dos, pt verbalized understanding.

## 2017-01-25 ENCOUNTER — Ambulatory Visit (HOSPITAL_COMMUNITY)
Admission: RE | Admit: 2017-01-25 | Discharge: 2017-01-25 | Disposition: A | Discharge status: Home / Self Care | Payer: Medicare Other | Source: Ambulatory Visit | Attending: General Surgery | Admitting: General Surgery

## 2017-01-25 ENCOUNTER — Encounter (HOSPITAL_BASED_OUTPATIENT_CLINIC_OR_DEPARTMENT_OTHER)
Admission: RE | Disposition: A | Discharge status: Home / Self Care | Payer: Self-pay | Source: Ambulatory Visit | Attending: General Surgery

## 2017-01-25 ENCOUNTER — Encounter (HOSPITAL_BASED_OUTPATIENT_CLINIC_OR_DEPARTMENT_OTHER): Payer: Self-pay | Admitting: Anesthesiology

## 2017-01-25 ENCOUNTER — Ambulatory Visit (HOSPITAL_BASED_OUTPATIENT_CLINIC_OR_DEPARTMENT_OTHER): Payer: Medicare Other | Admitting: Anesthesiology

## 2017-01-25 DIAGNOSIS — Z79899 Other long term (current) drug therapy: Secondary | ICD-10-CM | POA: Diagnosis not present

## 2017-01-25 DIAGNOSIS — D649 Anemia, unspecified: Secondary | ICD-10-CM | POA: Diagnosis not present

## 2017-01-25 DIAGNOSIS — Z87891 Personal history of nicotine dependence: Secondary | ICD-10-CM | POA: Insufficient documentation

## 2017-01-25 DIAGNOSIS — Z170 Estrogen receptor positive status [ER+]: Principal | ICD-10-CM

## 2017-01-25 DIAGNOSIS — E669 Obesity, unspecified: Secondary | ICD-10-CM | POA: Diagnosis not present

## 2017-01-25 DIAGNOSIS — Z6833 Body mass index (BMI) 33.0-33.9, adult: Secondary | ICD-10-CM | POA: Insufficient documentation

## 2017-01-25 DIAGNOSIS — C50912 Malignant neoplasm of unspecified site of left female breast: Secondary | ICD-10-CM | POA: Diagnosis present

## 2017-01-25 DIAGNOSIS — G629 Polyneuropathy, unspecified: Secondary | ICD-10-CM | POA: Diagnosis not present

## 2017-01-25 DIAGNOSIS — K219 Gastro-esophageal reflux disease without esophagitis: Secondary | ICD-10-CM | POA: Insufficient documentation

## 2017-01-25 DIAGNOSIS — M1990 Unspecified osteoarthritis, unspecified site: Secondary | ICD-10-CM | POA: Insufficient documentation

## 2017-01-25 DIAGNOSIS — F419 Anxiety disorder, unspecified: Secondary | ICD-10-CM | POA: Diagnosis not present

## 2017-01-25 DIAGNOSIS — C50412 Malignant neoplasm of upper-outer quadrant of left female breast: Secondary | ICD-10-CM

## 2017-01-25 HISTORY — PX: BREAST LUMPECTOMY WITH RADIOACTIVE SEED AND SENTINEL LYMPH NODE BIOPSY: SHX6550

## 2017-01-25 SURGERY — BREAST LUMPECTOMY WITH RADIOACTIVE SEED AND SENTINEL LYMPH NODE BIOPSY
Anesthesia: General | Site: Breast | Laterality: Left

## 2017-01-25 MED ORDER — FENTANYL CITRATE (PF) 100 MCG/2ML IJ SOLN
50.0000 ug | INTRAMUSCULAR | Status: DC | PRN
  Administered 2017-01-25 (×2): 50 ug via INTRAVENOUS

## 2017-01-25 MED ORDER — LACTATED RINGERS IV SOLN
INTRAVENOUS | Status: DC
  Administered 2017-01-25 (×2): via INTRAVENOUS

## 2017-01-25 MED ORDER — HYDROCODONE-ACETAMINOPHEN 5-325 MG PO TABS
ORAL_TABLET | ORAL | Status: AC
  Filled 2017-01-25: qty 2

## 2017-01-25 MED ORDER — DEXAMETHASONE SODIUM PHOSPHATE 10 MG/ML IJ SOLN
INTRAMUSCULAR | Status: AC
  Filled 2017-01-25: qty 1

## 2017-01-25 MED ORDER — MIDAZOLAM HCL 2 MG/2ML IJ SOLN
INTRAMUSCULAR | Status: AC
  Filled 2017-01-25: qty 2

## 2017-01-25 MED ORDER — FENTANYL CITRATE (PF) 100 MCG/2ML IJ SOLN
INTRAMUSCULAR | Status: AC
  Filled 2017-01-25: qty 2

## 2017-01-25 MED ORDER — PROPOFOL 10 MG/ML IV BOLUS
INTRAVENOUS | Status: DC | PRN
  Administered 2017-01-25: 200 mg via INTRAVENOUS

## 2017-01-25 MED ORDER — FENTANYL CITRATE (PF) 100 MCG/2ML IJ SOLN
25.0000 ug | INTRAMUSCULAR | Status: DC | PRN
  Administered 2017-01-25 (×3): 50 ug via INTRAVENOUS

## 2017-01-25 MED ORDER — PROPOFOL 10 MG/ML IV BOLUS
INTRAVENOUS | Status: AC
  Filled 2017-01-25: qty 20

## 2017-01-25 MED ORDER — EPHEDRINE 5 MG/ML INJ
INTRAVENOUS | Status: AC
  Filled 2017-01-25: qty 10

## 2017-01-25 MED ORDER — SCOPOLAMINE 1 MG/3DAYS TD PT72: 1.0000 | MEDICATED_PATCH | Freq: Once | TRANSDERMAL | Status: DC | PRN

## 2017-01-25 MED ORDER — VANCOMYCIN HCL IN DEXTROSE 1-5 GM/200ML-% IV SOLN
1000.0000 mg | INTRAVENOUS | Status: AC
  Administered 2017-01-25: 1000 mg via INTRAVENOUS

## 2017-01-25 MED ORDER — TECHNETIUM TC 99M SULFUR COLLOID FILTERED
1.0000 | Freq: Once | INTRAVENOUS | Status: AC | PRN
  Administered 2017-01-25: 1 via INTRADERMAL

## 2017-01-25 MED ORDER — HYDROMORPHONE HCL 1 MG/ML IJ SOLN
INTRAMUSCULAR | Status: AC
  Filled 2017-01-25: qty 0.5

## 2017-01-25 MED ORDER — HYDROCODONE-ACETAMINOPHEN 5-325 MG PO TABS
2.0000 | ORAL_TABLET | Freq: Once | ORAL | Status: AC
  Administered 2017-01-25: 2 via ORAL

## 2017-01-25 MED ORDER — CELECOXIB 200 MG PO CAPS
ORAL_CAPSULE | ORAL | Status: AC
  Filled 2017-01-25: qty 2

## 2017-01-25 MED ORDER — LIDOCAINE 2% (20 MG/ML) 5 ML SYRINGE
INTRAMUSCULAR | Status: AC
  Filled 2017-01-25: qty 5

## 2017-01-25 MED ORDER — METHYLENE BLUE 0.5 % INJ SOLN
INTRAVENOUS | Status: AC
  Filled 2017-01-25: qty 10

## 2017-01-25 MED ORDER — CELECOXIB 400 MG PO CAPS: 400.0000 mg | ORAL_CAPSULE | ORAL | Status: DC

## 2017-01-25 MED ORDER — PHENYLEPHRINE HCL 10 MG/ML IJ SOLN
INTRAMUSCULAR | Status: DC | PRN
  Administered 2017-01-25 (×2): 80 ug via INTRAVENOUS

## 2017-01-25 MED ORDER — CHLORHEXIDINE GLUCONATE CLOTH 2 % EX PADS: 6.0000 | MEDICATED_PAD | Freq: Once | CUTANEOUS | Status: DC

## 2017-01-25 MED ORDER — VANCOMYCIN HCL IN DEXTROSE 1-5 GM/200ML-% IV SOLN
INTRAVENOUS | Status: AC
  Filled 2017-01-25: qty 200

## 2017-01-25 MED ORDER — PROPOFOL 500 MG/50ML IV EMUL
INTRAVENOUS | Status: AC
  Filled 2017-01-25: qty 50

## 2017-01-25 MED ORDER — CEFAZOLIN SODIUM-DEXTROSE 2-4 GM/100ML-% IV SOLN
INTRAVENOUS | Status: AC
  Filled 2017-01-25: qty 100

## 2017-01-25 MED ORDER — EPHEDRINE SULFATE 50 MG/ML IJ SOLN
INTRAMUSCULAR | Status: DC | PRN
  Administered 2017-01-25: 25 mg via INTRAVENOUS

## 2017-01-25 MED ORDER — LIDOCAINE HCL (CARDIAC) 20 MG/ML IV SOLN
INTRAVENOUS | Status: DC | PRN
  Administered 2017-01-25: 30 mg via INTRAVENOUS

## 2017-01-25 MED ORDER — BUPIVACAINE-EPINEPHRINE (PF) 0.25% -1:200000 IJ SOLN
INTRAMUSCULAR | Status: DC | PRN
  Administered 2017-01-25: 20 mL

## 2017-01-25 MED ORDER — ONDANSETRON HCL 4 MG/2ML IJ SOLN: 4.0000 mg | Freq: Once | INTRAMUSCULAR | Status: DC | PRN

## 2017-01-25 MED ORDER — HYDROMORPHONE HCL 1 MG/ML IJ SOLN
0.5000 mg | INTRAMUSCULAR | Status: DC | PRN
  Administered 2017-01-25 (×2): 0.5 mg via INTRAVENOUS

## 2017-01-25 MED ORDER — SODIUM CHLORIDE 0.9 % IJ SOLN
INTRAVENOUS | Status: DC | PRN
  Administered 2017-01-25: 5 mL via INTRAMUSCULAR

## 2017-01-25 MED ORDER — ACETAMINOPHEN 500 MG PO TABS
1000.0000 mg | ORAL_TABLET | ORAL | Status: AC
  Administered 2017-01-25: 1000 mg via ORAL

## 2017-01-25 MED ORDER — TRAMADOL HCL 50 MG PO TABS: 50.0000 mg | ORAL_TABLET | Freq: Four times a day (QID) | ORAL | 1 refills | Status: DC | PRN

## 2017-01-25 MED ORDER — GABAPENTIN 300 MG PO CAPS: 300.0000 mg | ORAL_CAPSULE | ORAL | Status: DC

## 2017-01-25 MED ORDER — GABAPENTIN 300 MG PO CAPS
ORAL_CAPSULE | ORAL | Status: AC
  Filled 2017-01-25: qty 1

## 2017-01-25 MED ORDER — DEXAMETHASONE SODIUM PHOSPHATE 4 MG/ML IJ SOLN
INTRAMUSCULAR | Status: DC | PRN
  Administered 2017-01-25: 10 mg via INTRAVENOUS

## 2017-01-25 MED ORDER — ACETAMINOPHEN 500 MG PO TABS
ORAL_TABLET | ORAL | Status: AC
  Filled 2017-01-25: qty 2

## 2017-01-25 MED ORDER — CELECOXIB 200 MG PO CAPS: 200.0000 mg | ORAL_CAPSULE | ORAL | Status: DC

## 2017-01-25 MED ORDER — HYDROCODONE-ACETAMINOPHEN 5-325 MG PO TABS: 1.0000 | ORAL_TABLET | ORAL | 0 refills | Status: DC | PRN

## 2017-01-25 MED ORDER — BUPIVACAINE-EPINEPHRINE (PF) 0.5% -1:200000 IJ SOLN
INTRAMUSCULAR | Status: DC | PRN
  Administered 2017-01-25: 30 mL via PERINEURAL

## 2017-01-25 MED ORDER — SODIUM CHLORIDE 0.9 % IJ SOLN
INTRAMUSCULAR | Status: AC
  Filled 2017-01-25: qty 10

## 2017-01-25 MED ORDER — MIDAZOLAM HCL 2 MG/2ML IJ SOLN
1.0000 mg | INTRAMUSCULAR | Status: DC | PRN
  Administered 2017-01-25 (×2): 1 mg via INTRAVENOUS

## 2017-01-25 MED ORDER — FENTANYL CITRATE (PF) 100 MCG/2ML IJ SOLN
INTRAMUSCULAR | Status: DC | PRN
  Administered 2017-01-25: 100 ug via INTRAVENOUS

## 2017-01-25 MED ORDER — ONDANSETRON HCL 4 MG/2ML IJ SOLN
INTRAMUSCULAR | Status: AC
  Filled 2017-01-25: qty 2

## 2017-01-25 SURGICAL SUPPLY — 48 items
ADH SKN CLS APL DERMABOND .7 (GAUZE/BANDAGES/DRESSINGS) ×1
APPLIER CLIP 9.375 MED OPEN (MISCELLANEOUS) ×3
APR CLP MED 9.3 20 MLT OPN (MISCELLANEOUS) ×1
BLADE SURG 15 STRL LF DISP TIS (BLADE) ×1 IMPLANT
BLADE SURG 15 STRL SS (BLADE) ×3
CANISTER SUC SOCK COL 7IN (MISCELLANEOUS) IMPLANT
CANISTER SUCT 1200ML W/VALVE (MISCELLANEOUS) IMPLANT
CHLORAPREP W/TINT 26ML (MISCELLANEOUS) ×3 IMPLANT
CLIP APPLIE 9.375 MED OPEN (MISCELLANEOUS) ×1 IMPLANT
COVER BACK TABLE 60X90IN (DRAPES) ×3 IMPLANT
COVER MAYO STAND STRL (DRAPES) ×3 IMPLANT
COVER PROBE W GEL 5X96 (DRAPES) ×3 IMPLANT
DECANTER SPIKE VIAL GLASS SM (MISCELLANEOUS) IMPLANT
DERMABOND ADVANCED (GAUZE/BANDAGES/DRESSINGS) ×2
DERMABOND ADVANCED .7 DNX12 (GAUZE/BANDAGES/DRESSINGS) ×1 IMPLANT
DEVICE DUBIN W/COMP PLATE 8390 (MISCELLANEOUS) ×3 IMPLANT
DRAPE LAPAROSCOPIC ABDOMINAL (DRAPES) ×3 IMPLANT
DRAPE UTILITY XL STRL (DRAPES) ×3 IMPLANT
ELECT COATED BLADE 2.86 ST (ELECTRODE) ×3 IMPLANT
ELECT REM PT RETURN 9FT ADLT (ELECTROSURGICAL) ×3
ELECTRODE REM PT RTRN 9FT ADLT (ELECTROSURGICAL) ×1 IMPLANT
GLOVE BIO SURGEON STRL SZ7.5 (GLOVE) ×3 IMPLANT
GLOVE BIOGEL PI IND STRL 7.0 (GLOVE) IMPLANT
GLOVE BIOGEL PI INDICATOR 7.0 (GLOVE) ×4
GLOVE ECLIPSE 6.5 STRL STRAW (GLOVE) ×2 IMPLANT
GOWN STRL REUS W/ TWL LRG LVL3 (GOWN DISPOSABLE) ×2 IMPLANT
GOWN STRL REUS W/TWL LRG LVL3 (GOWN DISPOSABLE) ×6
ILLUMINATOR WAVEGUIDE N/F (MISCELLANEOUS) IMPLANT
KIT MARKER MARGIN INK (KITS) ×3 IMPLANT
LIGHT WAVEGUIDE WIDE FLAT (MISCELLANEOUS) ×2 IMPLANT
NDL HYPO 25X1 1.5 SAFETY (NEEDLE) ×1 IMPLANT
NDL SAFETY ECLIPSE 18X1.5 (NEEDLE) IMPLANT
NEEDLE HYPO 18GX1.5 SHARP (NEEDLE)
NEEDLE HYPO 25X1 1.5 SAFETY (NEEDLE) ×3 IMPLANT
NS IRRIG 1000ML POUR BTL (IV SOLUTION) IMPLANT
PACK BASIN DAY SURGERY FS (CUSTOM PROCEDURE TRAY) ×3 IMPLANT
PENCIL BUTTON HOLSTER BLD 10FT (ELECTRODE) ×3 IMPLANT
SLEEVE SCD COMPRESS KNEE MED (MISCELLANEOUS) ×3 IMPLANT
SPONGE LAP 18X18 X RAY DECT (DISPOSABLE) ×3 IMPLANT
SUT MON AB 4-0 PC3 18 (SUTURE) ×6 IMPLANT
SUT SILK 2 0 SH (SUTURE) IMPLANT
SUT VICRYL 3-0 CR8 SH (SUTURE) ×3 IMPLANT
SYR CONTROL 10ML LL (SYRINGE) ×3 IMPLANT
TOWEL OR 17X24 6PK STRL BLUE (TOWEL DISPOSABLE) ×3 IMPLANT
TOWEL OR NON WOVEN STRL DISP B (DISPOSABLE) ×3 IMPLANT
TUBE CONNECTING 20'X1/4 (TUBING) ×1
TUBE CONNECTING 20X1/4 (TUBING) ×1 IMPLANT
YANKAUER SUCT BULB TIP NO VENT (SUCTIONS) ×2 IMPLANT

## 2017-01-25 NOTE — H&P (Addendum)
[name]  Location: [facility] Surgery Patient #: [id number] DOB: February 18, 1941 Single / Language: [name] / Race: Refused to Report/Unreported Female   History of Present Illness  The patient is a 76 year old female who presents with breast cancer. We are asked to see the patient in consultation by Dr. Christene Slates to evaluate her for a new left breast cancer. The patient is a 77 year old white female who recently had a routine screening mammogram. At that time she was found to have a 5 mm abnormality in the upper outer left breast. 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 She denies any breast pain or discharge from the nipple. She does have a history of breast reductions. She also has a history of a benign left breast biopsy by Dr. Margot Chimes.   Past Surgical History  Appendectomy  Cataract Surgery  Bilateral. Hip Surgery  Bilateral. Hysterectomy (not due to cancer) - Partial  Mammoplasty; Reduction  Bilateral. Oral Surgery  Tonsillectomy   Diagnostic Studies History  Colonoscopy  5-10 years ago Mammogram  within last year  Allergies  Amoxicillin-Pot Clavulanate *CHEMICALS*  DULoxetine HCl *ANTIDEPRESSANTS*  Ezetimibe-Simvastatin *ANTIHYPERLIPIDEMICS*  Oxycodone-Acetaminophen *ANALGESICS - OPIOID*  Phentermine HCl *CHEMICALS*   Medication History  Hydrocodone-Acetaminophen (5-325MG Tablet, Oral) Active. DiazePAM (5MG Tablet, Oral) Active. Clobetasol Propionate (0.05% Cream, External) Active. Vitamin D (2000UNIT Tablet, Oral) Active. Vitamin E (1000UNIT Tablet, Oral) Active. Biotin (1MG Capsule, Oral) Active. Cyanocobalamin (1000MCG Capsule, Oral) Active. Tylenol 8 Hour (650MG Tablet ER, Oral) Active. Medications Reconciled  Social History  Caffeine use  Carbonated beverages. No drug use   Family History  Alcohol Abuse  Father. Cancer   Brother. Heart Disease  Father.  Pregnancy / Birth History  Age at menarche  82 years. Contraceptive History  Intrauterine device. Gravida  1 Para  1  Other Problems  Gastroesophageal Reflux Disease  Hemorrhoids     Review of Systems  General Not Present- Appetite Loss, Chills, Fatigue, Fever, Night Sweats, Weight Gain and Weight Loss. Skin Not Present- Change in Wart/Mole, Dryness, Hives, Jaundice, New Lesions, Non-Healing Wounds, Rash and Ulcer. HEENT Present- Seasonal Allergies. Not Present- Earache, Hearing Loss, Hoarseness, Nose Bleed, Oral Ulcers, Ringing in the Ears, Sinus Pain, Sore Throat, Visual Disturbances, Wears glasses/contact lenses and Yellow Eyes. Respiratory Not Present- Bloody sputum, Chronic Cough, Difficulty Breathing, Snoring and Wheezing. Breast Not Present- Breast Mass, Breast Pain, Nipple Discharge and Skin Changes. Cardiovascular Not Present- Chest Pain, Difficulty Breathing Lying Down, Leg Cramps, Palpitations, Rapid Heart Rate, Shortness of Breath and Swelling of Extremities. Gastrointestinal Not Present- Abdominal Pain, Bloating, Bloody Stool, Change in Bowel Habits, Chronic diarrhea, Constipation, Difficulty Swallowing, Excessive gas, Gets full quickly at meals, Hemorrhoids, Indigestion, Nausea, Rectal Pain and Vomiting. Female Genitourinary Not Present- Frequency, Nocturia, Painful Urination, Pelvic Pain and Urgency. Musculoskeletal Present- Back Pain. Not Present- Joint Pain, Joint Stiffness, Muscle Pain, Muscle Weakness and Swelling of Extremities. Neurological Not Present- Decreased Memory, Fainting, Headaches, Numbness, Seizures, Tingling, Tremor, Trouble walking and Weakness. Psychiatric Not Present- Anxiety, Bipolar, Change in Sleep Pattern, Depression, Fearful and Frequent crying. Endocrine Not Present- Cold Intolerance, Excessive Hunger, Hair Changes, Heat Intolerance, Hot flashes and New Diabetes. Hematology Not Present- Easy Bruising,  Excessive bleeding, Gland problems, HIV and Persistent Infections.  Vitals  Weight: 194.6 lb Height: 64in Body Surface Area: 1.93 m Body Mass Index: 33.4 kg/m  Temp.: 98.43F  Pulse: 122 (Regular)  BP: 142/80 (  Sitting, Left Arm, Standard)       Physical Exam  General Mental Status-Alert. General Appearance-Consistent with stated age. Hydration-Well hydrated. Voice-Normal.  Head and Neck Head-normocephalic, atraumatic with no lesions or palpable masses. Trachea-midline. Thyroid Gland Characteristics - normal size and consistency.  Eye Eyeball - Bilateral-Extraocular movements intact. Sclera/Conjunctiva - Bilateral-No scleral icterus.  Chest and Lung Exam Chest and lung exam reveals -quiet, even and easy respiratory effort with no use of accessory muscles and on auscultation, normal breath sounds, no adventitious sounds and normal vocal resonance. Inspection Chest Wall - Normal. Back - normal.  Breast Note: There is no palpable mass in either breast. There is no palpable axillary, supraclavicular, or cervical lymphadenopathy.   Cardiovascular Cardiovascular examination reveals -normal heart sounds, regular rate and rhythm with no murmurs and normal pedal pulses bilaterally.  Abdomen Inspection Inspection of the abdomen reveals - No Hernias. Skin - Scar - no surgical scars. Palpation/Percussion Palpation and Percussion of the abdomen reveal - Soft, Non Tender, No Rebound tenderness, No Rigidity (guarding) and No hepatosplenomegaly. Auscultation Auscultation of the abdomen reveals - Bowel sounds normal.  Neurologic Neurologic evaluation reveals -alert and oriented x 3 with no impairment of recent or remote memory. Mental Status-Normal.  Musculoskeletal Normal Exam - Left-Upper Extremity Strength Normal and Lower Extremity Strength Normal. Normal Exam - Right-Upper Extremity Strength Normal and Lower Extremity Strength  Normal.  Lymphatic Head & Neck  General Head & Neck Lymphatics: Bilateral - Description - Normal. Axillary  General Axillary Region: Bilateral - Description - Normal. Tenderness - Non Tender. Femoral & Inguinal  Generalized Femoral & Inguinal Lymphatics: Bilateral - Description - Normal. Tenderness - Non Tender.     MALIGNANT NEOPLASM OF UPPER-OUTER QUADRANT OF LEFT BREAST IN FEMALE, ESTROGEN RECEPTOR POSITIVE (C50.412) Impression: The patient appears to have a small stage I cancer in the upper outer left breast. I have talked to her in detail about the different options for treatment and at this point she favors breast conservation. I think this is a very reasonable way of treating her cancer. She is also a good candidate for sentinel node mapping. I will plan for a left breast radioactive seed localized lumpectomy and sentinel node mapping. I have discussed with her in detail the risks and benefits of the operation as well as some of the technical aspects and she understands and wishes to proceed. I will also refer her to medical and radiation oncology to discuss adjuvant therapy. Current Plans Referred to Oncology, for evaluation and follow up (Oncology). Routine.

## 2017-01-25 NOTE — Op Note (Signed)
01/25/2017  4:16 PM  PATIENT:  [name]  76 y.o. female  PRE-OPERATIVE DIAGNOSIS:  LEFT BREAST CANCER  POST-OPERATIVE DIAGNOSIS:  LEFT BREAST CANCER  PROCEDURE:  Procedure(s): LEFT BREAST LUMPECTOMY WITH RADIOACTIVE SEED AND DEEP LEFT AXILLARY SENTINEL LYMPH NODE BIOPSY (Left)  SURGEON:  Surgeon(s) and Role:    * Jovita Kussmaul, MD - Primary  PHYSICIAN ASSISTANT:   ASSISTANTS: none   ANESTHESIA:   local and general  EBL:  Total I/O In: 1700 [I.V.:1700] Out: 10 [Blood:10]  BLOOD ADMINISTERED:none  DRAINS: none   LOCAL MEDICATIONS USED:  MARCAINE     SPECIMEN:  Source of Specimen:  left breast tissue and 2 deep left axillary nodes  DISPOSITION OF SPECIMEN:  PATHOLOGY  COUNTS:  YES  TOURNIQUET:  * No tourniquets in log *  DICTATION: .Dragon Dictation   After informed consent was obtained the patient was brought to the operating room and placed in the supine position on the operating table. After adequate induction of general anesthesia the patient's left chest, breast, and axillary area were prepped with ChloraPrep, allowed to dry, and draped in usual sterile manner. An appropriate timeout was performed. Earlier in the day the patient underwent injection of 1 mCi of technetium sulfur colloid in the subareolar position on the left. Previously an I-125 seed was placed in the upper outer left breast to mark an area of invasive breast cancer. The neoprobe was set to technetium and no appreciable activity was identified in the left axilla. Because of this 2 mL of methylene blue and 3 mL of injectable saline were injected in the subareolar area of the left breast and the breast was massaged for several minutes. Next the neoprobe was set to I-125 in the area of the radioactive seed was readily identified in the upper outer left breast. The area of radioactivity was very superficial and because of this an elliptical incision was made in the skin overlying the area of the  radioactivity with a 15 blade knife. The incision was carried through the skin and subcutaneous tissue sharply with the electrocautery. Next a circular portion of breast tissue was excised sharply around the radioactive seed while checking the area of radioactivity frequently with the neoprobe. Once the specimen was removed it was oriented with the appropriate paint colors. A specimen radiograph was obtained that showed the clip and seed to be in the specimen. An additional superior margin was taken sharply with electrocautery and marked appropriately. The specimens were then sent to pathology for further evaluation. The neoprobe was then set to technetium again and again no appreciable activity was identified in the left axilla. The dissection was very close to the axilla so the dissection was extended superiorly into the deep left axillary space. Blunt hemostat and finger dissection were carried out until 2 palpable lymph nodes were identified. These lymph nodes were excised sharply with the electrocautery and the lymphatics were controlled with clips. These were sent as left deep axillary nodes numbers 1 and 2. No other hot, blue, or palpable lymph nodes were identified in the left axilla. At this point the deep left axillary space was closed with interrupted 3-0 Vicryl stitches. The lumpectomy cavity was marked with clips and infiltrated with quarter percent Marcaine. The area was irrigated with saline. The deep layer of this wound was then closed with layers of interrupted 3-0 Vicryl stitches. The skin was then closed with a running 4-0 Monocryl subcuticular stitch. Dermabond dressings were applied. The patient tolerated the  procedure well. At the end of the case all needle sponge and instrument counts were correct. The patient was then awakened and taken to recovery in stable condition.  PLAN OF CARE: Discharge to home after PACU  PATIENT DISPOSITION:  PACU - hemodynamically stable.   Delay start of  Pharmacological VTE agent (>24hrs) due to surgical blood loss or risk of bleeding: not applicable

## 2017-01-25 NOTE — Anesthesia Procedure Notes (Signed)
Procedure Name: LMA Insertion Date/Time: 01/25/2017 3:08 PM Performed by: Toula Moos L Pre-anesthesia Checklist: Patient identified, Emergency Drugs available, Suction available, Patient being monitored and Timeout performed Patient Re-evaluated:Patient Re-evaluated prior to inductionOxygen Delivery Method: Circle system utilized Preoxygenation: Pre-oxygenation with 100% oxygen Intubation Type: IV induction Ventilation: Mask ventilation without difficulty LMA: LMA inserted LMA Size: 4.0 Number of attempts: 1 Airway Equipment and Method: Bite block Placement Confirmation: positive ETCO2 Tube secured with: Tape Dental Injury: Teeth and Oropharynx as per pre-operative assessment

## 2017-01-25 NOTE — Anesthesia Preprocedure Evaluation (Addendum)
Anesthesia Evaluation  Patient identified by MRN, date of birth, ID band Patient awake    Reviewed: Allergy & Precautions, NPO status , Patient's Chart, lab work & pertinent test results  History of Anesthesia Complications (+) PONV and history of anesthetic complications  Airway Mallampati: II  TM Distance: >3 FB Neck ROM: Limited    Dental  (+) Teeth Intact, Dental Advisory Given   Pulmonary former smoker,    Pulmonary exam normal breath sounds clear to auscultation       Cardiovascular Exercise Tolerance: Good Normal cardiovascular exam Rhythm:Regular Rate:Normal  HLD   Neuro/Psych PSYCHIATRIC DISORDERS Anxiety S/p ACDF  Neuromuscular disease (Left peroneal neuropathy)    GI/Hepatic negative GI ROS, Neg liver ROS,   Endo/Other  Obesity   Renal/GU negative Renal ROS     Musculoskeletal  (+) Arthritis , Osteoarthritis,    Abdominal   Peds  Hematology  (+) Blood dyscrasia, anemia ,   Anesthesia Other Findings Day of surgery medications reviewed with the patient.  Reproductive/Obstetrics                            Anesthesia Physical Anesthesia Plan  ASA: III  Anesthesia Plan: General   Post-op Pain Management:  Regional for Post-op pain   Induction: Intravenous  PONV Risk Score and Plan: 4 or greater and Ondansetron, Dexamethasone, Propofol and Treatment may vary due to age or medical condition  Airway Management Planned: LMA  Additional Equipment:   Intra-op Plan:   Post-operative Plan: Extubation in OR  Informed Consent: I have reviewed the patients History and Physical, chart, labs and discussed the procedure including the risks, benefits and alternatives for the proposed anesthesia with the patient or authorized representative who has indicated his/her understanding and acceptance.   Dental advisory given  Plan Discussed with: CRNA  Anesthesia Plan Comments:  (Risks/benefits of general anesthesia discussed with patient including risk of damage to teeth, lips, gum, and tongue, nausea/vomiting, allergic reactions to medications, and the possibility of heart attack, stroke and death.  All patient questions answered.  Patient wishes to proceed.)        Anesthesia Quick Evaluation

## 2017-01-25 NOTE — Anesthesia Procedure Notes (Signed)
Anesthesia Regional Block: Pectoralis block   Pre-Anesthetic Checklist: ,, timeout performed, Correct Patient, Correct Site, Correct Laterality, Correct Procedure, Correct Position, site marked, Risks and benefits discussed,  Surgical consent,  Pre-op evaluation,  At surgeon's request and post-op pain management  Laterality: Left  Prep: chloraprep       Needles:  Injection technique: Single-shot  Needle Type: Echogenic Needle     Needle Length: 9cm  Needle Gauge: 21     Additional Needles:   Procedures: ultrasound guided,,,,,,,,  Narrative:  Start time: 01/25/2017 2:00 PM End time: 01/25/2017 2:05 PM Injection made incrementally with aspirations every 5 mL.  Performed by: Personally  Anesthesiologist: Catalina Gravel  Additional Notes: No pain on injection. No increased resistance to injection. Injection made in 5cc increments.  Good needle visualization.  Patient tolerated procedure well.

## 2017-01-25 NOTE — Progress Notes (Signed)
Assisted Dr. Turk with left, ultrasound guided, pectoralis block. Side rails up, monitors on throughout procedure. See vital signs in flow sheet. Tolerated Procedure well. 

## 2017-01-25 NOTE — Transfer of Care (Signed)
Immediate Anesthesia Transfer of Care Note  Patient: [name]  Procedure(s) Performed: Procedure(s): LEFT BREAST LUMPECTOMY WITH RADIOACTIVE SEED AND SENTINEL LYMPH NODE BIOPSY (Left)  Patient Location: PACU  Anesthesia Type:GA combined with regional for post-op pain  Level of Consciousness: sedated  Airway & Oxygen Therapy: Patient Spontanous Breathing and Patient connected to face mask oxygen  Post-op Assessment: Report given to RN and Post -op Vital signs reviewed and stable  Post vital signs: Reviewed and stable  Last Vitals:  Vitals:   01/25/17 1412 01/25/17 1413  BP:    Pulse: 94 90  Resp: 17 19  Temp:      Last Pain:  Vitals:   01/25/17 1312  TempSrc: Oral      Patients Stated Pain Goal: 0 ([id number])  Complications: No apparent anesthesia complications

## 2017-01-25 NOTE — Discharge Instructions (Signed)

## 2017-01-25 NOTE — Interval H&P Note (Signed)
History and Physical Interval Note:  01/25/2017 2:39 PM  [name]  has presented today for surgery, with the diagnosis of L BREAST CANCER  The various methods of treatment have been discussed with the patient and family. After consideration of risks, benefits and other options for treatment, the patient has consented to  Procedure(s): LEFT BREAST LUMPECTOMY WITH RADIOACTIVE SEED AND DEEP LEFT AXILLARY SENTINEL LYMPH NODE BIOPSY (Left) as a surgical intervention .  The patient's history has been reviewed, patient examined, no change in status, stable for surgery.  I have reviewed the patient's chart and labs.  Questions were answered to the patient's satisfaction.     TOTH III,PAUL S

## 2017-01-25 NOTE — Anesthesia Postprocedure Evaluation (Signed)
Anesthesia Post Note  Patient: HIBBA SCHRAM  Procedure(s) Performed: Procedure(s) (LRB): LEFT BREAST LUMPECTOMY WITH RADIOACTIVE SEED AND SENTINEL LYMPH NODE BIOPSY (Left)     Patient location during evaluation: PACU Anesthesia Type: General Level of consciousness: awake and alert Pain management: pain level controlled Vital Signs Assessment: post-procedure vital signs reviewed and stable Respiratory status: spontaneous breathing, nonlabored ventilation, respiratory function stable and patient connected to nasal cannula oxygen Cardiovascular status: blood pressure returned to baseline and stable Postop Assessment: no signs of nausea or vomiting Anesthetic complications: no    Last Vitals:  Vitals:   01/25/17 1628 01/25/17 1736  BP: (!) 143/63 (!) 155/72  Pulse:  (!) 103  Resp:  18  Temp: 36.5 C 36.4 C    Last Pain:  Vitals:   01/25/17 1736  TempSrc: Oral  PainSc: 4                  Catalina Gravel

## 2017-01-25 NOTE — Progress Notes (Signed)
Emotional support during breast injections °

## 2017-01-27 ENCOUNTER — Encounter (HOSPITAL_BASED_OUTPATIENT_CLINIC_OR_DEPARTMENT_OTHER): Payer: Self-pay | Admitting: General Surgery

## 2017-02-03 ENCOUNTER — Ambulatory Visit: Payer: Medicare Other | Admitting: Oncology

## 2017-02-09 ENCOUNTER — Encounter (HOSPITAL_COMMUNITY): Payer: Medicare Other

## 2017-02-15 ENCOUNTER — Telehealth: Payer: Self-pay

## 2017-02-15 ENCOUNTER — Ambulatory Visit (HOSPITAL_BASED_OUTPATIENT_CLINIC_OR_DEPARTMENT_OTHER): Payer: Medicare Other | Admitting: Oncology

## 2017-02-15 VITALS — BP 153/75 | HR 94 | Temp 98.2°F | Resp 18 | Ht 64.0 in | Wt 193.9 lb

## 2017-02-15 DIAGNOSIS — Z170 Estrogen receptor positive status [ER+]: Secondary | ICD-10-CM

## 2017-02-15 DIAGNOSIS — M8580 Other specified disorders of bone density and structure, unspecified site: Secondary | ICD-10-CM

## 2017-02-15 DIAGNOSIS — C50412 Malignant neoplasm of upper-outer quadrant of left female breast: Secondary | ICD-10-CM | POA: Diagnosis present

## 2017-02-15 MED ORDER — ANASTROZOLE 1 MG PO TABS: 1.0000 mg | ORAL_TABLET | Freq: Every day | ORAL | 3 refills | Status: DC

## 2017-02-15 NOTE — Telephone Encounter (Signed)
spoke with patient and she is aware of all new appts  [name]

## 2017-02-15 NOTE — Progress Notes (Signed)
[place]  [phone] [phone]     ID: JUSTIN BUECHNER DOB: 1941/07/08  MR#: [id number]  MBW#:[id number]  Patient Care Team: Cassandria Anger, MD as PCP - General Terrance Mass, MD (Obstetrics and Gynecology) Kristeen Miss, MD as Attending Physician (Neurosurgery) Jovita Kussmaul, MD as Consulting Physician (General Surgery) Rolm Bookbinder, MD as Consulting Physician (Dermatology) Bensimhon, Shaune Pascal, MD as Consulting Physician (Cardiology) Chauncey Cruel, MD OTHER MD:  CHIEF COMPLAINT: Triple positive breast cancer  CURRENT TREATMENT: Anastrozole, trastuzumab  BREAST CANCER HISTORY: From the original intake note:  The patient had bilateral screening mammography at [facility] 06/13/2016 showing a 5 mm mass in the left breast upper outer quadrant middle depth. This was felt to be most likely fat necrosis but additional imaging with ultrasonography was obtained the same day and confirmed a 0 point centimeter round oil cyst in the left breast at the 1:00 anterior depth. This was felt to be most likely benign but six--month follow-up diagnostic mammography of the left breast with tomography was recommended and performed 12/20/2016. The breast density was category A.. The oval mass in the left breast upper outer quadrant had increased in size.  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  The patient has met with my partner Dr. Burr Medico and is here today as a second opinion regarding how to proceed   INTERVAL HISTORY: Christabella returns today for follow-up of her triple positive breast cancer accompanied by her friend Manus Gunning. Since her last visit here Remo Lipps underwent left lumpectomy and  sentinel lymph node sampling. The date was 01/25/2017. 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 The single sentinel lymph node was clear. Margins were negative.  These results were discussed in the multidisciplinary breast cancer conference today, 02/15/2017. The consensus was that the patient would benefit from trastuzumab in addition to anti-estrogens and that adjuvant radiation would not be necessary if she agreed to those systemic treatments.  REVIEW OF SYSTEMS: Orlando Penner has recovered nicely from her surgery. She has some sensitivity in the breast and a little bit of soreness. Particularly at night when she turns over she can be somewhat uncomfortable. Occasionally she has "shooting pains". She has no left upper extremity lymphedema and has a pretty good range of motion in the left upper extremity. A detailed review of systems today was otherwise stable   PAST MEDICAL HISTORY: Past Medical History:  Diagnosis Date  . Anxiety   . Degenerative disk disease   . Diverticulosis   . Hyperlipemia   . LBP (low back pain)   . Osteoarthritis   . PONV (postoperative nausea and vomiting)   . Vitamin B deficiency   . Vitamin D deficiency     PAST SURGICAL HISTORY: Past Surgical History:  Procedure Laterality Date  . ABDOMINAL HYSTERECTOMY  2005   SUPRACERVICAL HYSTERECTOMY  . APPENDECTOMY    . BREAST BIOPSY Left 06/03/2013   Procedure: BREAST BIOPSY WITH NEEDLE LOCALIZATION;  Surgeon: [name], MD;  Location: [place];  Service: General;  Laterality: Lef also lumpectomy;  . BREAST BIOPSY Left 12/21/2016  . BREAST LUMPECTOMY WITH RADIOACTIVE SEED AND SENTINEL LYMPH NODE BIOPSY Left 01/25/2017   Procedure: LEFT BREAST LUMPECTOMY  WITH RADIOACTIVE SEED AND SENTINEL LYMPH NODE BIOPSY;  Surgeon: Jovita Kussmaul, MD;  Location: [place];  Service: General;  Laterality: Left;  . BREAST SURGERY  2000   breast reduction...  BREAST BIOPSY ON OCTOBER 2014  . cataract surgery Bilateral   . CERVICAL FUSION  1999 & 2009   C3-4 [place]  . EYE SURGERY Left 2012   cataract  . JOINT REPLACEMENT    . SPINE SURGERY  1999&2009  . TONSILLECTOMY    . TOTAL HIP ARTHROPLASTY  (L) 2007 (R) 2011   [place]    FAMILY HISTORY Family History  Problem Relation Age of Onset  . Arthritis Mother 72       polymyositis  . Hypertension Father   . Heart disease Father   . Hypertension Brother   . Cancer Brother        Oral  She does not meet criteria for genetics testing   GYNECOLOGIC HISTORY:  No LMP recorded. Patient has had a hysterectomy. Menarche age 33, first live birth age 44, she is [place] P1. She stopped having periods sometime in her early 68s. She did not use hormone replacement but did use some vaginal cream at times after menopause. 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 Her first marriage lasted only 2 years. She had no children from that marriage. Her second husband died from an aneurysm or than 30 years ago. That second marriage produced her son Percell Miller, 32 years old as of June 2018. He works for a [employer]. He is recently separated from his wife in [place] and is living with the patient temporarily. The patient does have 2 grandchildren. She attends [employer]    ADVANCED DIRECTIVES: Not in place. At the 01/11/2017 visit the patient tells me she intends to name her son as her healthcare part of attorney   HEALTH MAINTENANCE: Social History  Substance Use Topics  . Smoking status: Former Smoker    Packs/day: 3.00    Years: 20.00    Quit date: 04/19/1985  . Smokeless tobacco: Never Used  . Alcohol use Yes     Comment: occasionally      Colonoscopy:November 2008  PAP:  Bone density: 07/16/2015 showed osteopenia   Allergies  Allergen Reactions  . Augmentin [Amoxicillin-Pot  Clavulanate] Diarrhea    diarrhea  . Cymbalta [Duloxetine Hcl] Diarrhea    diarrhea  . Ezetimibe-Simvastatin Other (See Comments)  . Phentermine Hcl Other (See Comments)    Current Outpatient Prescriptions  Medication Sig Dispense Refill  . Acetaminophen (TYLENOL ARTHRITIS PAIN PO) Take by mouth.    Marland Kitchen anastrozole (ARIMIDEX) 1 MG tablet Take 1 tablet (1 mg total) by mouth daily. 90 tablet 3  . Ascorbic Acid (VITAMIN C PO) Take by mouth daily.    . Biotin 1000 MCG tablet Take 2,000 mcg by mouth 3 (three) times daily.    . Cholecalciferol (VITAMIN D3) 1000 UNITS tablet Take 2,000 Units by mouth daily.      . diazepam (VALIUM) 5 MG tablet Take 1 tablet (5 mg total) by mouth 2 (two) times daily as needed for anxiety or muscle spasms. 60 tablet 2  . traMADol (ULTRAM) 50 MG tablet Take 1-2 tablets (50-100 mg total) by mouth every 6 (six) hours as needed. 30 tablet 1  . vitamin B-12 (CYANOCOBALAMIN) 1000 MCG tablet Take 1,000 mcg  by mouth daily.      . vitamin E 1000 UNIT capsule Take 1,000 Units by mouth daily.     No current facility-administered medications for this visit.     OBJECTIVE: Older white woman Who appears well  Vitals:   02/15/17 1122  BP: (!) 153/75  Pulse: 94  Resp: 18  Temp: 98.2 F (36.8 C)     Body mass index is 33.28 kg/m.    ECOG FS:1 - Symptomatic but completely ambulatory  Sclerae unicteric, EOMs intact Oropharynx clear and moist No cervical or supraclavicular adenopathy Lungs no rales or rhonchi Heart regular rate and rhythm Abd soft, nontender, positive bowel sounds MSK no focal spinal tenderness, no upper extremity lymphedema Neuro: nonfocal, well oriented, appropriate affect Breasts: The right breast is unremarkable. The left breast is status post recent lumpectomy. The incision is healing nicely, with no erythema, swelling, or dehiscence. Both axillae are benign.  LAB RESULTS:  CMP     Component Value Date/Time   NA 138 10/20/2014 1405   K 4.2  10/20/2014 1405   CL 105 10/20/2014 1405   CO2 27 10/20/2014 1405   GLUCOSE 94 10/20/2014 1405   GLUCOSE 99 05/18/2006 1500   BUN 18 10/20/2014 1405   CREATININE 0.68 10/20/2014 1405   CALCIUM 9.3 10/20/2014 1405   CALCIUM 9.3 06/17/2011 1000   PROT 7.3 10/20/2014 1405   ALBUMIN 4.1 10/20/2014 1405   AST 17 10/20/2014 1405   ALT 18 10/20/2014 1405   ALKPHOS 75 10/20/2014 1405   BILITOT 0.4 10/20/2014 1405   GFRNONAA 86 (L) 05/24/2013 1304   GFRAA >90 05/24/2013 1304    No results found for: TOTALPROTELP, ALBUMINELP, A1GS, A2GS, BETS, BETA2SER, GAMS, MSPIKE, SPEI  No results found for: Nils Pyle, [facility]  Lab Results  Component Value Date   WBC 8.1 10/20/2014   NEUTROABS 4.9 10/20/2014   HGB 14.9 10/20/2014   HCT 43.7 10/20/2014   MCV 94.4 10/20/2014   PLT 310.0 10/20/2014      Chemistry      Component Value Date/Time   NA 138 10/20/2014 1405   K 4.2 10/20/2014 1405   CL 105 10/20/2014 1405   CO2 27 10/20/2014 1405   BUN 18 10/20/2014 1405   CREATININE 0.68 10/20/2014 1405      Component Value Date/Time   CALCIUM 9.3 10/20/2014 1405   CALCIUM 9.3 06/17/2011 1000   ALKPHOS 75 10/20/2014 1405   AST 17 10/20/2014 1405   ALT 18 10/20/2014 1405   BILITOT 0.4 10/20/2014 1405       No results found for: LABCA2  No components found for: EHMCNO709  No results for input(s): INR in the last 168 hours.  Urinalysis    Component Value Date/Time   COLORURINE YELLOW 10/20/2014 1405   APPEARANCEUR CLEAR 10/20/2014 1405   LABSPEC >=1.030 (A) 10/20/2014 1405   PHURINE 5.5 10/20/2014 1405   GLUCOSEU NEGATIVE 10/20/2014 1405   HGBUR NEGATIVE 10/20/2014 1405   BILIRUBINUR SMALL (A) 10/20/2014 1405   KETONESUR TRACE (A) 10/20/2014 1405   PROTEINUR NEGATIVE 08/24/2009 0900   UROBILINOGEN 0.2 10/20/2014 1405   NITRITE NEGATIVE 10/20/2014 1405   LEUKOCYTESUR NEGATIVE 10/20/2014 1405     STUDIES: No results found.  ELIGIBLE FOR AVAILABLE  RESEARCH PROTOCOL: No  ASSESSMENT: 76 y.o. [place] woman status post left breast upper outer quadrant biopsy 12/21/2016 for a invasive lobular carcinoma, grade 1, estrogen and progesterone receptor positive, with an MIB-1 of 15%, and HER-2 amplified (triple positive).  (  1) left lumpectomy and sentinel lymph node sampling 01/25/2017 showed a pT1b pN0, stage IA invasive lobular breast cancer, grade 2, with negative margins  (2) anti-HER-2 immunotherapy to consist of trastuzumab for 6 months starting 03/01/2017  (3) adjuvant radiation omitted as per the multidisciplinary clinic discussion 02/15/2017   (4) anastrozole started 02/15/2017  PLAN: I spent approximately 40 minutes today with Remo Lipps and her friend going over [place] situation. From a local treatment point of view she has had excellent surgery. The standard of care is to follow that up with radiation to lower the risk of local recurrence. However we do have data from 2 studies showing that in women over 70 with node-negative not grade 3 tumors radiation can be omitted if the patient takes systemic therapy without compromising survival. Accordingly at the conference today it was felt no radiation would be needed in her case.  We then discussed systemic therapy. She qualifies for all 3 namely anti-estrogens anti-HER-2 immunotherapy, and chemotherapy. I would prefer not to give her chemotherapy, since the benefit in her case is likely to be marginal to mail. Instead even though this is "out-of-the-box", I would give her anti-HER-2 treatment as well as anti-estrogen treatment. The rationale of course is that there is cross talk between the HER-2 and estrogen receptors and blocking the estrogen receptor alone frequently is ineffective in HER-2 positive patients who is HER-2 receptor has not been inactivated.  My proposal to her was that she started anastrozole now. We went over the possible toxicities, side effects and complications of this agent.  She will call me she has any problems with it and I went ahead and placed the prescription.  We would then start trastuzumab 03/01/2017. She will have an echocardiogram prior to that date. The plan will be for a total of 6 months, based on the persephone study, although of course all the patients in that study did receive chemotherapy together with the anti-HER-2 immunotherapy.  I think she would benefit from a sports bra and I wrote her that prescription. I am also referring her to physical therapy both to learn how to do length massage and to discuss the pelvic health program.  I will see her with her second dose of trastuzumab to make sure she is tolerating this well. She knows to call for any problems that may develop before that visit.  Chauncey Cruel, MD   02/15/2017 12:37 PM Medical Oncology and Hematology [facility] [address] [place], [place] [address] Tel. [phone]    Fax. [phone]

## 2017-02-17 ENCOUNTER — Telehealth: Payer: Self-pay | Admitting: Oncology

## 2017-02-17 NOTE — Telephone Encounter (Signed)
Scheduled chemo edu class per 7/19 sch message - patient is aware.

## 2017-02-21 ENCOUNTER — Ambulatory Visit (HOSPITAL_COMMUNITY)
Admission: RE | Admit: 2017-02-21 | Discharge: 2017-02-21 | Disposition: A | Discharge status: Home / Self Care | Payer: Medicare Other | Source: Ambulatory Visit | Attending: Oncology | Admitting: Oncology

## 2017-02-21 DIAGNOSIS — I061 Rheumatic aortic insufficiency: Secondary | ICD-10-CM | POA: Diagnosis not present

## 2017-02-21 DIAGNOSIS — I5030 Unspecified diastolic (congestive) heart failure: Secondary | ICD-10-CM | POA: Insufficient documentation

## 2017-02-21 DIAGNOSIS — Z170 Estrogen receptor positive status [ER+]: Secondary | ICD-10-CM | POA: Diagnosis not present

## 2017-02-21 DIAGNOSIS — I422 Other hypertrophic cardiomyopathy: Secondary | ICD-10-CM | POA: Diagnosis not present

## 2017-02-21 DIAGNOSIS — C50412 Malignant neoplasm of upper-outer quadrant of left female breast: Secondary | ICD-10-CM

## 2017-02-21 NOTE — Progress Notes (Signed)
  Echocardiogram 2D Echocardiogram has been performed.  Bedford Winsor L Androw 02/21/2017, 11:49 AM

## 2017-02-22 ENCOUNTER — Encounter: Payer: Self-pay | Admitting: *Deleted

## 2017-02-24 ENCOUNTER — Encounter (HOSPITAL_COMMUNITY): Payer: Medicare Other

## 2017-02-24 ENCOUNTER — Other Ambulatory Visit: Payer: Medicare Other

## 2017-02-28 ENCOUNTER — Other Ambulatory Visit: Payer: Self-pay | Admitting: *Deleted

## 2017-02-28 ENCOUNTER — Other Ambulatory Visit: Payer: Self-pay | Admitting: Emergency Medicine

## 2017-02-28 DIAGNOSIS — C50412 Malignant neoplasm of upper-outer quadrant of left female breast: Secondary | ICD-10-CM

## 2017-02-28 DIAGNOSIS — Z170 Estrogen receptor positive status [ER+]: Principal | ICD-10-CM

## 2017-03-01 ENCOUNTER — Ambulatory Visit (HOSPITAL_BASED_OUTPATIENT_CLINIC_OR_DEPARTMENT_OTHER): Payer: Medicare Other

## 2017-03-01 ENCOUNTER — Other Ambulatory Visit (HOSPITAL_BASED_OUTPATIENT_CLINIC_OR_DEPARTMENT_OTHER): Payer: Medicare Other

## 2017-03-01 VITALS — BP 148/80 | HR 94 | Temp 98.0°F | Resp 17

## 2017-03-01 DIAGNOSIS — C50412 Malignant neoplasm of upper-outer quadrant of left female breast: Secondary | ICD-10-CM | POA: Diagnosis present

## 2017-03-01 DIAGNOSIS — Z170 Estrogen receptor positive status [ER+]: Principal | ICD-10-CM

## 2017-03-01 DIAGNOSIS — Z5112 Encounter for antineoplastic immunotherapy: Secondary | ICD-10-CM | POA: Diagnosis present

## 2017-03-01 LAB — CBC WITH DIFFERENTIAL/PLATELET
BASO%: 0.3 % (ref 0.0–2.0)
Basophils Absolute: 0 10*3/uL (ref 0.0–0.1)
EOS%: 0.8 % (ref 0.0–7.0)
Eosinophils Absolute: 0.1 10*3/uL (ref 0.0–0.5)
HCT: 44.2 % (ref 34.8–46.6)
HGB: 14.7 g/dL (ref 11.6–15.9)
LYMPH%: 26.8 % (ref 14.0–49.7)
MCH: 32.3 pg (ref 25.1–34.0)
MCHC: 33.3 g/dL (ref 31.5–36.0)
MCV: 97.1 fL (ref 79.5–101.0)
MONO#: 0.7 10*3/uL (ref 0.1–0.9)
MONO%: 11.9 % (ref 0.0–14.0)
NEUT#: 3.7 10*3/uL (ref 1.5–6.5)
NEUT%: 60.2 % (ref 38.4–76.8)
Platelets: 255 10*3/uL (ref 145–400)
RBC: 4.55 10*6/uL (ref 3.70–5.45)
RDW: 13.3 % (ref 11.2–14.5)
WBC: 6.2 10*3/uL (ref 3.9–10.3)
lymph#: 1.7 10*3/uL (ref 0.9–3.3)

## 2017-03-01 LAB — COMPREHENSIVE METABOLIC PANEL
ALT: 23 U/L (ref 0–55)
AST: 18 U/L (ref 5–34)
Albumin: 3.8 g/dL (ref 3.5–5.0)
Alkaline Phosphatase: 84 U/L (ref 40–150)
Anion Gap: 10 mEq/L (ref 3–11)
BUN: 15.5 mg/dL (ref 7.0–26.0)
CO2: 23 mEq/L (ref 22–29)
Calcium: 9.9 mg/dL (ref 8.4–10.4)
Chloride: 105 mEq/L (ref 98–109)
Creatinine: 0.8 mg/dL (ref 0.6–1.1)
EGFR: 77 mL/min/{1.73_m2} — ABNORMAL LOW (ref >90)
Glucose: 111 mg/dl (ref 70–140)
Potassium: 3.9 mEq/L (ref 3.5–5.1)
Sodium: 138 mEq/L (ref 136–145)
Total Bilirubin: 0.44 mg/dL (ref 0.20–1.20)
Total Protein: 7.4 g/dL (ref 6.4–8.3)

## 2017-03-01 MED ORDER — ACETAMINOPHEN 325 MG PO TABS
ORAL_TABLET | ORAL | Status: AC
  Filled 2017-03-01: qty 2

## 2017-03-01 MED ORDER — TRASTUZUMAB CHEMO 150 MG IV SOLR
8.0000 mg/kg | Freq: Once | INTRAVENOUS | Status: AC
  Administered 2017-03-01: 714 mg via INTRAVENOUS
  Filled 2017-03-01: qty 34

## 2017-03-01 MED ORDER — DIPHENHYDRAMINE HCL 25 MG PO CAPS
25.0000 mg | ORAL_CAPSULE | Freq: Once | ORAL | Status: AC
  Administered 2017-03-01: 25 mg via ORAL

## 2017-03-01 MED ORDER — DIPHENHYDRAMINE HCL 25 MG PO CAPS
ORAL_CAPSULE | ORAL | Status: AC
  Filled 2017-03-01: qty 1

## 2017-03-01 MED ORDER — SODIUM CHLORIDE 0.9 % IV SOLN
Freq: Once | INTRAVENOUS | Status: AC
  Administered 2017-03-01: 12:00:00 via INTRAVENOUS

## 2017-03-01 MED ORDER — ACETAMINOPHEN 325 MG PO TABS
650.0000 mg | ORAL_TABLET | Freq: Once | ORAL | Status: AC
  Administered 2017-03-01: 650 mg via ORAL

## 2017-03-01 NOTE — Patient Instructions (Signed)
[facility] Discharge Instructions for Patients Receiving Chemotherapy  Today you received the following chemotherapy agents:  Herceptin, (trastuzumab)  To help prevent nausea and vomiting after your treatment, we encourage you to take your nausea medication as prescribed.    If you develop nausea and vomiting that is not controlled by your nausea medication, call the clinic.   BELOW ARE SYMPTOMS THAT SHOULD BE REPORTED IMMEDIATELY:  *FEVER GREATER THAN 100.5 F  *CHILLS WITH OR WITHOUT FEVER  NAUSEA AND VOMITING THAT IS NOT CONTROLLED WITH YOUR NAUSEA MEDICATION  *UNUSUAL SHORTNESS OF BREATH  *UNUSUAL BRUISING OR BLEEDING  TENDERNESS IN MOUTH AND THROAT WITH OR WITHOUT PRESENCE OF ULCERS  *URINARY PROBLEMS  *BOWEL PROBLEMS  UNUSUAL RASH Items with * indicate a potential emergency and should be followed up as soon as possible.  Feel free to call the clinic you have any questions or concerns. The clinic phone number is [phone].  Please show the CHEMO ALERT CARD at check-in to the Emergency Department and triage nurse.     Trastuzumab injection for infusion What is this medicine? TRASTUZUMAB (tras TOO zoo mab) is a monoclonal antibody. It is used to treat breast cancer and stomach cancer. This medicine may be used for other purposes; ask your health care provider or pharmacist if you have questions. COMMON BRAND NAME(S): Herceptin What should I tell my health care provider before I take this medicine? They need to know if you have any of these conditions: -heart disease -heart failure -lung or breathing disease, like asthma -an unusual or allergic reaction to trastuzumab, benzyl alcohol, or other medications, foods, dyes, or preservatives -pregnant or trying to get pregnant -breast-feeding How should I use this medicine? This drug is given as an infusion into a vein. It is administered in a hospital or clinic by a specially trained health care  professional. Talk to your pediatrician regarding the use of this medicine in children. This medicine is not approved for use in children. Overdosage: If you think you have taken too much of this medicine contact a poison control center or emergency room at once. NOTE: This medicine is only for you. Do not share this medicine with others. What if I miss a dose? It is important not to miss a dose. Call your doctor or health care professional if you are unable to keep an appointment. What may interact with this medicine? This medicine may interact with the following medications: -certain types of chemotherapy, such as daunorubicin, doxorubicin, epirubicin, and idarubicin This list may not describe all possible interactions. Give your health care provider a list of all the medicines, herbs, non-prescription drugs, or dietary supplements you use. Also tell them if you smoke, drink alcohol, or use illegal drugs. Some items may interact with your medicine. What should I watch for while using this medicine? Visit your doctor for checks on your progress. Report any side effects. Continue your course of treatment even though you feel ill unless your doctor tells you to stop. Call your doctor or health care professional for advice if you get a fever, chills or sore throat, or other symptoms of a cold or flu. Do not treat yourself. Try to avoid being around people who are sick. You may experience fever, chills and shaking during your first infusion. These effects are usually mild and can be treated with other medicines. Report any side effects during the infusion to your health care professional. Fever and chills usually do not happen with later infusions.   Do not become pregnant while taking this medicine or for 7 months after stopping it. Women should inform their doctor if they wish to become pregnant or think they might be pregnant. Women of child-bearing potential will need to have a negative pregnancy test  before starting this medicine. There is a potential for serious side effects to an unborn child. Talk to your health care professional or pharmacist for more information. Do not breast-feed an infant while taking this medicine or for 7 months after stopping it. Women must use effective birth control with this medicine. What side effects may I notice from receiving this medicine? Side effects that you should report to your doctor or health care professional as soon as possible: -allergic reactions like skin rash, itching or hives, swelling of the face, lips, or tongue -chest pain or palpitations -cough -dizziness -feeling faint or lightheaded, falls -fever -general ill feeling or flu-like symptoms -signs of worsening heart failure like breathing problems; swelling in your legs and feet -unusually weak or tired Side effects that usually do not require medical attention (report to your doctor or health care professional if they continue or are bothersome): -bone pain -changes in taste -diarrhea -joint pain -nausea/vomiting -weight loss This list may not describe all possible side effects. Call your doctor for medical advice about side effects. You may report side effects to FDA at 1-800-FDA-1088. Where should I keep my medicine? This drug is given in a hospital or clinic and will not be stored at home. NOTE: This sheet is a summary. It may not cover all possible information. If you have questions about this medicine, talk to your doctor, pharmacist, or health care provider.  2018 Elsevier/Gold Standard (2016-07-12 14:37:52)  

## 2017-03-03 ENCOUNTER — Telehealth: Payer: Self-pay | Admitting: *Deleted

## 2017-03-03 NOTE — Telephone Encounter (Signed)
Left message on voicemail for pt to call office to discuss possible side effects, symptom management.

## 2017-03-06 NOTE — Telephone Encounter (Signed)
Received call from [name] for chemotherapy F/U.  "I did not call Tanya back because I was not feeling well.  Wednesday, I felt cold but felt it was the room.  Left there with chills in the car.  Arrived home had to wear two coats, blankets, temperature up to 100.4 at 9:00 pm.  Not 100.5  so I did not call but took ES Tylenol, used cool compresses, drank lots of water.  Headache started Thursday.  On anastrozole, hot flashes three times a day now.  Body aches are normal because I already have arthritis.  Went through a box of Kleenex yesterday with a runny nose.  History of seasonal allergies."  Patient doing well today.  Denies fever, n/v, any new side effects or symptoms.  "Bowels are erratic but moved last night."   Bladder functioning well.  Eating one meal a day with small meals like yogurt for breakfast as normal.  Drinking well.  Instructed to drink 64 oz minimum daily or at least the day before, of and after treatment.  Answered questions about taking seasonal allergy pill, tylenol for headache and what number to call after hours.  Denies further questions at this time.  Encouraged to call if needed.  Reviewed how to call after hours in the case of an emergency.  See's provider before next Herceptin.  Expressed desire for treatment to proceed at faster rate per protocol for second Herceptin administration.

## 2017-03-07 ENCOUNTER — Ambulatory Visit: Payer: Medicare Other | Admitting: Physical Therapy

## 2017-03-13 ENCOUNTER — Encounter: Payer: Self-pay | Admitting: Physical Therapy

## 2017-03-13 ENCOUNTER — Ambulatory Visit: Payer: Medicare Other | Attending: Oncology | Admitting: Physical Therapy

## 2017-03-13 DIAGNOSIS — M25512 Pain in left shoulder: Secondary | ICD-10-CM | POA: Diagnosis present

## 2017-03-13 DIAGNOSIS — G8929 Other chronic pain: Secondary | ICD-10-CM

## 2017-03-13 DIAGNOSIS — M25511 Pain in right shoulder: Secondary | ICD-10-CM | POA: Insufficient documentation

## 2017-03-13 DIAGNOSIS — M6281 Muscle weakness (generalized): Secondary | ICD-10-CM

## 2017-03-13 DIAGNOSIS — M25612 Stiffness of left shoulder, not elsewhere classified: Secondary | ICD-10-CM

## 2017-03-13 DIAGNOSIS — M25611 Stiffness of right shoulder, not elsewhere classified: Secondary | ICD-10-CM

## 2017-03-13 DIAGNOSIS — R293 Abnormal posture: Secondary | ICD-10-CM | POA: Diagnosis present

## 2017-03-13 NOTE — Therapy (Signed)
[place], [place], [address] Phone: [phone]   Fax:  [phone]  Physical Therapy Evaluation  Patient Details  Name: [name] MRN: [id number] Date of Birth: 1941/02/01 Referring Provider: Magrinat  Encounter Date: 03/13/2017      PT End of Session - 03/13/17 1701    Visit Number 1   Number of Visits 9   Date for PT Re-Evaluation 04/10/17   PT Start Time 1508   PT Stop Time 1550   PT Time Calculation (min) 42 min   Activity Tolerance Patient tolerated treatment well   Behavior During Therapy [facility] for tasks assessed/performed      Past Medical History:  Diagnosis Date  . Anxiety   . Degenerative disk disease   . Diverticulosis   . Hyperlipemia   . LBP (low back pain)   . Osteoarthritis   . PONV (postoperative nausea and vomiting)   . Vitamin B deficiency   . Vitamin D deficiency     Past Surgical History:  Procedure Laterality Date  . ABDOMINAL HYSTERECTOMY  2005   SUPRACERVICAL HYSTERECTOMY  . APPENDECTOMY    . BREAST BIOPSY Left 06/03/2013   Procedure: BREAST BIOPSY WITH NEEDLE LOCALIZATION;  Surgeon: [name], MD;  Location: [place];  Service: General;  Laterality: Lef also lumpectomy;  . BREAST BIOPSY Left 12/21/2016  . BREAST LUMPECTOMY WITH RADIOACTIVE SEED AND SENTINEL LYMPH NODE BIOPSY Left 01/25/2017   Procedure: LEFT BREAST LUMPECTOMY WITH RADIOACTIVE SEED AND SENTINEL LYMPH NODE BIOPSY;  Surgeon: Jovita Kussmaul, MD;  Location: [place];  Service: General;  Laterality: Left;  . BREAST SURGERY  2000   breast reduction... BREAST BIOPSY ON OCTOBER 2014  . cataract surgery Bilateral   . CERVICAL FUSION  1999 & 2009   C3-4 [facility]ner  . EYE SURGERY Left 2012   cataract  . JOINT REPLACEMENT    . SPINE SURGERY  1999&2009  . TONSILLECTOMY    . TOTAL HIP ARTHROPLASTY  (L) 2007 (R) 2011   [facility]    There were no vitals filed for this visit.        Subjective Assessment - 03/13/17 1503    Subjective I don't have pain in my left shoulder. Sometimes I have pain in my right shoulder in the upper part of my arm. I am here to learn about lymphedema. Pt reports she gets cramping in the upper part of her arms. She does not lift her right arm over her head much anymore secondary to pain.    Pertinent History 12/21/2016 biopsy of the left breast upper outer quadrant area in question showed ([id number]) and invasive lobular carcinoma, grade 1, estrogen receptor 95% positive, progesterone receptor 80% positive, with an MIB-1 of 15%, and HER-2 amplified. Katelen underwent left lumpectomy and sentinel lymph node sampling. The date was 01/25/2017. 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 The single sentinel lymph node was clear. DDD, bilateral hip replacements   Patient Stated Goals to learn how to reduce my risk of swelling   Currently in Pain? No/denies            [facility] PT Assessment - 03/13/17 0001      Assessment   Medical Diagnosis left breast cancer   Referring Provider Magrinat   Onset Date/Surgical Date 01/25/17   Hand Dominance Right   Prior Therapy none     Precautions   Precautions --  bilateral hip replacements, at risk for lymphedema     Restrictions   Weight Bearing Restrictions No     Balance Screen   Has the patient fallen in the past 6 months No   Has the patient had a decrease in activity level because of a fear of falling?  No   Is the patient reluctant to leave their home because of a fear of falling?  No     Home Environment   Living Environment Private residence   Living Arrangements Children   Available Help at Discharge Family   Type of [place] to enter   Entrance Stairs-Number of Steps 2   Entrance Stairs-Rails None   Home Layout Two level   Alternate Level Stairs-Number of Steps 14   Alternate Level Stairs-Rails  Right;Left  right for half, left for half     Prior Function   Level of Independence Independent   Vocation Retired   Leisure pt is not exercising     Cognition   Overall Cognitive Status Within Functional Limits for tasks assessed     Posture/Postural Control   Posture/Postural Control Postural limitations   Postural Limitations Forward head;Rounded Shoulders     ROM / Strength   AROM / PROM / Strength AROM;Strength     AROM   AROM Assessment Site Shoulder   Right/Left Shoulder Right;Left   Right Shoulder Flexion 149 Degrees   Right Shoulder ABduction 135 Degrees   Right Shoulder Internal Rotation 63 Degrees  pain in anterior shoulder   Right Shoulder External Rotation 69 Degrees   Left Shoulder Flexion 144 Degrees  ache in top of shoulder   Left Shoulder ABduction 124 Degrees  ache in top of shoulder   Left Shoulder Internal Rotation 63 Degrees  pain in top of shoulder   Left Shoulder External Rotation 64 Degrees     Strength   Overall Strength Comments biceps- 5/5, triceps 3/5, R shoulder abd 3+/5, L abd 3/5, bilateral shoulder flexion 4/5           LYMPHEDEMA/ONCOLOGY QUESTIONNAIRE - 03/13/17 1509      Type   Cancer Type left breast cancer     Surgeries   Lumpectomy Date 01/25/17   Sentinel Lymph Node Biopsy Date 01/25/17   Number Lymph Nodes Removed 2     Treatment   Active Chemotherapy Treatment No   Past Chemotherapy Treatment No   Active Radiation Treatment No   Past Radiation Treatment No   Current Hormone Treatment Yes   Drug Name anastozole     What other symptoms do you have   Are you Having Heaviness or Tightness No   Are you having Pain Yes   Are you having pitting edema No   Is it Hard or Difficult finding clothes that fit No   Do you have infections No   Is there Decreased scar mobility No     Lymphedema Assessments   Lymphedema Assessments Upper extremities     Right Upper Extremity Lymphedema   15 cm Proximal to Olecranon  Process 31.9 cm   Olecranon Process 24.4 cm   15 cm Proximal to Ulnar Styloid Process 23 cm   Just Proximal to Ulnar Styloid Process 14.1 cm   Across Hand at [employer] 17.5 cm   At [place] of 2nd Digit 5.5 cm     Left Upper Extremity Lymphedema   15 cm Proximal to Olecranon Process 33.6 cm   Olecranon Process  24 cm   15 cm Proximal to Ulnar Styloid Process 22.5 cm   Just Proximal to Ulnar Styloid Process 13.9 cm   Across Hand at [employer] 17 cm   At [place] of 2nd Digit 5.6 cm           Quick Dash - 03/13/17 0001    Open a tight or new jar Unable   Do heavy household chores (wash walls, wash floors) Moderate difficulty   Carry a shopping bag or briefcase Moderate difficulty   Wash your back Moderate difficulty   Use a knife to cut food No difficulty   Recreational activities in which you take some force or impact through your arm, shoulder, or hand (golf, hammering, tennis) No difficulty   During the past week, to what extent has your arm, shoulder or hand problem interfered with your normal social activities with family, friends, neighbors, or groups? Not at all   During the past week, to what extent has your arm, shoulder or hand problem limited your work or other regular daily activities Not at all   Arm, shoulder, or hand pain. Mild   Tingling (pins and needles) in your arm, shoulder, or hand None   Difficulty Sleeping No difficulty   DASH Score 25 %      Objective measurements completed on examination: See above findings.                  PT Education - 03/13/17 1701    Education provided Yes   Education Details anatomy and physiology of lymphatic system, lymphedema risk reduction practices   Person(s) Educated Patient   Methods Explanation;Handout   Comprehension Verbalized understanding                [facility] Goals - 03/13/17 1708      CC Long Term Goal  #1   Title Pt will be able to independently verbalize lymphedema risk  reduction practices   Time 4   Period Weeks   Status New   Target Date 04/10/17     CC Long Term Goal  #2   Title Pt will be independent in a home exercise program for continued strengthening and stretching for posture   Time 4   Period Weeks   Status New   Target Date 04/10/17     CC Long Term Goal  #3   Title Pt will demonstrate left shoulder abduction strength of 4/5 to allow pt to return to prior level of function   Baseline 3/5   Time 4   Period Weeks   Status New   Target Date 04/10/17     CC Long Term Goal  #4   Title Pt will demonstrate 140 degrees of left shoulder abduction to allow pt to reach items out to sides.   Baseline 124   Time 4   Period Weeks   Status New   Target Date 04/10/17     CC Long Term Goal  #5   Title Pt will report at least a 60% improvement in pain in bilateral upper arms to allow improved function.   Time 4   Period Weeks   Status New             Plan - 03/13/17 1702    Clinical Impression Statement Patient presents to PT following diagnosis with left breast cancer. Pt has had bilateral shoulder pain with movement overhead and with internal rotation. She also wanted to be educated in  lymphedema risk reduction techniques. Pt presents with abnormal posture characterized by forward head and rounded shoulders that is contributing to shoulder dysfunction and pain. Pt reports she uses the computer at home and reads often which may be contributing to her posture. Pt also has weakness and decreased range of motion in bilateral shoulders.  Pt would benefit from skilled PT services to educated pt about lymphedema risk reduction practices, strengthen bilateral shoulders, improve posture and decrease pain.    History and Personal Factors relevant to plan of care: pt is right handed, she reports she feels overwhelmed at times with all the new information given to her regarding cancer   Clinical Presentation Stable   Clinical Decision Making Low    Rehab Potential Good   Clinical Impairments Affecting Rehab Potential hx of radiation   PT Frequency 2x / week   PT Duration 4 weeks   PT Treatment/Interventions ADLs/Self Care Home Management;Electrical Stimulation;Iontophoresis 7m/ml Dexamethasone;Therapeutic exercise;Therapeutic activities;Patient/family education;Passive range of motion;Manual techniques   PT Next Visit Plan assist pt with prophylactic sleeve, gentle ROM to bilateral shoulders, soft tissue to upper arms, supine scap   Consulted and Agree with Plan of Care Patient      Patient will benefit from skilled therapeutic intervention in order to improve the following deficits and impairments:  Pain, Postural dysfunction, Decreased range of motion, Decreased strength, Decreased knowledge of precautions  Visit Diagnosis: Abnormal posture - Plan: PT plan of care cert/re-cert  Muscle weakness (generalized) - Plan: PT plan of care cert/re-cert  Stiffness of left shoulder, not elsewhere classified - Plan: PT plan of care cert/re-cert  Chronic left shoulder pain - Plan: PT plan of care cert/re-cert  Stiffness of right shoulder, not elsewhere classified - Plan: PT plan of care cert/re-cert  Chronic right shoulder pain - Plan: PT plan of care cert/re-cert      G-Codes - 053/61/441712    Functional Assessment Tool Used (Outpatient Only) Quick Dash   Functional Limitation Carrying, moving and handling objects   Carrying, Moving and Handling Objects Current Status ((R1540 At least 20 percent but less than 40 percent impaired, limited or restricted   Carrying, Moving and Handling Objects Goal Status (([id number] At least 1 percent but less than 20 percent impaired, limited or restricted       Problem List Patient Active Problem List   Diagnosis Date Noted  . Malignant neoplasm of upper-outer quadrant of left breast in female, estrogen receptor positive ([place] 01/04/2017  . 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 Lichen sclerosus  et atrophicus 06/16/2016  . Osteopenia 06/16/2016  . Radiculopathy of lumbar region 03/03/2016  . Well adult exam 09/28/2015  . Conjunctivitis 06/12/2015  . Insomnia 02/09/2015  . Osteoarthritis of both knees 02/09/2015  . Hemorrhoid 02/09/2015  . Memory impairment 10/20/2014  . Abdominal pain 10/20/2014  . B12 deficiency 11/13/2013  . Right maxillary sinusitis 10/28/2013  . Chest pain 07/17/2013  . Situational mixed anxiety and depressive disorder 10/28/2012  . Vaginal atrophy 04/26/2011  . Post-menopausal 04/26/2011  . PARESTHESIA 09/28/2010  . Obesity 03/15/2010  . HIP PAIN 03/15/2010  . ALOPECIA 12/08/2009  . SINUSITIS, ACUTE 07/20/2009  . TOBACCO USE, QUIT 06/12/2009  . INGUINAL PAIN, RIGHT 08/12/2008  . UPPER RESPIRATORY INFECTION (URI) 09/19/2007  . NECK PAIN 06/06/2007  . Vitamin D deficiency 05/02/2007  . Dyslipidemia 05/02/2007  . ANEMIA, VITAMIN B12 DEFICIENCY NEC 05/02/2007  . Anxiety state 05/02/2007  . OSTEOARTHRITIS 05/02/2007  . LOW BACK PAIN 05/02/2007    Evan Mackie  Hollie Salk [facility] 03/13/2017, 5:14 PM  [place], [place], [address] Phone: [phone]   Fax:  [phone]  Name: [name] MRN: [id number] Date of Birth: 05-02-1941  Manus Gunning, PT 03/13/17 5:14 PM

## 2017-03-14 ENCOUNTER — Encounter: Payer: Self-pay | Admitting: Physical Therapy

## 2017-03-14 ENCOUNTER — Ambulatory Visit: Payer: Medicare Other | Admitting: Physical Therapy

## 2017-03-14 DIAGNOSIS — M25612 Stiffness of left shoulder, not elsewhere classified: Secondary | ICD-10-CM

## 2017-03-14 DIAGNOSIS — G8929 Other chronic pain: Secondary | ICD-10-CM

## 2017-03-14 DIAGNOSIS — R293 Abnormal posture: Secondary | ICD-10-CM

## 2017-03-14 DIAGNOSIS — M25512 Pain in left shoulder: Secondary | ICD-10-CM

## 2017-03-14 DIAGNOSIS — M25511 Pain in right shoulder: Secondary | ICD-10-CM

## 2017-03-14 DIAGNOSIS — M6281 Muscle weakness (generalized): Secondary | ICD-10-CM

## 2017-03-14 DIAGNOSIS — M25611 Stiffness of right shoulder, not elsewhere classified: Secondary | ICD-10-CM

## 2017-03-14 NOTE — Therapy (Signed)
[place], [place], [address] Phone: [phone]   Fax:  [phone]  Physical Therapy Treatment  Patient Details  Name: [name] MRN: [id number] Date of Birth: 08-07-1940 Referring Provider: Magrinat  Encounter Date: 03/14/2017      PT End of Session - 03/14/17 1721    Visit Number 2   Number of Visits 9   Date for PT Re-Evaluation 04/10/17   PT Start Time 1518   PT Stop Time 1602   PT Time Calculation (min) 44 min   Activity Tolerance Patient tolerated treatment well   Behavior During Therapy [facility] for tasks assessed/performed      Past Medical History:  Diagnosis Date  . Anxiety   . Degenerative disk disease   . Diverticulosis   . Hyperlipemia   . LBP (low back pain)   . Osteoarthritis   . PONV (postoperative nausea and vomiting)   . Vitamin B deficiency   . Vitamin D deficiency     Past Surgical History:  Procedure Laterality Date  . ABDOMINAL HYSTERECTOMY  2005   SUPRACERVICAL HYSTERECTOMY  . APPENDECTOMY    . BREAST BIOPSY Left 06/03/2013   Procedure: BREAST BIOPSY WITH NEEDLE LOCALIZATION;  Surgeon: [name], MD;  Location: [facility];  Service: General;  Laterality: Lef also lumpectomy;  . BREAST BIOPSY Left 12/21/2016  . BREAST LUMPECTOMY WITH RADIOACTIVE SEED AND SENTINEL LYMPH NODE BIOPSY Left 01/25/2017   Procedure: LEFT BREAST LUMPECTOMY WITH RADIOACTIVE SEED AND SENTINEL LYMPH NODE BIOPSY;  Surgeon: Jovita Kussmaul, MD;  Location: [facility];  Service: General;  Laterality: Left;  . BREAST SURGERY  2000   breast reduction... BREAST BIOPSY ON OCTOBER 2014  . cataract surgery Bilateral   . CERVICAL FUSION  1999 & 2009   C3-4 [facility]ner  . EYE SURGERY Left 2012   cataract  . JOINT REPLACEMENT    . SPINE SURGERY  1999&2009  . TONSILLECTOMY    . TOTAL HIP ARTHROPLASTY  (L) 2007 (R) 2011   [facility]    There were no vitals filed for this visit.       Subjective Assessment - 03/14/17 1521    Subjective My left shoulder hurts a little bit today when I move it.    Pertinent History 12/21/2016 biopsy of the left breast upper outer quadrant area in question showed ([id number]) and invasive lobular carcinoma, grade 1, estrogen receptor 95% positive, progesterone receptor 80% positive, with an MIB-1 of 15%, and HER-2 amplified. [name] underwent left lumpectomy and sentinel lymph node sampling. The date was 01/25/2017. 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 The single sentinel lymph node was clear. DDD, bilateral hip replacements   Patient Stated Goals to learn how to reduce my risk of swelling   Currently in Pain? Yes   Pain Score 2    Pain Location Shoulder   Pain Orientation Left   Pain Descriptors / Indicators Aching   Pain Type Chronic pain   Pain Onset More than a month ago   Pain Frequency Intermittent   Aggravating Factors  moving arm   Pain Relieving Factors cannabis cream   Effect of Pain on Daily Activities hard to lift arm               LYMPHEDEMA/ONCOLOGY QUESTIONNAIRE - 03/13/17 1509      Type   Cancer Type left breast cancer     Surgeries  Lumpectomy Date 01/25/17   Sentinel Lymph Node Biopsy Date 01/25/17   Number Lymph Nodes Removed 2     Treatment   Active Chemotherapy Treatment No   Past Chemotherapy Treatment No   Active Radiation Treatment No   Past Radiation Treatment No   Current Hormone Treatment Yes   Drug Name anastozole     What other symptoms do you have   Are you Having Heaviness or Tightness No   Are you having Pain Yes   Are you having pitting edema No   Is it Hard or Difficult finding clothes that fit No   Do you have infections No   Is there Decreased scar mobility No     Lymphedema Assessments   Lymphedema Assessments Upper extremities     Right Upper Extremity Lymphedema   15 cm Proximal to Olecranon Process 31.9 cm    Olecranon Process 24.4 cm   15 cm Proximal to Ulnar Styloid Process 23 cm   Just Proximal to Ulnar Styloid Process 14.1 cm   Across Hand at [employer] 17.5 cm   At [place] of 2nd Digit 5.5 cm     Left Upper Extremity Lymphedema   15 cm Proximal to Olecranon Process 33.6 cm   Olecranon Process 24 cm   15 cm Proximal to Ulnar Styloid Process 22.5 cm   Just Proximal to Ulnar Styloid Process 13.9 cm   Across Hand at [employer] 17 cm   At [place] of 2nd Digit 5.6 cm                  [facility] Adult PT Treatment/Exercise - 03/14/17 0001      Exercises   Exercises Shoulder     Shoulder Exercises: Supine   Horizontal ABduction Strengthening;Both;10 reps;Theraband   Theraband Level (Shoulder Horizontal ABduction) Level 1 (Yellow)   External Rotation Strengthening;Both;10 reps;Theraband   Theraband Level (Shoulder External Rotation) Level 1 (Yellow)     Manual Therapy   Manual Therapy Soft tissue mobilization;Passive ROM   Soft tissue mobilization to L upper arm in area of pain- increased tightness noted here that released with STM   Passive ROM to B UE into flexion and abduction with slight pulling into abduction                PT Education - 03/13/17 1701    Education provided Yes   Education Details anatomy and physiology of lymphatic system, lymphedema risk reduction practices   Person(s) Educated Patient   Methods Explanation;Handout   Comprehension Verbalized understanding                [facility] Goals - 03/13/17 1708      CC Long Term Goal  #1   Title Pt will be able to independently verbalize lymphedema risk reduction practices   Time 4   Period Weeks   Status New   Target Date 04/10/17     CC Long Term Goal  #2   Title Pt will be independent in a home exercise program for continued strengthening and stretching for posture   Time 4   Period Weeks   Status New   Target Date 04/10/17     CC Long Term Goal  #3   Title Pt will  demonstrate left shoulder abduction strength of 4/5 to allow pt to return to prior level of function   Baseline 3/5   Time 4   Period Weeks   Status New   Target  Date 04/10/17     CC Long Term Goal  #4   Title Pt will demonstrate 140 degrees of left shoulder abduction to allow pt to reach items out to sides.   Baseline 124   Time 4   Period Weeks   Status New   Target Date 04/10/17     CC Long Term Goal  #5   Title Pt will report at least a 60% improvement in pain in bilateral upper arms to allow improved function.   Time 4   Period Weeks   Status New            Plan - 03/14/17 1722    Clinical Impression Statement Patient states today she is having pain in her left upper arm. Palpation reveals increased tightness in this area. Pt very tender to the touch to begin with but improved with soft tissue mobilization to this area. PROM performed to bilateral shoulders with pt demonstrating full PROM. Began instruction in supine scapular strengthening exercises to improve posture and allow for improved bilateral shoulder ROM. Pt is not interested in gettting a compression sleeve at this time but was given information for obtaining one in the future if she wants to.    Rehab Potential Good   Clinical Impairments Affecting Rehab Potential hx of radiation   PT Frequency 2x / week   PT Duration 4 weeks   PT Treatment/Interventions ADLs/Self Care Home Management;Electrical Stimulation;Iontophoresis 78m/ml Dexamethasone;Therapeutic exercise;Therapeutic activities;Patient/family education;Passive range of motion;Manual techniques   PT Next Visit Plan soft tissue to upper arms, issue supine scap as part of HEP, continue with posture exercises   Consulted and Agree with Plan of Care Patient      Patient will benefit from skilled therapeutic intervention in order to improve the following deficits and impairments:  Pain, Postural dysfunction, Decreased range of motion, Decreased strength,  Decreased knowledge of precautions  Visit Diagnosis: Abnormal posture  Muscle weakness (generalized)  Stiffness of left shoulder, not elsewhere classified  Chronic left shoulder pain  Stiffness of right shoulder, not elsewhere classified  Chronic right shoulder pain       G-Codes - 02018/08/281712    Functional Assessment Tool Used (Outpatient Only) Quick Dash   Functional Limitation Carrying, moving and handling objects   Carrying, Moving and Handling Objects Current Status [phone] At least 20 percent but less than 40 percent impaired, limited or restricted   Carrying, Moving and Handling Objects Goal Status (([id number] At least 1 percent but less than 20 percent impaired, limited or restricted      Problem List Patient Active Problem List   Diagnosis Date Noted  . Malignant neoplasm of upper-outer quadrant of left breast in female, estrogen receptor positive ([place] 01/04/2017  . 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 Lichen sclerosus et atrophicus 06/16/2016  . Osteopenia 06/16/2016  . Radiculopathy of lumbar region 03/03/2016  . Well adult exam 09/28/2015  . Conjunctivitis 06/12/2015  . Insomnia 02/09/2015  . Osteoarthritis of both knees 02/09/2015  . Hemorrhoid 02/09/2015  . Memory impairment 10/20/2014  . Abdominal pain 10/20/2014  . B12 deficiency 11/13/2013  . Right maxillary sinusitis 10/28/2013  . Chest pain 07/17/2013  . Situational mixed anxiety and depressive disorder 10/28/2012  . Vaginal atrophy 04/26/2011  . Post-menopausal 04/26/2011  . PARESTHESIA 09/28/2010  . Obesity 03/15/2010  . HIP PAIN 03/15/2010  . ALOPECIA 12/08/2009  . SINUSITIS, ACUTE 07/20/2009  . TOBACCO USE, QUIT 06/12/2009  . INGUINAL PAIN, RIGHT 08/12/2008  . UPPER RESPIRATORY INFECTION (URI)  09/19/2007  . NECK PAIN 06/06/2007  . Vitamin D deficiency 05/02/2007  . Dyslipidemia 05/02/2007  . ANEMIA, VITAMIN B12 DEFICIENCY NEC 05/02/2007  . Anxiety state 05/02/2007  . OSTEOARTHRITIS  05/02/2007  . LOW BACK PAIN 05/02/2007    Allyson Sabal [facility] 03/14/2017, 5:25 PM  [place], [place], [address] Phone: [phone]   Fax:  [phone]  Name: [name] MRN: [id number] Date of Birth: 29-Sep-1940  Manus Gunning, PT 03/14/17 5:25 PM

## 2017-03-16 ENCOUNTER — Encounter: Payer: Self-pay | Admitting: Physical Therapy

## 2017-03-16 ENCOUNTER — Ambulatory Visit: Payer: Medicare Other | Admitting: Physical Therapy

## 2017-03-16 DIAGNOSIS — M25512 Pain in left shoulder: Secondary | ICD-10-CM

## 2017-03-16 DIAGNOSIS — R293 Abnormal posture: Secondary | ICD-10-CM | POA: Diagnosis not present

## 2017-03-16 DIAGNOSIS — M6281 Muscle weakness (generalized): Secondary | ICD-10-CM

## 2017-03-16 DIAGNOSIS — M25611 Stiffness of right shoulder, not elsewhere classified: Secondary | ICD-10-CM

## 2017-03-16 DIAGNOSIS — M25511 Pain in right shoulder: Secondary | ICD-10-CM

## 2017-03-16 DIAGNOSIS — G8929 Other chronic pain: Secondary | ICD-10-CM

## 2017-03-16 DIAGNOSIS — M25612 Stiffness of left shoulder, not elsewhere classified: Secondary | ICD-10-CM

## 2017-03-16 NOTE — Patient Instructions (Signed)

## 2017-03-16 NOTE — Therapy (Signed)
[place], [place], [address] Phone: [phone]   Fax:  [phone]  Physical Therapy Treatment  Patient Details  Name: [name] MRN: [id number] Date of Birth: 03-07-1941 Referring Provider: Magrinat  Encounter Date: 03/16/2017      PT End of Session - 03/16/17 1651    Visit Number 3   Number of Visits 9   Date for PT Re-Evaluation 04/10/17   PT Start Time 1603   PT Stop Time 1648   PT Time Calculation (min) 45 min   Activity Tolerance Patient tolerated treatment well   Behavior During Therapy [facility] for tasks assessed/performed      Past Medical History:  Diagnosis Date  . Anxiety   . Degenerative disk disease   . Diverticulosis   . Hyperlipemia   . LBP (low back pain)   . Osteoarthritis   . PONV (postoperative nausea and vomiting)   . Vitamin B deficiency   . Vitamin D deficiency     Past Surgical History:  Procedure Laterality Date  . ABDOMINAL HYSTERECTOMY  2005   SUPRACERVICAL HYSTERECTOMY  . APPENDECTOMY    . BREAST BIOPSY Left 06/03/2013   Procedure: BREAST BIOPSY WITH NEEDLE LOCALIZATION;  Surgeon: [name], MD;  Location: [place];  Service: General;  Laterality: Lef also lumpectomy;  . BREAST BIOPSY Left 12/21/2016  . BREAST LUMPECTOMY WITH RADIOACTIVE SEED AND SENTINEL LYMPH NODE BIOPSY Left 01/25/2017   Procedure: LEFT BREAST LUMPECTOMY WITH RADIOACTIVE SEED AND SENTINEL LYMPH NODE BIOPSY;  Surgeon: Jovita Kussmaul, MD;  Location: [place];  Service: General;  Laterality: Left;  . BREAST SURGERY  2000   breast reduction... BREAST BIOPSY ON OCTOBER 2014  . cataract surgery Bilateral   . CERVICAL FUSION  1999 & 2009   C3-4 [facility]sner  . EYE SURGERY Left 2012   cataract  . JOINT REPLACEMENT    . SPINE SURGERY  1999&2009  . TONSILLECTOMY    . TOTAL HIP ARTHROPLASTY  (L) 2007 (R) 2011   Alusio    There were no vitals filed for this visit.       Subjective Assessment - 03/16/17 1606    Subjective I woke up with a vicious headache this morning. I took tylenol and it is better today. I wasn't sore after last session.    Pertinent History 12/21/2016 biopsy of the left breast upper outer quadrant area in question showed ([id number]) and invasive lobular carcinoma, grade 1, estrogen receptor 95% positive, progesterone receptor 80% positive, with an MIB-1 of 15%, and HER-2 amplified. Shakiya underwent left lumpectomy and sentinel lymph node sampling. The date was 01/25/2017. 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 The single sentinel lymph node was clear. DDD, bilateral hip replacements   Patient Stated Goals to learn how to reduce my risk of swelling   Currently in Pain? Yes   Pain Score 5    Pain Location Back   Pain Orientation Lower   Pain Descriptors / Indicators Aching   Pain Type Chronic pain                         [facility] Adult PT Treatment/Exercise - 03/16/17 0001      Lumbar Exercises: Prone   Other Prone Lumbar Exercises prone push up for back extension to decrease back pain     Shoulder Exercises: Supine   Horizontal ABduction Strengthening;Both;10  reps;Theraband   Theraband Level (Shoulder Horizontal ABduction) Level 1 (Yellow)   External Rotation Strengthening;Both;10 reps;Theraband   Theraband Level (Shoulder External Rotation) Level 1 (Yellow)   Flexion Strengthening;Both;10 reps;Theraband   Theraband Level (Shoulder Flexion) Level 1 (Yellow)   Other Supine Exercises D2 x 10 reps bilaterally with yellow theraband     Manual Therapy   Manual Therapy Soft tissue mobilization;Passive ROM   Soft tissue mobilization to L and R upper arm in area of tightness and tenderness   Passive ROM to B UE into flexion and abduction with slight pulling into abduction                        Long Term Clinic Goals - 03/13/17 1708      CC  Long Term Goal  #1   Title Pt will be able to independently verbalize lymphedema risk reduction practices   Time 4   Period Weeks   Status New   Target Date 04/10/17     CC Long Term Goal  #2   Title Pt will be independent in a home exercise program for continued strengthening and stretching for posture   Time 4   Period Weeks   Status New   Target Date 04/10/17     CC Long Term Goal  #3   Title Pt will demonstrate left shoulder abduction strength of 4/5 to allow pt to return to prior level of function   Baseline 3/5   Time 4   Period Weeks   Status New   Target Date 04/10/17     CC Long Term Goal  #4   Title Pt will demonstrate 140 degrees of left shoulder abduction to allow pt to reach items out to sides.   Baseline 124   Time 4   Period Weeks   Status New   Target Date 04/10/17     CC Long Term Goal  #5   Title Pt will report at least a 60% improvement in pain in bilateral upper arms to allow improved function.   Time 4   Period Weeks   Status New            Plan - 03/16/17 1651    Clinical Impression Statement Patient states her left upper arm pain has not bothered her since the massage last session. Continued this to bilateral upper arms today since pt also had tenderness in her right upper arm. Instructed pt in all supine scapular exercises and issued as part of HEP. Also instructed pt in prone push up to help decrease back pain.    Rehab Potential Good   Clinical Impairments Affecting Rehab Potential hx of radiation   PT Frequency 2x / week   PT Duration 4 weeks   PT Treatment/Interventions ADLs/Self Care Home Management;Electrical Stimulation;Iontophoresis 49m/ml Dexamethasone;Therapeutic exercise;Therapeutic activities;Patient/family education;Passive range of motion;Manual techniques   PT Next Visit Plan soft tissue to upper arms, assess indep with supine scap, give Meeks decompression, continue with posture exercises   Consulted and Agree with Plan of Care  Patient      Patient will benefit from skilled therapeutic intervention in order to improve the following deficits and impairments:  Pain, Postural dysfunction, Decreased range of motion, Decreased strength, Decreased knowledge of precautions  Visit Diagnosis: Abnormal posture  Muscle weakness (generalized)  Stiffness of left shoulder, not elsewhere classified  Chronic left shoulder pain  Stiffness of right shoulder, not elsewhere classified  Chronic right shoulder pain  Problem List Patient Active Problem List   Diagnosis Date Noted  . Malignant neoplasm of upper-outer quadrant of left breast in female, estrogen receptor positive ([place]) 01/04/2017  . 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 Lichen sclerosus et atrophicus 06/16/2016  . Osteopenia 06/16/2016  . Radiculopathy of lumbar region 03/03/2016  . Well adult exam 09/28/2015  . Conjunctivitis 06/12/2015  . Insomnia 02/09/2015  . Osteoarthritis of both knees 02/09/2015  . Hemorrhoid 02/09/2015  . Memory impairment 10/20/2014  . Abdominal pain 10/20/2014  . B12 deficiency 11/13/2013  . Right maxillary sinusitis 10/28/2013  . Chest pain 07/17/2013  . Situational mixed anxiety and depressive disorder 10/28/2012  . Vaginal atrophy 04/26/2011  . Post-menopausal 04/26/2011  . PARESTHESIA 09/28/2010  . Obesity 03/15/2010  . HIP PAIN 03/15/2010  . ALOPECIA 12/08/2009  . SINUSITIS, ACUTE 07/20/2009  . TOBACCO USE, QUIT 06/12/2009  . INGUINAL PAIN, RIGHT 08/12/2008  . UPPER RESPIRATORY INFECTION (URI) 09/19/2007  . NECK PAIN 06/06/2007  . Vitamin D deficiency 05/02/2007  . Dyslipidemia 05/02/2007  . ANEMIA, VITAMIN B12 DEFICIENCY NEC 05/02/2007  . Anxiety state 05/02/2007  . OSTEOARTHRITIS 05/02/2007  . LOW BACK PAIN 05/02/2007    Allyson Sabal [facility] 03/16/2017, 4:54 PM  [place], [place], [address] Phone: [phone]   Fax:   [phone]  Name: DEVRA STARE MRN: [id number] Date of Birth: 06-01-1941  Manus Gunning, PT 03/16/17 4:54 PM

## 2017-03-17 ENCOUNTER — Other Ambulatory Visit: Payer: Self-pay | Admitting: Internal Medicine

## 2017-03-17 ENCOUNTER — Encounter: Payer: Self-pay | Admitting: Internal Medicine

## 2017-03-17 ENCOUNTER — Ambulatory Visit (INDEPENDENT_AMBULATORY_CARE_PROVIDER_SITE_OTHER): Payer: Medicare Other | Admitting: Internal Medicine

## 2017-03-17 DIAGNOSIS — E559 Vitamin D deficiency, unspecified: Secondary | ICD-10-CM

## 2017-03-17 DIAGNOSIS — Z170 Estrogen receptor positive status [ER+]: Secondary | ICD-10-CM | POA: Diagnosis not present

## 2017-03-17 DIAGNOSIS — E538 Deficiency of other specified B group vitamins: Secondary | ICD-10-CM

## 2017-03-17 DIAGNOSIS — M5440 Lumbago with sciatica, unspecified side: Secondary | ICD-10-CM | POA: Diagnosis not present

## 2017-03-17 DIAGNOSIS — C50412 Malignant neoplasm of upper-outer quadrant of left female breast: Secondary | ICD-10-CM | POA: Diagnosis not present

## 2017-03-17 MED ORDER — DIAZEPAM 5 MG PO TABS: 5.0000 mg | ORAL_TABLET | Freq: Two times a day (BID) | ORAL | 2 refills | Status: DC | PRN

## 2017-03-17 NOTE — Assessment & Plan Note (Signed)
On B12 

## 2017-03-17 NOTE — Assessment & Plan Note (Signed)
Norco prn  Potential benefits of a long term opioids use as well as potential risks (i.e. addiction risk, apnea etc) and complications (i.e. Somnolence, constipation and others) were explained to the patient and were aknowledged. 

## 2017-03-17 NOTE — Progress Notes (Signed)
Subjective:  Patient ID: [name], female    DOB: 1941/04/18  Age: 76 y.o. MRN: [id number]  CC: No chief complaint on file.   HPI [name] presents for breast cancer - L breast f/u F/u LBP, B12 def, anxiety  Outpatient Medications Prior to Visit  Medication Sig Dispense Refill  . Acetaminophen (TYLENOL ARTHRITIS PAIN PO) Take by mouth.    Marland Kitchen anastrozole (ARIMIDEX) 1 MG tablet Take 1 tablet (1 mg total) by mouth daily. 90 tablet 3  . Ascorbic Acid (VITAMIN C PO) Take by mouth daily.    . Biotin 1000 MCG tablet Take 2,000 mcg by mouth 3 (three) times daily.    . Cholecalciferol (VITAMIN D3) 1000 UNITS tablet Take 2,000 Units by mouth daily.      . diazepam (VALIUM) 5 MG tablet Take 1 tablet (5 mg total) by mouth 2 (two) times daily as needed for anxiety or muscle spasms. 60 tablet 2  . HYDROcodone-acetaminophen (NORCO/VICODIN) 5-325 MG tablet Take 1 tablet by mouth every 6 (six) hours as needed for moderate pain.    . Melatonin 10 MG TABS Take by mouth.    . Omega-3 Fatty Acids (FISH OIL) 1000 MG CAPS Take by mouth.    . traMADol (ULTRAM) 50 MG tablet Take 1-2 tablets (50-100 mg total) by mouth every 6 (six) hours as needed. 30 tablet 1  . vitamin B-12 (CYANOCOBALAMIN) 1000 MCG tablet Take 1,000 mcg by mouth daily.      . vitamin E 1000 UNIT capsule Take 1,000 Units by mouth daily.    Marland Kitchen amoxicillin (AMOXIL) 500 MG capsule Take 500 mg by mouth 2 (two) times daily.  2   No facility-administered medications prior to visit.     ROS Review of Systems  Constitutional: Negative for activity change, appetite change, chills, fatigue and unexpected weight change.  HENT: Negative for congestion, mouth sores and sinus pressure.   Eyes: Negative for visual disturbance.  Respiratory: Negative for cough and chest tightness.   Gastrointestinal: Negative for abdominal pain and nausea.  Genitourinary: Negative for difficulty urinating, frequency and vaginal pain.  Musculoskeletal:  Positive for back pain. Negative for gait problem.  Skin: Negative for pallor and rash.  Neurological: Negative for dizziness, tremors, weakness, numbness and headaches.  Psychiatric/Behavioral: Negative for confusion and sleep disturbance. The patient is nervous/anxious.     Objective:  BP 130/70 (BP Location: Left Arm, Patient Position: Sitting, Cuff Size: Normal)   Pulse 68   Temp 98.1 F (36.7 C) (Oral)   Ht 5\' 4"  (1.626 m)   Wt 193 lb 4 oz (87.7 kg)   SpO2 96%   BMI 33.17 kg/m   BP Readings from Last 3 Encounters:  03/17/17 130/70  03/01/17 (!) 148/80  02/15/17 (!) 153/75    Wt Readings from Last 3 Encounters:  03/17/17 193 lb 4 oz (87.7 kg)  02/15/17 193 lb 14.4 oz (88 kg)  01/25/17 194 lb (88 kg)    Physical Exam  Constitutional: She appears well-developed. No distress.  HENT:  Head: Normocephalic.  Right Ear: External ear normal.  Left Ear: External ear normal.  Nose: Nose normal.  Mouth/Throat: Oropharynx is clear and moist.  Eyes: Pupils are equal, round, and reactive to light. Conjunctivae are normal. Right eye exhibits no discharge. Left eye exhibits no discharge.  Neck: Normal range of motion. Neck supple. No JVD present. No tracheal deviation present. No thyromegaly present.  Cardiovascular: Normal rate, regular rhythm and normal heart sounds.  Pulmonary/Chest: No stridor. No respiratory distress. She has no wheezes.  Abdominal: Soft. Bowel sounds are normal. She exhibits no distension and no mass. There is no tenderness. There is no rebound and no guarding.  Musculoskeletal: She exhibits tenderness. She exhibits no edema.  Lymphadenopathy:    She has no cervical adenopathy.  Neurological: She displays normal reflexes. No cranial nerve deficit. She exhibits normal muscle tone. Coordination normal.  Skin: No rash noted. No erythema.  Psychiatric: She has a normal mood and affect. Her behavior is normal. Judgment and thought content normal.    Lab  Results  Component Value Date   WBC 6.2 03/01/2017   HGB 14.7 03/01/2017   HCT 44.2 03/01/2017   PLT 255 03/01/2017   GLUCOSE 111 03/01/2017   CHOL 221 (H) 10/20/2014   TRIG 140.0 10/20/2014   HDL 54.00 10/20/2014   LDLDIRECT 161.3 06/17/2011   LDLCALC 139 (H) 10/20/2014   ALT 23 03/01/2017   AST 18 03/01/2017   NA 138 03/01/2017   K 3.9 03/01/2017   CL 105 10/20/2014   CREATININE 0.8 03/01/2017   BUN 15.5 03/01/2017   CO2 23 03/01/2017   TSH 2.10 10/20/2014   INR 1.76 (H) 08/29/2009    No results found.  Assessment & Plan:   There are no diagnoses linked to this encounter. I am having [name] maintain her vitamin B-12, cholecalciferol, Biotin, Ascorbic Acid (VITAMIN C PO), Acetaminophen (TYLENOL ARTHRITIS PAIN PO), diazepam, vitamin E, traMADol, anastrozole, amoxicillin, Fish Oil, Melatonin, and HYDROcodone-acetaminophen.  No orders of the defined types were placed in this encounter.    Follow-up: No Follow-up on file.  [name], MD

## 2017-03-17 NOTE — Assessment & Plan Note (Signed)
F/u w/Dr Magrinat On Herceptin IV and Arimidex

## 2017-03-17 NOTE — Assessment & Plan Note (Signed)
Risks associated with treatment noncompliance were discussed. Compliance was encouraged. Vit D 2000 iu/d 

## 2017-03-20 ENCOUNTER — Ambulatory Visit: Payer: Medicare Other | Admitting: Physical Therapy

## 2017-03-20 ENCOUNTER — Encounter: Payer: Self-pay | Admitting: Physical Therapy

## 2017-03-20 DIAGNOSIS — M25512 Pain in left shoulder: Secondary | ICD-10-CM

## 2017-03-20 DIAGNOSIS — M6281 Muscle weakness (generalized): Secondary | ICD-10-CM

## 2017-03-20 DIAGNOSIS — M25611 Stiffness of right shoulder, not elsewhere classified: Secondary | ICD-10-CM

## 2017-03-20 DIAGNOSIS — G8929 Other chronic pain: Secondary | ICD-10-CM

## 2017-03-20 DIAGNOSIS — R293 Abnormal posture: Secondary | ICD-10-CM

## 2017-03-20 DIAGNOSIS — M25511 Pain in right shoulder: Secondary | ICD-10-CM

## 2017-03-20 DIAGNOSIS — M25612 Stiffness of left shoulder, not elsewhere classified: Secondary | ICD-10-CM

## 2017-03-20 NOTE — Patient Instructions (Signed)

## 2017-03-20 NOTE — Therapy (Signed)
[place], [place], [address] Phone: [phone]   Fax:  [phone]  Physical Therapy Treatment  Patient Details  Name: [name] MRN: [id number] Date of Birth: 06-16-1941 Referring Provider: Magrinat  Encounter Date: 03/20/2017      PT End of Session - 03/20/17 1209    Visit Number 4   Number of Visits 9   Date for PT Re-Evaluation 04/10/17   PT Start Time 1016   PT Stop Time 1102   PT Time Calculation (min) 46 min   Activity Tolerance Patient tolerated treatment well   Behavior During Therapy [facility] for tasks assessed/performed      Past Medical History:  Diagnosis Date  . Anxiety   . Degenerative disk disease   . Diverticulosis   . Hyperlipemia   . LBP (low back pain)   . Osteoarthritis   . PONV (postoperative nausea and vomiting)   . Vitamin B deficiency   . Vitamin D deficiency     Past Surgical History:  Procedure Laterality Date  . ABDOMINAL HYSTERECTOMY  2005   SUPRACERVICAL HYSTERECTOMY  . APPENDECTOMY    . BREAST BIOPSY Left 06/03/2013   Procedure: BREAST BIOPSY WITH NEEDLE LOCALIZATION;  Surgeon: [name], MD;  Location: [place];  Service: General;  Laterality: Lef also lumpectomy;  . BREAST BIOPSY Left 12/21/2016  . BREAST LUMPECTOMY WITH RADIOACTIVE SEED AND SENTINEL LYMPH NODE BIOPSY Left 01/25/2017   Procedure: LEFT BREAST LUMPECTOMY WITH RADIOACTIVE SEED AND SENTINEL LYMPH NODE BIOPSY;  Surgeon: Jovita Kussmaul, MD;  Location: [place];  Service: General;  Laterality: Left;  . BREAST SURGERY  2000   breast reduction... BREAST BIOPSY ON OCTOBER 2014  . cataract surgery Bilateral   . CERVICAL FUSION  1999 & 2009   C3-4 [facility]sner  . EYE SURGERY Left 2012   cataract  . JOINT REPLACEMENT    . SPINE SURGERY  1999&2009  . TONSILLECTOMY    . TOTAL HIP ARTHROPLASTY  (L) 2007 (R) 2011   [place]usio    There were no vitals filed for this visit.       Subjective Assessment - 03/20/17 1019    Subjective I had a follow up with my regular MD. I have Valium and I only take it for muscle spasms or if I am on a plane. I can take 2 now before infusions to help with my anxiety with infusions. My shoulder is feeling ok today. I tried the exercises.    Pertinent History 12/21/2016 biopsy of the left breast upper outer quadrant area in question showed ([id number]) and invasive lobular carcinoma, grade 1, estrogen receptor 95% positive, progesterone receptor 80% positive, with an MIB-1 of 15%, and HER-2 amplified. [name] underwent left lumpectomy and sentinel lymph node sampling. The date was 01/25/2017. 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 The single sentinel lymph node was clear. DDD, bilateral hip replacements   Patient Stated Goals to learn how to reduce my risk of swelling   Currently in Pain? No/denies   Pain Score 0-No pain                         [facility] Adult PT Treatment/Exercise - 03/20/17 0001      Lumbar Exercises: Supine   Other Supine Lumbar Exercises Meeks decompression exercises with 3 sec holds x 5 reps     Shoulder Exercises: Supine  Horizontal ABduction Strengthening;Both;10 reps;Theraband   Theraband Level (Shoulder Horizontal ABduction) Level 1 (Yellow)   External Rotation Strengthening;Both;10 reps;Theraband   Theraband Level (Shoulder External Rotation) Level 1 (Yellow)   Flexion Strengthening;Both;10 reps;Theraband   Theraband Level (Shoulder Flexion) Level 1 (Yellow)   Other Supine Exercises D2 x 10 reps bilaterally with yellow theraband     Manual Therapy   Manual Therapy Soft tissue mobilization   Soft tissue mobilization to L and R upper arm in area of tightness and tenderness                        Long Term Clinic Goals - 03/13/17 1708      CC Long Term Goal  #1   Title Pt will be able to independently verbalize  lymphedema risk reduction practices   Time 4   Period Weeks   Status New   Target Date 04/10/17     CC Long Term Goal  #2   Title Pt will be independent in a home exercise program for continued strengthening and stretching for posture   Time 4   Period Weeks   Status New   Target Date 04/10/17     CC Long Term Goal  #3   Title Pt will demonstrate left shoulder abduction strength of 4/5 to allow pt to return to prior level of function   Baseline 3/5   Time 4   Period Weeks   Status New   Target Date 04/10/17     CC Long Term Goal  #4   Title Pt will demonstrate 140 degrees of left shoulder abduction to allow pt to reach items out to sides.   Baseline 124   Time 4   Period Weeks   Status New   Target Date 04/10/17     CC Long Term Goal  #5   Title Pt will report at least a 60% improvement in pain in bilateral upper arms to allow improved function.   Time 4   Period Weeks   Status New            Plan - 03/20/17 1210    Clinical Impression Statement Instructed pt in Meeks decompression exercises today to help reduce back pain. Issued this as part of pt's home exercise program. She required less cueing today with supine scapular exercises. She has not been having as much upper arm pain and both arms were much less tender today during soft tissue mobilization. Will add core exercises next visit to help improve posture and [place] standing exercises.    Rehab Potential Good   Clinical Impairments Affecting Rehab Potential hx of radiation   PT Frequency 2x / week   PT Duration 4 weeks   PT Treatment/Interventions ADLs/Self Care Home Management;Electrical Stimulation;Iontophoresis 60m/ml Dexamethasone;Therapeutic exercise;Therapeutic activities;Patient/family education;Passive range of motion;Manual techniques   PT Next Visit Plan core strengthening- pelvic tilts, pelvic tilts with - marching, clams, heel slides etc, add [place]- assess indep with Meeks   Consulted and Agree  with Plan of Care Patient      Patient will benefit from skilled therapeutic intervention in order to improve the following deficits and impairments:  Pain, Postural dysfunction, Decreased range of motion, Decreased strength, Decreased knowledge of precautions  Visit Diagnosis: Abnormal posture  Muscle weakness (generalized)  Stiffness of left shoulder, not elsewhere classified  Chronic left shoulder pain  Stiffness of right shoulder, not elsewhere classified  Chronic right shoulder pain     Problem List  Patient Active Problem List   Diagnosis Date Noted  . Malignant neoplasm of upper-outer quadrant of left breast in female, estrogen receptor positive ([place]) 01/04/2017  . 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 Lichen sclerosus et atrophicus 06/16/2016  . Osteopenia 06/16/2016  . Radiculopathy of lumbar region 03/03/2016  . Well adult exam 09/28/2015  . Conjunctivitis 06/12/2015  . Insomnia 02/09/2015  . Osteoarthritis of both knees 02/09/2015  . Hemorrhoid 02/09/2015  . Memory impairment 10/20/2014  . Abdominal pain 10/20/2014  . B12 deficiency 11/13/2013  . Right maxillary sinusitis 10/28/2013  . Chest pain 07/17/2013  . Situational mixed anxiety and depressive disorder 10/28/2012  . Vaginal atrophy 04/26/2011  . Post-menopausal 04/26/2011  . PARESTHESIA 09/28/2010  . Obesity 03/15/2010  . HIP PAIN 03/15/2010  . ALOPECIA 12/08/2009  . SINUSITIS, ACUTE 07/20/2009  . TOBACCO USE, QUIT 06/12/2009  . INGUINAL PAIN, RIGHT 08/12/2008  . UPPER RESPIRATORY INFECTION (URI) 09/19/2007  . NECK PAIN 06/06/2007  . Vitamin D deficiency 05/02/2007  . Dyslipidemia 05/02/2007  . ANEMIA, VITAMIN B12 DEFICIENCY NEC 05/02/2007  . Anxiety state 05/02/2007  . OSTEOARTHRITIS 05/02/2007  . LOW BACK PAIN 05/02/2007    Allyson Sabal [facility] 03/20/2017, 12:13 PM  [place], [place], [address] Phone:  [phone]   Fax:  [phone]  Name: ETHELYNE ERICH MRN: [id number] Date of Birth: 1940-09-05  Manus Gunning, PT 03/20/17 12:13 PM

## 2017-03-21 ENCOUNTER — Other Ambulatory Visit: Payer: Self-pay

## 2017-03-21 DIAGNOSIS — C50412 Malignant neoplasm of upper-outer quadrant of left female breast: Secondary | ICD-10-CM

## 2017-03-21 DIAGNOSIS — Z170 Estrogen receptor positive status [ER+]: Principal | ICD-10-CM

## 2017-03-22 ENCOUNTER — Other Ambulatory Visit (HOSPITAL_BASED_OUTPATIENT_CLINIC_OR_DEPARTMENT_OTHER): Payer: Medicare Other

## 2017-03-22 ENCOUNTER — Ambulatory Visit (HOSPITAL_BASED_OUTPATIENT_CLINIC_OR_DEPARTMENT_OTHER): Payer: Medicare Other | Admitting: Oncology

## 2017-03-22 ENCOUNTER — Ambulatory Visit (HOSPITAL_BASED_OUTPATIENT_CLINIC_OR_DEPARTMENT_OTHER): Payer: Medicare Other

## 2017-03-22 VITALS — BP 160/73

## 2017-03-22 VITALS — BP 171/87 | HR 74 | Temp 97.8°F | Resp 18 | Ht 64.0 in | Wt 191.5 lb

## 2017-03-22 DIAGNOSIS — C50412 Malignant neoplasm of upper-outer quadrant of left female breast: Secondary | ICD-10-CM | POA: Diagnosis present

## 2017-03-22 DIAGNOSIS — Z170 Estrogen receptor positive status [ER+]: Secondary | ICD-10-CM | POA: Diagnosis not present

## 2017-03-22 DIAGNOSIS — Z5112 Encounter for antineoplastic immunotherapy: Secondary | ICD-10-CM | POA: Diagnosis present

## 2017-03-22 LAB — COMPREHENSIVE METABOLIC PANEL
ALT: 35 U/L (ref 0–55)
AST: 22 U/L (ref 5–34)
Albumin: 3.8 g/dL (ref 3.5–5.0)
Alkaline Phosphatase: 85 U/L (ref 40–150)
Anion Gap: 9 mEq/L (ref 3–11)
BUN: 13.4 mg/dL (ref 7.0–26.0)
CO2: 25 mEq/L (ref 22–29)
Calcium: 10.1 mg/dL (ref 8.4–10.4)
Chloride: 106 mEq/L (ref 98–109)
Creatinine: 0.8 mg/dL (ref 0.6–1.1)
EGFR: 69 mL/min/{1.73_m2} — ABNORMAL LOW (ref >90)
Glucose: 99 mg/dl (ref 70–140)
Potassium: 3.9 mEq/L (ref 3.5–5.1)
Sodium: 139 mEq/L (ref 136–145)
Total Bilirubin: 0.41 mg/dL (ref 0.20–1.20)
Total Protein: 7.8 g/dL (ref 6.4–8.3)

## 2017-03-22 LAB — CBC WITH DIFFERENTIAL/PLATELET
BASO%: 0.3 % (ref 0.0–2.0)
Basophils Absolute: 0 10*3/uL (ref 0.0–0.1)
EOS%: 2.7 % (ref 0.0–7.0)
Eosinophils Absolute: 0.2 10*3/uL (ref 0.0–0.5)
HCT: 43.8 % (ref 34.8–46.6)
HGB: 14.6 g/dL (ref 11.6–15.9)
LYMPH%: 27.8 % (ref 14.0–49.7)
MCH: 32.3 pg (ref 25.1–34.0)
MCHC: 33.3 g/dL (ref 31.5–36.0)
MCV: 96.9 fL (ref 79.5–101.0)
MONO#: 0.9 10*3/uL (ref 0.1–0.9)
MONO%: 11.6 % (ref 0.0–14.0)
NEUT#: 4.5 10*3/uL (ref 1.5–6.5)
NEUT%: 57.6 % (ref 38.4–76.8)
Platelets: 269 10*3/uL (ref 145–400)
RBC: 4.52 10*6/uL (ref 3.70–5.45)
RDW: 13.3 % (ref 11.2–14.5)
WBC: 7.8 10*3/uL (ref 3.9–10.3)
lymph#: 2.2 10*3/uL (ref 0.9–3.3)

## 2017-03-22 MED ORDER — FAMOTIDINE IN NACL 20-0.9 MG/50ML-% IV SOLN
20.0000 mg | Freq: Two times a day (BID) | INTRAVENOUS | Status: DC
  Administered 2017-03-22: 20 mg via INTRAVENOUS

## 2017-03-22 MED ORDER — SODIUM CHLORIDE 0.9 % IV SOLN: 10.0000 mg | Freq: Once | INTRAVENOUS | Status: DC

## 2017-03-22 MED ORDER — TRASTUZUMAB CHEMO 150 MG IV SOLR
550.0000 mg | Freq: Once | INTRAVENOUS | Status: AC
  Administered 2017-03-22: 550 mg via INTRAVENOUS
  Filled 2017-03-22: qty 26.19

## 2017-03-22 MED ORDER — SODIUM CHLORIDE 0.9 % IV SOLN
Freq: Once | INTRAVENOUS | Status: AC
  Administered 2017-03-22: 12:00:00 via INTRAVENOUS

## 2017-03-22 MED ORDER — DEXAMETHASONE SODIUM PHOSPHATE 10 MG/ML IJ SOLN
INTRAMUSCULAR | Status: AC
  Filled 2017-03-22: qty 1

## 2017-03-22 MED ORDER — ACETAMINOPHEN 325 MG PO TABS
ORAL_TABLET | ORAL | Status: AC
  Filled 2017-03-22: qty 2

## 2017-03-22 MED ORDER — DIPHENHYDRAMINE HCL 25 MG PO CAPS
ORAL_CAPSULE | ORAL | Status: AC
  Filled 2017-03-22: qty 1

## 2017-03-22 MED ORDER — DIPHENHYDRAMINE HCL 25 MG PO CAPS
25.0000 mg | ORAL_CAPSULE | Freq: Once | ORAL | Status: AC
  Administered 2017-03-22: 25 mg via ORAL

## 2017-03-22 MED ORDER — FAMOTIDINE IN NACL 20-0.9 MG/50ML-% IV SOLN
INTRAVENOUS | Status: AC
  Filled 2017-03-22: qty 50

## 2017-03-22 MED ORDER — ACETAMINOPHEN 325 MG PO TABS
650.0000 mg | ORAL_TABLET | Freq: Once | ORAL | Status: AC
  Administered 2017-03-22: 650 mg via ORAL

## 2017-03-22 MED ORDER — DEXAMETHASONE SODIUM PHOSPHATE 10 MG/ML IJ SOLN
10.0000 mg | Freq: Once | INTRAMUSCULAR | Status: AC
  Administered 2017-03-22: 10 mg via INTRAVENOUS

## 2017-03-22 MED ORDER — VENLAFAXINE HCL ER 75 MG PO CP24: 75.0000 mg | ORAL_CAPSULE | Freq: Every day | ORAL | 4 refills | Status: DC

## 2017-03-22 NOTE — Progress Notes (Signed)
[place]  [phone] [phone]     ID: [name] DOB: Sep 29, 1940  MR#: [id number]  VHQ#:[id number]  Patient Care Team: Cassandria Anger, MD as PCP - General Terrance Mass, MD (Obstetrics and Gynecology) Kristeen Miss, MD as Attending Physician (Neurosurgery) Jovita Kussmaul, MD as Consulting Physician (General Surgery) Rolm Bookbinder, MD as Consulting Physician (Dermatology) Bensimhon, Shaune Pascal, MD as Consulting Physician (Cardiology) Chauncey Cruel, MD OTHER MD:  CHIEF COMPLAINT: Triple positive breast cancer  CURRENT TREATMENT: Anastrozole, trastuzumab  BREAST CANCER HISTORY: From the original intake note:  The patient had bilateral screening mammography at [facility] 06/13/2016 showing a 5 mm mass in the left breast upper outer quadrant middle depth. This was felt to be most likely fat necrosis but additional imaging with ultrasonography was obtained the same day and confirmed a 0 point centimeter round oil cyst in the left breast at the 1:00 anterior depth. This was felt to be most likely benign but six--month follow-up diagnostic mammography of the left breast with tomography was recommended and performed 12/20/2016. The breast density was category A.. The oval mass in the left breast upper outer quadrant had increased in size.  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  The patient has met with my partner Dr. Burr Medico and is here today as a second opinion regarding how to proceed   INTERVAL HISTORY: [name] returns today for follow-up and treatment of her estrogen receptor positive breast cancer accompanied by her daughter. [name] is on anastrozole, generally with  good tolerance. She is having more hot flashes and she also has a mild headache most days, which is a new problem. She thinks that may be related to the anastrozole as well. The headache is easily controlled on Tylenol. Vaginal dryness is not a major issue. She obtains a drug at a good price.  She also started trastuzumab 03/01/2017. She had chills for several hours and then a mild temperature, less than 101, that evening. She took Tylenol and that resolved. She is due for repeat treatment today.  REVIEW OF SYSTEMS: [name] has had a bit of a runny nose which may be related to her headaches. She has tried what sounds like loratadine once or twice without much success. There has been no dizziness, gait imbalance, nausea or vomiting. She has had no cough phlegm production or pleurisy. She was told by a nurse that she could not use peroxide and her hair. She was told by rehabilitation that if she did any flights she would need a compression sleeve. A detailed review of systems today was otherwise stable  PAST MEDICAL HISTORY: Past Medical History:  Diagnosis Date  . Anxiety   . Degenerative disk disease   . Diverticulosis   . Hyperlipemia   . LBP (low back pain)   . Osteoarthritis   . PONV (postoperative nausea and vomiting)   . Vitamin B deficiency   . Vitamin D deficiency     PAST SURGICAL HISTORY: Past Surgical History:  Procedure Laterality Date  . ABDOMINAL HYSTERECTOMY  2005   SUPRACERVICAL HYSTERECTOMY  . APPENDECTOMY    . BREAST BIOPSY Left 06/03/2013   Procedure: BREAST BIOPSY WITH NEEDLE LOCALIZATION;  Surgeon: [name], MD;  [facility]  OR;  Service: General;  Laterality: Lef also lumpectomy;  . BREAST BIOPSY Left 12/21/2016  . BREAST LUMPECTOMY WITH RADIOACTIVE SEED AND SENTINEL LYMPH NODE BIOPSY Left 01/25/2017   Procedure: LEFT BREAST LUMPECTOMY WITH RADIOACTIVE SEED AND SENTINEL LYMPH NODE BIOPSY;  Surgeon: Jovita Kussmaul, MD;  Location: [place];   Service: General;  Laterality: Left;  . BREAST SURGERY  2000   breast reduction... BREAST BIOPSY ON OCTOBER 2014  . cataract surgery Bilateral   . CERVICAL FUSION  1999 & 2009   C3-4 [place]  . EYE SURGERY Left 2012   cataract  . JOINT REPLACEMENT    . SPINE SURGERY  1999&2009  . TONSILLECTOMY    . TOTAL HIP ARTHROPLASTY  (L) 2007 (R) 2011   [place]    FAMILY HISTORY Family History  Problem Relation Age of Onset  . Arthritis Mother 6       polymyositis  . Hypertension Father   . Heart disease Father   . Hypertension Brother   . Cancer Brother        Oral  She does not meet criteria for genetics testing   GYNECOLOGIC HISTORY:  No LMP recorded. Patient has had a hysterectomy. Menarche age 48, first live birth age 57, she is Winter P1. She stopped having periods sometime in her early 57s. She did not use hormone replacement but did use some vaginal cream at times after menopause. 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 Her first marriage lasted only 2 years. She had no children from that marriage. Her second husband died from an aneurysm or than 30 years ago. That second marriage produced her son Percell Miller, 88 years old as of June 2018. He works for a [employer]. He is recently separated from his wife in [place] and is living with the patient temporarily. The patient does have 2 grandchildren. She attends [employer]    ADVANCED DIRECTIVES: Not in place. At the 01/11/2017 visit the patient tells me she intends to name her son as her healthcare part of attorney   HEALTH MAINTENANCE: Social History  Substance Use Topics  . Smoking status: Former Smoker    Packs/day: 3.00    Years: 20.00    Quit date: 04/19/1985  . Smokeless tobacco: Never Used  . Alcohol use Yes     Comment: occasionally      Colonoscopy:November 2008  PAP:  Bone density: 07/16/2015 showed  osteopenia   Allergies  Allergen Reactions  . Augmentin [Amoxicillin-Pot Clavulanate] Diarrhea    diarrhea  . Cymbalta [Duloxetine Hcl] Diarrhea    diarrhea  . Ezetimibe-Simvastatin Other (See Comments)  . Phentermine Hcl Other (See Comments)    Current Outpatient Prescriptions  Medication Sig Dispense Refill  . Acetaminophen (TYLENOL ARTHRITIS PAIN PO) Take by mouth.    Marland Kitchen anastrozole (ARIMIDEX) 1 MG tablet Take 1 tablet (1 mg total) by mouth daily. 90 tablet 3  . Ascorbic Acid (VITAMIN C PO) Take by mouth daily.    . Biotin 1000 MCG tablet Take 2,000 mcg by mouth 3 (three) times daily.    . Cholecalciferol (VITAMIN D3) 1000 UNITS tablet Take 2,000 Units by mouth daily.      . diazepam (VALIUM) 5 MG tablet TAKE 1 TABLET TWICE A DAY AS NEEDED FOR ANXIETY OR MUSCLE SPASMS 60 tablet 1  . HYDROcodone-acetaminophen (NORCO/VICODIN) 5-325 MG  tablet Take 1 tablet by mouth every 6 (six) hours as needed for moderate pain.    . Melatonin 10 MG TABS Take by mouth.    . Omega-3 Fatty Acids (FISH OIL) 1000 MG CAPS Take by mouth.    . venlafaxine XR (EFFEXOR-XR) 75 MG 24 hr capsule Take 1 capsule (75 mg total) by mouth daily with breakfast. 90 capsule 4  . vitamin B-12 (CYANOCOBALAMIN) 1000 MCG tablet Take 1,000 mcg by mouth daily.      . vitamin E 1000 UNIT capsule Take 1,000 Units by mouth daily.     No current facility-administered medications for this visit.     OBJECTIVE: Older white woman In no acute distress  Vitals:   03/22/17 1030  BP: (!) 171/87  Pulse: 74  Resp: 18  Temp: 97.8 F (36.6 C)  SpO2: 98%     Body mass index is 32.87 kg/m.    ECOG FS:1 - Symptomatic but completely ambulatory  Sclerae unicteric, pupils round and equal Oropharynx clear and moist No cervical or supraclavicular adenopathy Lungs no rales or rhonchi Heart regular rate and rhythm Abd soft, nontender, positive bowel sounds MSK no focal spinal tenderness, no upper extremity lymphedema Neuro: nonfocal,  well oriented, appropriate affect Breasts: The right breast is benign. The left breast is undergone lumpectomy and sentinel lymph node sampling. There is no evidence of local recurrence. Both axillae are benign.  LAB RESULTS:  CMP     Component Value Date/Time   NA 139 03/22/2017 1010   K 3.9 03/22/2017 1010   CL 105 10/20/2014 1405   CO2 25 03/22/2017 1010   GLUCOSE 99 03/22/2017 1010   GLUCOSE 99 05/18/2006 1500   BUN 13.4 03/22/2017 1010   CREATININE 0.8 03/22/2017 1010   CALCIUM 10.1 03/22/2017 1010   PROT 7.8 03/22/2017 1010   ALBUMIN 3.8 03/22/2017 1010   AST 22 03/22/2017 1010   ALT 35 03/22/2017 1010   ALKPHOS 85 03/22/2017 1010   BILITOT 0.41 03/22/2017 1010   GFRNONAA 86 (L) 05/24/2013 1304   GFRAA >90 05/24/2013 1304    No results found for: TOTALPROTELP, ALBUMINELP, A1GS, A2GS, BETS, BETA2SER, GAMS, MSPIKE, SPEI  No results found for: Nils Pyle, [facility]  Lab Results  Component Value Date   WBC 7.8 03/22/2017   NEUTROABS 4.5 03/22/2017   HGB 14.6 03/22/2017   HCT 43.8 03/22/2017   MCV 96.9 03/22/2017   PLT 269 03/22/2017      Chemistry      Component Value Date/Time   NA 139 03/22/2017 1010   K 3.9 03/22/2017 1010   CL 105 10/20/2014 1405   CO2 25 03/22/2017 1010   BUN 13.4 03/22/2017 1010   CREATININE 0.8 03/22/2017 1010      Component Value Date/Time   CALCIUM 10.1 03/22/2017 1010   ALKPHOS 85 03/22/2017 1010   AST 22 03/22/2017 1010   ALT 35 03/22/2017 1010   BILITOT 0.41 03/22/2017 1010       No results found for: LABCA2  No components found for: [id number]  No results for input(s): INR in the last 168 hours.  Urinalysis    Component Value Date/Time   COLORURINE YELLOW 10/20/2014 1405   APPEARANCEUR CLEAR 10/20/2014 1405   LABSPEC >=1.030 (A) 10/20/2014 1405   PHURINE 5.5 10/20/2014 1405   GLUCOSEU NEGATIVE 10/20/2014 1405   HGBUR NEGATIVE 10/20/2014 1405   BILIRUBINUR SMALL (A) 10/20/2014 1405   KETONESUR  TRACE (A) 10/20/2014 1405   PROTEINUR NEGATIVE 08/24/2009 0900  UROBILINOGEN 0.2 10/20/2014 1405   NITRITE NEGATIVE 10/20/2014 1405   LEUKOCYTESUR NEGATIVE 10/20/2014 1405     STUDIES: Echocardiogram results again reviewed ELIGIBLE FOR AVAILABLE RESEARCH PROTOCOL: No  ASSESSMENT: 76 y.o. [place] woman status post left breast upper outer quadrant biopsy 12/21/2016 for a invasive lobular carcinoma, grade 1, estrogen and progesterone receptor positive, with an MIB-1 of 15%, and HER-2 amplified (triple positive).  (1) left lumpectomy and sentinel lymph node sampling 01/25/2017 showed a pT1b pN0, stage IA invasive lobular breast cancer, grade 2, with negative margins  (2) anti-HER-2 immunotherapy to consist of trastuzumab for 6 months starting 03/01/2017  (a) echo 02/21/2017 showed an ejection fraction of 60-65%  (3) adjuvant radiation omitted as per the multidisciplinary clinic discussion 02/15/2017   (4) anastrozole started 02/15/2017  PLAN: I spent approximately 30 minutes with [name] with most of that time spent discussing her questions and concerns. She did have a bothersome but ultimately minor reaction to the first dose of trastuzumab. I do not anticipate that she will have a similar reaction with subsequent doses but just to make sure this one time I am adding additional premeds.  I am also scheduling her to see my nurse practitioner with the third cycle, 3 weeks from now, just to make sure there are no other issues.  I reassured her that she is not receiving chemotherapy and therefore if she wants to use peroxide on her hair or receive a flu shot there is no problem with that. I also reassured her that she does not need a compression sleeve for flights less than 6 hours. In fact I encouraged her to consider herself normal.  She is having some hot flashes related to the anastrozole. I am starting her on venlafaxine at 75 mg daily. We did discuss the possible toxicities side effects  and complications of this agent today which however at half dose is generally very well tolerated. She will let me know if that causes her any problems.  Otherwise she will see me again in October 24. She will have a repeat echocardiogram just before that visit.  She knows to call for any other problems that may develop before then.  Chauncey Cruel, MD   03/22/2017 11:04 AM Medical Oncology and Hematology [facility] [address] [place], [place] [address] Tel. [phone]    Fax. [phone]

## 2017-03-22 NOTE — Patient Instructions (Signed)
[place] Discharge Instructions for Patients Receiving Chemotherapy  Today you received the following chemotherapy agents:  Herceptin, (trastuzumab)  To help prevent nausea and vomiting after your treatment, we encourage you to take your nausea medication as prescribed.    If you develop nausea and vomiting that is not controlled by your nausea medication, call the clinic.   BELOW ARE SYMPTOMS THAT SHOULD BE REPORTED IMMEDIATELY:  *FEVER GREATER THAN 100.5 F  *CHILLS WITH OR WITHOUT FEVER  NAUSEA AND VOMITING THAT IS NOT CONTROLLED WITH YOUR NAUSEA MEDICATION  *UNUSUAL SHORTNESS OF BREATH  *UNUSUAL BRUISING OR BLEEDING  TENDERNESS IN MOUTH AND THROAT WITH OR WITHOUT PRESENCE OF ULCERS  *URINARY PROBLEMS  *BOWEL PROBLEMS  UNUSUAL RASH Items with * indicate a potential emergency and should be followed up as soon as possible.  Feel free to call the clinic you have any questions or concerns. The clinic phone number is [phone].  Please show the [place] at check-in to the Emergency Department and triage nurse.     Trastuzumab injection for infusion What is this medicine? TRASTUZUMAB (tras TOO zoo mab) is a monoclonal antibody. It is used to treat breast cancer and stomach cancer. This medicine may be used for other purposes; ask your health care provider or pharmacist if you have questions. COMMON BRAND NAME(S): Herceptin What should I tell my health care provider before I take this medicine? They need to know if you have any of these conditions: -heart disease -heart failure -lung or breathing disease, like asthma -an unusual or allergic reaction to trastuzumab, benzyl alcohol, or other medications, foods, dyes, or preservatives -pregnant or trying to get pregnant -breast-feeding How should I use this medicine? This drug is given as an infusion into a vein. It is administered in a hospital or clinic by a specially trained health care  professional. Talk to your pediatrician regarding the use of this medicine in children. This medicine is not approved for use in children. Overdosage: If you think you have taken too much of this medicine contact a poison control center or emergency room at once. NOTE: This medicine is only for you. Do not share this medicine with others. What if I miss a dose? It is important not to miss a dose. Call your doctor or health care professional if you are unable to keep an appointment. What may interact with this medicine? This medicine may interact with the following medications: -certain types of chemotherapy, such as daunorubicin, doxorubicin, epirubicin, and idarubicin This list may not describe all possible interactions. Give your health care provider a list of all the medicines, herbs, non-prescription drugs, or dietary supplements you use. Also tell them if you smoke, drink alcohol, or use illegal drugs. Some items may interact with your medicine. What should I watch for while using this medicine? Visit your doctor for checks on your progress. Report any side effects. Continue your course of treatment even though you feel ill unless your doctor tells you to stop. Call your doctor or health care professional for advice if you get a fever, chills or sore throat, or other symptoms of a cold or flu. Do not treat yourself. Try to avoid being around people who are sick. You may experience fever, chills and shaking during your first infusion. These effects are usually mild and can be treated with other medicines. Report any side effects during the infusion to your health care professional. Fever and chills usually do not happen with later infusions.  Do not become pregnant while taking this medicine or for 7 months after stopping it. Women should inform their doctor if they wish to become pregnant or think they might be pregnant. Women of child-bearing potential will need to have a negative pregnancy test  before starting this medicine. There is a potential for serious side effects to an unborn child. Talk to your health care professional or pharmacist for more information. Do not breast-feed an infant while taking this medicine or for 7 months after stopping it. Women must use effective birth control with this medicine. What side effects may I notice from receiving this medicine? Side effects that you should report to your doctor or health care professional as soon as possible: -allergic reactions like skin rash, itching or hives, swelling of the face, lips, or tongue -chest pain or palpitations -cough -dizziness -feeling faint or lightheaded, falls -fever -general ill feeling or flu-like symptoms -signs of worsening heart failure like breathing problems; swelling in your legs and feet -unusually weak or tired Side effects that usually do not require medical attention (report to your doctor or health care professional if they continue or are bothersome): -bone pain -changes in taste -diarrhea -joint pain -nausea/vomiting -weight loss This list may not describe all possible side effects. Call your doctor for medical advice about side effects. You may report side effects to FDA at 1-800-FDA-1088. Where should I keep my medicine? This drug is given in a hospital or clinic and will not be stored at home. NOTE: This sheet is a summary. It may not cover all possible information. If you have questions about this medicine, talk to your doctor, pharmacist, or health care provider.  2018 Elsevier/Gold Standard (2016-07-12 14:37:52)

## 2017-03-24 ENCOUNTER — Encounter: Payer: Self-pay | Admitting: Physical Therapy

## 2017-03-24 ENCOUNTER — Ambulatory Visit: Payer: Medicare Other | Admitting: Physical Therapy

## 2017-03-24 DIAGNOSIS — M25511 Pain in right shoulder: Secondary | ICD-10-CM

## 2017-03-24 DIAGNOSIS — R293 Abnormal posture: Secondary | ICD-10-CM | POA: Diagnosis not present

## 2017-03-24 DIAGNOSIS — M25612 Stiffness of left shoulder, not elsewhere classified: Secondary | ICD-10-CM

## 2017-03-24 DIAGNOSIS — M6281 Muscle weakness (generalized): Secondary | ICD-10-CM

## 2017-03-24 DIAGNOSIS — M25512 Pain in left shoulder: Secondary | ICD-10-CM

## 2017-03-24 DIAGNOSIS — M25611 Stiffness of right shoulder, not elsewhere classified: Secondary | ICD-10-CM

## 2017-03-24 DIAGNOSIS — G8929 Other chronic pain: Secondary | ICD-10-CM

## 2017-03-24 NOTE — Therapy (Signed)
[place], [place], [address] Phone: [phone]   Fax:  [phone]  Physical Therapy Treatment  Patient Details  Name: [name] MRN: [id number] Date of Birth: [birth year] Referring Provider: Magrinat  Encounter Date: 03/24/2017      PT End of Session - 03/24/17 1151    Visit Number 5   Number of Visits 9   Date for PT Re-Evaluation 04/10/17   PT Start Time 1104   PT Stop Time 1145   PT Time Calculation (min) 41 min   Activity Tolerance Patient tolerated treatment well   Behavior During Therapy [facility] for tasks assessed/performed      Past Medical History:  Diagnosis Date  . Anxiety   . Degenerative disk disease   . Diverticulosis   . Hyperlipemia   . LBP (low back pain)   . Osteoarthritis   . PONV (postoperative nausea and vomiting)   . Vitamin B deficiency   . Vitamin D deficiency     Past Surgical History:  Procedure Laterality Date  . ABDOMINAL HYSTERECTOMY  2005   SUPRACERVICAL HYSTERECTOMY  . APPENDECTOMY    . BREAST BIOPSY Left 06/03/2013   Procedure: BREAST BIOPSY WITH NEEDLE LOCALIZATION;  Surgeon: [name], MD;  Location: [place];  Service: General;  Laterality: Lef also lumpectomy;  . BREAST BIOPSY Left 12/21/2016  . BREAST LUMPECTOMY WITH RADIOACTIVE SEED AND SENTINEL LYMPH NODE BIOPSY Left 01/25/2017   Procedure: LEFT BREAST LUMPECTOMY WITH RADIOACTIVE SEED AND SENTINEL LYMPH NODE BIOPSY;  Surgeon: Jovita Kussmaul, MD;  Location: [place];  Service: General;  Laterality: Left;  . BREAST SURGERY  2000   breast reduction... BREAST BIOPSY ON OCTOBER 2014  . cataract surgery Bilateral   . CERVICAL FUSION  1999 & 2009   C3-4 [facility]ner  . EYE SURGERY Left 2012   cataract  . JOINT REPLACEMENT    . SPINE SURGERY  1999&2009  . TONSILLECTOMY    . TOTAL HIP ARTHROPLASTY  (L) 2007 (R) 2011   [place]usio    There were no vitals filed for this visit.       Subjective Assessment - 03/24/17 1108    Subjective I feel slightly off inside. I think it may be the infusion. My shoulders feel good.    Pertinent History 12/21/2016 biopsy of the left breast upper outer quadrant area in question showed ([id number]) and invasive lobular carcinoma, grade 1, estrogen receptor 95% positive, progesterone receptor 80% positive, with an MIB-1 of 15%, and HER-2 amplified. Estreya underwent left lumpectomy and sentinel lymph node sampling. The date was 01/25/2017. 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 The single sentinel lymph node was clear. DDD, bilateral hip replacements   Patient Stated Goals to learn how to reduce my risk of swelling   Currently in Pain? No/denies   Pain Score 0-No pain            [facility] PT Assessment - 03/24/17 0001      AROM   Left Shoulder ABduction 132 Degrees                     [facility] Adult PT Treatment/Exercise - 03/24/17 0001      Lumbar Exercises: Supine   Bridge 10 reps;3 seconds  after 3 reps performed passive hamstring stretches for Pain   Other Supine Lumbar Exercises Meeks decompression exercises with 3 sec holds x 5 reps  Other Supine Lumbar Exercises pelvic tilts with 5 sec holds x 10 reps, pelvic tilts with marches x 10 reps, pelvic tilts with 10 clams, pelvic tilts with heel slides x 10     Shoulder Exercises: Standing   External Rotation Both;10 reps;Theraband;Strengthening   Theraband Level (Shoulder External Rotation) Level 1 (Yellow)   Internal Rotation Strengthening;Both;10 reps;Theraband   Theraband Level (Shoulder Internal Rotation) Level 1 (Yellow)   Flexion Strengthening;Both;10 reps;Theraband   Theraband Level (Shoulder Flexion) Level 1 (Yellow)   Extension Strengthening;Both;10 reps;Theraband   Theraband Level (Shoulder Extension) Level 1 (Yellow)                        Long Term Clinic Goals - 03/24/17 1108       CC Long Term Goal  #1   Title Pt will be able to independently verbalize lymphedema risk reduction practices   Baseline pt able to independently verbalize   Time 4   Period Weeks   Status Achieved     CC Long Term Goal  #2   Title Pt will be independent in a home exercise program for continued strengthening and stretching for posture   Time 4   Period Weeks   Status On-going     CC Long Term Goal  #3   Title Pt will demonstrate left shoulder abduction strength of 4/5 to allow pt to return to prior level of function   Baseline 3+/5   Time 4   Period Weeks   Status On-going     CC Long Term Goal  #4   Title Pt will demonstrate 140 degrees of left shoulder abduction to allow pt to reach items out to sides.   Baseline 124, 03/24/17- 132 degrees    Time 4   Period Weeks   Status On-going     CC Long Term Goal  #5   Title Pt will report at least a 60% improvement in pain in bilateral upper arms to allow improved function.   Baseline 03/24/17- 50% improvement   Time 4   Period Weeks   Status On-going            Plan - 03/24/17 1151    Clinical Impression Statement Assessed pt's progress towards goals. Her ROM and her pain is decreasing. Reviewed Meeks decompression exercises and pt was able to demonstrate independence with these. Issued rockwood strengthening exercises today with yellow band. Also instructed pt in core strengthening exercises to help decrease back pain and improve posture. When pt when to lie down and sit up she was educated to tighten her abdominal muscles and when she did she was able to do so without pain.    Rehab Potential Good   Clinical Impairments Affecting Rehab Potential hx of radiation   PT Frequency 2x / week   PT Duration 4 weeks   PT Treatment/Interventions ADLs/Self Care Home Management;Electrical Stimulation;Iontophoresis 46m/ml Dexamethasone;Therapeutic exercise;Therapeutic activities;Patient/family education;Passive range of motion;Manual  techniques   PT Next Visit Plan core strengthening- pelvic tilts, pelvic tilts with - marching, clams, heel slides, assess indep with rockwood - strength ABC?   Consulted and Agree with Plan of Care Patient      Patient will benefit from skilled therapeutic intervention in order to improve the following deficits and impairments:  Pain, Postural dysfunction, Decreased range of motion, Decreased strength, Decreased knowledge of precautions  Visit Diagnosis: Abnormal posture  Muscle weakness (generalized)  Stiffness of left shoulder, not elsewhere classified  Chronic left  shoulder pain  Stiffness of right shoulder, not elsewhere classified  Chronic right shoulder pain     Problem List Patient Active Problem List   Diagnosis Date Noted  . Malignant neoplasm of upper-outer quadrant of left breast in female, estrogen receptor positive ([place]) 01/04/2017  . 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 Lichen sclerosus et atrophicus 06/16/2016  . Osteopenia 06/16/2016  . Radiculopathy of lumbar region 03/03/2016  . Well adult exam 09/28/2015  . Conjunctivitis 06/12/2015  . Insomnia 02/09/2015  . Osteoarthritis of both knees 02/09/2015  . Hemorrhoid 02/09/2015  . Memory impairment 10/20/2014  . Abdominal pain 10/20/2014  . B12 deficiency 11/13/2013  . Right maxillary sinusitis 10/28/2013  . Chest pain 07/17/2013  . Situational mixed anxiety and depressive disorder 10/28/2012  . Vaginal atrophy 04/26/2011  . Post-menopausal 04/26/2011  . PARESTHESIA 09/28/2010  . Obesity 03/15/2010  . HIP PAIN 03/15/2010  . ALOPECIA 12/08/2009  . SINUSITIS, ACUTE 07/20/2009  . TOBACCO USE, QUIT 06/12/2009  . INGUINAL PAIN, RIGHT 08/12/2008  . UPPER RESPIRATORY INFECTION (URI) 09/19/2007  . NECK PAIN 06/06/2007  . Vitamin D deficiency 05/02/2007  . Dyslipidemia 05/02/2007  . ANEMIA, VITAMIN B12 DEFICIENCY NEC 05/02/2007  . Anxiety state 05/02/2007  . OSTEOARTHRITIS 05/02/2007  . LOW BACK PAIN  05/02/2007    Allyson Sabal [facility] 03/24/2017, 11:54 AM  [place], [place], [address] Phone: [phone]   Fax:  [phone]  Name: BRIDGITT RAGGIO MRN: [id number] Date of Birth: 24-May-1941  Manus Gunning, PT 03/24/17 11:56 AM

## 2017-03-24 NOTE — Patient Instructions (Signed)
Strengthening: Resisted Flexion    Cancer Rehab [phone]    Hold tubing with left arm at side. Pull forward and up. Move shoulder through pain-free range of motion. Repeat _10___ times per set. Do _1-2___ sessions per day.  Strengthening: Resisted Internal Rotation    Hold tubing in left hand, elbow at side and forearm out. Rotate forearm in across body. Repeat _10___ times per set. Do _1-2___ sessions per day.  Strengthening: Resisted Extension    Hold tubing in left hand, arm forward. Pull arm back, elbow straight. Repeat __10__ times per set. Do __1-2__ sessions per day.   Strengthening: Resisted External Rotation    Hold tubing in left hand, elbow at side and forearm across body. Rotate forearm out. Repeat _10___ times per set. Do __1-2__ sessions per day.   ' ** Repeat all on right arm **

## 2017-03-27 ENCOUNTER — Encounter: Payer: Medicare Other | Admitting: Physical Therapy

## 2017-03-27 ENCOUNTER — Telehealth: Payer: Self-pay | Admitting: *Deleted

## 2017-03-27 NOTE — Telephone Encounter (Signed)
Pt called back. She has been off effexor for 2 days and feels much better and more like herself. She states she would rather have hot flashes than the SE from the effexor. She does not want another medication unless it is something OTC.

## 2017-03-27 NOTE — Telephone Encounter (Signed)
This RN received communication from [place] stating pt called on 03/25/2017.  Per communication - states " tired, light headed, vision can't focus, was just put on med by dr., taken 3 doses, symptoms started, stopping the med wants drs nurse to call her back "  Return call number given as [phone].  This RN returned call upon receiving message and obtained VM.  Message left requesting pt to call for further communication.  Of note pt also received herceptin cycle 2 on 03/22/2017.

## 2017-03-29 ENCOUNTER — Encounter: Payer: Self-pay | Admitting: Physical Therapy

## 2017-03-29 ENCOUNTER — Ambulatory Visit: Payer: Medicare Other | Admitting: Physical Therapy

## 2017-03-29 DIAGNOSIS — M25611 Stiffness of right shoulder, not elsewhere classified: Secondary | ICD-10-CM

## 2017-03-29 DIAGNOSIS — R293 Abnormal posture: Secondary | ICD-10-CM | POA: Diagnosis not present

## 2017-03-29 DIAGNOSIS — M6281 Muscle weakness (generalized): Secondary | ICD-10-CM

## 2017-03-29 DIAGNOSIS — G8929 Other chronic pain: Secondary | ICD-10-CM

## 2017-03-29 DIAGNOSIS — M25511 Pain in right shoulder: Secondary | ICD-10-CM

## 2017-03-29 DIAGNOSIS — M25512 Pain in left shoulder: Secondary | ICD-10-CM

## 2017-03-29 DIAGNOSIS — M25612 Stiffness of left shoulder, not elsewhere classified: Secondary | ICD-10-CM

## 2017-03-29 NOTE — Patient Instructions (Signed)
(  Home) Flexion: Pelvic Tilt    Lie with neck supported, knees bent, feet flat. Tighten stomach muscles by pulling belly button towards spine, pushing low back down against surface. Do not push down with legs. Repeat _10___ times per set. Hold for 5 seconds. Do __1__ sets per session. Do __7__ sessions per week.  Then do pelvic tilt with the following:  10 marches on each leg alternating between left and right  10 clam shells- bringing knees apart and back together  10 straight leg raises- one leg bent and foot flat, the other leg will be straight, raise the straight leg up while maintaining pelvic tilt- do 10x on each leg   Bridging    Slowly raise buttocks from floor, keeping stomach tight. Repeat _10___ times per set. Hold 3 seconds. Do ___1_ sets per session. Do _7___ sessions per day.  http://orth.exer.us/1097   Copyright  VHI. All rights reserved.    Copyright  VHI. All rights reserved.

## 2017-03-29 NOTE — Therapy (Signed)
[place], [place], [address] Phone: [phone]   Fax:  [phone]  Physical Therapy Treatment  Patient Details  Name: [name] MRN: [id number] Date of Birth: 1940/09/12 Referring Provider: Magrinat  Encounter Date: 03/29/2017      PT End of Session - 03/29/17 1355    Visit Number 6   Number of Visits 9   Date for PT Re-Evaluation 04/10/17   PT Start Time 6644   PT Stop Time 1350   PT Time Calculation (min) 44 min   Activity Tolerance Patient tolerated treatment well   Behavior During Therapy [facility] for tasks assessed/performed      Past Medical History:  Diagnosis Date  . Anxiety   . Degenerative disk disease   . Diverticulosis   . Hyperlipemia   . LBP (low back pain)   . Osteoarthritis   . PONV (postoperative nausea and vomiting)   . Vitamin B deficiency   . Vitamin D deficiency     Past Surgical History:  Procedure Laterality Date  . ABDOMINAL HYSTERECTOMY  2005   SUPRACERVICAL HYSTERECTOMY  . APPENDECTOMY    . BREAST BIOPSY Left 06/03/2013   Procedure: BREAST BIOPSY WITH NEEDLE LOCALIZATION;  Surgeon: [name], MD;  Location: [place];  Service: General;  Laterality: Lef also lumpectomy;  . BREAST BIOPSY Left 12/21/2016  . BREAST LUMPECTOMY WITH RADIOACTIVE SEED AND SENTINEL LYMPH NODE BIOPSY Left 01/25/2017   Procedure: LEFT BREAST LUMPECTOMY WITH RADIOACTIVE SEED AND SENTINEL LYMPH NODE BIOPSY;  Surgeon: Jovita Kussmaul, MD;  Location: [place];  Service: General;  Laterality: Left;  . BREAST SURGERY  2000   breast reduction... BREAST BIOPSY ON OCTOBER 2014  . cataract surgery Bilateral   . CERVICAL FUSION  1999 & 2009   C3-4 [facility]ner  . EYE SURGERY Left 2012   cataract  . JOINT REPLACEMENT    . SPINE SURGERY  1999&2009  . TONSILLECTOMY    . TOTAL HIP ARTHROPLASTY  (L) 2007 (R) 2011   [place]usio    There were no vitals filed for this visit.       Subjective Assessment - 03/29/17 1309    Subjective When I left here I just felt bad. I felt very dizzy when I got to the car and it was that new medicine that they started me on. I couldn't get out of bed all weekend. I had to stop to taking it because I felt so bad. I did all the exercises in the bed but I didn't get up and tie the band to the door knob.    Pertinent History 12/21/2016 biopsy of the left breast upper outer quadrant area in question showed ([id number]) and invasive lobular carcinoma, grade 1, estrogen receptor 95% positive, progesterone receptor 80% positive, with an MIB-1 of 15%, and HER-2 amplified. Haruye underwent left lumpectomy and sentinel lymph node sampling. The date was 01/25/2017. 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 The single sentinel lymph node was clear. DDD, bilateral hip replacements   Patient Stated Goals to learn how to reduce my risk of swelling   Currently in Pain? No/denies   Pain Score 0-No pain                         [facility] Adult PT Treatment/Exercise - 03/29/17 0001      Lumbar Exercises: Supine   Bridge 10  reps;3 seconds   Other Supine Lumbar Exercises pelvic tilts with 5 sec holds x 10 reps, pelvic tilts with marches x 10 reps, pelvic tilts with 10 clams, pelvic tilts with heel slides x 10     Shoulder Exercises: Supine   Horizontal ABduction Strengthening;Both;10 reps;Theraband   Theraband Level (Shoulder Horizontal ABduction) Level 1 (Yellow)   External Rotation Strengthening;Both;10 reps;Theraband   Theraband Level (Shoulder External Rotation) Level 1 (Yellow)   Flexion Strengthening;Both;10 reps;Theraband   Theraband Level (Shoulder Flexion) Level 1 (Yellow)   Other Supine Exercises D2 x 10 reps bilaterally with yellow theraband     Shoulder Exercises: Standing   External Rotation Both;10 reps;Theraband;Strengthening   Theraband Level (Shoulder External  Rotation) Level 1 (Yellow)   Internal Rotation Strengthening;Both;10 reps;Theraband   Theraband Level (Shoulder Internal Rotation) Level 1 (Yellow)   Flexion Strengthening;Both;10 reps;Theraband   Theraband Level (Shoulder Flexion) Level 1 (Yellow)   Extension Strengthening;Both;10 reps;Theraband   Theraband Level (Shoulder Extension) Level 1 (Yellow)                        Long Term Clinic Goals - 03/24/17 1108      CC Long Term Goal  #1   Title Pt will be able to independently verbalize lymphedema risk reduction practices   Baseline pt able to independently verbalize   Time 4   Period Weeks   Status Achieved     CC Long Term Goal  #2   Title Pt will be independent in a home exercise program for continued strengthening and stretching for posture   Time 4   Period Weeks   Status On-going     CC Long Term Goal  #3   Title Pt will demonstrate left shoulder abduction strength of 4/5 to allow pt to return to prior level of function   Baseline 3+/5   Time 4   Period Weeks   Status On-going     CC Long Term Goal  #4   Title Pt will demonstrate 140 degrees of left shoulder abduction to allow pt to reach items out to sides.   Baseline 124, 03/24/17- 132 degrees    Time 4   Period Weeks   Status On-going     CC Long Term Goal  #5   Title Pt will report at least a 60% improvement in pain in bilateral upper arms to allow improved function.   Baseline 03/24/17- 50% improvement   Time 4   Period Weeks   Status On-going            Plan - 03/29/17 1356    Clinical Impression Statement Pt did not feel well over the weekend due to a medication change. She has not been doing the exercises in standing but has been doing the supine exercises. Added core exercises and dynamic supine core exercises to pt's home exericse program.    Rehab Potential Good   Clinical Impairments Affecting Rehab Potential hx of radiation   PT Frequency 2x / week   PT Duration 4 weeks   PT  Treatment/Interventions ADLs/Self Care Home Management;Electrical Stimulation;Iontophoresis 35m/ml Dexamethasone;Therapeutic exercise;Therapeutic activities;Patient/family education;Passive range of motion;Manual techniques   PT Next Visit Plan assess indep with core strengthening- pelvic tilts, pelvic tilts with - marching, clams, SLR, give Strength ABC today   Consulted and Agree with Plan of Care Patient      Patient will benefit from skilled therapeutic intervention in order to improve the following deficits and impairments:  Pain, Postural dysfunction, Decreased range of motion, Decreased strength, Decreased knowledge of precautions  Visit Diagnosis: Abnormal posture  Muscle weakness (generalized)  Chronic left shoulder pain  Stiffness of left shoulder, not elsewhere classified  Stiffness of right shoulder, not elsewhere classified  Chronic right shoulder pain     Problem List Patient Active Problem List   Diagnosis Date Noted  . Malignant neoplasm of upper-outer quadrant of left breast in female, estrogen receptor positive ([place]) 01/04/2017  . 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 Lichen sclerosus et atrophicus 06/16/2016  . Osteopenia 06/16/2016  . Radiculopathy of lumbar region 03/03/2016  . Well adult exam 09/28/2015  . Conjunctivitis 06/12/2015  . Insomnia 02/09/2015  . Osteoarthritis of both knees 02/09/2015  . Hemorrhoid 02/09/2015  . Memory impairment 10/20/2014  . Abdominal pain 10/20/2014  . B12 deficiency 11/13/2013  . Right maxillary sinusitis 10/28/2013  . Chest pain 07/17/2013  . Situational mixed anxiety and depressive disorder 10/28/2012  . Vaginal atrophy 04/26/2011  . Post-menopausal 04/26/2011  . PARESTHESIA 09/28/2010  . Obesity 03/15/2010  . HIP PAIN 03/15/2010  . ALOPECIA 12/08/2009  . SINUSITIS, ACUTE 07/20/2009  . TOBACCO USE, QUIT 06/12/2009  . INGUINAL PAIN, RIGHT 08/12/2008  . UPPER RESPIRATORY INFECTION (URI) 09/19/2007  . NECK  PAIN 06/06/2007  . Vitamin D deficiency 05/02/2007  . Dyslipidemia 05/02/2007  . ANEMIA, VITAMIN B12 DEFICIENCY NEC 05/02/2007  . Anxiety state 05/02/2007  . OSTEOARTHRITIS 05/02/2007  . LOW BACK PAIN 05/02/2007    Allyson Sabal [facility] 03/29/2017, 1:58 PM  [place], [place], [address] Phone: [phone]   Fax:  [phone]  Name: JEN BENEDICT MRN: [id number] Date of Birth: [birth year]  Manus Gunning, PT 03/29/17 1:58 PM

## 2017-04-04 ENCOUNTER — Ambulatory Visit: Payer: Medicare Other | Attending: Oncology | Admitting: Physical Therapy

## 2017-04-04 DIAGNOSIS — M25611 Stiffness of right shoulder, not elsewhere classified: Secondary | ICD-10-CM | POA: Diagnosis present

## 2017-04-04 DIAGNOSIS — M25511 Pain in right shoulder: Secondary | ICD-10-CM | POA: Insufficient documentation

## 2017-04-04 DIAGNOSIS — R293 Abnormal posture: Secondary | ICD-10-CM | POA: Insufficient documentation

## 2017-04-04 DIAGNOSIS — M25612 Stiffness of left shoulder, not elsewhere classified: Secondary | ICD-10-CM

## 2017-04-04 DIAGNOSIS — M25512 Pain in left shoulder: Secondary | ICD-10-CM | POA: Diagnosis present

## 2017-04-04 DIAGNOSIS — M6281 Muscle weakness (generalized): Secondary | ICD-10-CM | POA: Insufficient documentation

## 2017-04-04 DIAGNOSIS — G8929 Other chronic pain: Secondary | ICD-10-CM

## 2017-04-04 NOTE — Therapy (Signed)
[facility] [address] [place], [place], [address] Phone: [phone]   Fax:  [phone]  Physical Therapy Treatment  Patient Details  Name: [name] MRN: [id number] Date of Birth: 02-23-1941 Referring Provider: Magrinat  Encounter Date: 04/04/2017      PT End of Session - 04/04/17 1452    Visit Number 7   Number of Visits 9   Date for PT Re-Evaluation 04/10/17   PT Start Time 1302   PT Stop Time 1349   PT Time Calculation (min) 47 min   Activity Tolerance Patient tolerated treatment well;Other (comment)  patient continues to report feeling overwhelmed with information from various care providers   Behavior During Therapy [facility] for tasks assessed/performed      Past Medical History:  Diagnosis Date  . Anxiety   . Degenerative disk disease   . Diverticulosis   . Hyperlipemia   . LBP (low back pain)   . Osteoarthritis   . PONV (postoperative nausea and vomiting)   . Vitamin B deficiency   . Vitamin D deficiency     Past Surgical History:  Procedure Laterality Date  . ABDOMINAL HYSTERECTOMY  2005   SUPRACERVICAL HYSTERECTOMY  . APPENDECTOMY    . BREAST BIOPSY Left 06/03/2013   Procedure: BREAST BIOPSY WITH NEEDLE LOCALIZATION;  Surgeon: Currie Paris, MD;  Location: [facility];  Service: General;  Laterality: Lef also lumpectomy;  . BREAST BIOPSY Left 12/21/2016  . BREAST LUMPECTOMY WITH RADIOACTIVE SEED AND SENTINEL LYMPH NODE BIOPSY Left 01/25/2017   Procedure: LEFT BREAST LUMPECTOMY WITH RADIOACTIVE SEED AND SENTINEL LYMPH NODE BIOPSY;  Surgeon: Griselda Miner, MD;  Location: [facility];  Service: General;  Laterality: Left;  . BREAST SURGERY  2000   breast reduction... BREAST BIOPSY ON OCTOBER 2014  . cataract surgery Bilateral   . CERVICAL FUSION  1999 & 2009   C3-4 Elsner  . EYE SURGERY Left 2012   cataract  . JOINT REPLACEMENT    . SPINE SURGERY  1999&2009  . TONSILLECTOMY     . TOTAL HIP ARTHROPLASTY  (L) 2007 (R) 2011   [facility]    There were no vitals filed for this visit.      Subjective Assessment - 04/04/17 1305    Subjective Doing better with the medication symptoms.  "I'm okay."  I'm just tired.  I don't know why, I just have been. Forgot about the pelvic tilt exercises.   Currently in Pain? Yes   Pain Score 4    Pain Location Back   Pain Orientation Lower   Pain Descriptors / Indicators Aching   Aggravating Factors  possibly the Theraband exercises lying down or standingup   Pain Relieving Factors Tylenol                         [facility] Adult PT Treatment/Exercise - 04/04/17 0001      Self-Care   Self-Care Other Self-Care Comments   Other Self-Care Comments  began educating about strength ABC program, then [facility]  See info at clinical impression section below.     Lumbar Exercises: Supine   Bridge 10 reps;3 seconds   Other Supine Lumbar Exercises pelvic tilts with 5 sec holds x 10 reps, pelvic tilts with marches x 10 reps, pelvic tilts with 10 clams, pelvic tilts with SLR x 10 each side  PT Education - 04/04/17 1451    Education provided Yes   Education Details about Strength After Breast Cancer program and about [facility]; also about our recommendations for compression sleeve and for having labs drawn and blood pressure taken on nonsurgical side arm   Person(s) Educated Patient   Methods Explanation;Handout   Comprehension Verbalized understanding;Need further instruction                Long Term Clinic Goals - 03/24/17 1108      CC Long Term Goal  #1   Title Pt will be able to independently verbalize lymphedema risk reduction practices   Baseline pt able to independently verbalize   Time 4   Period Weeks   Status Achieved     CC Long Term Goal  #2   Title Pt will be independent in a home exercise program for continued strengthening and stretching  for posture   Time 4   Period Weeks   Status On-going     CC Long Term Goal  #3   Title Pt will demonstrate left shoulder abduction strength of 4/5 to allow pt to return to prior level of function   Baseline 3+/5   Time 4   Period Weeks   Status On-going     CC Long Term Goal  #4   Title Pt will demonstrate 140 degrees of left shoulder abduction to allow pt to reach items out to sides.   Baseline 124, 03/24/17- 132 degrees    Time 4   Period Weeks   Status On-going     CC Long Term Goal  #5   Title Pt will report at least a 60% improvement in pain in bilateral upper arms to allow improved function.   Baseline 03/24/17- 50% improvement   Time 4   Period Weeks   Status On-going            Plan - 04/04/17 1454    Clinical Impression Statement Patient is feeling better than last time she was here in terms of what she thinks is a reaction to her medication.  She expressed today, as she has apparently done before, that she feels overwhelmed by how much information she has been given during the course of her treatment, and also by the fact that not all providers agree on everything.  For example, we have recommended she get a compression sleeve for use with airplane flight or vigorous activity, and she reports that Dr. Darnelle Catalan said she doesn't need that, so she doesn't know what to think.  We reviewed core exercises today, and she did okay with these, mainly needing cueing for her breathing during exercise.  I had planned to begin having her do the Strength ABC program, as was on the plan from the last visit, but in the course of explaining it, she thought it might be too much for her to manage on her own.  I explained that we would guide her through it and showed her that the handout explains everything in detail, but she still was hesitant.  We then talked about [facility] because [facility] before and brought up that she might want to [facility] the [facility] and have a  trainer go over exercises with her.  She was given two flyers about the [place] program and said she would call Lauren, who heads that program locally. My impression is that it may be better to skip the Strength  ABC program with her, even though I think it is very manageable with the detailed information we could give her.   Rehab Potential Good   Clinical Impairments Affecting Rehab Potential hx of radiation   PT Frequency 2x / week   PT Duration 4 weeks   PT Treatment/Interventions ADLs/Self Care Home Management;Electrical Stimulation;Iontophoresis 4mg /ml Dexamethasone;Therapeutic exercise;Therapeutic activities;Patient/family education;Passive range of motion;Manual techniques   PT Next Visit Plan Check to see if she [facility] about the [place] [facility].  Consider review of all of the HEP she has been given to date.  Perhaps go over ideas for her working out at the [facility]. Review lymphedema risk reduction practices from our perspective.   Consulted and Agree with Plan of Care Patient      Patient will benefit from skilled therapeutic intervention in order to improve the following deficits and impairments:  Pain, Postural dysfunction, Decreased range of motion, Decreased strength, Decreased knowledge of precautions  Visit Diagnosis: Abnormal posture  Muscle weakness (generalized)  Chronic left shoulder pain  Stiffness of left shoulder, not elsewhere classified     Problem List Patient Active Problem List   Diagnosis Date Noted  . Malignant neoplasm of upper-outer quadrant of left breast in female, estrogen receptor positive (HCC) 01/04/2017  . 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 Lichen sclerosus et atrophicus 06/16/2016  . Osteopenia 06/16/2016  . Radiculopathy of lumbar region 03/03/2016  . Well adult exam 09/28/2015  . Conjunctivitis 06/12/2015  . Insomnia 02/09/2015  . Osteoarthritis of both knees 02/09/2015  . Hemorrhoid 02/09/2015  . Memory impairment  10/20/2014  . Abdominal pain 10/20/2014  . B12 deficiency 11/13/2013  . Right maxillary sinusitis 10/28/2013  . Chest pain 07/17/2013  . Situational mixed anxiety and depressive disorder 10/28/2012  . Vaginal atrophy 04/26/2011  . Post-menopausal 04/26/2011  . PARESTHESIA 09/28/2010  . Obesity 03/15/2010  . HIP PAIN 03/15/2010  . ALOPECIA 12/08/2009  . SINUSITIS, ACUTE 07/20/2009  . TOBACCO USE, QUIT 06/12/2009  . INGUINAL PAIN, RIGHT 08/12/2008  . UPPER RESPIRATORY INFECTION (URI) 09/19/2007  . NECK PAIN 06/06/2007  . Vitamin D deficiency 05/02/2007  . Dyslipidemia 05/02/2007  . ANEMIA, VITAMIN B12 DEFICIENCY NEC 05/02/2007  . Anxiety state 05/02/2007  . OSTEOARTHRITIS 05/02/2007  . LOW BACK PAIN 05/02/2007    [name] 04/04/2017, 3:02 PM  [facility] [address] [place], [place], [address] Phone: [phone]   Fax:  [phone]  Name: [name] MRN: [id number] Date of Birth: April 15, 1941  Micheline Maze, PT 04/04/17 3:03 PM

## 2017-04-06 ENCOUNTER — Encounter: Payer: Medicare Other | Admitting: Physical Therapy

## 2017-04-11 ENCOUNTER — Ambulatory Visit: Payer: Medicare Other | Admitting: Physical Therapy

## 2017-04-11 ENCOUNTER — Encounter: Payer: Self-pay | Admitting: Physical Therapy

## 2017-04-11 DIAGNOSIS — R293 Abnormal posture: Secondary | ICD-10-CM

## 2017-04-11 DIAGNOSIS — G8929 Other chronic pain: Secondary | ICD-10-CM

## 2017-04-11 DIAGNOSIS — M25511 Pain in right shoulder: Secondary | ICD-10-CM

## 2017-04-11 DIAGNOSIS — M25512 Pain in left shoulder: Secondary | ICD-10-CM

## 2017-04-11 DIAGNOSIS — M25612 Stiffness of left shoulder, not elsewhere classified: Secondary | ICD-10-CM

## 2017-04-11 DIAGNOSIS — M6281 Muscle weakness (generalized): Secondary | ICD-10-CM

## 2017-04-11 DIAGNOSIS — M25611 Stiffness of right shoulder, not elsewhere classified: Secondary | ICD-10-CM

## 2017-04-11 NOTE — Therapy (Signed)
[place], [place], [address] Phone: [phone]   Fax:  [phone]  Physical Therapy Treatment  Patient Details  Name: [name] MRN: [id number] Date of Birth: Nov 06, 1940 Referring Provider: Magrinat  Encounter Date: 04/11/2017      PT End of Session - 04/11/17 4656    Visit Number 8   Number of Visits 9   Date for PT Re-Evaluation 05/09/17   PT Start Time 1302   PT Stop Time 1346   PT Time Calculation (min) 44 min   Activity Tolerance Patient tolerated treatment well   Behavior During Therapy [facility] for tasks assessed/performed      Past Medical History:  Diagnosis Date  . Anxiety   . Degenerative disk disease   . Diverticulosis   . Hyperlipemia   . LBP (low back pain)   . Osteoarthritis   . PONV (postoperative nausea and vomiting)   . Vitamin B deficiency   . Vitamin D deficiency     Past Surgical History:  Procedure Laterality Date  . ABDOMINAL HYSTERECTOMY  2005   SUPRACERVICAL HYSTERECTOMY  . APPENDECTOMY    . BREAST BIOPSY Left 06/03/2013   Procedure: BREAST BIOPSY WITH NEEDLE LOCALIZATION;  Surgeon: [name], MD;  Location: [facility];  Service: General;  Laterality: Lef also lumpectomy;  . BREAST BIOPSY Left 12/21/2016  . BREAST LUMPECTOMY WITH RADIOACTIVE SEED AND SENTINEL LYMPH NODE BIOPSY Left 01/25/2017   Procedure: LEFT BREAST LUMPECTOMY WITH RADIOACTIVE SEED AND SENTINEL LYMPH NODE BIOPSY;  Surgeon: Jovita Kussmaul, MD;  Location: [facility];  Service: General;  Laterality: Left;  . BREAST SURGERY  2000   breast reduction... BREAST BIOPSY ON OCTOBER 2014  . cataract surgery Bilateral   . CERVICAL FUSION  1999 & 2009   C3-4 Elsner  . EYE SURGERY Left 2012   cataract  . JOINT REPLACEMENT    . SPINE SURGERY  1999&2009  . TONSILLECTOMY    . TOTAL HIP ARTHROPLASTY  (L) 2007 (R) 2011   [facility]usio    There were no vitals filed for this visit.       Subjective Assessment - 04/11/17 1306    Subjective I am having bad hot flashes. I am worried about my infusions. I have been doing my exercises. I have been doing the one on the door and the one pressing down in the bed. I am going to sign up for the [employer] program.    Pertinent History 12/21/2016 biopsy of the left breast upper outer quadrant area in question showed ([id number]) and invasive lobular carcinoma, grade 1, estrogen receptor 95% positive, progesterone receptor 80% positive, with an MIB-1 of 15%, and HER-2 amplified. [place] underwent left lumpectomy and sentinel lymph node sampling. The date was 01/25/2017. 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 The single sentinel lymph node was clear. DDD, bilateral hip replacements   Patient Stated Goals to learn how to reduce my risk of swelling   Currently in Pain? Yes   Pain Score 4    Pain Location Back   Pain Orientation Lower   Pain Descriptors / Indicators Aching   Pain Type Chronic pain                         [facility] Adult PT Treatment/Exercise - 04/11/17 0001      Lumbar Exercises: Supine   Other Supine Lumbar Exercises  Meeks decompression exercises with 3 sec holds x 5 reps   Other Supine Lumbar Exercises pelvic tilts with 5 sec holds x 10 reps, bridging x 10- pt began having cramping with bridges in her hamstring, instructed pt in seated hamstring exercise     Shoulder Exercises: Supine   Horizontal ABduction Strengthening;Both;10 reps;Theraband   Theraband Level (Shoulder Horizontal ABduction) Level 2 (Red)   External Rotation Strengthening;Both;10 reps;Theraband   Theraband Level (Shoulder External Rotation) Level 2 (Red)   Flexion Strengthening;Both;10 reps;Theraband   Theraband Level (Shoulder Flexion) Level 2 (Red)   Other Supine Exercises D2 x 10 reps bilaterally with red theraband                        Long Term Clinic  Goals - 04/11/17 1311      CC Long Term Goal  #1   Title Pt will be able to independently verbalize lymphedema risk reduction practices   Baseline pt able to independently verbalize   Time 4   Period Weeks   Status Achieved     CC Long Term Goal  #2   Title Pt will be independent in a home exercise program for continued strengthening and stretching for posture   Baseline 04/11/17- pt states she is independent in her home exercise program, will add Strength ABC next session   Time 4   Period Weeks   Status On-going     CC Long Term Goal  #3   Title Pt will demonstrate left shoulder abduction strength of 4/5 to allow pt to return to prior level of function   Baseline 3+/5, 04/11/17- pt states that today is not a good day for her shoulder but she states she has arthritis and this is normal for her   Time 4   Period Weeks   Status Not Met     CC Long Term Goal  #4   Title Pt will demonstrate 140 degrees of left shoulder abduction to allow pt to reach items out to sides.   Baseline 124, 03/24/17- 132 degrees, 04/11/17- 164 degrees   Time 4   Period Weeks   Status Achieved     CC Long Term Goal  #5   Title Pt will report at least a 60% improvement in pain in bilateral upper arms to allow improved function.   Baseline 03/24/17- 50% improvement, 04/11/17- 70% improvement   Time 4   Period Weeks   Status Achieved            Plan - 04/11/17 1359    Clinical Impression Statement Assessed progress towards all goals in therapy. Patient has almost met all goals for therapy. Today re educated pt about importance of Strength ABC program and pt is willing to learn exercises as next session prior to discharge from therapy. She will begin [employer] once she completes her chemotherapy sometime between Jan and March 2019. Once pt is indpendent in Strength ABC program she will be ready for discharge from PT.    Rehab Potential Good   Clinical Impairments Affecting Rehab Potential hx of radiation    PT Frequency 1x / week   PT Duration 4 weeks   PT Treatment/Interventions ADLs/Self Care Home Management;Electrical Stimulation;Iontophoresis 46m/ml Dexamethasone;Therapeutic exercise;Therapeutic activities;Patient/family education;Passive range of motion;Manual techniques   PT Next Visit Plan Instruct in strength ABC and d/c if indep with this   Consulted and Agree with Plan of Care Patient      Patient will benefit from  skilled therapeutic intervention in order to improve the following deficits and impairments:  Pain, Postural dysfunction, Decreased range of motion, Decreased strength, Decreased knowledge of precautions  Visit Diagnosis: Abnormal posture - Plan: PT plan of care cert/re-cert  Muscle weakness (generalized) - Plan: PT plan of care cert/re-cert  Chronic left shoulder pain - Plan: PT plan of care cert/re-cert  Stiffness of left shoulder, not elsewhere classified - Plan: PT plan of care cert/re-cert  Stiffness of right shoulder, not elsewhere classified - Plan: PT plan of care cert/re-cert  Chronic right shoulder pain - Plan: PT plan of care cert/re-cert     Problem List Patient Active Problem List   Diagnosis Date Noted  . Malignant neoplasm of upper-outer quadrant of left breast in female, estrogen receptor positive ([place]) 01/04/2017  . 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 Lichen sclerosus et atrophicus 06/16/2016  . Osteopenia 06/16/2016  . Radiculopathy of lumbar region 03/03/2016  . Well adult exam 09/28/2015  . Conjunctivitis 06/12/2015  . Insomnia 02/09/2015  . Osteoarthritis of both knees 02/09/2015  . Hemorrhoid 02/09/2015  . Memory impairment 10/20/2014  . Abdominal pain 10/20/2014  . B12 deficiency 11/13/2013  . Right maxillary sinusitis 10/28/2013  . Chest pain 07/17/2013  . Situational mixed anxiety and depressive disorder 10/28/2012  . Vaginal atrophy 04/26/2011  . Post-menopausal 04/26/2011  . PARESTHESIA 09/28/2010  . Obesity  03/15/2010  . HIP PAIN 03/15/2010  . ALOPECIA 12/08/2009  . SINUSITIS, ACUTE 07/20/2009  . TOBACCO USE, QUIT 06/12/2009  . INGUINAL PAIN, RIGHT 08/12/2008  . UPPER RESPIRATORY INFECTION (URI) 09/19/2007  . NECK PAIN 06/06/2007  . Vitamin D deficiency 05/02/2007  . Dyslipidemia 05/02/2007  . ANEMIA, VITAMIN B12 DEFICIENCY NEC 05/02/2007  . Anxiety state 05/02/2007  . OSTEOARTHRITIS 05/02/2007  . LOW BACK PAIN 05/02/2007    Allyson Sabal [facility] 04/11/2017, 2:05 PM  [place], [place], [address] Phone: [phone]   Fax:  [phone]  Name: CARSON MECHE MRN: [id number] Date of Birth: 1941-02-25  Manus Gunning, PT 04/11/17 2:05 PM

## 2017-04-12 ENCOUNTER — Other Ambulatory Visit (HOSPITAL_BASED_OUTPATIENT_CLINIC_OR_DEPARTMENT_OTHER): Payer: Medicare Other

## 2017-04-12 ENCOUNTER — Ambulatory Visit (HOSPITAL_BASED_OUTPATIENT_CLINIC_OR_DEPARTMENT_OTHER): Payer: Medicare Other | Admitting: Adult Health

## 2017-04-12 ENCOUNTER — Ambulatory Visit (HOSPITAL_BASED_OUTPATIENT_CLINIC_OR_DEPARTMENT_OTHER): Payer: Medicare Other

## 2017-04-12 ENCOUNTER — Encounter: Payer: Self-pay | Admitting: Adult Health

## 2017-04-12 VITALS — BP 152/64 | HR 87 | Temp 97.8°F | Resp 18 | Wt 195.1 lb

## 2017-04-12 DIAGNOSIS — Z170 Estrogen receptor positive status [ER+]: Secondary | ICD-10-CM | POA: Diagnosis not present

## 2017-04-12 DIAGNOSIS — C50412 Malignant neoplasm of upper-outer quadrant of left female breast: Secondary | ICD-10-CM | POA: Diagnosis present

## 2017-04-12 DIAGNOSIS — Z5112 Encounter for antineoplastic immunotherapy: Secondary | ICD-10-CM | POA: Diagnosis present

## 2017-04-12 LAB — COMPREHENSIVE METABOLIC PANEL
ALT: 37 U/L (ref 0–55)
AST: 24 U/L (ref 5–34)
Albumin: 3.7 g/dL (ref 3.5–5.0)
Alkaline Phosphatase: 89 U/L (ref 40–150)
Anion Gap: 9 mEq/L (ref 3–11)
BUN: 16.3 mg/dL (ref 7.0–26.0)
CO2: 23 mEq/L (ref 22–29)
Calcium: 9.8 mg/dL (ref 8.4–10.4)
Chloride: 105 mEq/L (ref 98–109)
Creatinine: 0.8 mg/dL (ref 0.6–1.1)
EGFR: 74 mL/min/{1.73_m2} — ABNORMAL LOW (ref >90)
Glucose: 117 mg/dl (ref 70–140)
Potassium: 3.9 mEq/L (ref 3.5–5.1)
Sodium: 137 mEq/L (ref 136–145)
Total Bilirubin: 0.4 mg/dL (ref 0.20–1.20)
Total Protein: 7.5 g/dL (ref 6.4–8.3)

## 2017-04-12 LAB — CBC WITH DIFFERENTIAL/PLATELET
BASO%: 1.3 % (ref 0.0–2.0)
Basophils Absolute: 0.1 10*3/uL (ref 0.0–0.1)
EOS%: 2.2 % (ref 0.0–7.0)
Eosinophils Absolute: 0.1 10*3/uL (ref 0.0–0.5)
HCT: 42.2 % (ref 34.8–46.6)
HGB: 14.5 g/dL (ref 11.6–15.9)
LYMPH%: 27.3 % (ref 14.0–49.7)
MCH: 32.2 pg (ref 25.1–34.0)
MCHC: 34.3 g/dL (ref 31.5–36.0)
MCV: 93.9 fL (ref 79.5–101.0)
MONO#: 0.7 10*3/uL (ref 0.1–0.9)
MONO%: 11.1 % (ref 0.0–14.0)
NEUT#: 3.8 10*3/uL (ref 1.5–6.5)
NEUT%: 58.1 % (ref 38.4–76.8)
Platelets: 279 10*3/uL (ref 145–400)
RBC: 4.49 10*6/uL (ref 3.70–5.45)
RDW: 13.1 % (ref 11.2–14.5)
WBC: 6.6 10*3/uL (ref 3.9–10.3)
lymph#: 1.8 10*3/uL (ref 0.9–3.3)

## 2017-04-12 MED ORDER — DEXAMETHASONE SODIUM PHOSPHATE 10 MG/ML IJ SOLN
10.0000 mg | Freq: Once | INTRAMUSCULAR | Status: AC
  Administered 2017-04-12: 10 mg via INTRAVENOUS

## 2017-04-12 MED ORDER — DEXAMETHASONE SODIUM PHOSPHATE 10 MG/ML IJ SOLN
INTRAMUSCULAR | Status: AC
  Filled 2017-04-12: qty 1

## 2017-04-12 MED ORDER — ACETAMINOPHEN 325 MG PO TABS
ORAL_TABLET | ORAL | Status: AC
  Filled 2017-04-12: qty 2

## 2017-04-12 MED ORDER — ACETAMINOPHEN 325 MG PO TABS
650.0000 mg | ORAL_TABLET | Freq: Once | ORAL | Status: AC
  Administered 2017-04-12: 650 mg via ORAL

## 2017-04-12 MED ORDER — FAMOTIDINE IN NACL 20-0.9 MG/50ML-% IV SOLN
20.0000 mg | Freq: Once | INTRAVENOUS | Status: AC
  Administered 2017-04-12: 20 mg via INTRAVENOUS

## 2017-04-12 MED ORDER — FAMOTIDINE IN NACL 20-0.9 MG/50ML-% IV SOLN
INTRAVENOUS | Status: AC
  Filled 2017-04-12: qty 50

## 2017-04-12 MED ORDER — DIPHENHYDRAMINE HCL 25 MG PO CAPS
25.0000 mg | ORAL_CAPSULE | Freq: Once | ORAL | Status: AC
  Administered 2017-04-12: 25 mg via ORAL

## 2017-04-12 MED ORDER — SODIUM CHLORIDE 0.9 % IV SOLN
Freq: Once | INTRAVENOUS | Status: AC
  Administered 2017-04-12: 13:00:00 via INTRAVENOUS

## 2017-04-12 MED ORDER — SODIUM CHLORIDE 0.9 % IV SOLN: 10.0000 mg | Freq: Once | INTRAVENOUS | Status: DC

## 2017-04-12 MED ORDER — DIPHENHYDRAMINE HCL 25 MG PO CAPS
ORAL_CAPSULE | ORAL | Status: AC
  Filled 2017-04-12: qty 1

## 2017-04-12 MED ORDER — SODIUM CHLORIDE 0.9 % IV SOLN
6.0000 mg/kg | Freq: Once | INTRAVENOUS | Status: AC
  Administered 2017-04-12: 525 mg via INTRAVENOUS
  Filled 2017-04-12: qty 25

## 2017-04-12 NOTE — Patient Instructions (Signed)
Gabapentin capsules or tablets What is this medicine? GABAPENTIN (GA ba pen tin) is used to control partial seizures in adults with epilepsy. It is also used to treat certain types of nerve pain. This medicine may be used for other purposes; ask your health care provider or pharmacist if you have questions. COMMON BRAND NAME(S): Active-PAC with Gabapentin, Gabarone, Neurontin What should I tell my health care provider before I take this medicine? They need to know if you have any of these conditions: -kidney disease -suicidal thoughts, plans, or attempt; a previous suicide attempt by you or a family member -an unusual or allergic reaction to gabapentin, other medicines, foods, dyes, or preservatives -pregnant or trying to get pregnant -breast-feeding How should I use this medicine? Take this medicine by mouth with a glass of water. Follow the directions on the prescription label. You can take it with or without food. If it upsets your stomach, take it with food.Take your medicine at regular intervals. Do not take it more often than directed. Do not stop taking except on your doctor's advice. If you are directed to break the 600 or 800 mg tablets in half as part of your dose, the extra half tablet should be used for the next dose. If you have not used the extra half tablet within 28 days, it should be thrown away. A special MedGuide will be given to you by the pharmacist with each prescription and refill. Be sure to read this information carefully each time. Talk to your pediatrician regarding the use of this medicine in children. Special care may be needed. Overdosage: If you think you have taken too much of this medicine contact a poison control center or emergency room at once. NOTE: This medicine is only for you. Do not share this medicine with others. What if I miss a dose? If you miss a dose, take it as soon as you can. If it is almost time for your next dose, take only that dose. Do not  take double or extra doses. What may interact with this medicine? Do not take this medicine with any of the following medications: -other gabapentin products This medicine may also interact with the following medications: -alcohol -antacids -antihistamines for allergy, cough and cold -certain medicines for anxiety or sleep -certain medicines for depression or psychotic disturbances -homatropine; hydrocodone -naproxen -narcotic medicines (opiates) for pain -phenothiazines like chlorpromazine, mesoridazine, prochlorperazine, thioridazine This list may not describe all possible interactions. Give your health care provider a list of all the medicines, herbs, non-prescription drugs, or dietary supplements you use. Also tell them if you smoke, drink alcohol, or use illegal drugs. Some items may interact with your medicine. What should I watch for while using this medicine? Visit your doctor or health care professional for regular checks on your progress. You may want to keep a record at home of how you feel your condition is responding to treatment. You may want to share this information with your doctor or health care professional at each visit. You should contact your doctor or health care professional if your seizures get worse or if you have any new types of seizures. Do not stop taking this medicine or any of your seizure medicines unless instructed by your doctor or health care professional. Stopping your medicine suddenly can increase your seizures or their severity. Wear a medical identification bracelet or chain if you are taking this medicine for seizures, and carry a card that lists all your medications. You may get drowsy, dizzy,   or have blurred vision. Do not drive, use machinery, or do anything that needs mental alertness until you know how this medicine affects you. To reduce dizzy or fainting spells, do not sit or stand up quickly, especially if you are an older patient. Alcohol can  increase drowsiness and dizziness. Avoid alcoholic drinks. Your mouth may get dry. Chewing sugarless gum or sucking hard candy, and drinking plenty of water will help. The use of this medicine may increase the chance of suicidal thoughts or actions. Pay special attention to how you are responding while on this medicine. Any worsening of mood, or thoughts of suicide or dying should be reported to your health care professional right away. Women who become pregnant while using this medicine may enroll in the [place] American Antiepileptic Drug Pregnancy Registry by calling 1-888-233-2334. This registry collects information about the safety of antiepileptic drug use during pregnancy. What side effects may I notice from receiving this medicine? Side effects that you should report to your doctor or health care professional as soon as possible: -allergic reactions like skin rash, itching or hives, swelling of the face, lips, or tongue -worsening of mood, thoughts or actions of suicide or dying Side effects that usually do not require medical attention (report to your doctor or health care professional if they continue or are bothersome): -constipation -difficulty walking or controlling muscle movements -dizziness -nausea -slurred speech -tiredness -tremors -weight gain This list may not describe all possible side effects. Call your doctor for medical advice about side effects. You may report side effects to FDA at 1-800-FDA-1088. Where should I keep my medicine? Keep out of reach of children. This medicine may cause accidental overdose and death if it taken by other adults, children, or pets. Mix any unused medicine with a substance like cat litter or coffee grounds. Then throw the medicine away in a sealed container like a sealed bag or a coffee can with a lid. Do not use the medicine after the expiration date. Store at room temperature between 15 and 30 degrees C (59 and 86 degrees F). NOTE: This  sheet is a summary. It may not cover all possible information. If you have questions about this medicine, talk to your doctor, pharmacist, or health care provider.  2018 Elsevier/Gold Standard (2013-09-13 15:26:50)  

## 2017-04-12 NOTE — Patient Instructions (Signed)
[facility] Discharge Instructions for Patients Receiving Chemotherapy  Today you received the following chemotherapy agents:  Herceptin, (trastuzumab)  To help prevent nausea and vomiting after your treatment, we encourage you to take your nausea medication as prescribed.    If you develop nausea and vomiting that is not controlled by your nausea medication, call the clinic.   BELOW ARE SYMPTOMS THAT SHOULD BE REPORTED IMMEDIATELY:  *FEVER GREATER THAN 100.5 F  *CHILLS WITH OR WITHOUT FEVER  NAUSEA AND VOMITING THAT IS NOT CONTROLLED WITH YOUR NAUSEA MEDICATION  *UNUSUAL SHORTNESS OF BREATH  *UNUSUAL BRUISING OR BLEEDING  TENDERNESS IN MOUTH AND THROAT WITH OR WITHOUT PRESENCE OF ULCERS  *URINARY PROBLEMS  *BOWEL PROBLEMS  UNUSUAL RASH Items with * indicate a potential emergency and should be followed up as soon as possible.  Feel free to call the clinic you have any questions or concerns. The clinic phone number is [phone].  Please show the [facility] at check-in to the Emergency Department and triage nurse.     Trastuzumab injection for infusion What is this medicine? TRASTUZUMAB (tras TOO zoo mab) is a monoclonal antibody. It is used to treat breast cancer and stomach cancer. This medicine may be used for other purposes; ask your health care provider or pharmacist if you have questions. COMMON BRAND NAME(S): Herceptin What should I tell my health care provider before I take this medicine? They need to know if you have any of these conditions: -heart disease -heart failure -lung or breathing disease, like asthma -an unusual or allergic reaction to trastuzumab, benzyl alcohol, or other medications, foods, dyes, or preservatives -pregnant or trying to get pregnant -breast-feeding How should I use this medicine? This drug is given as an infusion into a vein. It is administered in a hospital or clinic by a specially trained health care  professional. Talk to your pediatrician regarding the use of this medicine in children. This medicine is not approved for use in children. Overdosage: If you think you have taken too much of this medicine contact a poison control center or emergency room at once. NOTE: This medicine is only for you. Do not share this medicine with others. What if I miss a dose? It is important not to miss a dose. Call your doctor or health care professional if you are unable to keep an appointment. What may interact with this medicine? This medicine may interact with the following medications: -certain types of chemotherapy, such as daunorubicin, doxorubicin, epirubicin, and idarubicin This list may not describe all possible interactions. Give your health care provider a list of all the medicines, herbs, non-prescription drugs, or dietary supplements you use. Also tell them if you smoke, drink alcohol, or use illegal drugs. Some items may interact with your medicine. What should I watch for while using this medicine? Visit your doctor for checks on your progress. Report any side effects. Continue your course of treatment even though you feel ill unless your doctor tells you to stop. Call your doctor or health care professional for advice if you get a fever, chills or sore throat, or other symptoms of a cold or flu. Do not treat yourself. Try to avoid being around people who are sick. You may experience fever, chills and shaking during your first infusion. These effects are usually mild and can be treated with other medicines. Report any side effects during the infusion to your health care professional. Fever and chills usually do not happen with later infusions.  Do not become pregnant while taking this medicine or for 7 months after stopping it. Women should inform their doctor if they wish to become pregnant or think they might be pregnant. Women of child-bearing potential will need to have a negative pregnancy test  before starting this medicine. There is a potential for serious side effects to an unborn child. Talk to your health care professional or pharmacist for more information. Do not breast-feed an infant while taking this medicine or for 7 months after stopping it. Women must use effective birth control with this medicine. What side effects may I notice from receiving this medicine? Side effects that you should report to your doctor or health care professional as soon as possible: -allergic reactions like skin rash, itching or hives, swelling of the face, lips, or tongue -chest pain or palpitations -cough -dizziness -feeling faint or lightheaded, falls -fever -general ill feeling or flu-like symptoms -signs of worsening heart failure like breathing problems; swelling in your legs and feet -unusually weak or tired Side effects that usually do not require medical attention (report to your doctor or health care professional if they continue or are bothersome): -bone pain -changes in taste -diarrhea -joint pain -nausea/vomiting -weight loss This list may not describe all possible side effects. Call your doctor for medical advice about side effects. You may report side effects to FDA at 1-800-FDA-1088. Where should I keep my medicine? This drug is given in a hospital or clinic and will not be stored at home. NOTE: This sheet is a summary. It may not cover all possible information. If you have questions about this medicine, talk to your doctor, pharmacist, or health care provider.  2018 Elsevier/Gold Standard (2016-07-12 14:37:52)

## 2017-04-12 NOTE — Progress Notes (Signed)
[name]  [phone] Fax:([phone]) [phone]     ID: Christina BOHLKEN DOB: 1940-08-10  MR#: [id number]  RZN#:[id number]  Patient Care Team: Cassandria Anger, MD as PCP - General Terrance Mass, MD (Obstetrics and Gynecology) Kristeen Miss, MD as Attending Physician (Neurosurgery) Jovita Kussmaul, MD as Consulting Physician (General Surgery) Rolm Bookbinder, MD as Consulting Physician (Dermatology) Bensimhon, Shaune Pascal, MD as Consulting Physician (Cardiology) Scot Dock, NP OTHER MD:  CHIEF COMPLAINT: Triple positive breast cancer  CURRENT TREATMENT: Anastrozole, trastuzumab  BREAST CANCER HISTORY: From the original intake note:  The patient had bilateral screening mammography at [facility] 06/13/2016 showing a 5 mm mass in the left breast upper outer quadrant middle depth. This was felt to be most likely fat necrosis but additional imaging with ultrasonography was obtained the same day and confirmed a 0 point centimeter round oil cyst in the left breast at the 1:00 anterior depth. This was felt to be most likely benign but six--month follow-up diagnostic mammography of the left breast with tomography was recommended and performed 12/20/2016. The breast density was category A.. The oval mass in the left breast upper outer quadrant had increased in size.  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  The patient has met with my partner Dr. Burr Medico and is here today as a second opinion regarding how to proceed   INTERVAL HISTORY: [name] returns today for follow-up and treatment of her estrogen receptor positive breast cancer.  She is here prior to receiving Trastuzumab today.  She wants to  make sure she receives Dexamethasone today, since she had a reaction to her last Trastuzumab.  She is also requesting something additional for hot flashes.  She has started taking Estroven.  She couldn't tolerate Effexor.    REVIEW OF SYSTEMS: Quynh denies any issues with chest pain, palpitations, shortness of breath, swelling, arthralgias, or any other concerns. A detailed ROS is non contributory.    PAST MEDICAL HISTORY: Past Medical History:  Diagnosis Date  . Anxiety   . Degenerative disk disease   . Diverticulosis   . Hyperlipemia   . LBP (low back pain)   . Osteoarthritis   . PONV (postoperative nausea and vomiting)   . Vitamin B deficiency   . Vitamin D deficiency     PAST SURGICAL HISTORY: Past Surgical History:  Procedure Laterality Date  . ABDOMINAL HYSTERECTOMY  2005   SUPRACERVICAL HYSTERECTOMY  . APPENDECTOMY    . BREAST BIOPSY Left 06/03/2013   Procedure: BREAST BIOPSY WITH NEEDLE LOCALIZATION;  Surgeon: [name], MD;  Location: [facility];  Service: General;  Laterality: Lef also lumpectomy;  . BREAST BIOPSY Left 12/21/2016  . BREAST LUMPECTOMY WITH RADIOACTIVE SEED AND SENTINEL LYMPH NODE BIOPSY Left 01/25/2017   Procedure: LEFT BREAST LUMPECTOMY WITH RADIOACTIVE SEED AND SENTINEL LYMPH NODE BIOPSY;  Surgeon: Jovita Kussmaul, MD;  Location: [facility];  Service: General;  Laterality: Left;  . BREAST SURGERY  2000   breast reduction... BREAST BIOPSY ON OCTOBER 2014  . cataract surgery Bilateral   . CERVICAL FUSION  1999 & 2009   C3-4 [place]  . EYE SURGERY Left 2012   cataract  . JOINT REPLACEMENT    . SPINE  SURGERY  1999&2009  . TONSILLECTOMY    . TOTAL HIP ARTHROPLASTY  (L) 2007 (R) 2011   [facility]    FAMILY HISTORY Family History  Problem Relation Age of Onset  . Arthritis Mother 76       polymyositis  . Hypertension Father   . Heart disease Father   . Hypertension Brother   . Cancer Brother        Oral  She does not meet criteria  for genetics testing   GYNECOLOGIC HISTORY:  No LMP recorded. Patient has had a hysterectomy. Menarche age 43, first live birth age 72, she is [place] P1. She stopped having periods sometime in her early 44s. She did not use hormone replacement but did use some vaginal cream at times after menopause. 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 Her first marriage lasted only 2 years. She had no children from that marriage. Her second husband died from an aneurysm or than 30 years ago. That second marriage produced her son [name], 12 years old as of June 2018. He works for a [employer]. He is recently separated from his wife in [place] and is living with the patient temporarily. The patient does have 2 grandchildren. She attends [employer]    ADVANCED DIRECTIVES: Not in place. At the 01/11/2017 visit the patient tells me she intends to name her son as her healthcare part of attorney   HEALTH MAINTENANCE: Social History  Substance Use Topics  . Smoking status: Former Smoker    Packs/day: 3.00    Years: 20.00    Quit date: 04/19/1985  . Smokeless tobacco: Never Used  . Alcohol use Yes     Comment: occasionally      Colonoscopy:November 2008  PAP:  Bone density: 07/16/2015 showed osteopenia   Allergies  Allergen Reactions  . Effexor Xr [Venlafaxine Hcl Er] Nausea Only    Vision problems, dizziness, light headedness.   . Augmentin [Amoxicillin-Pot Clavulanate] Diarrhea    diarrhea  . Cymbalta [Duloxetine Hcl] Diarrhea    diarrhea  . Ezetimibe-Simvastatin Other (See Comments)  . Phentermine Hcl Other (See Comments)    Current Outpatient Prescriptions  Medication Sig Dispense Refill  . Acetaminophen (TYLENOL ARTHRITIS PAIN PO) Take by mouth.    Marland Kitchen anastrozole (ARIMIDEX) 1 MG tablet Take 1 tablet (1 mg total) by mouth daily. 90 tablet 3  . Ascorbic Acid (VITAMIN C PO)  Take by mouth daily.    . Biotin 1000 MCG tablet Take 2,000 mcg by mouth 3 (three) times daily.    . Cholecalciferol (VITAMIN D3) 1000 UNITS tablet Take 2,000 Units by mouth daily.      . diazepam (VALIUM) 5 MG tablet TAKE 1 TABLET TWICE A DAY AS NEEDED FOR ANXIETY OR MUSCLE SPASMS 60 tablet 1  . HYDROcodone-acetaminophen (NORCO/VICODIN) 5-325 MG tablet Take 1 tablet by mouth every 6 (six) hours as needed for moderate pain.    . Melatonin 10 MG TABS Take by mouth.    . Omega-3 Fatty Acids (FISH OIL) 1000 MG CAPS Take by mouth.    . vitamin B-12 (CYANOCOBALAMIN) 1000 MCG tablet Take 1,000 mcg by mouth daily.      . vitamin E 1000 UNIT capsule Take 1,000 Units by mouth daily.     No current facility-administered medications for this visit.     OBJECTIVE:   Vitals:  04/12/17 1212  BP: (!) 152/64  Pulse: 87  Resp: 18  Temp: 97.8 F (36.6 C)  SpO2: 98%     Body mass index is 33.49 kg/m.    ECOG FS:1 - Symptomatic but completely ambulatory GENERAL: Patient is a well appearing female in no acute distress HEENT:  Sclerae anicteric.  Oropharynx clear and moist. No ulcerations or evidence of oropharyngeal candidiasis. Neck is supple.  NODES:  No cervical, supraclavicular, or axillary lymphadenopathy palpated.  BREAST EXAM:  Deferred. LUNGS:  Clear to auscultation bilaterally.  No wheezes or rhonchi. HEART:  Regular rate and rhythm. No murmur appreciated. ABDOMEN:  Soft, nontender.  Positive, normoactive bowel sounds. No organomegaly palpated. MSK:  No focal spinal tenderness to palpation. Full range of motion bilaterally in the upper extremities. EXTREMITIES:  No peripheral edema.   SKIN:  Clear with no obvious rashes or skin changes. No nail dyscrasia. NEURO:  Nonfocal. Well oriented.  Appropriate affect.    LAB RESULTS:  CMP     Component Value Date/Time   NA 137 04/12/2017 1107   K 3.9 04/12/2017 1107   CL 105 10/20/2014 1405   CO2 23 04/12/2017 1107   GLUCOSE 117 04/12/2017  1107   GLUCOSE 99 05/18/2006 1500   BUN 16.3 04/12/2017 1107   CREATININE 0.8 04/12/2017 1107   CALCIUM 9.8 04/12/2017 1107   PROT 7.5 04/12/2017 1107   ALBUMIN 3.7 04/12/2017 1107   AST 24 04/12/2017 1107   ALT 37 04/12/2017 1107   ALKPHOS 89 04/12/2017 1107   BILITOT 0.40 04/12/2017 1107   GFRNONAA 86 (L) 05/24/2013 1304   GFRAA >90 05/24/2013 1304    No results found for: TOTALPROTELP, ALBUMINELP, A1GS, A2GS, BETS, BETA2SER, GAMS, MSPIKE, SPEI  No results found for: Nils Pyle, [facility]  Lab Results  Component Value Date   WBC 6.6 04/12/2017   NEUTROABS 3.8 04/12/2017   HGB 14.5 04/12/2017   HCT 42.2 04/12/2017   MCV 93.9 04/12/2017   PLT 279 04/12/2017      Chemistry      Component Value Date/Time   NA 137 04/12/2017 1107   K 3.9 04/12/2017 1107   CL 105 10/20/2014 1405   CO2 23 04/12/2017 1107   BUN 16.3 04/12/2017 1107   CREATININE 0.8 04/12/2017 1107      Component Value Date/Time   CALCIUM 9.8 04/12/2017 1107   ALKPHOS 89 04/12/2017 1107   AST 24 04/12/2017 1107   ALT 37 04/12/2017 1107   BILITOT 0.40 04/12/2017 1107       No results found for: LABCA2  No components found for: [id number]  No results for input(s): INR in the last 168 hours.  Urinalysis    Component Value Date/Time   COLORURINE YELLOW 10/20/2014 1405   APPEARANCEUR CLEAR 10/20/2014 1405   LABSPEC >=1.030 (A) 10/20/2014 1405   PHURINE 5.5 10/20/2014 1405   GLUCOSEU NEGATIVE 10/20/2014 1405   HGBUR NEGATIVE 10/20/2014 1405   BILIRUBINUR SMALL (A) 10/20/2014 1405   KETONESUR TRACE (A) 10/20/2014 1405   PROTEINUR NEGATIVE 08/24/2009 0900   UROBILINOGEN 0.2 10/20/2014 1405   NITRITE NEGATIVE 10/20/2014 1405   LEUKOCYTESUR NEGATIVE 10/20/2014 1405     STUDIES: Echocardiogram results again reviewed ELIGIBLE FOR AVAILABLE RESEARCH PROTOCOL: No  ASSESSMENT: 76 y.o.  woman status post left breast upper outer quadrant biopsy 12/21/2016 for a invasive  lobular carcinoma, grade 1, estrogen and progesterone receptor positive, with an MIB-1 of 15%, and HER-2 amplified (triple positive).  (1) left lumpectomy and sentinel  lymph node sampling 01/25/2017 showed a pT1b pN0, stage IA invasive lobular breast cancer, grade 2, with negative margins  (2) anti-HER-2 immunotherapy to consist of trastuzumab for 6 months starting 03/01/2017  (a) echo 02/21/2017 showed an ejection fraction of 60-65%  (3) adjuvant radiation omitted as per the multidisciplinary clinic discussion 02/15/2017   (4) anastrozole started 02/15/2017  PLAN: Korissa is doing well today.  She will proceed with Trastuzumab.  She is tolerating it well.  She has her next echo scheduled on 05/02/2017.  She is concerned about these hot flashes.  She does not want any other medications for the hot flashes.  I reviewed with her some lifestyle modifications she can try to help alleviate her symptoms.  I also reviewed with her that acupuncture may help.  She is going to look into that.  I gave her information in her AVS about Gabapentin to read about as this is another option that she can try.  I encouraged her to avoid over the counter supplements such as estroven as they can convert into estrogens in her body. She will continue the Anastrozole however.    Paula will return in 3 weeks for labs and Trastuzumab, and in 6 weeks for labs, appt with Dr. Jana Hakim, and Trastuzumab.    She knows to call for any other problems that may develop before then.  A total of (30) minutes of face-to-face time was spent with this patient with greater than 50% of that time in counseling and care-coordination.   Scot Dock, NP   04/12/2017 2:36 PM Medical Oncology and Hematology [facility] [address] [place], [place] [address] Tel. [phone]    Fax. [phone]

## 2017-04-13 ENCOUNTER — Ambulatory Visit: Payer: Medicare Other | Admitting: Physical Therapy

## 2017-04-18 ENCOUNTER — Ambulatory Visit: Payer: Medicare Other | Admitting: Physical Therapy

## 2017-04-21 ENCOUNTER — Encounter: Payer: Self-pay | Admitting: Physical Therapy

## 2017-04-21 ENCOUNTER — Ambulatory Visit: Payer: Medicare Other | Admitting: Physical Therapy

## 2017-04-21 DIAGNOSIS — M25611 Stiffness of right shoulder, not elsewhere classified: Secondary | ICD-10-CM

## 2017-04-21 DIAGNOSIS — G8929 Other chronic pain: Secondary | ICD-10-CM

## 2017-04-21 DIAGNOSIS — R293 Abnormal posture: Secondary | ICD-10-CM | POA: Diagnosis not present

## 2017-04-21 DIAGNOSIS — M25511 Pain in right shoulder: Secondary | ICD-10-CM

## 2017-04-21 DIAGNOSIS — M25512 Pain in left shoulder: Secondary | ICD-10-CM

## 2017-04-21 DIAGNOSIS — M25612 Stiffness of left shoulder, not elsewhere classified: Secondary | ICD-10-CM

## 2017-04-21 DIAGNOSIS — M6281 Muscle weakness (generalized): Secondary | ICD-10-CM

## 2017-04-21 NOTE — Therapy (Signed)
[place], [place], [address] Phone: [phone]   Fax:  [phone]  Physical Therapy Treatment  Patient Details  Name: [name] MRN: [id number] Date of Birth: 1940-09-05 Referring Provider: Magrinat  Encounter Date: 04/21/2017      PT End of Session - 04/21/17 1153    Visit Number 9   Number of Visits 9   Date for PT Re-Evaluation 05/09/17   PT Start Time 1107   PT Stop Time 1150   PT Time Calculation (min) 43 min   Activity Tolerance Patient tolerated treatment well   Behavior During Therapy [facility] for tasks assessed/performed      Past Medical History:  Diagnosis Date  . Anxiety   . Degenerative disk disease   . Diverticulosis   . Hyperlipemia   . LBP (low back pain)   . Osteoarthritis   . PONV (postoperative nausea and vomiting)   . Vitamin B deficiency   . Vitamin D deficiency     Past Surgical History:  Procedure Laterality Date  . ABDOMINAL HYSTERECTOMY  2005   SUPRACERVICAL HYSTERECTOMY  . APPENDECTOMY    . BREAST BIOPSY Left 06/03/2013   Procedure: BREAST BIOPSY WITH NEEDLE LOCALIZATION;  Surgeon: [name], MD;  Location: [place];  Service: General;  Laterality: Lef also lumpectomy;  . BREAST BIOPSY Left 12/21/2016  . BREAST LUMPECTOMY WITH RADIOACTIVE SEED AND SENTINEL LYMPH NODE BIOPSY Left 01/25/2017   Procedure: LEFT BREAST LUMPECTOMY WITH RADIOACTIVE SEED AND SENTINEL LYMPH NODE BIOPSY;  Surgeon: Jovita Kussmaul, MD;  Location: [facility];  Service: General;  Laterality: Left;  . BREAST SURGERY  2000   breast reduction... BREAST BIOPSY ON OCTOBER 2014  . cataract surgery Bilateral   . CERVICAL FUSION  1999 & 2009   C3-4 [facility]  . EYE SURGERY Left 2012   cataract  . JOINT REPLACEMENT    . SPINE SURGERY  1999&2009  . TONSILLECTOMY    . TOTAL HIP ARTHROPLASTY  (L) 2007 (R) 2011   [facility]    There were no vitals filed for this visit.       Subjective Assessment - 04/21/17 1109    Subjective (P)  I feel arthritic. I have the hip pain and lower back pain. I am doing mostly the exercises in the bed. I am waiting to hear from [place] when classes are available.    Pertinent History (P)  12/21/2016 biopsy of the left breast upper outer quadrant area in question showed ([id number]) and invasive lobular carcinoma, grade 1, estrogen receptor 95% positive, progesterone receptor 80% positive, with an MIB-1 of 15%, and HER-2 amplified. Bindi underwent left lumpectomy and sentinel lymph node sampling. The date was 01/25/2017. 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 The single sentinel lymph node was clear. DDD, bilateral hip replacements   Patient Stated Goals (P)  to learn how to reduce my risk of swelling   Currently in Pain? (P)  Yes   Pain Score (P)  4    Pain Location (P)  Back  hip   Pain Orientation (P)  Right;Lower   Pain Descriptors / Indicators (P)  Aching   Pain Type (P)  Chronic pain                         [facility] Adult PT Treatment/Exercise - 04/21/17 0001      Shoulder Exercises:  Standing   Other Standing Exercises Instructed pt in entire Strength ABC program today except for deadlifts, squats and bird/dog- held stretches 30 sec and did 10 reps of all exercises with 1 lb weights, had pt hold on to counter during quad stretch and calf raises                        Long Term Clinic Goals - 04/21/17 1152      CC Long Term Goal  #1   Title Pt will be able to independently verbalize lymphedema risk reduction practices   Baseline pt able to independently verbalize   Time 4   Period Weeks   Status Achieved     CC Long Term Goal  #2   Title Pt will be independent in a home exercise program for continued strengthening and stretching for posture   Baseline 04/11/17- pt states she is independent in her home exercise program, will add  Strength ABC next session   Time 4   Period Weeks   Status Achieved     CC Long Term Goal  #3   Title Pt will demonstrate left shoulder abduction strength of 4/5 to allow pt to return to prior level of function   Baseline 3+/5, 04/11/17- pt states that today is not a good day for her shoulder but she states she has arthritis and this is normal for her   Time 4   Period Weeks   Status Not Met     CC Long Term Goal  #4   Title Pt will demonstrate 140 degrees of left shoulder abduction to allow pt to reach items out to sides.   Baseline 124, 03/24/17- 132 degrees, 04/11/17- 164 degrees   Time 4   Period Weeks   Status Achieved     CC Long Term Goal  #5   Title Pt will report at least a 60% improvement in pain in bilateral upper arms to allow improved function.   Baseline 03/24/17- 50% improvement, 04/11/17- 70% improvement   Time 4   Period Weeks   Status Achieved            Plan - 04/21/17 1154    Clinical Impression Statement Instructed pt in entire strength ABC program except for deadlifts, squats and exercise on all 4s due to pt's joint pain. She will be participating in [place] during the next open class. She has met all goals for therapy and is ready to be discharged at this time.    Rehab Potential Good   PT Frequency 1x / week   PT Duration 4 weeks   PT Treatment/Interventions ADLs/Self Care Home Management;Electrical Stimulation;Iontophoresis 37m/ml Dexamethasone;Therapeutic exercise;Therapeutic activities;Patient/family education;Passive range of motion;Manual techniques   PT Next Visit Plan dc this visit   Consulted and Agree with Plan of Care Patient      Patient will benefit from skilled therapeutic intervention in order to improve the following deficits and impairments:  Pain, Postural dysfunction, Decreased range of motion, Decreased strength, Decreased knowledge of precautions  Visit Diagnosis: Abnormal posture  Muscle weakness (generalized)  Chronic left  shoulder pain  Stiffness of left shoulder, not elsewhere classified  Stiffness of right shoulder, not elsewhere classified  Chronic right shoulder pain     Problem List Patient Active Problem List   Diagnosis Date Noted  . Malignant neoplasm of upper-outer quadrant of left breast in female, estrogen receptor positive [place] 01/04/2017  . 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 Lichen  sclerosus et atrophicus 06/16/2016  . Osteopenia 06/16/2016  . Radiculopathy of lumbar region 03/03/2016  . Well adult exam 09/28/2015  . Conjunctivitis 06/12/2015  . Insomnia 02/09/2015  . Osteoarthritis of both knees 02/09/2015  . Hemorrhoid 02/09/2015  . Memory impairment 10/20/2014  . Abdominal pain 10/20/2014  . B12 deficiency 11/13/2013  . Right maxillary sinusitis 10/28/2013  . Chest pain 07/17/2013  . Situational mixed anxiety and depressive disorder 10/28/2012  . Vaginal atrophy 04/26/2011  . Post-menopausal 04/26/2011  . PARESTHESIA 09/28/2010  . Obesity 03/15/2010  . HIP PAIN 03/15/2010  . ALOPECIA 12/08/2009  . SINUSITIS, ACUTE 07/20/2009  . TOBACCO USE, QUIT 06/12/2009  . INGUINAL PAIN, RIGHT 08/12/2008  . UPPER RESPIRATORY INFECTION (URI) 09/19/2007  . NECK PAIN 06/06/2007  . Vitamin D deficiency 05/02/2007  . Dyslipidemia 05/02/2007  . ANEMIA, VITAMIN B12 DEFICIENCY NEC 05/02/2007  . Anxiety state 05/02/2007  . OSTEOARTHRITIS 05/02/2007  . LOW BACK PAIN 05/02/2007    Allyson Sabal [facility] 04/21/2017, 11:57 AM  [place], [place], [address] Phone: [phone]   Fax:  [phone]  Name: ALYSS GRANATO MRN: [id number] Date of Birth: 11/21/1940  PHYSICAL THERAPY DISCHARGE SUMMARY  Visits from Start of Care: 9  Current functional level related to goals / functional outcomes: See above   Remaining deficits: Pt still has some pain in her shoulder occasionally and during MMT due to  arthritis but this has greatly improved    Education / Equipment: HEP  Plan: Patient agrees to discharge.  Patient goals were met. Patient is being discharged due to meeting the stated rehab goals.  ?????    Allyson Sabal [place], [place] 04/21/17 11:58 AM

## 2017-04-27 ENCOUNTER — Encounter: Payer: Self-pay | Admitting: Gastroenterology

## 2017-05-01 ENCOUNTER — Telehealth: Payer: Self-pay

## 2017-05-01 NOTE — Telephone Encounter (Signed)
Pt called asking Dr Virgie Dad opinion if she should get a colonoscopy. Per [place] it is time to get one.   Discussed getting the flu shot, it is OK per Dr Jana Hakim. Discussed getting it half way between herceptin doses.   Hot flashes and night sweats are getting worse. Is there anything OTC she can take. She is asking if she can take Estroven OTC.  Or any other suggestions.   She had a bad reaction to effexor in the past.

## 2017-05-02 ENCOUNTER — Other Ambulatory Visit: Payer: Self-pay

## 2017-05-02 ENCOUNTER — Ambulatory Visit (HOSPITAL_COMMUNITY): Payer: Medicare Other | Attending: Cardiology

## 2017-05-02 ENCOUNTER — Telehealth: Payer: Self-pay | Admitting: *Deleted

## 2017-05-02 ENCOUNTER — Encounter: Payer: Self-pay | Admitting: Gastroenterology

## 2017-05-02 DIAGNOSIS — E785 Hyperlipidemia, unspecified: Secondary | ICD-10-CM | POA: Insufficient documentation

## 2017-05-02 DIAGNOSIS — Z170 Estrogen receptor positive status [ER+]: Secondary | ICD-10-CM

## 2017-05-02 DIAGNOSIS — C50412 Malignant neoplasm of upper-outer quadrant of left female breast: Secondary | ICD-10-CM | POA: Diagnosis not present

## 2017-05-02 MED ORDER — GABAPENTIN 100 MG PO CAPS: ORAL_CAPSULE | ORAL | 1 refills | Status: DC

## 2017-05-02 NOTE — Telephone Encounter (Signed)
This RN spoke with pt per her call yesterday with TRIAGE.  This RN discussed concerns for OTC hot flash remedies - including some herbal and or soy ingredients that have unknown and possible interaction with her history of breast cancer and or her anti estrogen medication.  Per phone discussion- pt is interested in the use of gabapentin or peridin C- she likes the peridin C but due to possible cost of $50 a month she would like to try a trial of the gabapentin.  Per colonoscopy - she will call and schedule an appointment with her new GI at [facility]  Current treatment regimen reviewed and this RN informed pt per MD she may obtain her flu shot tomorrow when she receives her Herceptin.  No other needs at this time.  Prescription for gabapentin sent to pharmacy as well as this RN discussed with pharmacist request to dispense 14 tabs for 1st dispense per pt request.

## 2017-05-03 ENCOUNTER — Ambulatory Visit (HOSPITAL_BASED_OUTPATIENT_CLINIC_OR_DEPARTMENT_OTHER): Payer: Medicare Other

## 2017-05-03 ENCOUNTER — Other Ambulatory Visit (HOSPITAL_BASED_OUTPATIENT_CLINIC_OR_DEPARTMENT_OTHER): Payer: Medicare Other

## 2017-05-03 VITALS — BP 120/65 | HR 77 | Temp 98.6°F | Resp 17

## 2017-05-03 DIAGNOSIS — C50412 Malignant neoplasm of upper-outer quadrant of left female breast: Secondary | ICD-10-CM | POA: Diagnosis present

## 2017-05-03 DIAGNOSIS — Z5112 Encounter for antineoplastic immunotherapy: Secondary | ICD-10-CM | POA: Diagnosis present

## 2017-05-03 DIAGNOSIS — Z170 Estrogen receptor positive status [ER+]: Principal | ICD-10-CM

## 2017-05-03 LAB — CBC WITH DIFFERENTIAL/PLATELET
BASO%: 0.7 % (ref 0.0–2.0)
Basophils Absolute: 0 10*3/uL (ref 0.0–0.1)
EOS%: 1.6 % (ref 0.0–7.0)
Eosinophils Absolute: 0.1 10*3/uL (ref 0.0–0.5)
HCT: 41 % (ref 34.8–46.6)
HGB: 13.9 g/dL (ref 11.6–15.9)
LYMPH%: 28 % (ref 14.0–49.7)
MCH: 32.4 pg (ref 25.1–34.0)
MCHC: 33.9 g/dL (ref 31.5–36.0)
MCV: 95.4 fL (ref 79.5–101.0)
MONO#: 0.8 10*3/uL (ref 0.1–0.9)
MONO%: 11.1 % (ref 0.0–14.0)
NEUT#: 4.1 10*3/uL (ref 1.5–6.5)
NEUT%: 58.6 % (ref 38.4–76.8)
Platelets: 272 10*3/uL (ref 145–400)
RBC: 4.3 10*6/uL (ref 3.70–5.45)
RDW: 13.3 % (ref 11.2–14.5)
WBC: 7.1 10*3/uL (ref 3.9–10.3)
lymph#: 2 10*3/uL (ref 0.9–3.3)

## 2017-05-03 LAB — COMPREHENSIVE METABOLIC PANEL
ALT: 30 U/L (ref 0–55)
AST: 20 U/L (ref 5–34)
Albumin: 3.6 g/dL (ref 3.5–5.0)
Alkaline Phosphatase: 89 U/L (ref 40–150)
Anion Gap: 8 mEq/L (ref 3–11)
BUN: 15.6 mg/dL (ref 7.0–26.0)
CO2: 24 mEq/L (ref 22–29)
Calcium: 9.6 mg/dL (ref 8.4–10.4)
Chloride: 107 mEq/L (ref 98–109)
Creatinine: 0.8 mg/dL (ref 0.6–1.1)
EGFR: 77 mL/min/{1.73_m2} — ABNORMAL LOW (ref >90)
Glucose: 97 mg/dl (ref 70–140)
Potassium: 4.1 mEq/L (ref 3.5–5.1)
Sodium: 139 mEq/L (ref 136–145)
Total Bilirubin: 0.38 mg/dL (ref 0.20–1.20)
Total Protein: 6.9 g/dL (ref 6.4–8.3)

## 2017-05-03 MED ORDER — ACETAMINOPHEN 325 MG PO TABS
ORAL_TABLET | ORAL | Status: AC
  Filled 2017-05-03: qty 2

## 2017-05-03 MED ORDER — FAMOTIDINE IN NACL 20-0.9 MG/50ML-% IV SOLN
20.0000 mg | Freq: Once | INTRAVENOUS | Status: AC
  Administered 2017-05-03: 20 mg via INTRAVENOUS

## 2017-05-03 MED ORDER — FAMOTIDINE IN NACL 20-0.9 MG/50ML-% IV SOLN
INTRAVENOUS | Status: AC
  Filled 2017-05-03: qty 50

## 2017-05-03 MED ORDER — DIPHENHYDRAMINE HCL 25 MG PO CAPS
ORAL_CAPSULE | ORAL | Status: AC
  Filled 2017-05-03: qty 1

## 2017-05-03 MED ORDER — TRASTUZUMAB CHEMO 150 MG IV SOLR
6.0000 mg/kg | Freq: Once | INTRAVENOUS | Status: AC
  Administered 2017-05-03: 525 mg via INTRAVENOUS
  Filled 2017-05-03: qty 25

## 2017-05-03 MED ORDER — DEXAMETHASONE SODIUM PHOSPHATE 10 MG/ML IJ SOLN
INTRAMUSCULAR | Status: AC
  Filled 2017-05-03: qty 1

## 2017-05-03 MED ORDER — DEXAMETHASONE SODIUM PHOSPHATE 10 MG/ML IJ SOLN
10.0000 mg | Freq: Once | INTRAMUSCULAR | Status: AC
  Administered 2017-05-03: 10 mg via INTRAVENOUS

## 2017-05-03 MED ORDER — ACETAMINOPHEN 325 MG PO TABS
650.0000 mg | ORAL_TABLET | Freq: Once | ORAL | Status: AC
  Administered 2017-05-03: 650 mg via ORAL

## 2017-05-03 MED ORDER — DIPHENHYDRAMINE HCL 25 MG PO CAPS
25.0000 mg | ORAL_CAPSULE | Freq: Once | ORAL | Status: AC
  Administered 2017-05-03: 25 mg via ORAL

## 2017-05-03 MED ORDER — SODIUM CHLORIDE 0.9 % IV SOLN
Freq: Once | INTRAVENOUS | Status: AC
  Administered 2017-05-03: 11:00:00 via INTRAVENOUS

## 2017-05-03 NOTE — Patient Instructions (Signed)
[facility] Discharge Instructions for Patients Receiving Chemotherapy  Today you received the following chemotherapy agents:  Herceptin, (trastuzumab)  To help prevent nausea and vomiting after your treatment, we encourage you to take your nausea medication as prescribed.    If you develop nausea and vomiting that is not controlled by your nausea medication, call the clinic.   BELOW ARE SYMPTOMS THAT SHOULD BE REPORTED IMMEDIATELY:  *FEVER GREATER THAN 100.5 F  *CHILLS WITH OR WITHOUT FEVER  NAUSEA AND VOMITING THAT IS NOT CONTROLLED WITH YOUR NAUSEA MEDICATION  *UNUSUAL SHORTNESS OF BREATH  *UNUSUAL BRUISING OR BLEEDING  TENDERNESS IN MOUTH AND THROAT WITH OR WITHOUT PRESENCE OF ULCERS  *URINARY PROBLEMS  *BOWEL PROBLEMS  UNUSUAL RASH Items with * indicate a potential emergency and should be followed up as soon as possible.  Feel free to call the clinic you have any questions or concerns. The clinic phone number is [phone].  Please show the [facility] at check-in to the Emergency Department and triage nurse.

## 2017-05-16 ENCOUNTER — Telehealth: Payer: Self-pay

## 2017-05-22 NOTE — Progress Notes (Signed)
[facility]  [phone] [phone]     ID: WILMETTA SPEISER DOB: [birth year]  MR#: [id number]  YTK#:[id number]  Patient Care Team: Cassandria Anger, MD as PCP - General Terrance Mass, MD (Obstetrics and Gynecology) Kristeen Miss, MD as Attending Physician (Neurosurgery) Jovita Kussmaul, MD as Consulting Physician (General Surgery) Rolm Bookbinder, MD as Consulting Physician (Dermatology) Bensimhon, Shaune Pascal, MD as Consulting Physician (Cardiology)  OTHER MD:  CHIEF COMPLAINT: Triple positive breast cancer  CURRENT TREATMENT: Anastrozole, trastuzumab  BREAST CANCER HISTORY: From the original intake note:  The patient had bilateral screening mammography at [facility] 06/13/2016 showing a 5 mm mass in the left breast upper outer quadrant middle depth. This was felt to be most likely fat necrosis but additional imaging with ultrasonography was obtained the same day and confirmed a 0 point centimeter round oil cyst in the left breast at the 1:00 anterior depth. This was felt to be most likely benign but six--month follow-up diagnostic mammography of the left breast with tomography was recommended and performed 12/20/2016. The breast density was category A.. The oval mass in the left breast upper outer quadrant had increased in size.  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  The patient has met with my partner Dr. Burr Medico and is here today as a second opinion regarding how to proceed   INTERVAL HISTORY: Allia returns today for follow-up and treatment of her estrogen receptor and HER-2 positive breast cancer. She is accompanied by her daughter. She is doing well overall. She continues  on anastrozole, with okay tolerance. She has hot flashes that wake her at night, but denies vaginal dryness. She started taking gabapentin for her hot flashes on 05/06/17 but d/c use on 05/22/2017 due to muscle spasms to her BLE.    Pt notes an increase in mood swings and increased frustration which she notes is unusual. She accidentally threw out her TV guide, which caused frustration this morning. She also finds herself searching for words when talking with others and having to use alternate words when speaking due to feeling confused.   She also receives trastuzumab every 21 days, with a dose due today. She denies any issues with   She just had an echocardiogram 05/02/2017 which shows an excellent ejection fraction.   REVIEW OF SYSTEMS: Ashyah reports that she recently completed Physical Therapy and she only completes some of the recommended exercises while in bed. She voices that she would love to participate in an exercise class in order to maintain her exercise. She plays cards every Saturday with her friends. She reports lightheadedness. She has insomnia which occurs nightly at approximately 3 AM and she will stay up on her computer or reading until 7 AM. She has constipation which she has been self-treating with probiotics at night. She has bowel movements that are soft. She had a resolved episode of diarrhea. She notes that her eyes get "tired" with prolonged reading or watching TV. She denies unusual headaches, nausea, vomiting, or dizziness. There has been no unusual cough, phlegm production, or pleurisy. This been no change in bladder habits. She denies unexplained fatigue or unexplained weight loss, bleeding, rash, or fever. A detailed review of systems was otherwise stable.   PAST  MEDICAL HISTORY: Past Medical History:  Diagnosis Date   Anxiety    Degenerative disk disease    Diverticulosis    Hyperlipemia    LBP (low back pain)    Osteoarthritis    PONV (postoperative nausea  and vomiting)    Vitamin B deficiency    Vitamin D deficiency     PAST SURGICAL HISTORY: Past Surgical History:  Procedure Laterality Date   ABDOMINAL HYSTERECTOMY  2005   SUPRACERVICAL HYSTERECTOMY   APPENDECTOMY     BREAST BIOPSY Left 06/03/2013   Procedure: BREAST BIOPSY WITH NEEDLE LOCALIZATION;  Surgeon: [name], MD;  Location: [facility];  Service: General;  Laterality: Lef also lumpectomy;   BREAST BIOPSY Left 12/21/2016   BREAST LUMPECTOMY WITH RADIOACTIVE SEED AND SENTINEL LYMPH NODE BIOPSY Left 01/25/2017   Procedure: LEFT BREAST LUMPECTOMY WITH RADIOACTIVE SEED AND SENTINEL LYMPH NODE BIOPSY;  Surgeon: Jovita Kussmaul, MD;  Location: [facility];  Service: General;  Laterality: Left;   BREAST SURGERY  2000   breast reduction... BREAST BIOPSY ON OCTOBER 2014   cataract surgery Bilateral    CERVICAL FUSION  1999 & 2009   C3-4 [facility]   EYE SURGERY Left 2012   cataract   JOINT REPLACEMENT     SPINE SURGERY  1999&2009   TONSILLECTOMY     TOTAL HIP ARTHROPLASTY  (L) 2007 (R) 2011   [facility]    FAMILY HISTORY Family History  Problem Relation Age of Onset   Arthritis Mother 70       polymyositis   Hypertension Father    Heart disease Father    Hypertension Brother    Cancer Brother        Oral  She does not meet criteria for genetics testing   GYNECOLOGIC HISTORY:  No LMP recorded. Patient has had a hysterectomy. Menarche age 16, first live birth age 78, she is [place] P1. She stopped having periods sometime in her early 80s. She did not use hormone replacement but did use some vaginal cream at times after menopause. 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 Her first marriage lasted only 2 years. She had no children from that marriage. Her second husband died from an aneurysm or than 30 years ago. That second marriage produced her son  Percell Miller, 39 years old as of June 2018. He works for a [employer]. He is recently separated from his wife in [place] and is living with the patient temporarily. The patient does have 2 grandchildren. She attends [employer]    ADVANCED DIRECTIVES: Not in place. At the 01/11/2017 visit the patient tells me she intends to name her son as her healthcare part of attorney   HEALTH MAINTENANCE: Social History  Substance Use Topics   Smoking status: Former Smoker    Packs/day: 3.00    Years: 20.00    Quit date: 04/19/1985   Smokeless tobacco: Never Used   Alcohol use Yes     Comment: occasionally      Colonoscopy:November 2008  PAP:  Bone density: 07/16/2015 showed osteopenia   Allergies  Allergen Reactions   Effexor Xr [Venlafaxine Hcl Er] Nausea Only    Vision problems, dizziness, light headedness.    Augmentin [Amoxicillin-Pot Clavulanate] Diarrhea    diarrhea   Cymbalta [Duloxetine Hcl] Diarrhea    diarrhea   Ezetimibe-Simvastatin Other (See Comments)  Phentermine Hcl Other (See Comments)    Current Outpatient Prescriptions  Medication Sig Dispense Refill   Acetaminophen (TYLENOL ARTHRITIS PAIN PO) Take by mouth.     anastrozole (ARIMIDEX) 1 MG tablet Take 1 tablet (1 mg total) by mouth daily. 90 tablet 3   Ascorbic Acid (VITAMIN C PO) Take by mouth daily.     Biotin 1000 MCG tablet Take 2,000 mcg by mouth 3 (three) times daily.     Cholecalciferol (VITAMIN D3) 1000 UNITS tablet Take 2,000 Units by mouth daily.       diazepam (VALIUM) 5 MG tablet TAKE 1 TABLET TWICE A DAY AS NEEDED FOR ANXIETY OR MUSCLE SPASMS 60 tablet 1   gabapentin (NEURONTIN) 100 MG capsule 1 po nightly and taper up to 3 tabs nightly 90 capsule 1   HYDROcodone-acetaminophen (NORCO/VICODIN) 5-325 MG tablet Take 1 tablet by mouth every 6 (six) hours as needed for moderate pain.     Melatonin 10 MG TABS Take by mouth.     Omega-3 Fatty Acids (FISH OIL) 1000 MG CAPS  Take by mouth.     vitamin B-12 (CYANOCOBALAMIN) 1000 MCG tablet Take 1,000 mcg by mouth daily.       vitamin E 1000 UNIT capsule Take 1,000 Units by mouth daily.     No current facility-administered medications for this visit.     OBJECTIVE: Older white woman who appears stated age  76:   05/24/17 1236  BP: 126/81  Pulse: 74  Resp: 18  Temp: (!) 97.4 F (36.3 C)  SpO2: 97%     Body mass index is 34.21 kg/m.    ECOG FS:1 - Symptomatic but completely ambulatory   Sclerae unicteric, EOMs intact Oropharynx clear and moist No cervical or supraclavicular adenopathy Lungs no rales or rhonchi Heart regular rate and rhythm Abd soft, nontender, positive bowel sounds MSK no focal spinal tenderness, no upper extremity lymphedema Neuro: nonfocal, well oriented, appropriate affect Breasts: Deferred    LAB RESULTS:  CMP     Component Value Date/Time   NA 139 05/03/2017 1021   K 4.1 05/03/2017 1021   CL 105 10/20/2014 1405   CO2 24 05/03/2017 1021   GLUCOSE 97 05/03/2017 1021   GLUCOSE 99 05/18/2006 1500   BUN 15.6 05/03/2017 1021   CREATININE 0.8 05/03/2017 1021   CALCIUM 9.6 05/03/2017 1021   PROT 6.9 05/03/2017 1021   ALBUMIN 3.6 05/03/2017 1021   AST 20 05/03/2017 1021   ALT 30 05/03/2017 1021   ALKPHOS 89 05/03/2017 1021   BILITOT 0.38 05/03/2017 1021   GFRNONAA 86 (L) 05/24/2013 1304   GFRAA >90 05/24/2013 1304    No results found for: TOTALPROTELP, ALBUMINELP, A1GS, A2GS, BETS, BETA2SER, GAMS, MSPIKE, SPEI  No results found for: Nils Pyle, [facility]  Lab Results  Component Value Date   WBC 7.1 05/24/2017   NEUTROABS 4.0 05/24/2017   HGB 13.5 05/24/2017   HCT 39.9 05/24/2017   MCV 94.7 05/24/2017   PLT 266 05/24/2017      Chemistry      Component Value Date/Time   NA 139 05/03/2017 1021   K 4.1 05/03/2017 1021   CL 105 10/20/2014 1405   CO2 24 05/03/2017 1021   BUN 15.6 05/03/2017 1021   CREATININE 0.8 05/03/2017 1021        Component Value Date/Time   CALCIUM 9.6 05/03/2017 1021   ALKPHOS 89 05/03/2017 1021   AST 20 05/03/2017 1021   ALT 30 05/03/2017 1021   BILITOT  0.38 05/03/2017 1021       No results found for: LABCA2  No components found for: [id number]  No results for input(s): INR in the last 168 hours.  Urinalysis    Component Value Date/Time   COLORURINE YELLOW 10/20/2014 1405   APPEARANCEUR CLEAR 10/20/2014 1405   LABSPEC >=1.030 (A) 10/20/2014 1405   PHURINE 5.5 10/20/2014 1405   GLUCOSEU NEGATIVE 10/20/2014 1405   HGBUR NEGATIVE 10/20/2014 1405   BILIRUBINUR SMALL (A) 10/20/2014 1405   KETONESUR TRACE (A) 10/20/2014 1405   PROTEINUR NEGATIVE 08/24/2009 0900   UROBILINOGEN 0.2 10/20/2014 1405   NITRITE NEGATIVE 10/20/2014 1405   LEUKOCYTESUR NEGATIVE 10/20/2014 1405     STUDIES: She will be due for repeat mammography, new baseline, November 2018  ELIGIBLE FOR AVAILABLE RESEARCH PROTOCOL: No  ASSESSMENT: 76 y.o. [place] woman status post left breast upper outer quadrant biopsy 12/21/2016 for a invasive lobular carcinoma, grade 1, estrogen and progesterone receptor positive, with an MIB-1 of 15%, and HER-2 amplified (triple positive).  (1) left lumpectomy and sentinel lymph node sampling 01/25/2017 showed a pT1b pN0, stage IA invasive lobular breast cancer, grade 2, with negative margins  (2) anti-HER-2 immunotherapy to consist of trastuzumab for 6 months starting 03/01/2017  (a) echo 02/21/2017 showed an ejection fraction of 60-65%  (b) echocardiogram 05/02/2017 shows an ejection fraction in the 60-65% range.  (3) adjuvant radiation omitted as per the multidisciplinary clinic discussion 02/15/2017   (4) anastrozole started 02/15/2017  PLAN: I spent approximately 30 minutes with Remo Lipps with most of that time spent discussing her complex problems. Shykeria is concern about irritability, some memory issues, and some problems with meds. I think a lot of this is really going to be  posttraumatic stress and we went over those symptoms in great detail today. I would like to give her venlafaxine but she did develop some visual changes and perhaps some confusion with that drug. We are going to try Wellbutrin at 150 mg daily and see if that is helpful.  Other possible contributing factors are use of Valium, the fact that she can't sleep because her son is in her home and keeps her up at night, and the fact that she also has hot flashes at night. Unfortunately she feels the gabapentin she took at bedtime and gave her cramps and she does not want to go back to that  For the hot flashes she is can try Peridin C  I have encouraged her to exercise. She does not feel she can start an exercise program until she is done with the Herceptin. That means it will be February before she starts  Overall I am somewhat concerned about Caitlen, not because of cancer but because of all the symptoms she is experiencing. I think it would be useful if she so as more frequently. I am asking her to see our advanced practice provider in 3 weeks. She will see me again in 6-9 weeks.      Magrinat, Virgie Dad, MD  05/24/17 12:54 PM Medical Oncology and Hematology [facility] [address] [place], [place] [address] Tel. [phone]    Fax. [phone]   This document serves as a record of services personally performed by Lurline Del, MD. It was created on her behalf by Steva Colder, a trained medical scribe. The creation of this record is based on the scribe's personal observations and the provider's statements to them. This document has been checked and approved by the attending provider.

## 2017-05-24 ENCOUNTER — Ambulatory Visit (HOSPITAL_BASED_OUTPATIENT_CLINIC_OR_DEPARTMENT_OTHER): Payer: Medicare Other | Admitting: Oncology

## 2017-05-24 ENCOUNTER — Other Ambulatory Visit (HOSPITAL_BASED_OUTPATIENT_CLINIC_OR_DEPARTMENT_OTHER): Payer: Medicare Other

## 2017-05-24 ENCOUNTER — Ambulatory Visit (HOSPITAL_BASED_OUTPATIENT_CLINIC_OR_DEPARTMENT_OTHER): Payer: Medicare Other

## 2017-05-24 VITALS — BP 126/81 | HR 74 | Temp 97.4°F | Resp 18 | Ht 64.0 in | Wt 199.3 lb

## 2017-05-24 DIAGNOSIS — C50412 Malignant neoplasm of upper-outer quadrant of left female breast: Secondary | ICD-10-CM

## 2017-05-24 DIAGNOSIS — Z170 Estrogen receptor positive status [ER+]: Secondary | ICD-10-CM

## 2017-05-24 DIAGNOSIS — Z5112 Encounter for antineoplastic immunotherapy: Secondary | ICD-10-CM

## 2017-05-24 LAB — COMPREHENSIVE METABOLIC PANEL
ALT: 31 U/L (ref 0–55)
AST: 22 U/L (ref 5–34)
Albumin: 3.7 g/dL (ref 3.5–5.0)
Alkaline Phosphatase: 80 U/L (ref 40–150)
Anion Gap: 9 mEq/L (ref 3–11)
BUN: 13.6 mg/dL (ref 7.0–26.0)
CO2: 24 mEq/L (ref 22–29)
Calcium: 9.7 mg/dL (ref 8.4–10.4)
Chloride: 106 mEq/L (ref 98–109)
Creatinine: 0.7 mg/dL (ref 0.6–1.1)
EGFR: >60 mL/min/{1.73_m2} (ref >60)
Glucose: 85 mg/dl (ref 70–140)
Potassium: 4.3 mEq/L (ref 3.5–5.1)
Sodium: 139 mEq/L (ref 136–145)
Total Bilirubin: 0.43 mg/dL (ref 0.20–1.20)
Total Protein: 7.2 g/dL (ref 6.4–8.3)

## 2017-05-24 LAB — CBC WITH DIFFERENTIAL/PLATELET
BASO%: 0.7 % (ref 0.0–2.0)
Basophils Absolute: 0 10*3/uL (ref 0.0–0.1)
EOS%: 1.8 % (ref 0.0–7.0)
Eosinophils Absolute: 0.1 10*3/uL (ref 0.0–0.5)
HCT: 39.9 % (ref 34.8–46.6)
HGB: 13.5 g/dL (ref 11.6–15.9)
LYMPH%: 29.3 % (ref 14.0–49.7)
MCH: 32.1 pg (ref 25.1–34.0)
MCHC: 33.9 g/dL (ref 31.5–36.0)
MCV: 94.7 fL (ref 79.5–101.0)
MONO#: 0.8 10*3/uL (ref 0.1–0.9)
MONO%: 11 % (ref 0.0–14.0)
NEUT#: 4 10*3/uL (ref 1.5–6.5)
NEUT%: 57.2 % (ref 38.4–76.8)
Platelets: 266 10*3/uL (ref 145–400)
RBC: 4.21 10*6/uL (ref 3.70–5.45)
RDW: 13.5 % (ref 11.2–14.5)
WBC: 7.1 10*3/uL (ref 3.9–10.3)
lymph#: 2.1 10*3/uL (ref 0.9–3.3)

## 2017-05-24 MED ORDER — DIPHENHYDRAMINE HCL 25 MG PO CAPS
25.0000 mg | ORAL_CAPSULE | Freq: Once | ORAL | Status: AC
  Administered 2017-05-24: 25 mg via ORAL

## 2017-05-24 MED ORDER — BUPROPION HCL ER (XL) 150 MG PO TB24: 150.0000 mg | ORAL_TABLET | Freq: Every day | ORAL | 6 refills | Status: DC

## 2017-05-24 MED ORDER — DEXAMETHASONE SODIUM PHOSPHATE 10 MG/ML IJ SOLN
INTRAMUSCULAR | Status: AC
  Filled 2017-05-24: qty 1

## 2017-05-24 MED ORDER — ACETAMINOPHEN 325 MG PO TABS
ORAL_TABLET | ORAL | Status: AC
  Filled 2017-05-24: qty 2

## 2017-05-24 MED ORDER — DIPHENHYDRAMINE HCL 25 MG PO CAPS
ORAL_CAPSULE | ORAL | Status: AC
  Filled 2017-05-24: qty 1

## 2017-05-24 MED ORDER — ACETAMINOPHEN 325 MG PO TABS
650.0000 mg | ORAL_TABLET | Freq: Once | ORAL | Status: AC
  Administered 2017-05-24: 650 mg via ORAL

## 2017-05-24 MED ORDER — FAMOTIDINE IN NACL 20-0.9 MG/50ML-% IV SOLN
20.0000 mg | Freq: Once | INTRAVENOUS | Status: AC
  Administered 2017-05-24: 20 mg via INTRAVENOUS

## 2017-05-24 MED ORDER — SODIUM CHLORIDE 0.9 % IV SOLN
Freq: Once | INTRAVENOUS | Status: AC
  Administered 2017-05-24: 15:00:00 via INTRAVENOUS

## 2017-05-24 MED ORDER — SODIUM CHLORIDE 0.9 % IV SOLN
6.0000 mg/kg | Freq: Once | INTRAVENOUS | Status: AC
  Administered 2017-05-24: 525 mg via INTRAVENOUS
  Filled 2017-05-24: qty 25

## 2017-05-24 MED ORDER — DEXAMETHASONE SODIUM PHOSPHATE 10 MG/ML IJ SOLN
10.0000 mg | Freq: Once | INTRAMUSCULAR | Status: AC
  Administered 2017-05-24: 10 mg via INTRAVENOUS

## 2017-05-24 MED ORDER — FAMOTIDINE IN NACL 20-0.9 MG/50ML-% IV SOLN
INTRAVENOUS | Status: AC
  Filled 2017-05-24: qty 50

## 2017-05-24 NOTE — Patient Instructions (Signed)
Trastuzumab injection for infusion What is this medicine? TRASTUZUMAB (tras TOO zoo mab) is a monoclonal antibody. It is used to treat breast cancer and stomach cancer. This medicine may be used for other purposes; ask your health care provider or pharmacist if you have questions. COMMON BRAND NAME(S): Herceptin What should I tell my health care provider before I take this medicine? They need to know if you have any of these conditions: -heart disease -heart failure -lung or breathing disease, like asthma -an unusual or allergic reaction to trastuzumab, benzyl alcohol, or other medications, foods, dyes, or preservatives -pregnant or trying to get pregnant -breast-feeding How should I use this medicine? This drug is given as an infusion into a vein. It is administered in a hospital or clinic by a specially trained health care professional. Talk to your pediatrician regarding the use of this medicine in children. This medicine is not approved for use in children. Overdosage: If you think you have taken too much of this medicine contact a poison control center or emergency room at once. NOTE: This medicine is only for you. Do not share this medicine with others. What if I miss a dose? It is important not to miss a dose. Call your doctor or health care professional if you are unable to keep an appointment. What may interact with this medicine? This medicine may interact with the following medications: -certain types of chemotherapy, such as daunorubicin, doxorubicin, epirubicin, and idarubicin This list may not describe all possible interactions. Give your health care provider a list of all the medicines, herbs, non-prescription drugs, or dietary supplements you use. Also tell them if you smoke, drink alcohol, or use illegal drugs. Some items may interact with your medicine. What should I watch for while using this medicine? Visit your doctor for checks on your progress. Report any side effects.  Continue your course of treatment even though you feel ill unless your doctor tells you to stop. Call your doctor or health care professional for advice if you get a fever, chills or sore throat, or other symptoms of a cold or flu. Do not treat yourself. Try to avoid being around people who are sick. You may experience fever, chills and shaking during your first infusion. These effects are usually mild and can be treated with other medicines. Report any side effects during the infusion to your health care professional. Fever and chills usually do not happen with later infusions. Do not become pregnant while taking this medicine or for 7 months after stopping it. Women should inform their doctor if they wish to become pregnant or think they might be pregnant. Women of child-bearing potential will need to have a negative pregnancy test before starting this medicine. There is a potential for serious side effects to an unborn child. Talk to your health care professional or pharmacist for more information. Do not breast-feed an infant while taking this medicine or for 7 months after stopping it. Women must use effective birth control with this medicine. What side effects may I notice from receiving this medicine? Side effects that you should report to your doctor or health care professional as soon as possible: -allergic reactions like skin rash, itching or hives, swelling of the face, lips, or tongue -chest pain or palpitations -cough -dizziness -feeling faint or lightheaded, falls -fever -general ill feeling or flu-like symptoms -signs of worsening heart failure like breathing problems; swelling in your legs and feet -unusually weak or tired Side effects that usually do not require medical   attention (report to your doctor or health care professional if they continue or are bothersome): -bone pain -changes in taste -diarrhea -joint pain -nausea/vomiting -weight loss This list may not describe all  possible side effects. Call your doctor for medical advice about side effects. You may report side effects to FDA at 1-800-FDA-1088. Where should I keep my medicine? This drug is given in a hospital or clinic and will not be stored at home. NOTE: This sheet is a summary. It may not cover all possible information. If you have questions about this medicine, talk to your doctor, pharmacist, or health care provider.  2018 Elsevier/Gold Standard (2016-07-12 14:37:52)  

## 2017-05-26 ENCOUNTER — Other Ambulatory Visit: Payer: Self-pay | Admitting: Oncology

## 2017-05-30 ENCOUNTER — Telehealth: Payer: Self-pay | Admitting: Oncology

## 2017-05-30 NOTE — Telephone Encounter (Signed)
Called and left message regarding appts per 10/24 los - left message with next appt date and time and cancellation of 11/14

## 2017-06-02 ENCOUNTER — Telehealth: Payer: Self-pay | Admitting: Oncology

## 2017-06-02 NOTE — Telephone Encounter (Signed)
Called patient regarding 11/2 sch message - rescheduled per patient request to 11/14 - patient is aware of appt date and time.

## 2017-06-14 ENCOUNTER — Other Ambulatory Visit: Payer: Self-pay | Admitting: Oncology

## 2017-06-14 ENCOUNTER — Other Ambulatory Visit: Payer: Medicare Other

## 2017-06-14 ENCOUNTER — Ambulatory Visit (HOSPITAL_BASED_OUTPATIENT_CLINIC_OR_DEPARTMENT_OTHER): Payer: Medicare Other

## 2017-06-14 ENCOUNTER — Ambulatory Visit (HOSPITAL_BASED_OUTPATIENT_CLINIC_OR_DEPARTMENT_OTHER): Payer: Medicare Other | Admitting: Adult Health

## 2017-06-14 ENCOUNTER — Ambulatory Visit: Payer: Medicare Other

## 2017-06-14 ENCOUNTER — Encounter: Payer: Self-pay | Admitting: Adult Health

## 2017-06-14 ENCOUNTER — Other Ambulatory Visit (HOSPITAL_BASED_OUTPATIENT_CLINIC_OR_DEPARTMENT_OTHER): Payer: Medicare Other

## 2017-06-14 VITALS — BP 142/60 | HR 104 | Temp 97.5°F | Resp 20 | Ht 64.0 in | Wt 198.0 lb

## 2017-06-14 DIAGNOSIS — Z170 Estrogen receptor positive status [ER+]: Secondary | ICD-10-CM | POA: Diagnosis not present

## 2017-06-14 DIAGNOSIS — C50412 Malignant neoplasm of upper-outer quadrant of left female breast: Secondary | ICD-10-CM

## 2017-06-14 DIAGNOSIS — Z5112 Encounter for antineoplastic immunotherapy: Secondary | ICD-10-CM | POA: Diagnosis present

## 2017-06-14 LAB — COMPREHENSIVE METABOLIC PANEL
ALT: 28 U/L (ref 0–55)
AST: 19 U/L (ref 5–34)
Albumin: 3.6 g/dL (ref 3.5–5.0)
Alkaline Phosphatase: 79 U/L (ref 40–150)
Anion Gap: 8 mEq/L (ref 3–11)
BUN: 15.7 mg/dL (ref 7.0–26.0)
CO2: 25 mEq/L (ref 22–29)
Calcium: 9.6 mg/dL (ref 8.4–10.4)
Chloride: 105 mEq/L (ref 98–109)
Creatinine: 0.9 mg/dL (ref 0.6–1.1)
EGFR: >60 mL/min/{1.73_m2} (ref >60)
Glucose: 107 mg/dl (ref 70–140)
Potassium: 4.1 mEq/L (ref 3.5–5.1)
Sodium: 138 mEq/L (ref 136–145)
Total Bilirubin: 0.43 mg/dL (ref 0.20–1.20)
Total Protein: 7 g/dL (ref 6.4–8.3)

## 2017-06-14 LAB — CBC WITH DIFFERENTIAL/PLATELET
BASO%: 0.7 % (ref 0.0–2.0)
Basophils Absolute: 0.1 10*3/uL (ref 0.0–0.1)
EOS%: 1.5 % (ref 0.0–7.0)
Eosinophils Absolute: 0.1 10*3/uL (ref 0.0–0.5)
HCT: 40.4 % (ref 34.8–46.6)
HGB: 13.5 g/dL (ref 11.6–15.9)
LYMPH%: 29.7 % (ref 14.0–49.7)
MCH: 31.8 pg (ref 25.1–34.0)
MCHC: 33.5 g/dL (ref 31.5–36.0)
MCV: 95 fL (ref 79.5–101.0)
MONO#: 0.8 10*3/uL (ref 0.1–0.9)
MONO%: 10.6 % (ref 0.0–14.0)
NEUT#: 4.4 10*3/uL (ref 1.5–6.5)
NEUT%: 57.5 % (ref 38.4–76.8)
Platelets: 272 10*3/uL (ref 145–400)
RBC: 4.25 10*6/uL (ref 3.70–5.45)
RDW: 13.2 % (ref 11.2–14.5)
WBC: 7.7 10*3/uL (ref 3.9–10.3)
lymph#: 2.3 10*3/uL (ref 0.9–3.3)

## 2017-06-14 MED ORDER — DIPHENHYDRAMINE HCL 25 MG PO CAPS
ORAL_CAPSULE | ORAL | Status: AC
  Filled 2017-06-14: qty 1

## 2017-06-14 MED ORDER — DEXAMETHASONE SODIUM PHOSPHATE 10 MG/ML IJ SOLN
INTRAMUSCULAR | Status: AC
  Filled 2017-06-14: qty 1

## 2017-06-14 MED ORDER — FAMOTIDINE IN NACL 20-0.9 MG/50ML-% IV SOLN
INTRAVENOUS | Status: AC
  Filled 2017-06-14: qty 50

## 2017-06-14 MED ORDER — DIPHENHYDRAMINE HCL 25 MG PO CAPS
25.0000 mg | ORAL_CAPSULE | Freq: Once | ORAL | Status: AC
  Administered 2017-06-14: 25 mg via ORAL

## 2017-06-14 MED ORDER — TRASTUZUMAB CHEMO 150 MG IV SOLR
6.0000 mg/kg | Freq: Once | INTRAVENOUS | Status: AC
  Administered 2017-06-14: 525 mg via INTRAVENOUS
  Filled 2017-06-14: qty 25

## 2017-06-14 MED ORDER — SODIUM CHLORIDE 0.9 % IV SOLN
Freq: Once | INTRAVENOUS | Status: AC
  Administered 2017-06-14: 13:00:00 via INTRAVENOUS

## 2017-06-14 MED ORDER — FAMOTIDINE IN NACL 20-0.9 MG/50ML-% IV SOLN
20.0000 mg | Freq: Once | INTRAVENOUS | Status: AC
  Administered 2017-06-14: 20 mg via INTRAVENOUS

## 2017-06-14 MED ORDER — ACETAMINOPHEN 325 MG PO TABS
ORAL_TABLET | ORAL | Status: AC
  Filled 2017-06-14: qty 2

## 2017-06-14 MED ORDER — DEXAMETHASONE SODIUM PHOSPHATE 10 MG/ML IJ SOLN
10.0000 mg | Freq: Once | INTRAMUSCULAR | Status: AC
  Administered 2017-06-14: 10 mg via INTRAVENOUS

## 2017-06-14 MED ORDER — ACETAMINOPHEN 325 MG PO TABS
650.0000 mg | ORAL_TABLET | Freq: Once | ORAL | Status: AC
  Administered 2017-06-14: 650 mg via ORAL

## 2017-06-14 NOTE — Progress Notes (Signed)
[facility]  [phone] [phone]) [phone]     ID: DANILA EDDIE DOB: Aug 10, 1940  MR#: [id number]  JYN#:[id number]  Patient Care Team: Cassandria Anger, MD as PCP - General Terrance Mass, MD (Obstetrics and Gynecology) Kristeen Miss, MD as Attending Physician (Neurosurgery) Jovita Kussmaul, MD as Consulting Physician (General Surgery) Rolm Bookbinder, MD as Consulting Physician (Dermatology) Bensimhon, Shaune Pascal, MD as Consulting Physician (Cardiology)  OTHER MD:  CHIEF COMPLAINT: Triple positive breast cancer  CURRENT TREATMENT: Anastrozole, trastuzumab  BREAST CANCER HISTORY: From the original intake note:  The patient had bilateral screening mammography at [facility] 06/13/2016 showing a 5 mm mass in the left breast upper outer quadrant middle depth. This was felt to be most likely fat necrosis but additional imaging with ultrasonography was obtained the same day and confirmed a 0 point centimeter round oil cyst in the left breast at the 1:00 anterior depth. This was felt to be most likely benign but six--month follow-up diagnostic mammography of the left breast with tomography was recommended and performed 12/20/2016. The breast density was category A.. The oval mass in the left breast upper outer quadrant had increased in size.  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  The patient has met with my partner Dr. Burr Medico and is here today as a second opinion regarding how to proceed   INTERVAL HISTORY: Cornisha returns today for follow-up and treatment of her estrogen receptor and HER-2 positive breast cancer. She is accompanied by her daughter. She is doing well overall. She continues  on anastrozole, with okay tolerance. She receives Trastuzumab every 3 weeks, due today. She was started on Wellbutrin at her last appointment with Dr. Jana Hakim for PTSD issues.  Her daughter is with her today and says that she is doing better and isn't as angry.  REVIEW OF SYSTEMS: Overall she is doing well.  She takes Anastrozole daily.  She does have some arthritis and does have the aches and pain associated with that.  She also has hot flashes, however the are improved.  She is not exercising.  She says she doesn't mentally feel like it.  Otherwise a detailed ROS was conducted and is non contributory.    PAST MEDICAL HISTORY: Past Medical History:  Diagnosis Date  . Anxiety   . Degenerative disk disease   . Diverticulosis   . Hyperlipemia   . LBP (low back pain)   . Osteoarthritis   . PONV (postoperative nausea and vomiting)   . Vitamin B deficiency   . Vitamin D deficiency     PAST SURGICAL HISTORY: Past Surgical History:  Procedure Laterality Date  . ABDOMINAL HYSTERECTOMY  2005   SUPRACERVICAL HYSTERECTOMY  . APPENDECTOMY    . BREAST BIOPSY Left 12/21/2016  . BREAST SURGERY  2000   breast reduction... BREAST BIOPSY ON OCTOBER 2014  . cataract surgery Bilateral   . CERVICAL FUSION  1999 & 2009   C3-4 [place]  . EYE SURGERY Left 2012   cataract  . JOINT REPLACEMENT    . SPINE SURGERY  1999&2009  . TONSILLECTOMY    . TOTAL HIP ARTHROPLASTY  (L) 2007 (R) 2011   [facility]    FAMILY HISTORY Family History  Problem Relation Age of  Onset  . Arthritis Mother 35       polymyositis  . Hypertension Father   . Heart disease Father   . Hypertension Brother   . Cancer Brother        Oral  She does not meet criteria for genetics testing   GYNECOLOGIC HISTORY:  No LMP recorded. Patient has had a hysterectomy. Menarche age 86, first live birth age 20, she is [place] P1. She stopped having periods sometime in her early 73s. She did not use hormone replacement but did use some vaginal  cream at times after menopause. 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 Her first marriage lasted only 2 years. She had no children from that marriage. Her second husband died from an aneurysm or than 30 years ago. That second marriage produced her son Percell Miller, 42 years old as of June 2018. He works for a [employer]. He is recently separated from his wife in [place] and is living with the patient temporarily. The patient does have 2 grandchildren. She attends [employer]    ADVANCED DIRECTIVES: Not in place. At the 01/11/2017 visit the patient tells me she intends to name her son as her healthcare part of attorney   HEALTH MAINTENANCE: Social History   Tobacco Use  . Smoking status: Former Smoker    Packs/day: 3.00    Years: 20.00    Pack years: 60.00    Last attempt to quit: 04/19/1985    Years since quitting: 32.1  . Smokeless tobacco: Never Used  Substance Use Topics  . Alcohol use: Yes    Comment: occasionally   . Drug use: No     Colonoscopy:November 2008  PAP:  Bone density: 07/16/2015 showed osteopenia   Allergies  Allergen Reactions  . Effexor Xr [Venlafaxine Hcl Er] Nausea Only    Vision problems, dizziness, light headedness.   . Augmentin [Amoxicillin-Pot Clavulanate] Diarrhea    diarrhea  . Cymbalta [Duloxetine Hcl] Diarrhea    diarrhea  . Ezetimibe-Simvastatin Other (See Comments)  . Phentermine Hcl Other (See Comments)    Current Outpatient Medications  Medication Sig Dispense Refill  . Acetaminophen (TYLENOL ARTHRITIS PAIN PO) Take by mouth.    Marland Kitchen anastrozole (ARIMIDEX) 1 MG tablet Take 1 tablet (1 mg total) by mouth daily. 90 tablet 3  . Ascorbic Acid (VITAMIN C PO) Take by mouth daily.    . Biotin 1000 MCG tablet Take 2,000 mcg by mouth 3 (three) times daily.    Marland Kitchen buPROPion (WELLBUTRIN XL) 150 MG 24 hr tablet Take 1 tablet (150 mg  total) by mouth daily. 30 tablet 6  . Cholecalciferol (VITAMIN D3) 1000 UNITS tablet Take 2,000 Units by mouth daily.      . diazepam (VALIUM) 5 MG tablet TAKE 1 TABLET TWICE A DAY AS NEEDED FOR ANXIETY OR MUSCLE SPASMS 60 tablet 1  . HYDROcodone-acetaminophen (NORCO/VICODIN) 5-325 MG tablet Take 1 tablet by mouth every 6 (six) hours as needed for moderate pain.    . Melatonin 10 MG TABS Take by mouth.    . Omega-3 Fatty Acids (FISH OIL) 1000 MG CAPS Take by mouth.    . vitamin B-12 (CYANOCOBALAMIN) 1000 MCG tablet Take 1,000 mcg by mouth daily.      . vitamin E 1000 UNIT capsule Take 1,000 Units by mouth daily.    Marland Kitchen gabapentin (NEURONTIN) 100 MG  capsule 1 po nightly and taper up to 3 tabs nightly (Patient not taking: Reported on 06/14/2017) 90 capsule 1   No current facility-administered medications for this visit.     OBJECTIVE:   Vitals:   06/14/17 1147  BP: (!) 142/60  Pulse: (!) 104  Resp: 20  Temp: (!) 97.5 F (36.4 C)  SpO2: 96%     Body mass index is 33.99 kg/m.    ECOG FS:1 - Symptomatic but completely ambulatory GENERAL: Patient is a well appearing female in no acute distress HEENT:  Sclerae anicteric.  Oropharynx clear and moist. No ulcerations or evidence of oropharyngeal candidiasis. Neck is supple.  NODES:  No cervical, supraclavicular, or axillary lymphadenopathy palpated.  BREAST EXAM:  Deferred. LUNGS:  Clear to auscultation bilaterally.  No wheezes or rhonchi. HEART:  Regular rate and rhythm. No murmur appreciated. ABDOMEN:  Soft, nontender.  Positive, normoactive bowel sounds. No organomegaly palpated. MSK:  No focal spinal tenderness to palpation. Full range of motion bilaterally in the upper extremities. EXTREMITIES:  No peripheral edema.   SKIN:  Clear with no obvious rashes or skin changes. No nail dyscrasia. NEURO:  Nonfocal. Well oriented.  Appropriate affect.      LAB RESULTS:  CMP     Component Value Date/Time   NA 139 05/24/2017 1215   K 4.3  05/24/2017 1215   CL 105 10/20/2014 1405   CO2 24 05/24/2017 1215   GLUCOSE 85 05/24/2017 1215   GLUCOSE 99 05/18/2006 1500   BUN 13.6 05/24/2017 1215   CREATININE 0.7 05/24/2017 1215   CALCIUM 9.7 05/24/2017 1215   PROT 7.2 05/24/2017 1215   ALBUMIN 3.7 05/24/2017 1215   AST 22 05/24/2017 1215   ALT 31 05/24/2017 1215   ALKPHOS 80 05/24/2017 1215   BILITOT 0.43 05/24/2017 1215   GFRNONAA 86 (L) 05/24/2013 1304   GFRAA >90 05/24/2013 1304    No results found for: TOTALPROTELP, ALBUMINELP, A1GS, A2GS, BETS, BETA2SER, GAMS, MSPIKE, SPEI  No results found for: Nils Pyle, [facility]  Lab Results  Component Value Date   WBC 7.7 06/14/2017   NEUTROABS 4.4 06/14/2017   HGB 13.5 06/14/2017   HCT 40.4 06/14/2017   MCV 95.0 06/14/2017   PLT 272 06/14/2017      Chemistry      Component Value Date/Time   NA 139 05/24/2017 1215   K 4.3 05/24/2017 1215   CL 105 10/20/2014 1405   CO2 24 05/24/2017 1215   BUN 13.6 05/24/2017 1215   CREATININE 0.7 05/24/2017 1215      Component Value Date/Time   CALCIUM 9.7 05/24/2017 1215   ALKPHOS 80 05/24/2017 1215   AST 22 05/24/2017 1215   ALT 31 05/24/2017 1215   BILITOT 0.43 05/24/2017 1215       No results found for: LABCA2  No components found for: [id number]  No results for input(s): INR in the last 168 hours.  Urinalysis    Component Value Date/Time   COLORURINE YELLOW 10/20/2014 1405   APPEARANCEUR CLEAR 10/20/2014 1405   LABSPEC >=1.030 (A) 10/20/2014 1405   PHURINE 5.5 10/20/2014 1405   GLUCOSEU NEGATIVE 10/20/2014 1405   HGBUR NEGATIVE 10/20/2014 1405   BILIRUBINUR SMALL (A) 10/20/2014 1405   KETONESUR TRACE (A) 10/20/2014 1405   PROTEINUR NEGATIVE 08/24/2009 0900   UROBILINOGEN 0.2 10/20/2014 1405   NITRITE NEGATIVE 10/20/2014 1405   LEUKOCYTESUR NEGATIVE 10/20/2014 1405     STUDIES: She will be due for repeat mammography, new baseline, November 2018  ELIGIBLE FOR AVAILABLE RESEARCH  PROTOCOL: No  ASSESSMENT: 76 y.o. [place] woman status post left breast upper outer quadrant biopsy 12/21/2016 for a invasive lobular carcinoma, grade 1, estrogen and progesterone receptor positive, with an MIB-1 of 15%, and HER-2 amplified (triple positive).  (1) left lumpectomy and sentinel lymph node sampling 01/25/2017 showed a pT1b pN0, stage IA invasive lobular breast cancer, grade 2, with negative margins  (2) anti-HER-2 immunotherapy to consist of trastuzumab for 6 months starting 03/01/2017  (a) echo 02/21/2017 showed an ejection fraction of 60-65%  (b) echocardiogram 05/02/2017 shows an ejection fraction in the 60-65% range.  (3) adjuvant radiation omitted as per the multidisciplinary clinic discussion 02/15/2017   (4) anastrozole started 02/15/2017  PLAN: Lilo is doing well today.  She will proceed with Trasutuzmab and continue taking Anastrozole.  It sounds like the Wellbutrin has improved things concerning her mood.  She will continue taking it daily.  I reviewed her last echo on 10/2 which was normal, in addition to her labs today which were also normal.  She will return in 3 weeks for labs and f/u with Dr. Jana Hakim, followed by her next Trastuzumab.    A total of (20) minutes of face-to-face time was spent with this patient with greater than 50% of that time in counseling and care-coordination.    Wilber Bihari, NP 06/14/17 12:08 PM Medical Oncology and Hematology [facility] [address] [place], [place] [address] Tel. [phone]    Fax. [phone]

## 2017-06-14 NOTE — Patient Instructions (Signed)
[facility] Discharge Instructions for Patients Receiving Chemotherapy Today you received the following chemotherapy agents:  Herceptin To help prevent nausea and vomiting after your treatment, we encourage you to take your nausea medication as prescribed.   If you develop nausea and vomiting that is not controlled by your nausea medication, call the clinic.   BELOW ARE SYMPTOMS THAT SHOULD BE REPORTED IMMEDIATELY:  *FEVER GREATER THAN 100.5 F  *CHILLS WITH OR WITHOUT FEVER  NAUSEA AND VOMITING THAT IS NOT CONTROLLED WITH YOUR NAUSEA MEDICATION  *UNUSUAL SHORTNESS OF BREATH  *UNUSUAL BRUISING OR BLEEDING  TENDERNESS IN MOUTH AND THROAT WITH OR WITHOUT PRESENCE OF ULCERS  *URINARY PROBLEMS  *BOWEL PROBLEMS  UNUSUAL RASH Items with * indicate a potential emergency and should be followed up as soon as possible.  Feel free to call the clinic should you have any questions or concerns. The clinic phone number is [phone].  Please show the CHEMO ALERT CARD at check-in to the Emergency Department and triage nurse.   

## 2017-06-15 ENCOUNTER — Encounter: Payer: Self-pay | Admitting: Oncology

## 2017-06-15 ENCOUNTER — Encounter: Payer: Self-pay | Admitting: *Deleted

## 2017-06-18 ENCOUNTER — Telehealth: Payer: Self-pay

## 2017-06-18 NOTE — Telephone Encounter (Signed)
aspoke with patient and she si aware of her added dr gm appt per 11/16 inbasket  Katelyne Galster

## 2017-06-19 ENCOUNTER — Ambulatory Visit: Payer: Medicare Other | Admitting: Adult Health

## 2017-06-19 ENCOUNTER — Other Ambulatory Visit: Payer: Medicare Other

## 2017-06-19 ENCOUNTER — Ambulatory Visit (INDEPENDENT_AMBULATORY_CARE_PROVIDER_SITE_OTHER): Payer: Medicare Other | Admitting: Gynecology

## 2017-06-19 ENCOUNTER — Ambulatory Visit: Payer: Medicare Other

## 2017-06-19 ENCOUNTER — Encounter: Payer: Self-pay | Admitting: Gynecology

## 2017-06-19 VITALS — BP 124/82 | Ht 64.0 in | Wt 196.0 lb

## 2017-06-19 DIAGNOSIS — C50412 Malignant neoplasm of upper-outer quadrant of left female breast: Secondary | ICD-10-CM

## 2017-06-19 DIAGNOSIS — Z01411 Encounter for gynecological examination (general) (routine) with abnormal findings: Secondary | ICD-10-CM | POA: Diagnosis not present

## 2017-06-19 DIAGNOSIS — Z9189 Other specified personal risk factors, not elsewhere classified: Secondary | ICD-10-CM

## 2017-06-19 DIAGNOSIS — Z853 Personal history of malignant neoplasm of breast: Secondary | ICD-10-CM

## 2017-06-19 DIAGNOSIS — Z170 Estrogen receptor positive status [ER+]: Secondary | ICD-10-CM | POA: Diagnosis not present

## 2017-06-19 DIAGNOSIS — N952 Postmenopausal atrophic vaginitis: Secondary | ICD-10-CM

## 2017-06-19 DIAGNOSIS — L900 Lichen sclerosus et atrophicus: Secondary | ICD-10-CM

## 2017-06-19 DIAGNOSIS — M8580 Other specified disorders of bone density and structure, unspecified site: Secondary | ICD-10-CM

## 2017-06-19 NOTE — Progress Notes (Signed)
    [name] Jan 10, 1941 [id number]        76 y.o.  G2P0011 for breast and pelvic exam.  Former patient of Dr. Toney Rakes.  Without gynecologic complaints.  Recent diagnosis of triple positive breast cancer this past year actively undergoing treatment.  Is on anastrozole and trastuzumab.  Past medical history,surgical history, problem list, medications, allergies, family history and social history were all reviewed and documented as reviewed in the EPIC chart.  ROS:  Performed with pertinent positives and negatives included in the history, assessment and plan.   Additional significant findings : None   Exam: Caryn Bee assistant Vitals:   06/19/17 1142  BP: 124/82  Weight: 196 lb (88.9 kg)  Height: 5\' 4"  (1.626 m)   Body mass index is 33.64 kg/m.  General appearance:  Normal affect, orientation and appearance. Skin: Grossly normal HEENT: Without gross lesions.  No cervical or supraclavicular adenopathy. Thyroid normal.  Lungs:  Clear without wheezing, rales or rhonchi Cardiac: RR, without RMG Abdominal:  Soft, nontender, without masses, guarding, rebound, organomegaly or hernia Breasts:  Examined lying and sitting.  Right without masses, retractions, discharge or axillary adenopathy.  Left status post lumpectomy with well-healed scar.  No masses, retractions, discharge or adenopathy.  Bilateral breast reduction scars noted Pelvic:  Ext, BUS, Vagina: With atrophic changes  Cervix: With atrophic changes  Adnexa: Without masses or tenderness    Anus and perineum: Normal   Rectovaginal: Normal sphincter tone without palpated masses or tenderness.    Assessment/Plan:  76 y.o. G69P0011 female for stent pelvic exam  1. Postmenopausal/atrophic genital changes.  Status post supracervical hysterectomy.  Doing well without significant hot flushes, night sweats, vaginal dryness or any bleeding. 2. History of lichen sclerosis per Dr. Sandrea Hughs note using clobetasol intermittently  as needed.  Will call if needs more.  Had been using vaginal estrogen per Dr. Sandrea Hughs note but patient has discontinued it.   3. Recent diagnosis and treatment for left breast cancer.  Actively being followed by oncology.  Exam NED.  Follow-up for mammography as they recommend. 4. Osteopenia.  DEXA 07/2015 T score -1.6.  Status post bilateral hip replacements.  Options for repeating now versus next year discussed.  Is on anastrozole which may accelerate bone loss.  We both agree at this point to let her get through the initial treatments of her breast cancer and will plan on DEXA next year as baseline. 5. Pap smear 06/2016.  No Pap smear done today.  Status post supracervical hysterectomy.  No history of abnormal Pap smears.  Options to stop screening per current screening guidelines reviewed.  Will readdress on an annual basis. 6. Colonoscopy 2008.  Due now and patient will make arrangements for this. 7. Health maintenance.  No routine lab work done as patient does this elsewhere.  Follow-up 1 year, sooner as needed.   [name] MD, 12:17 PM 06/19/2017

## 2017-06-19 NOTE — Patient Instructions (Signed)
Follow-up in 1 year for annual exam, sooner as needed. 

## 2017-06-29 ENCOUNTER — Encounter: Payer: Self-pay | Admitting: Gastroenterology

## 2017-06-29 ENCOUNTER — Ambulatory Visit (INDEPENDENT_AMBULATORY_CARE_PROVIDER_SITE_OTHER): Payer: Medicare Other | Admitting: Gastroenterology

## 2017-06-29 VITALS — BP 146/82 | HR 95 | Ht 64.0 in | Wt 197.0 lb

## 2017-06-29 DIAGNOSIS — Z1211 Encounter for screening for malignant neoplasm of colon: Secondary | ICD-10-CM | POA: Diagnosis not present

## 2017-06-29 DIAGNOSIS — K5730 Diverticulosis of large intestine without perforation or abscess without bleeding: Secondary | ICD-10-CM | POA: Diagnosis not present

## 2017-06-29 DIAGNOSIS — R194 Change in bowel habit: Secondary | ICD-10-CM

## 2017-06-29 NOTE — Progress Notes (Signed)
HPI :  76 y/o female with a history of breast cancer on anastrozole and herceptin, HLD, DJD, here to establish GI care and discuss colon cancer screening and bowel symptoms. She was previously seen by Dr. Olevia Perches, new to me.   History of breast cancer s/p left lumpectomy and sentinel lymph node sampling 01/25/2017 showed a pT1b pN0, stage IA invasive lobular breast cancer, grade 2, with negative margins. Now on anastrozole and herceptin. She thinks since starting anastrozole she has had some altered bowel habits. Sometimes she has constipation and other times she has loose stools. She has some intermittent left lower quadrant pain with this. She feels sometimes she feels "blocked". She's had a history of diverticulitis once in the past. She denies any blood in her stools. She has no family history of colon cancer.   Her last colonoscopy was in 2008. No polyps. She has severe left-sided diverticulosis.  We discussed in light of her age and comorbidities whether or not she wanted further colon cancer screening.  Colonoscopy 06/04/2007 - severe diverticulosis of left colon, no polyps Colonoscopy 06/29/2000 - diverticulosis of left colon, no polyps   Past Medical History:  Diagnosis Date  . Anxiety   . Breast cancer ([place])   . Degenerative disk disease   . Diverticulosis   . Hyperlipemia   . LBP (low back pain)   . Osteoarthritis   . PONV (postoperative nausea and vomiting)   . Vitamin B deficiency   . Vitamin D deficiency      Past Surgical History:  Procedure Laterality Date  . ABDOMINAL HYSTERECTOMY  2005   SUPRACERVICAL HYSTERECTOMY  . APPENDECTOMY    . BREAST BIOPSY Left 06/03/2013   Procedure: BREAST BIOPSY WITH NEEDLE LOCALIZATION;  Surgeon: [name], MD;  Location: [facility];  Service: General;  Laterality: Lef also lumpectomy;  . BREAST BIOPSY Left 12/21/2016  . BREAST LUMPECTOMY WITH RADIOACTIVE SEED AND SENTINEL LYMPH NODE BIOPSY Left 01/25/2017   Procedure: LEFT  BREAST LUMPECTOMY WITH RADIOACTIVE SEED AND SENTINEL LYMPH NODE BIOPSY;  Surgeon: Jovita Kussmaul, MD;  Location: [facility];  Service: General;  Laterality: Left;  . BREAST SURGERY  2000   breast reduction... BREAST BIOPSY ON OCTOBER 2014  . cataract surgery Bilateral   . CERVICAL FUSION  1999 & 2009   C3-4 [place]  . EYE SURGERY Left 2012   cataract  . JOINT REPLACEMENT    . SPINE SURGERY  1999&2009  . TONSILLECTOMY    . TOTAL HIP ARTHROPLASTY  (L) 2007 (R) 2011   [facility]   Family History  Problem Relation Age of Onset  . Arthritis Mother 55       polymyositis  . Polymyositis Mother   . Hypertension Father   . Heart disease Father   . Hypertension Brother   . Cancer Brother        Oral   Social History   Tobacco Use  . Smoking status: Former Smoker    Packs/day: 3.00    Years: 20.00    Pack years: 60.00    Last attempt to quit: 04/19/1985    Years since quitting: 32.2  . Smokeless tobacco: Never Used  Substance Use Topics  . Alcohol use: Yes    Comment: occasionally   . Drug use: No   Current Outpatient Medications  Medication Sig Dispense Refill  . Acetaminophen (TYLENOL ARTHRITIS PAIN PO) Take by mouth.    Marland Kitchen anastrozole (ARIMIDEX) 1 MG tablet Take 1 tablet (1  mg total) by mouth daily. 90 tablet 3  . Ascorbic Acid (VITAMIN C PO) Take by mouth daily.    . Biotin 1000 MCG tablet Take 2,000 mcg by mouth 3 (three) times daily.    Marland Kitchen buPROPion (WELLBUTRIN XL) 150 MG 24 hr tablet Take 1 tablet (150 mg total) by mouth daily. 30 tablet 6  . Cholecalciferol (VITAMIN D3) 1000 UNITS tablet Take 2,000 Units by mouth daily.      . diazepam (VALIUM) 5 MG tablet TAKE 1 TABLET TWICE A DAY AS NEEDED FOR ANXIETY OR MUSCLE SPASMS 60 tablet 1  . HYDROcodone-acetaminophen (NORCO/VICODIN) 5-325 MG tablet Take 1 tablet by mouth every 6 (six) hours as needed for moderate pain.    . Melatonin 10 MG TABS Take by mouth.    . Omega-3 Fatty Acids (FISH OIL) 1000 MG CAPS Take by  mouth.    . Trastuzumab (HERCEPTIN IV) Inject into the vein.    . vitamin B-12 (CYANOCOBALAMIN) 1000 MCG tablet Take 1,000 mcg by mouth daily.      . vitamin E 1000 UNIT capsule Take 1,000 Units by mouth daily.     No current facility-administered medications for this visit.    Allergies  Allergen Reactions  . Effexor Xr [Venlafaxine Hcl Er] Nausea Only    Vision problems, dizziness, light headedness.   . Augmentin [Amoxicillin-Pot Clavulanate] Diarrhea    diarrhea  . Cymbalta [Duloxetine Hcl] Diarrhea    diarrhea  . Ezetimibe-Simvastatin Other (See Comments)  . Phentermine Hcl Other (See Comments)     Review of Systems: All systems reviewed and negative except where noted in HPI.   Lab Results  Component Value Date   WBC 7.7 06/14/2017   HGB 13.5 06/14/2017   HCT 40.4 06/14/2017   MCV 95.0 06/14/2017   PLT 272 06/14/2017    Lab Results  Component Value Date   CREATININE 0.9 06/14/2017   BUN 15.7 06/14/2017   NA 138 06/14/2017   K 4.1 06/14/2017   CL 105 10/20/2014   CO2 25 06/14/2017    Lab Results  Component Value Date   ALT 28 06/14/2017   AST 19 06/14/2017   ALKPHOS 79 06/14/2017   BILITOT 0.43 06/14/2017     Physical Exam: BP (!) 146/82   Pulse 95   Ht 5\' 4"  (1.626 m)   Wt 197 lb (89.4 kg)   BMI 33.81 kg/m  Constitutional: Pleasant,well-developed, female in no acute distress. HEENT: Normocephalic and atraumatic. Conjunctivae are normal. No scleral icterus. Neck supple.  Cardiovascular: Normal rate, regular rhythm.  Pulmonary/chest: Effort normal and breath sounds normal. No wheezing, rales or rhonchi. Abdominal: Soft, nondistended, nontender.  There are no masses palpable. No hepatomegaly. Extremities: no edema Lymphadenopathy: No cervical adenopathy noted. Neurological: Alert and oriented to person place and time. Skin: Skin is warm and dry. No rashes noted. Psychiatric: Normal mood and affect. Behavior is normal.   ASSESSMENT AND  PLAN: 76 year old female with a history of breast cancer status post lumpectomy and now on anastrozole and Herceptin, in remission, here to discuss changes in bowel habits and colon cancer screening.  Altered bowel habits / diverticulosis - she thinks this started around the time she started anastrozole, and it could be related. She can otherwise be having symptomatic diverticular disease, given her severe diverticulosis. Recommend a daily fiber supplement such as Citrucel to take once a day to see if this can help regulate her bowels. She agreed with the plan.  Colon cancer screening - we  discussed given her age whether or not she wanted any further screening exams. Her cancer is in remission, she otherwise feels well and following discussion of the risks and benefits of optical colonoscopy, she strongly wished to proceed with colonoscopy. She will have this done in January after she has finished therapy with Herceptin.  All questions answered she agreed with the plan.  [place] Cellar, MD [facility] Pager [phone]  CC: Plotnikov, Evie Lacks, MD

## 2017-06-29 NOTE — Patient Instructions (Addendum)
If you are age 77 or older, your body mass index should be between 23-30. Your Body mass index is 33.81 kg/m. If this is out of the aforementioned range listed, please consider follow up with your Primary Care Provider.  If you are age 51 or younger, your body mass index should be between 19-25. Your Body mass index is 33.81 kg/m. If this is out of the aformentioned range listed, please consider follow up with your Primary Care Provider.   You have been scheduled for a colonoscopy. Please follow written instructions given to you at your visit today.  Please pick up your prep supplies at the pharmacy within the next 1-3 days. If you use inhalers (even only as needed), please bring them with you on the day of your procedure. Your physician has requested that you go to www.startemmi.com and enter the access code given to you at your visit today. This web site gives a general overview about your procedure. However, you should still follow specific instructions given to you by our office regarding your preparation for the procedure.  We have given you a sample of Plenvu today for your procedure prep.  Please start taking Citrucel daily.  Thank you.

## 2017-07-10 ENCOUNTER — Ambulatory Visit: Payer: Medicare Other

## 2017-07-10 ENCOUNTER — Other Ambulatory Visit: Payer: Medicare Other

## 2017-07-10 ENCOUNTER — Ambulatory Visit: Payer: Medicare Other | Admitting: Oncology

## 2017-07-11 ENCOUNTER — Telehealth: Payer: Self-pay | Admitting: Oncology

## 2017-07-11 NOTE — Telephone Encounter (Signed)
Spoke with patient re new appointments for 12/13 and 1/3. Per [facility] due to weather and center closed 12/10 patient to have lab/inf only this week (12/13) and lab/fu/infusion 1st week in January (1/3). Lab/infusion for 12/31 cxd - new appointment 12/31.

## 2017-07-12 ENCOUNTER — Ambulatory Visit: Payer: Medicare Other | Admitting: Internal Medicine

## 2017-07-13 ENCOUNTER — Ambulatory Visit (HOSPITAL_BASED_OUTPATIENT_CLINIC_OR_DEPARTMENT_OTHER): Payer: Medicare Other

## 2017-07-13 ENCOUNTER — Other Ambulatory Visit: Payer: Self-pay | Admitting: Oncology

## 2017-07-13 ENCOUNTER — Other Ambulatory Visit (HOSPITAL_BASED_OUTPATIENT_CLINIC_OR_DEPARTMENT_OTHER): Payer: Medicare Other

## 2017-07-13 VITALS — BP 154/76 | HR 88 | Temp 97.6°F | Resp 18

## 2017-07-13 DIAGNOSIS — C50412 Malignant neoplasm of upper-outer quadrant of left female breast: Secondary | ICD-10-CM

## 2017-07-13 DIAGNOSIS — Z170 Estrogen receptor positive status [ER+]: Principal | ICD-10-CM

## 2017-07-13 DIAGNOSIS — Z5112 Encounter for antineoplastic immunotherapy: Secondary | ICD-10-CM

## 2017-07-13 LAB — CBC WITH DIFFERENTIAL/PLATELET
BASO%: 1.2 % (ref 0.0–2.0)
Basophils Absolute: 0.1 10*3/uL (ref 0.0–0.1)
EOS%: 1.2 % (ref 0.0–7.0)
Eosinophils Absolute: 0.1 10*3/uL (ref 0.0–0.5)
HCT: 41 % (ref 34.8–46.6)
HGB: 13.9 g/dL (ref 11.6–15.9)
LYMPH%: 27.9 % (ref 14.0–49.7)
MCH: 31.9 pg (ref 25.1–34.0)
MCHC: 33.8 g/dL (ref 31.5–36.0)
MCV: 94.2 fL (ref 79.5–101.0)
MONO#: 0.8 10*3/uL (ref 0.1–0.9)
MONO%: 10.7 % (ref 0.0–14.0)
NEUT#: 4.6 10*3/uL (ref 1.5–6.5)
NEUT%: 59 % (ref 38.4–76.8)
Platelets: 296 10*3/uL (ref 145–400)
RBC: 4.35 10*6/uL (ref 3.70–5.45)
RDW: 13.6 % (ref 11.2–14.5)
WBC: 7.8 10*3/uL (ref 3.9–10.3)
lymph#: 2.2 10*3/uL (ref 0.9–3.3)

## 2017-07-13 LAB — COMPREHENSIVE METABOLIC PANEL
ALT: 26 U/L (ref 0–55)
AST: 20 U/L (ref 5–34)
Albumin: 4 g/dL (ref 3.5–5.0)
Alkaline Phosphatase: 85 U/L (ref 40–150)
Anion Gap: 11 mEq/L (ref 3–11)
BUN: 22.6 mg/dL (ref 7.0–26.0)
CO2: 20 mEq/L — ABNORMAL LOW (ref 22–29)
Calcium: 9.8 mg/dL (ref 8.4–10.4)
Chloride: 105 mEq/L (ref 98–109)
Creatinine: 0.9 mg/dL (ref 0.6–1.1)
EGFR: >60 mL/min/{1.73_m2} (ref >60)
Glucose: 99 mg/dl (ref 70–140)
Potassium: 4.3 mEq/L (ref 3.5–5.1)
Sodium: 136 mEq/L (ref 136–145)
Total Bilirubin: 0.46 mg/dL (ref 0.20–1.20)
Total Protein: 7.4 g/dL (ref 6.4–8.3)

## 2017-07-13 MED ORDER — FAMOTIDINE IN NACL 20-0.9 MG/50ML-% IV SOLN
INTRAVENOUS | Status: AC
  Filled 2017-07-13: qty 50

## 2017-07-13 MED ORDER — ACETAMINOPHEN 325 MG PO TABS
650.0000 mg | ORAL_TABLET | Freq: Once | ORAL | Status: AC
  Administered 2017-07-13: 650 mg via ORAL

## 2017-07-13 MED ORDER — SODIUM CHLORIDE 0.9 % IV SOLN
Freq: Once | INTRAVENOUS | Status: AC
  Administered 2017-07-13: 15:00:00 via INTRAVENOUS

## 2017-07-13 MED ORDER — DIPHENHYDRAMINE HCL 25 MG PO CAPS
25.0000 mg | ORAL_CAPSULE | Freq: Once | ORAL | Status: AC
  Administered 2017-07-13: 25 mg via ORAL

## 2017-07-13 MED ORDER — FAMOTIDINE IN NACL 20-0.9 MG/50ML-% IV SOLN
20.0000 mg | Freq: Once | INTRAVENOUS | Status: AC
  Administered 2017-07-13: 20 mg via INTRAVENOUS

## 2017-07-13 MED ORDER — ACETAMINOPHEN 325 MG PO TABS
ORAL_TABLET | ORAL | Status: AC
  Filled 2017-07-13: qty 2

## 2017-07-13 MED ORDER — TRASTUZUMAB CHEMO 150 MG IV SOLR
6.0000 mg/kg | Freq: Once | INTRAVENOUS | Status: AC
  Administered 2017-07-13: 525 mg via INTRAVENOUS
  Filled 2017-07-13: qty 25

## 2017-07-13 MED ORDER — DIPHENHYDRAMINE HCL 25 MG PO CAPS
ORAL_CAPSULE | ORAL | Status: AC
  Filled 2017-07-13: qty 1

## 2017-07-13 MED ORDER — DEXAMETHASONE SODIUM PHOSPHATE 10 MG/ML IJ SOLN
INTRAMUSCULAR | Status: AC
  Filled 2017-07-13: qty 1

## 2017-07-13 MED ORDER — DEXAMETHASONE SODIUM PHOSPHATE 10 MG/ML IJ SOLN
10.0000 mg | Freq: Once | INTRAMUSCULAR | Status: AC
  Administered 2017-07-13: 10 mg via INTRAVENOUS

## 2017-07-13 NOTE — Patient Instructions (Addendum)
[facility] Discharge Instructions for Patients Receiving Chemotherapy  Today you received the following chemotherapy agents: trastuzumab (Herceptin).  To help prevent nausea and vomiting after your treatment, we encourage you to take your nausea medication as prescribed.  If you develop nausea and vomiting that is not controlled by your nausea medication, call the clinic.   BELOW ARE SYMPTOMS THAT SHOULD BE REPORTED IMMEDIATELY:  *FEVER GREATER THAN 100.5 F  *CHILLS WITH OR WITHOUT FEVER  NAUSEA AND VOMITING THAT IS NOT CONTROLLED WITH YOUR NAUSEA MEDICATION  *UNUSUAL SHORTNESS OF BREATH  *UNUSUAL BRUISING OR BLEEDING  TENDERNESS IN MOUTH AND THROAT WITH OR WITHOUT PRESENCE OF ULCERS  *URINARY PROBLEMS  *BOWEL PROBLEMS  UNUSUAL RASH Items with * indicate a potential emergency and should be followed up as soon as possible.  Feel free to call the clinic should you have any questions or concerns. The clinic phone number is [phone].  Please show the CHEMO ALERT CARD at check-in to the Emergency Department and triage nurse.   

## 2017-07-14 ENCOUNTER — Encounter: Payer: Self-pay | Admitting: Oncology

## 2017-07-14 ENCOUNTER — Telehealth: Payer: Self-pay | Admitting: Oncology

## 2017-07-14 NOTE — Telephone Encounter (Signed)
Spoke to patient regarding upcoming December and January appointments per 12/12 sch message

## 2017-07-21 ENCOUNTER — Other Ambulatory Visit: Payer: Medicare Other

## 2017-07-21 ENCOUNTER — Ambulatory Visit: Payer: Medicare Other

## 2017-07-24 ENCOUNTER — Telehealth: Payer: Self-pay | Admitting: *Deleted

## 2017-07-24 NOTE — Telephone Encounter (Signed)
This RN spoke with pt per her call stating she developed swelling in her gum - was seen today by an Endodontist " who prescribed an antibiotic - and I need to know if that is ok to take with my current medication ( treatment plan ) "  Informed pt above is appropriate.  [name] states she may need dental surgery in the next few weeks - " but will let you know "  This RN informed pt her current treatment plan does not interfere with her lab work per concern for dental surgery.  No further questions.

## 2017-07-31 ENCOUNTER — Other Ambulatory Visit: Payer: Medicare Other

## 2017-07-31 ENCOUNTER — Other Ambulatory Visit (HOSPITAL_BASED_OUTPATIENT_CLINIC_OR_DEPARTMENT_OTHER): Payer: Medicare Other

## 2017-07-31 ENCOUNTER — Ambulatory Visit (HOSPITAL_BASED_OUTPATIENT_CLINIC_OR_DEPARTMENT_OTHER): Payer: Medicare Other

## 2017-07-31 ENCOUNTER — Ambulatory Visit: Payer: Medicare Other

## 2017-07-31 VITALS — BP 172/75 | HR 86 | Temp 97.4°F | Resp 16

## 2017-07-31 DIAGNOSIS — Z170 Estrogen receptor positive status [ER+]: Principal | ICD-10-CM

## 2017-07-31 DIAGNOSIS — Z5112 Encounter for antineoplastic immunotherapy: Secondary | ICD-10-CM | POA: Diagnosis present

## 2017-07-31 DIAGNOSIS — C50412 Malignant neoplasm of upper-outer quadrant of left female breast: Secondary | ICD-10-CM | POA: Diagnosis present

## 2017-07-31 LAB — CBC WITH DIFFERENTIAL/PLATELET
BASO%: 0.3 % (ref 0.0–2.0)
Basophils Absolute: 0 10*3/uL (ref 0.0–0.1)
EOS%: 1.7 % (ref 0.0–7.0)
Eosinophils Absolute: 0.1 10*3/uL (ref 0.0–0.5)
HCT: 43.8 % (ref 34.8–46.6)
HGB: 14.4 g/dL (ref 11.6–15.9)
LYMPH%: 28.4 % (ref 14.0–49.7)
MCH: 32.1 pg (ref 25.1–34.0)
MCHC: 32.9 g/dL (ref 31.5–36.0)
MCV: 97.6 fL (ref 79.5–101.0)
MONO#: 0.8 10*3/uL (ref 0.1–0.9)
MONO%: 11.3 % (ref 0.0–14.0)
NEUT#: 4.2 10*3/uL (ref 1.5–6.5)
NEUT%: 58.3 % (ref 38.4–76.8)
Platelets: 273 10*3/uL (ref 145–400)
RBC: 4.49 10*6/uL (ref 3.70–5.45)
RDW: 13.3 % (ref 11.2–14.5)
WBC: 7.2 10*3/uL (ref 3.9–10.3)
lymph#: 2.1 10*3/uL (ref 0.9–3.3)

## 2017-07-31 LAB — COMPREHENSIVE METABOLIC PANEL
ALT: 34 U/L (ref 0–55)
AST: 23 U/L (ref 5–34)
Albumin: 3.9 g/dL (ref 3.5–5.0)
Alkaline Phosphatase: 93 U/L (ref 40–150)
Anion Gap: 9 mEq/L (ref 3–11)
BUN: 11.1 mg/dL (ref 7.0–26.0)
CO2: 26 mEq/L (ref 22–29)
Calcium: 9.6 mg/dL (ref 8.4–10.4)
Chloride: 105 mEq/L (ref 98–109)
Creatinine: 0.8 mg/dL (ref 0.6–1.1)
EGFR: >60 mL/min/{1.73_m2} (ref >60)
Glucose: 90 mg/dl (ref 70–140)
Potassium: 4.2 mEq/L (ref 3.5–5.1)
Sodium: 139 mEq/L (ref 136–145)
Total Bilirubin: 0.34 mg/dL (ref 0.20–1.20)
Total Protein: 7.3 g/dL (ref 6.4–8.3)

## 2017-07-31 MED ORDER — DEXAMETHASONE SODIUM PHOSPHATE 10 MG/ML IJ SOLN
INTRAMUSCULAR | Status: AC
  Filled 2017-07-31: qty 1

## 2017-07-31 MED ORDER — DIPHENHYDRAMINE HCL 25 MG PO CAPS
25.0000 mg | ORAL_CAPSULE | Freq: Once | ORAL | Status: AC
  Administered 2017-07-31: 25 mg via ORAL

## 2017-07-31 MED ORDER — ACETAMINOPHEN 325 MG PO TABS
ORAL_TABLET | ORAL | Status: AC
  Filled 2017-07-31: qty 2

## 2017-07-31 MED ORDER — FAMOTIDINE IN NACL 20-0.9 MG/50ML-% IV SOLN
INTRAVENOUS | Status: AC
Start: 2017-07-31 — End: ?
  Filled 2017-07-31: qty 50

## 2017-07-31 MED ORDER — DIPHENHYDRAMINE HCL 25 MG PO CAPS
ORAL_CAPSULE | ORAL | Status: AC
  Filled 2017-07-31: qty 1

## 2017-07-31 MED ORDER — FAMOTIDINE IN NACL 20-0.9 MG/50ML-% IV SOLN
20.0000 mg | Freq: Once | INTRAVENOUS | Status: AC
  Administered 2017-07-31: 20 mg via INTRAVENOUS

## 2017-07-31 MED ORDER — TRASTUZUMAB CHEMO 150 MG IV SOLR
6.0000 mg/kg | Freq: Once | INTRAVENOUS | Status: AC
  Administered 2017-07-31: 525 mg via INTRAVENOUS
  Filled 2017-07-31: qty 25

## 2017-07-31 MED ORDER — DEXAMETHASONE SODIUM PHOSPHATE 10 MG/ML IJ SOLN
10.0000 mg | Freq: Once | INTRAMUSCULAR | Status: AC
  Administered 2017-07-31: 10 mg via INTRAVENOUS

## 2017-07-31 MED ORDER — ACETAMINOPHEN 325 MG PO TABS
650.0000 mg | ORAL_TABLET | Freq: Once | ORAL | Status: AC
  Administered 2017-07-31: 650 mg via ORAL

## 2017-07-31 MED ORDER — SODIUM CHLORIDE 0.9 % IV SOLN
Freq: Once | INTRAVENOUS | Status: AC
  Administered 2017-07-31: 15:00:00 via INTRAVENOUS

## 2017-07-31 NOTE — Progress Notes (Signed)
Patient reports change in medication- patient is taking 875mg  Augmentin BID for a tooth infection.

## 2017-07-31 NOTE — Patient Instructions (Signed)
[facility] Discharge Instructions for Patients Receiving Chemotherapy  Today you received the following chemotherapy agents:  Herceptin, (trastuzumab)  To help prevent nausea and vomiting after your treatment, we encourage you to take your nausea medication as prescribed.    If you develop nausea and vomiting that is not controlled by your nausea medication, call the clinic.   BELOW ARE SYMPTOMS THAT SHOULD BE REPORTED IMMEDIATELY:  *FEVER GREATER THAN 100.5 F  *CHILLS WITH OR WITHOUT FEVER  NAUSEA AND VOMITING THAT IS NOT CONTROLLED WITH YOUR NAUSEA MEDICATION  *UNUSUAL SHORTNESS OF BREATH  *UNUSUAL BRUISING OR BLEEDING  TENDERNESS IN MOUTH AND THROAT WITH OR WITHOUT PRESENCE OF ULCERS  *URINARY PROBLEMS  *BOWEL PROBLEMS  UNUSUAL RASH Items with * indicate a potential emergency and should be followed up as soon as possible.  Feel free to call the clinic you have any questions or concerns. The clinic phone number is [phone].  Please show the [place] at check-in to the Emergency Department and triage nurse.

## 2017-08-03 ENCOUNTER — Ambulatory Visit: Payer: Medicare Other

## 2017-08-03 ENCOUNTER — Ambulatory Visit: Payer: Medicare Other | Admitting: Adult Health

## 2017-08-03 ENCOUNTER — Other Ambulatory Visit: Payer: Medicare Other

## 2017-08-04 ENCOUNTER — Ambulatory Visit (INDEPENDENT_AMBULATORY_CARE_PROVIDER_SITE_OTHER): Payer: Medicare Other | Admitting: Internal Medicine

## 2017-08-04 ENCOUNTER — Encounter: Payer: Self-pay | Admitting: Internal Medicine

## 2017-08-04 DIAGNOSIS — M5416 Radiculopathy, lumbar region: Secondary | ICD-10-CM

## 2017-08-04 DIAGNOSIS — F411 Generalized anxiety disorder: Secondary | ICD-10-CM | POA: Diagnosis not present

## 2017-08-04 DIAGNOSIS — F4323 Adjustment disorder with mixed anxiety and depressed mood: Secondary | ICD-10-CM | POA: Diagnosis not present

## 2017-08-04 DIAGNOSIS — E559 Vitamin D deficiency, unspecified: Secondary | ICD-10-CM

## 2017-08-04 DIAGNOSIS — C50412 Malignant neoplasm of upper-outer quadrant of left female breast: Secondary | ICD-10-CM

## 2017-08-04 DIAGNOSIS — E538 Deficiency of other specified B group vitamins: Secondary | ICD-10-CM | POA: Diagnosis not present

## 2017-08-04 DIAGNOSIS — Z170 Estrogen receptor positive status [ER+]: Secondary | ICD-10-CM

## 2017-08-04 DIAGNOSIS — D518 Other vitamin B12 deficiency anemias: Secondary | ICD-10-CM

## 2017-08-04 DIAGNOSIS — G4700 Insomnia, unspecified: Secondary | ICD-10-CM

## 2017-08-04 MED ORDER — ZOLPIDEM TARTRATE 5 MG PO TABS: 5.0000 mg | ORAL_TABLET | Freq: Every evening | ORAL | 3 refills | Status: DC | PRN

## 2017-08-04 NOTE — Assessment & Plan Note (Signed)
On B12 

## 2017-08-04 NOTE — Assessment & Plan Note (Signed)
Pt declined anti-depressants Norco prn  Potential benefits of a long term opioids use as well as potential risks (i.e. addiction risk, apnea etc) and complications (i.e. Somnolence, constipation and others) were explained to the patient and were aknowledged.

## 2017-08-04 NOTE — Assessment & Plan Note (Signed)
H/o injections

## 2017-08-04 NOTE — Assessment & Plan Note (Signed)
On Herceptin IV and Arimidex

## 2017-08-04 NOTE — Assessment & Plan Note (Signed)
Zolpidem low dose

## 2017-08-04 NOTE — Assessment & Plan Note (Signed)
Vit D 

## 2017-08-04 NOTE — Progress Notes (Signed)
Subjective:  Patient ID: [name], female    DOB: 08-05-1940  Age: 77 y.o. MRN: [id number]  CC: No chief complaint on file.   HPI [name] presents for breast ca on Arimidex/Herceptin IV, LBP, OA, anxiety f/u. C/o insomnia  Outpatient Medications Prior to Visit  Medication Sig Dispense Refill  . Acetaminophen (TYLENOL ARTHRITIS PAIN PO) Take by mouth.    Marland Kitchen anastrozole (ARIMIDEX) 1 MG tablet Take 1 tablet (1 mg total) by mouth daily. 90 tablet 3  . Ascorbic Acid (VITAMIN C PO) Take by mouth daily.    . Biotin 1000 MCG tablet Take 2,000 mcg by mouth 3 (three) times daily.    Marland Kitchen buPROPion (WELLBUTRIN XL) 150 MG 24 hr tablet Take 1 tablet (150 mg total) by mouth daily. 30 tablet 6  . Cholecalciferol (VITAMIN D3) 1000 UNITS tablet Take 2,000 Units by mouth daily.      . diazepam (VALIUM) 5 MG tablet TAKE 1 TABLET TWICE A DAY AS NEEDED FOR ANXIETY OR MUSCLE SPASMS 60 tablet 1  . HYDROcodone-acetaminophen (NORCO/VICODIN) 5-325 MG tablet Take 1 tablet by mouth every 6 (six) hours as needed for moderate pain.    . Melatonin 10 MG TABS Take by mouth.    . Omega-3 Fatty Acids (FISH OIL) 1000 MG CAPS Take by mouth.    . Trastuzumab (HERCEPTIN IV) Inject into the vein.    . vitamin B-12 (CYANOCOBALAMIN) 1000 MCG tablet Take 1,000 mcg by mouth daily.      . vitamin E 1000 UNIT capsule Take 1,000 Units by mouth daily.    Marland Kitchen zolpidem (AMBIEN) 10 MG tablet 3 mg nightly as needed for Sleep.     No facility-administered medications prior to visit.     ROS Review of Systems  Constitutional: Positive for fatigue. Negative for activity change, appetite change, chills and unexpected weight change.  HENT: Negative for congestion, mouth sores and sinus pressure.   Eyes: Negative for visual disturbance.  Respiratory: Negative for cough and chest tightness.   Gastrointestinal: Negative for abdominal pain and nausea.  Genitourinary: Negative for difficulty urinating, frequency and vaginal  pain.  Musculoskeletal: Positive for arthralgias, back pain, gait problem and myalgias.  Skin: Negative for pallor and rash.  Neurological: Negative for dizziness, tremors, weakness, numbness and headaches.  Psychiatric/Behavioral: Positive for sleep disturbance. Negative for confusion and suicidal ideas. The patient is nervous/anxious.     Objective:  BP 138/82 (BP Location: Right Arm, Patient Position: Sitting, Cuff Size: Large)   Pulse 82   Temp 97.7 F (36.5 C) (Oral)   Ht 5\' 4"  (1.626 m)   Wt 198 lb (89.8 kg)   SpO2 96%   BMI 33.99 kg/m   BP Readings from Last 3 Encounters:  08/04/17 138/82  07/31/17 (!) 172/75  07/13/17 (!) 154/76    Wt Readings from Last 3 Encounters:  08/04/17 198 lb (89.8 kg)  06/29/17 197 lb (89.4 kg)  06/19/17 196 lb (88.9 kg)    Physical Exam  Constitutional: She appears well-developed. No distress.  HENT:  Head: Normocephalic.  Right Ear: External ear normal.  Left Ear: External ear normal.  Nose: Nose normal.  Mouth/Throat: Oropharynx is clear and moist.  Eyes: Conjunctivae are normal. Pupils are equal, round, and reactive to light. Right eye exhibits no discharge. Left eye exhibits no discharge.  Neck: Normal range of motion. Neck supple. No JVD present. No tracheal deviation present. No thyromegaly present.  Cardiovascular: Normal rate, regular rhythm and normal heart  sounds.  Pulmonary/Chest: No stridor. No respiratory distress. She has no wheezes.  Abdominal: Soft. Bowel sounds are normal. She exhibits no distension and no mass. There is no tenderness. There is no rebound and no guarding.  Musculoskeletal: She exhibits tenderness. She exhibits no edema.  Lymphadenopathy:    She has no cervical adenopathy.  Neurological: She displays normal reflexes. No cranial nerve deficit. She exhibits normal muscle tone. Coordination normal.  Skin: No rash noted. No erythema.  Psychiatric: She has a normal mood and affect. Her behavior is normal.  Judgment and thought content normal.    Lab Results  Component Value Date   WBC 7.2 07/31/2017   HGB 14.4 07/31/2017   HCT 43.8 07/31/2017   PLT 273 07/31/2017   GLUCOSE 90 07/31/2017   CHOL 221 (H) 10/20/2014   TRIG 140.0 10/20/2014   HDL 54.00 10/20/2014   LDLDIRECT 161.3 06/17/2011   LDLCALC 139 (H) 10/20/2014   ALT 34 07/31/2017   AST 23 07/31/2017   NA 139 07/31/2017   K 4.2 07/31/2017   CL 105 10/20/2014   CREATININE 0.8 07/31/2017   BUN 11.1 07/31/2017   CO2 26 07/31/2017   TSH 2.10 10/20/2014   INR 1.76 (H) 08/29/2009    No results found.  Assessment & Plan:   There are no diagnoses linked to this encounter. I am having [name] maintain her vitamin B-12, cholecalciferol, Biotin, Ascorbic Acid (VITAMIN C PO), Acetaminophen (TYLENOL ARTHRITIS PAIN PO), vitamin E, anastrozole, Fish Oil, Melatonin, HYDROcodone-acetaminophen, diazepam, buPROPion, Trastuzumab (HERCEPTIN IV), and zolpidem.  No orders of the defined types were placed in this encounter.    Follow-up: No Follow-up on file.  [name], MD

## 2017-08-04 NOTE — Assessment & Plan Note (Signed)
Wellbutrin

## 2017-08-17 NOTE — Progress Notes (Signed)
[name]  Telephone:([phone]) [phone] Fax:(336) [phone]     ID: [name] DOB: 06-24-1941  MR#: [id number]  HUT#:[id number]  Patient Care Team: Cassandria Anger, MD as PCP - General Terrance Mass, MD (Obstetrics and Gynecology) Kristeen Miss, MD as Attending Physician (Neurosurgery) Jovita Kussmaul, MD as Consulting Physician (General Surgery) Rolm Bookbinder, MD as Consulting Physician (Dermatology) Bensimhon, Shaune Pascal, MD as Consulting Physician (Cardiology) OTHER MD:  CHIEF COMPLAINT: Triple positive breast cancer  CURRENT TREATMENT: Anastrozole  BREAST CANCER HISTORY: From the original intake note:  The patient had bilateral screening mammography at [facility] 06/13/2016 showing a 5 mm mass in the left breast upper outer quadrant middle depth. This was felt to be most likely fat necrosis but additional imaging with ultrasonography was obtained the same day and confirmed a 0 point centimeter round oil cyst in the left breast at the 1:00 anterior depth. This was felt to be most likely benign but six--month follow-up diagnostic mammography of the left breast with tomography was recommended and performed 12/20/2016. The breast density was category A.. The oval mass in the left breast upper outer quadrant had increased in size.  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  The patient has met with my partner Dr. Burr Medico and is here today as a second opinion regarding how to proceed   INTERVAL HISTORY: [name] returns today for follow-up and treatment of her estrogen receptor positive breast cancer accompanied by her daughter. She continues on anastrozole with good tolerance. She denies issues with  hot flashes or vaginal dryness. She notes that she has some arthralgias and myalgias in her lower back and right side with she aids with Norco and tylenol. She reports that this gives her occasional constipation which she aids with stool softeners and probiotics on the nights that she has trouble. She notes that [facility] is no longer within her health insurance network and was charged $60 for a 3 month supply instead of her previous $29. She notes that she would like her prescription to go to [facility].   She also continues on trastuzumab every 21 days, with a dose due today. Her most recent echo 05/02/2017 showed an ejection fraction of 60-65%. She tolerates this well. She notes fatigue after the 1st night.  REVIEW OF SYSTEMS: [name] reports that she stayed in town for the holidays, and they weren't very stressful. She notes that she played Bridge and Gin-Rummy with her friends last week. She notes that they go out to eat occasionally. She reports some memory loss, forgetfulness, and a loss for words. She notes that she hasn't been sleeping well. She notes that at night, her thoughts race through her mind. She notes that she takes bupropion and has not noticed a difference in the way she feels. She notes that she is scheduled for a colonoscopy on 08/31/2017. She denies unusual headaches, visual changes, nausea, vomiting, or dizziness. There has been no unusual cough, phlegm production, or pleurisy. This been no change in bowel or bladder habits. She denies unexplained fatigue or unexplained weight loss, bleeding, rash, or fever. A detailed review of systems was otherwise stable.    PAST MEDICAL HISTORY: Past Medical History:  Diagnosis Date  . Anxiety   .  Breast cancer ([place])   . Degenerative disk disease   . Diverticulosis   . Hyperlipemia   . LBP (low back pain)   . Osteoarthritis   . PONV (postoperative nausea and vomiting)   . Vitamin B deficiency   . Vitamin D deficiency     PAST SURGICAL  HISTORY: Past Surgical History:  Procedure Laterality Date  . ABDOMINAL HYSTERECTOMY  2005   SUPRACERVICAL HYSTERECTOMY  . APPENDECTOMY    . BREAST BIOPSY Left 06/03/2013   Procedure: BREAST BIOPSY WITH NEEDLE LOCALIZATION;  Surgeon: [name], MD;  Location: [facility];  Service: General;  Laterality: Lef also lumpectomy;  . BREAST BIOPSY Left 12/21/2016  . BREAST LUMPECTOMY WITH RADIOACTIVE SEED AND SENTINEL LYMPH NODE BIOPSY Left 01/25/2017   Procedure: LEFT BREAST LUMPECTOMY WITH RADIOACTIVE SEED AND SENTINEL LYMPH NODE BIOPSY;  Surgeon: Jovita Kussmaul, MD;  Location: [facility];  Service: General;  Laterality: Left;  . BREAST SURGERY  2000   breast reduction... BREAST BIOPSY ON OCTOBER 2014  . cataract surgery Bilateral   . CERVICAL FUSION  1999 & 2009   C3-4 [place]  . EYE SURGERY Left 2012   cataract  . JOINT REPLACEMENT    . SPINE SURGERY  1999&2009  . TONSILLECTOMY    . TOTAL HIP ARTHROPLASTY  (L) 2007 (R) 2011   [facility]    FAMILY HISTORY Family History  Problem Relation Age of Onset  . Arthritis Mother 78       polymyositis  . Polymyositis Mother   . Hypertension Father   . Heart disease Father   . Hypertension Brother   . Cancer Brother        Oral  She does not meet criteria for genetics testing   GYNECOLOGIC HISTORY:  No LMP recorded. Patient has had a hysterectomy. Menarche age 6, first live birth age 44, she is [place] P1. She stopped having periods sometime in her early 63s. She did not use hormone replacement but did use some vaginal cream at times after menopause. 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 Her first marriage lasted only 2 years. She had no children from that marriage. Her second husband died from an aneurysm or than 30 years ago. That second marriage produced her son [name], 42 years old as of June 2018. He works for a Careers information officer. He is recently separated from his wife in [place] and is living with the patient temporarily. The patient does have 2 grandchildren. She attends [employer]    ADVANCED DIRECTIVES: Not in place. At the 01/11/2017 visit the patient tells me she intends to name her son as her healthcare part of attorney   HEALTH MAINTENANCE: Social History   Tobacco Use  . Smoking status: Former Smoker    Packs/day: 3.00    Years: 20.00    Pack years: 60.00    Last attempt to quit: 04/19/1985    Years since quitting: 32.3  . Smokeless tobacco: Never Used  Substance Use Topics  . Alcohol use: Yes    Comment: occasionally   . Drug use: No     Colonoscopy:November 2008  PAP:  Bone density: 07/16/2015 showed osteopenia   Allergies  Allergen Reactions  . Effexor Xr [Venlafaxine Hcl Er] Nausea Only    Vision problems, dizziness, light headedness.   . Augmentin [Amoxicillin-Pot Clavulanate] Diarrhea  diarrhea  . Cymbalta [Duloxetine Hcl] Diarrhea    diarrhea  . Ezetimibe-Simvastatin Other (See Comments)  . Phentermine Hcl Other (See Comments)    Current Outpatient Medications  Medication Sig Dispense Refill  . Acetaminophen (TYLENOL ARTHRITIS PAIN PO) Take by mouth.    Marland Kitchen anastrozole (ARIMIDEX) 1 MG tablet Take 1 tablet (1 mg total) by mouth daily. 90 tablet 3  . Ascorbic Acid (VITAMIN C PO) Take by mouth daily.    . Biotin 1000 MCG tablet Take 2,000 mcg by mouth 3 (three) times daily.    Marland Kitchen buPROPion (WELLBUTRIN XL) 150 MG 24 hr tablet Take 1 tablet (150 mg total) by mouth daily. 90 tablet 6  . Cholecalciferol (VITAMIN D3) 1000 UNITS tablet Take 2,000 Units by mouth daily.      . diazepam (VALIUM) 5 MG tablet TAKE 1 TABLET TWICE A DAY AS NEEDED FOR ANXIETY OR MUSCLE SPASMS 60 tablet 1  . HYDROcodone-acetaminophen (NORCO/VICODIN) 5-325 MG tablet Take 1 tablet by mouth every 6 (six) hours as needed for moderate pain.    . Melatonin 10 MG TABS Take by mouth.    .  Omega-3 Fatty Acids (FISH OIL) 1000 MG CAPS Take by mouth.    . Trastuzumab (HERCEPTIN IV) Inject into the vein.    . vitamin B-12 (CYANOCOBALAMIN) 1000 MCG tablet Take 1,000 mcg by mouth daily.      . vitamin E 1000 UNIT capsule Take 1,000 Units by mouth daily.    Marland Kitchen zolpidem (AMBIEN) 5 MG tablet Take 1 tablet (5 mg total) by mouth at bedtime as needed for sleep. 30 tablet 3   No current facility-administered medications for this visit.     OBJECTIVE: Older white woman who appears well  Vitals:   08/21/17 1204  BP: (!) 166/82  Pulse: 91  Resp: 18  Temp: 98.3 F (36.8 C)  SpO2: 98%     Body mass index is 33.9 kg/m.    ECOG FS:0 - Asymptomatic  Sclerae unicteric, EOMs intact Oropharynx clear and moist No cervical or supraclavicular adenopathy Lungs no rales or rhonchi Heart regular rate and rhythm Abd soft, nontender, positive bowel sounds MSK no focal spinal tenderness, no upper extremity lymphedema Neuro: nonfocal, well oriented, pressure of speech, anxious but friendly affect Breasts: The right breast is unremarkable.  The left breast is status post lumpectomy.  There is no evidence of local recurrence.  Both axillae are benign.  LAB RESULTS:  CMP     Component Value Date/Time   NA 138 08/21/2017 1130   NA 139 07/31/2017 1243   K 4.3 08/21/2017 1130   K 4.2 07/31/2017 1243   CL 106 08/21/2017 1130   CO2 24 08/21/2017 1130   CO2 26 07/31/2017 1243   GLUCOSE 101 08/21/2017 1130   GLUCOSE 90 07/31/2017 1243   GLUCOSE 99 05/18/2006 1500   BUN 15 08/21/2017 1130   BUN 11.1 07/31/2017 1243   CREATININE 0.79 08/21/2017 1130   CREATININE 0.8 07/31/2017 1243   CALCIUM 9.5 08/21/2017 1130   CALCIUM 9.6 07/31/2017 1243   PROT 7.1 08/21/2017 1130   PROT 7.3 07/31/2017 1243   ALBUMIN 3.7 08/21/2017 1130   ALBUMIN 3.9 07/31/2017 1243   AST 21 08/21/2017 1130   AST 23 07/31/2017 1243   ALT 30 08/21/2017 1130   ALT 34 07/31/2017 1243   ALKPHOS 91 08/21/2017 1130    ALKPHOS 93 07/31/2017 1243   BILITOT 0.4 08/21/2017 1130   BILITOT 0.34 07/31/2017 1243   GFRNONAA >  60 08/21/2017 1130   GFRAA >60 08/21/2017 1130    No results found for: Ronnald Ramp, A1GS, A2GS, BETS, BETA2SER, GAMS, MSPIKE, SPEI  No results found for: Nils Pyle, [facility]  Lab Results  Component Value Date   WBC 7.4 08/21/2017   NEUTROABS 4.7 08/21/2017   HGB 13.8 08/21/2017   HCT 42.0 08/21/2017   MCV 97.0 08/21/2017   PLT 299 08/21/2017      Chemistry      Component Value Date/Time   NA 138 08/21/2017 1130   NA 139 07/31/2017 1243   K 4.3 08/21/2017 1130   K 4.2 07/31/2017 1243   CL 106 08/21/2017 1130   CO2 24 08/21/2017 1130   CO2 26 07/31/2017 1243   BUN 15 08/21/2017 1130   BUN 11.1 07/31/2017 1243   CREATININE 0.79 08/21/2017 1130   CREATININE 0.8 07/31/2017 1243      Component Value Date/Time   CALCIUM 9.5 08/21/2017 1130   CALCIUM 9.6 07/31/2017 1243   ALKPHOS 91 08/21/2017 1130   ALKPHOS 93 07/31/2017 1243   AST 21 08/21/2017 1130   AST 23 07/31/2017 1243   ALT 30 08/21/2017 1130   ALT 34 07/31/2017 1243   BILITOT 0.4 08/21/2017 1130   BILITOT 0.34 07/31/2017 1243       No results found for: LABCA2  No components found for: [id number]  No results for input(s): INR in the last 168 hours.  Urinalysis    Component Value Date/Time   COLORURINE YELLOW 10/20/2014 1405   APPEARANCEUR CLEAR 10/20/2014 1405   LABSPEC >=1.030 (A) 10/20/2014 1405   PHURINE 5.5 10/20/2014 1405   GLUCOSEU NEGATIVE 10/20/2014 1405   HGBUR NEGATIVE 10/20/2014 1405   BILIRUBINUR SMALL (A) 10/20/2014 1405   KETONESUR TRACE (A) 10/20/2014 1405   PROTEINUR NEGATIVE 08/24/2009 0900   UROBILINOGEN 0.2 10/20/2014 1405   NITRITE NEGATIVE 10/20/2014 1405   LEUKOCYTESUR NEGATIVE 10/20/2014 1405     STUDIES: Repeat mammography will be due May 2018   ELIGIBLE FOR AVAILABLE RESEARCH PROTOCOL: No  ASSESSMENT: 77 y.o. [place] woman status  post left breast upper outer quadrant biopsy 12/21/2016 for a invasive lobular carcinoma, grade 1, estrogen and progesterone receptor positive, with an MIB-1 of 15%, and HER-2 amplified (triple positive).  (1) left lumpectomy and sentinel lymph node sampling 01/25/2017 showed a pT1b pN0, stage IA invasive lobular breast cancer, grade 2, with negative margins  (2) anti-HER-2 immunotherapy to consist of trastuzumab for 6 months starting 03/01/2017, completed 08/21/2017  (a) echo 02/21/2017 showed an ejection fraction of 60-65%  (b) echo 05/02/2017 showed an ejection fraction of 60-65%  (3) adjuvant radiation omitted as per the multidisciplinary clinic discussion 02/15/2017   (4) anastrozole started 02/15/2017  PLAN: Berenize is completing her Herceptin treatments today.  This is a real achievement for her and it will result I feel sure in long lasting benefit.  She is tolerating the anastrozole moderately well.  She is blaming it for multiple side effects which may or may not be related to that medication.  The plan is to continue anastrozole or another antiestrogen for a total of 5 years.  She wondered if she should go off the bupropion.  Her daughter was quite adamant that she should not.  She feels (the daughter) that [name] has been emotionally more stable since going on occasion.  Accordingly we are continuing on that.  I changed her pharmacy and put the appropriate refills in the appropriate places today.  She is seeing Dr. Alain Marion  every 4 months.  Her next visit with him will be in April.  Accordingly she will return to see me in June.  She knows to call for any other issues that may develop before then.  Magrinat, Virgie Dad, MD  08/21/17 12:41 PM Medical Oncology and Hematology [facility] [address] [place], [place] [address] Tel. [phone]    Fax. [phone]  This document serves as a record of services personally performed by Lurline Del, MD. It was  created on his behalf by Sheron Nightingale, a trained medical scribe. The creation of this record is based on the scribe's personal observations and the provider's statements to them.   I have reviewed the above documentation for accuracy and completeness, and I agree with the above.

## 2017-08-21 ENCOUNTER — Inpatient Hospital Stay: Payer: Medicare Other

## 2017-08-21 ENCOUNTER — Inpatient Hospital Stay (HOSPITAL_BASED_OUTPATIENT_CLINIC_OR_DEPARTMENT_OTHER): Payer: Medicare Other | Admitting: Oncology

## 2017-08-21 ENCOUNTER — Inpatient Hospital Stay: Payer: Medicare Other | Attending: Oncology

## 2017-08-21 ENCOUNTER — Telehealth: Payer: Self-pay | Admitting: Oncology

## 2017-08-21 ENCOUNTER — Encounter: Payer: Self-pay | Admitting: *Deleted

## 2017-08-21 VITALS — BP 166/82 | HR 91 | Temp 98.3°F | Resp 18 | Wt 197.5 lb

## 2017-08-21 DIAGNOSIS — Z79811 Long term (current) use of aromatase inhibitors: Secondary | ICD-10-CM | POA: Insufficient documentation

## 2017-08-21 DIAGNOSIS — C50412 Malignant neoplasm of upper-outer quadrant of left female breast: Secondary | ICD-10-CM

## 2017-08-21 DIAGNOSIS — Z5112 Encounter for antineoplastic immunotherapy: Secondary | ICD-10-CM | POA: Insufficient documentation

## 2017-08-21 DIAGNOSIS — Z170 Estrogen receptor positive status [ER+]: Secondary | ICD-10-CM

## 2017-08-21 LAB — CBC WITH DIFFERENTIAL/PLATELET
Basophils Absolute: 0 10*3/uL (ref 0.0–0.1)
Basophils Relative: 0 %
Eosinophils Absolute: 0.1 10*3/uL (ref 0.0–0.5)
Eosinophils Relative: 2 %
HCT: 42 % (ref 34.8–46.6)
Hemoglobin: 13.8 g/dL (ref 11.6–15.9)
Lymphocytes Relative: 25 %
Lymphs Abs: 1.9 10*3/uL (ref 0.9–3.3)
MCH: 31.9 pg (ref 25.1–34.0)
MCHC: 32.9 g/dL (ref 31.5–36.0)
MCV: 97 fL (ref 79.5–101.0)
Monocytes Absolute: 0.7 10*3/uL (ref 0.1–0.9)
Monocytes Relative: 9 %
Neutro Abs: 4.7 10*3/uL (ref 1.5–6.5)
Neutrophils Relative %: 64 %
Platelets: 299 10*3/uL (ref 145–400)
RBC: 4.33 MIL/uL (ref 3.70–5.45)
RDW: 13.2 % (ref 11.2–16.1)
WBC: 7.4 10*3/uL (ref 3.9–10.3)

## 2017-08-21 LAB — COMPREHENSIVE METABOLIC PANEL
ALT: 30 U/L (ref 0–55)
AST: 21 U/L (ref 5–34)
Albumin: 3.7 g/dL (ref 3.5–5.0)
Alkaline Phosphatase: 91 U/L (ref 40–150)
Anion gap: 8 (ref 3–11)
BUN: 15 mg/dL (ref 7–26)
CO2: 24 mmol/L (ref 22–29)
Calcium: 9.5 mg/dL (ref 8.4–10.4)
Chloride: 106 mmol/L (ref 98–109)
Creatinine, Ser: 0.79 mg/dL (ref 0.60–1.10)
GFR calc Af Amer: >60 mL/min (ref >60)
GFR calc non Af Amer: >60 mL/min (ref >60)
Glucose, Bld: 101 mg/dL (ref 70–140)
Potassium: 4.3 mmol/L (ref 3.3–4.7)
Sodium: 138 mmol/L (ref 136–145)
Total Bilirubin: 0.4 mg/dL (ref 0.2–1.2)
Total Protein: 7.1 g/dL (ref 6.4–8.3)

## 2017-08-21 MED ORDER — DEXAMETHASONE SODIUM PHOSPHATE 10 MG/ML IJ SOLN
INTRAMUSCULAR | Status: AC
  Filled 2017-08-21: qty 1

## 2017-08-21 MED ORDER — ACETAMINOPHEN 325 MG PO TABS
650.0000 mg | ORAL_TABLET | Freq: Once | ORAL | Status: AC
  Administered 2017-08-21: 650 mg via ORAL

## 2017-08-21 MED ORDER — ACETAMINOPHEN 325 MG PO TABS
ORAL_TABLET | ORAL | Status: AC
Start: 2017-08-21 — End: ?
  Filled 2017-08-21: qty 2

## 2017-08-21 MED ORDER — BUPROPION HCL ER (XL) 150 MG PO TB24: 150.0000 mg | ORAL_TABLET | Freq: Every day | ORAL | 6 refills | Status: DC

## 2017-08-21 MED ORDER — FAMOTIDINE IN NACL 20-0.9 MG/50ML-% IV SOLN
20.0000 mg | Freq: Once | INTRAVENOUS | Status: AC
  Administered 2017-08-21: 20 mg via INTRAVENOUS

## 2017-08-21 MED ORDER — DIPHENHYDRAMINE HCL 25 MG PO CAPS
25.0000 mg | ORAL_CAPSULE | Freq: Once | ORAL | Status: AC
  Administered 2017-08-21: 25 mg via ORAL

## 2017-08-21 MED ORDER — ANASTROZOLE 1 MG PO TABS: 1.0000 mg | ORAL_TABLET | Freq: Every day | ORAL | 3 refills | Status: DC

## 2017-08-21 MED ORDER — DEXAMETHASONE SODIUM PHOSPHATE 10 MG/ML IJ SOLN
10.0000 mg | Freq: Once | INTRAMUSCULAR | Status: AC
  Administered 2017-08-21: 10 mg via INTRAVENOUS

## 2017-08-21 MED ORDER — TRASTUZUMAB CHEMO 150 MG IV SOLR
6.0000 mg/kg | Freq: Once | INTRAVENOUS | Status: AC
  Administered 2017-08-21: 525 mg via INTRAVENOUS
  Filled 2017-08-21: qty 25

## 2017-08-21 MED ORDER — DIPHENHYDRAMINE HCL 25 MG PO CAPS
ORAL_CAPSULE | ORAL | Status: AC
  Filled 2017-08-21: qty 1

## 2017-08-21 MED ORDER — FAMOTIDINE IN NACL 20-0.9 MG/50ML-% IV SOLN
INTRAVENOUS | Status: AC
  Filled 2017-08-21: qty 50

## 2017-08-21 MED ORDER — SODIUM CHLORIDE 0.9 % IV SOLN
Freq: Once | INTRAVENOUS | Status: AC
  Administered 2017-08-21: 13:00:00 via INTRAVENOUS

## 2017-08-21 NOTE — Patient Instructions (Signed)
[facility] Discharge Instructions for Patients Receiving Chemotherapy  Today you received the following chemotherapy agents:  Herceptin, (trastuzumab)  To help prevent nausea and vomiting after your treatment, we encourage you to take your nausea medication as prescribed.    If you develop nausea and vomiting that is not controlled by your nausea medication, call the clinic.   BELOW ARE SYMPTOMS THAT SHOULD BE REPORTED IMMEDIATELY:  *FEVER GREATER THAN 100.5 F  *CHILLS WITH OR WITHOUT FEVER  NAUSEA AND VOMITING THAT IS NOT CONTROLLED WITH YOUR NAUSEA MEDICATION  *UNUSUAL SHORTNESS OF BREATH  *UNUSUAL BRUISING OR BLEEDING  TENDERNESS IN MOUTH AND THROAT WITH OR WITHOUT PRESENCE OF ULCERS  *URINARY PROBLEMS  *BOWEL PROBLEMS  UNUSUAL RASH Items with * indicate a potential emergency and should be followed up as soon as possible.  Feel free to call the clinic you have any questions or concerns. The clinic phone number is [phone].  Please show the [facility] at check-in to the Emergency Department and triage nurse.

## 2017-08-21 NOTE — Telephone Encounter (Signed)
Gave patient avs and calendar with appts per 1/21 los.  °

## 2017-08-29 ENCOUNTER — Encounter: Payer: Self-pay | Admitting: Gastroenterology

## 2017-08-30 ENCOUNTER — Other Ambulatory Visit: Payer: Self-pay

## 2017-08-30 ENCOUNTER — Ambulatory Visit (AMBULATORY_SURGERY_CENTER): Payer: Medicare Other | Admitting: Gastroenterology

## 2017-08-30 ENCOUNTER — Encounter: Payer: Self-pay | Admitting: Gastroenterology

## 2017-08-30 VITALS — BP 167/72 | HR 87 | Temp 98.4°F | Resp 19 | Ht 64.0 in | Wt 197.0 lb

## 2017-08-30 DIAGNOSIS — D175 Benign lipomatous neoplasm of intra-abdominal organs: Secondary | ICD-10-CM

## 2017-08-30 DIAGNOSIS — D120 Benign neoplasm of cecum: Secondary | ICD-10-CM | POA: Diagnosis not present

## 2017-08-30 DIAGNOSIS — Z1211 Encounter for screening for malignant neoplasm of colon: Secondary | ICD-10-CM

## 2017-08-30 DIAGNOSIS — Q2733 Arteriovenous malformation of digestive system vessel: Secondary | ICD-10-CM | POA: Diagnosis not present

## 2017-08-30 DIAGNOSIS — Z1212 Encounter for screening for malignant neoplasm of rectum: Secondary | ICD-10-CM | POA: Diagnosis not present

## 2017-08-30 DIAGNOSIS — K5520 Angiodysplasia of colon without hemorrhage: Secondary | ICD-10-CM

## 2017-08-30 MED ORDER — SODIUM CHLORIDE 0.9 % IV SOLN: 500.0000 mL | Freq: Once | INTRAVENOUS | Status: DC

## 2017-08-30 NOTE — Progress Notes (Signed)
Called to room to assist during endoscopic procedure.  Patient ID and intended procedure confirmed with present staff. Received instructions for my participation in the procedure from the performing physician.  

## 2017-08-30 NOTE — Progress Notes (Signed)
A and O x3. Report to RN. Tolerated MAC anesthesia well.

## 2017-08-30 NOTE — Op Note (Signed)
[facility] Patient Name: [name] Procedure Date: 08/30/2017 1:56 PM MRN: [id number] Endoscopist: Remo Lipps P. Armbruster MD, MD Age: 77 Referring MD:  Date of Birth: 30-Jun-1941 Gender: Female Account #: [id number] Procedure:                Colonoscopy Indications:              Screening for colorectal malignant neoplasm Medicines:                Monitored Anesthesia Care Procedure:                Pre-Anesthesia Assessment:                           - Prior to the procedure, a History and Physical                            was performed, and patient medications and                            allergies were reviewed. The patient's tolerance of                            previous anesthesia was also reviewed. The risks                            and benefits of the procedure and the sedation                            options and risks were discussed with the patient.                            All questions were answered, and informed consent                            was obtained. Prior Anticoagulants: The patient has                            taken no previous anticoagulant or antiplatelet                            agents. ASA Grade Assessment: II - A patient with                            mild systemic disease. After reviewing the risks                            and benefits, the patient was deemed in                            satisfactory condition to undergo the procedure.                           After obtaining informed consent, the colonoscope  was passed under direct vision. Throughout the                            procedure, the patient's blood pressure, pulse, and                            oxygen saturations were monitored continuously. The                            Model [id number]-[id number]) scope was introduced                            through the anus and advanced to the the cecum,                             identified by appendiceal orifice and ileocecal                            valve. The colonoscopy was technically difficult                            and complex due to multiple diverticula in the                            colon. The patient tolerated the procedure well.                            The quality of the bowel preparation was good. The                            ileocecal valve, appendiceal orifice, and rectum                            were photographed. Scope In: 2:02:46 PM Scope Out: 2:35:24 PM Scope Withdrawal Time: 0 hours 26 minutes 21 seconds  Total Procedure Duration: 0 hours 32 minutes 38 seconds  Findings:                 The perianal exam findings include non-thrombosed                            external hemorrhoids.                           A 3 mm polyp was found in the cecum. The polyp was                            sessile. The polyp was removed with a cold biopsy                            forceps. Resection and retrieval were complete.                           An area of possible flat polypoid mucosa was found  in the cecum overlying an AVM. It was difficult to                            make out clear borders. A small pinch biopsy was                            taken from the periphery of the lesion that was not                            involved by the AVM with a cold forceps for                            histology to ensure no adenomatous change.                           There was a medium-sized lipoma, in the ascending                            colon.                           Many small and large-mouthed diverticula were found                            in the entire colon. Severe diverticulosis in the                            sigmoid colon with narrowed lumen and sharply                            angulated turns, made for difficult cecal                            intubation and difficult visualization behind some                             folds in this area.                           Internal hemorrhoids were found during retroflexion.                           The exam was otherwise without abnormality. Complications:            No immediate complications. Estimated blood loss:                            Minimal. Estimated Blood Loss:     Estimated blood loss was minimal. Impression:               - Non-thrombosed external hemorrhoids found on                            perianal exam.                           -  One 3 mm polyp in the cecum, removed with a cold                            biopsy forceps. Resected and retrieved.                           - Questionable polypoid tissue versus normal                            variant overlying an AVM as outlined above. Biopsy                            of the lesion obtained.                           - Medium-sized lipoma in the ascending colon.                           - Diverticulosis in the entire examined colon,                            severe in the left colon which prolonged the                            procedure.                           - Internal hemorrhoids.                           - The examination was otherwise normal. Recommendation:           - Patient has a contact number available for                            emergencies. The signs and symptoms of potential                            delayed complications were discussed with the                            patient. Return to normal activities tomorrow.                            Written discharge instructions were provided to the                            patient.                           - Resume previous diet.                           - Continue present medications.                           - Await  pathology results with further                            recommendations. Remo Lipps P. Armbruster MD, MD 08/30/2017 2:45:22 PM This report has been signed electronically.

## 2017-08-30 NOTE — Progress Notes (Signed)
Pt's states no medical or surgical changes since previsit or office visit. 

## 2017-08-30 NOTE — Patient Instructions (Addendum)
INFORMATION ON POLYPS ,DIVERTICULOSIS, HEMORRHOIDS AND HIGH FIBER DIET  GIVEN TO YOU TODAY   AWAIT PATHOLOGY RESULTS ON BIOPSIES AND ON POLYP REMOVED   NO ASPIRIN ,IBUPROFEN,ALEVE ADVIL OR OTHER ANTI INFLAMMATORY PRODUCTS ,TYLENOL OK  YOU HAD AN ENDOSCOPIC PROCEDURE TODAY AT [facility]:   Refer to the procedure report that was given to you for any specific questions about what was found during the examination.  If the procedure report does not answer your questions, please call your gastroenterologist to clarify.  If you requested that your care partner not be given the details of your procedure findings, then the procedure report has been included in a sealed envelope for you to review at your convenience later.  YOU SHOULD EXPECT: Some feelings of bloating in the abdomen. Passage of more gas than usual.  Walking can help get rid of the air that was put into your GI tract during the procedure and reduce the bloating. If you had a lower endoscopy (such as a colonoscopy or flexible sigmoidoscopy) you may notice spotting of blood in your stool or on the toilet paper. If you underwent a bowel prep for your procedure, you may not have a normal bowel movement for a few days.  Please Note:  You might notice some irritation and congestion in your nose or some drainage.  This is from the oxygen used during your procedure.  There is no need for concern and it should clear up in a day or so.  SYMPTOMS TO REPORT IMMEDIATELY:   Following lower endoscopy (colonoscopy or flexible sigmoidoscopy):  Excessive amounts of blood in the stool  Significant tenderness or worsening of abdominal pains  Swelling of the abdomen that is new, acute  Fever of 100F or higher   Following upper endoscopy (EGD)  Vomiting of blood or coffee ground material  New chest pain or pain under the shoulder blades  Painful or persistently difficult swallowing  New shortness of breath  Fever of 100F or  higher  Black, tarry-looking stools  For urgent or emergent issues, a gastroenterologist can be reached at any hour by calling [phone].   DIET:  We do recommend a small meal at first, but then you may proceed to your regular diet.  Drink plenty of fluids but you should avoid alcoholic beverages for 24 hours.  ACTIVITY:  You should plan to take it easy for the rest of today and you should NOT DRIVE or use heavy machinery until tomorrow (because of the sedation medicines used during the test).    FOLLOW UP: Our staff will call the number listed on your records the next business day following your procedure to check on you and address any questions or concerns that you may have regarding the information given to you following your procedure. If we do not reach you, we will leave a message.  However, if you are feeling well and you are not experiencing any problems, there is no need to return our call.  We will assume that you have returned to your regular daily activities without incident.  If any biopsies were taken you will be contacted by phone or by letter within the next 1-3 weeks.  Please call us at [phone] if you have not heard about the biopsies in 3 weeks.    SIGNATURES/CONFIDENTIALITY: You and/or your care partner have signed paperwork which will be entered into your electronic medical record.  These signatures attest to the fact that that the information  above on your After Visit Summary has been reviewed and is understood.  Full responsibility of the confidentiality of this discharge information lies with you and/or your care-partner.

## 2017-08-31 ENCOUNTER — Telehealth: Payer: Self-pay | Admitting: *Deleted

## 2017-08-31 NOTE — Telephone Encounter (Signed)
  Follow up Call-  Call back number 08/30/2017  Post procedure Call Back phone  # [phone]  Permission to leave phone message Yes  Some recent data might be hidden     Patient questions:  Message left to call us if necessary.

## 2017-08-31 NOTE — Telephone Encounter (Signed)
  Follow up Call-  Call back number 08/30/2017  Post procedure Call Back phone  # [phone]  Permission to leave phone message Yes  Some recent data might be hidden     Patient questions:  Do you have a fever, pain , or abdominal swelling? No. Pain Score  0 *  Have you tolerated food without any problems? Yes.    Have you been able to return to your normal activities? Yes.    Do you have any questions about your discharge instructions: Diet   No. Medications  No. Follow up visit  No.  Do you have questions or concerns about your Care? No.  Actions: * If pain score is 4 or above: No action needed, pain <4.

## 2017-09-05 ENCOUNTER — Encounter: Payer: Self-pay | Admitting: Gastroenterology

## 2017-11-02 ENCOUNTER — Ambulatory Visit: Payer: Medicare Other | Admitting: Internal Medicine

## 2017-11-06 ENCOUNTER — Ambulatory Visit (INDEPENDENT_AMBULATORY_CARE_PROVIDER_SITE_OTHER): Payer: Medicare Other | Admitting: Internal Medicine

## 2017-11-06 ENCOUNTER — Encounter: Payer: Self-pay | Admitting: Internal Medicine

## 2017-11-06 DIAGNOSIS — M5440 Lumbago with sciatica, unspecified side: Secondary | ICD-10-CM | POA: Diagnosis not present

## 2017-11-06 DIAGNOSIS — F411 Generalized anxiety disorder: Secondary | ICD-10-CM

## 2017-11-06 DIAGNOSIS — E538 Deficiency of other specified B group vitamins: Secondary | ICD-10-CM

## 2017-11-06 DIAGNOSIS — G4700 Insomnia, unspecified: Secondary | ICD-10-CM

## 2017-11-06 DIAGNOSIS — Z170 Estrogen receptor positive status [ER+]: Secondary | ICD-10-CM | POA: Diagnosis not present

## 2017-11-06 DIAGNOSIS — C50412 Malignant neoplasm of upper-outer quadrant of left female breast: Secondary | ICD-10-CM | POA: Diagnosis not present

## 2017-11-06 NOTE — Assessment & Plan Note (Signed)
Zolpidem prn  Potential benefits of a long term benzodiazepines  use as well as potential risks  and complications were explained to the patient and were aknowledged. 

## 2017-11-06 NOTE — Patient Instructions (Signed)
MC well w/Jill 

## 2017-11-06 NOTE — Assessment & Plan Note (Signed)
On B12 

## 2017-11-06 NOTE — Assessment & Plan Note (Signed)
wellbutrin ° °

## 2017-11-06 NOTE — Progress Notes (Signed)
Subjective:  Patient ID: [name], female    DOB: 1940-08-06  Age: 77 y.o. MRN: [id number]  CC: No chief complaint on file.   HPI [name] presents for insomnia, LBP, OA, breast ca f/u  Outpatient Medications Prior to Visit  Medication Sig Dispense Refill  . Acetaminophen (TYLENOL ARTHRITIS PAIN PO) Take by mouth.    Marland Kitchen amoxicillin (AMOXIL) 500 MG capsule Take 500 mg by mouth 3 (three) times daily.    Marland Kitchen anastrozole (ARIMIDEX) 1 MG tablet Take 1 tablet (1 mg total) by mouth daily. 90 tablet 3  . Ascorbic Acid (VITAMIN C PO) Take by mouth daily.    . Biotin 1000 MCG tablet Take 2,000 mcg by mouth 3 (three) times daily.    Marland Kitchen buPROPion (WELLBUTRIN XL) 150 MG 24 hr tablet Take 1 tablet (150 mg total) by mouth daily. 90 tablet 6  . chlorhexidine (PERIDEX) 0.12 % solution     . Cholecalciferol (VITAMIN D3) 1000 UNITS tablet Take 2,000 Units by mouth daily.      . diazepam (VALIUM) 5 MG tablet TAKE 1 TABLET TWICE A DAY AS NEEDED FOR ANXIETY OR MUSCLE SPASMS 60 tablet 1  . HYDROcodone-acetaminophen (NORCO/VICODIN) 5-325 MG tablet Take 1 tablet by mouth every 6 (six) hours as needed for moderate pain.    . Melatonin 10 MG TABS Take by mouth.    . Omega-3 Fatty Acids (FISH OIL) 1000 MG CAPS Take by mouth.    . vitamin B-12 (CYANOCOBALAMIN) 1000 MCG tablet Take 1,000 mcg by mouth daily.      . vitamin E 1000 UNIT capsule Take 1,000 Units by mouth daily.    Marland Kitchen zolpidem (AMBIEN) 5 MG tablet Take 1 tablet (5 mg total) by mouth at bedtime as needed for sleep. 30 tablet 3  . Trastuzumab (HERCEPTIN IV) Inject into the vein.     Facility-Administered Medications Prior to Visit  Medication Dose Route Frequency Provider Last Rate Last Dose  . 0.9 %  sodium chloride infusion  500 mL Intravenous Once Armbruster, Carlota Raspberry, MD        ROS Review of Systems  Constitutional: Positive for diaphoresis. Negative for activity change, appetite change, chills, fatigue and unexpected weight change.   HENT: Negative for congestion, mouth sores and sinus pressure.   Eyes: Negative for visual disturbance.  Respiratory: Negative for cough and chest tightness.   Gastrointestinal: Negative for abdominal pain and nausea.  Genitourinary: Negative for difficulty urinating, frequency and vaginal pain.  Musculoskeletal: Positive for arthralgias. Negative for back pain and gait problem.  Skin: Negative for pallor and rash.  Neurological: Negative for dizziness, tremors, weakness, numbness and headaches.  Psychiatric/Behavioral: Positive for decreased concentration and dysphoric mood. Negative for confusion, sleep disturbance and suicidal ideas. The patient is nervous/anxious.     Objective:  BP (!) 146/74 (BP Location: Right Arm, Patient Position: Sitting, Cuff Size: Large)   Pulse 84   Temp 97.8 F (36.6 C) (Oral)   Ht 5\' 4"  (1.626 m)   Wt 195 lb (88.5 kg)   SpO2 98%   BMI 33.47 kg/m   BP Readings from Last 3 Encounters:  11/06/17 (!) 146/74  08/30/17 (!) 167/72  08/21/17 (!) 166/82    Wt Readings from Last 3 Encounters:  11/06/17 195 lb (88.5 kg)  08/30/17 197 lb (89.4 kg)  08/21/17 197 lb 8 oz (89.6 kg)    Physical Exam  Constitutional: She appears well-developed. No distress.  HENT:  Head: Normocephalic.  Right  Ear: External ear normal.  Left Ear: External ear normal.  Nose: Nose normal.  Mouth/Throat: Oropharynx is clear and moist.  Eyes: Pupils are equal, round, and reactive to light. Conjunctivae are normal. Right eye exhibits no discharge. Left eye exhibits no discharge.  Neck: Normal range of motion. Neck supple. No JVD present. No tracheal deviation present. No thyromegaly present.  Cardiovascular: Normal rate, regular rhythm and normal heart sounds.  Pulmonary/Chest: No stridor. No respiratory distress. She has no wheezes.  Abdominal: Soft. Bowel sounds are normal. She exhibits no distension and no mass. There is no tenderness. There is no rebound and no guarding.    Musculoskeletal: She exhibits no edema or tenderness.  Lymphadenopathy:    She has no cervical adenopathy.  Neurological: She displays normal reflexes. No cranial nerve deficit. She exhibits normal muscle tone. Coordination normal.  Skin: No rash noted. No erythema.  Psychiatric: She has a normal mood and affect. Her behavior is normal. Judgment and thought content normal.  obese  Lab Results  Component Value Date   WBC 7.4 08/21/2017   HGB 13.8 08/21/2017   HCT 42.0 08/21/2017   PLT 299 08/21/2017   GLUCOSE 101 08/21/2017   CHOL 221 (H) 10/20/2014   TRIG 140.0 10/20/2014   HDL 54.00 10/20/2014   LDLDIRECT 161.3 06/17/2011   LDLCALC 139 (H) 10/20/2014   ALT 30 08/21/2017   AST 21 08/21/2017   NA 138 08/21/2017   K 4.3 08/21/2017   CL 106 08/21/2017   CREATININE 0.79 08/21/2017   BUN 15 08/21/2017   CO2 24 08/21/2017   TSH 2.10 10/20/2014   INR 1.76 (H) 08/29/2009    No results found.  Assessment & Plan:   There are no diagnoses linked to this encounter. I have discontinued [name]. [name] Trastuzumab (HERCEPTIN IV). I am also having her maintain her vitamin B-12, cholecalciferol, Biotin, Ascorbic Acid (VITAMIN C PO), Acetaminophen (TYLENOL ARTHRITIS PAIN PO), vitamin E, Fish Oil, Melatonin, HYDROcodone-acetaminophen, diazepam, zolpidem, anastrozole, buPROPion, amoxicillin, and chlorhexidine. We will continue to administer sodium chloride.  No orders of the defined types were placed in this encounter.    Follow-up: No follow-ups on file.  [name], MD

## 2017-11-06 NOTE — Assessment & Plan Note (Signed)
Norco prn  Potential benefits of a long term opioids use as well as potential risks (i.e. addiction risk, apnea etc) and complications (i.e. Somnolence, constipation and others) were explained to the patient and were aknowledged. 

## 2017-11-14 ENCOUNTER — Other Ambulatory Visit: Payer: Self-pay

## 2017-11-14 DIAGNOSIS — Z170 Estrogen receptor positive status [ER+]: Principal | ICD-10-CM

## 2017-11-14 DIAGNOSIS — C50412 Malignant neoplasm of upper-outer quadrant of left female breast: Secondary | ICD-10-CM

## 2017-11-14 MED ORDER — GABAPENTIN 300 MG PO CAPS: 300.0000 mg | ORAL_CAPSULE | Freq: Every day | ORAL | 0 refills | Status: DC

## 2017-11-14 NOTE — Progress Notes (Addendum)
Pt left VM regarding'hot flashes day and night and has tried the OTC medication for over a month but didn't help, has only got worse'.  Notified Dr Jana Hakim, per Dr Jana Hakim received prescription for Gabapentin 300 mg at bedtime #90.  Called pt and per Dr Jana Hakim, she is to try Gabapentin at bedtime for next 2 weeks and call us to let us know if it is helping.  Pt voiced understanding.

## 2017-11-22 ENCOUNTER — Ambulatory Visit: Payer: Medicare Other

## 2017-11-27 ENCOUNTER — Telehealth: Payer: Self-pay | Admitting: *Deleted

## 2017-11-27 NOTE — Telephone Encounter (Signed)
[name] called to this RN with update on benefits for sleep and hot flashes post starting gabapentin.   [name] states hot flashes have not improved - but she does notice a mild improvement in being able to get 4-5 hours of " straight sleep ".  She also wanted to discuss " my friend says she uses the CBD oil - 1 drop under her tongue and it really helps the hot flashes "  [name] would like to stop the gabapentin and try the CBD oil.  This RN informed pt - she can stop the gabapentin without any tapering.  No further questions at this time.

## 2017-11-27 NOTE — Telephone Encounter (Signed)
Call received from [name] in reference to "speakingor leaving a message for Dr. Virgie Dad nurse".  Call transferred to as requested by patient.

## 2017-12-30 HISTORY — PX: BREAST LUMPECTOMY: SHX2

## 2018-01-03 LAB — HM MAMMOGRAPHY

## 2018-01-05 NOTE — Progress Notes (Signed)
Christina [name]  [phone] [phone]     ID: Christina [name] DOB: 1940-10-20  MR#: [id number]  FXJ#:[id number]  Patient Care Team: Christina Anger, MD as PCP - Christina Roca, MD as Attending Physician (Neurosurgery) Christina Kussmaul, MD as Consulting Physician (General Surgery) Christina Bookbinder, MD as Consulting Physician (Dermatology) [name], Christina Pascal, MD as Consulting Physician (Cardiology) [name], Christina Block, MD as Consulting Physician (Gynecology) OTHER MD:  CHIEF COMPLAINT: Triple positive breast cancer  CURRENT TREATMENT: Anastrozole  BREAST CANCER HISTORY: From the original intake note:  The patient had bilateral screening mammography at [facility] 06/13/2016 showing a 5 mm mass in the left breast upper outer quadrant middle depth. This was felt to be most likely fat necrosis but additional imaging with ultrasonography was obtained the same day and confirmed a 0 point centimeter round oil cyst in the left breast at the 1:00 anterior depth. This was felt to be most likely benign but six--month follow-up diagnostic mammography of the left breast with tomography was recommended and performed 12/20/2016. The breast density was category A.. The oval mass in the left breast upper outer quadrant had increased in size.  Accordingly on 12/21/2016 biopsy of the left breast upper outer quadrant area in question showed ([id number]) and invasive lobular carcinoma, grade 1, estrogen receptor 95% positive, progesterone receptor 80% positive, with [name] MIB-1 of 15%, and HER-2 amplified, the signals ratio being 4.54 and the number per cell 7.23.  The patient has met with my partner Dr. Burr [name] and is here today as a second opinion regarding how to proceed   INTERVAL HISTORY: Christina [name] returns today for follow-up and treatment of her estrogen receptor positive breast cancer. She continues on anastrozole, with some complaints.  She has frequent hot flashes. She  tried taking gabapentin, but she feels that this minimally helped. She initially had 5-6 hot flashes during the day and about 2 at night. After the gabapentin, the hot flashes decreased to about 2-3 in the day and maybe 1 at night.   She has insomnia at night and he doesn't take naps during the day. She takes melatonin, which helps her sleep more.   Her hair has been thinning over the last 2 months. She reports that she compared the side affects for gabapentin, anastrozole, and bupropion, and she found that anastrozole and gabapentin both cause hair thinning and loss.    She did complete the live strong program and is [facility] she is not currently doing that.  Since her last visit, she underwent diagnostic bilateral mammography with CAD and tomography on 01/03/2018 at [facility] showing: breast density category A. There was no evidence of malignancy.   REVIEW OF SYSTEMS: Christina [name] reports that she is having memory issues and difficultly finding words. She tries to write things down to remember. She also feels that she has difficulty understanding or misreading words. This has been frustrating and discouraging for her. She feels angry with herself over little things that bother her. Over the last few months, she had car issues with her son. She notes that her is like new because she barely drove it. She finally got her car fixed and returned to her. She denies unusual headaches, visual changes, nausea, vomiting, or dizziness. There has been no unusual cough, phlegm production, or pleurisy. This been no change in bowel or bladder habits. She denies unexplained fatigue or unexplained weight loss, bleeding, rash, or fever. A detailed  review of systems was otherwise stable.    PAST MEDICAL HISTORY: Past Medical History:  Diagnosis Date  . Anxiety   . Breast cancer ([place])   . Degenerative disk disease   . Diverticulosis   . Hyperlipemia   . LBP (low back  pain)   . Osteoarthritis   . PONV (postoperative nausea and vomiting)   . Vitamin B deficiency   . Vitamin D deficiency     PAST SURGICAL HISTORY: Past Surgical History:  Procedure Laterality Date  . ABDOMINAL HYSTERECTOMY  2005   SUPRACERVICAL HYSTERECTOMY  . APPENDECTOMY    . BREAST BIOPSY Left 06/03/2013   Procedure: BREAST BIOPSY WITH NEEDLE LOCALIZATION;  Surgeon: [name], MD;  Location: [place];  Service: General;  Laterality: Lef also lumpectomy;  . BREAST BIOPSY Left 12/21/2016  . BREAST LUMPECTOMY WITH RADIOACTIVE SEED AND SENTINEL LYMPH NODE BIOPSY Left 01/25/2017   Procedure: LEFT BREAST LUMPECTOMY WITH RADIOACTIVE SEED AND SENTINEL LYMPH NODE BIOPSY;  Surgeon: Christina Kussmaul, MD;  Location: [place];  Service: General;  Laterality: Left;  . BREAST SURGERY  2000   breast reduction... BREAST BIOPSY ON OCTOBER 2014  . cataract surgery Bilateral   . CERVICAL FUSION  1999 & 2009   C3-4 [place]  . EYE SURGERY Left 2012   cataract  . JOINT REPLACEMENT    . SPINE SURGERY  1999&2009  . TONSILLECTOMY    . TOTAL HIP ARTHROPLASTY  (L) 2007 (R) 2011   [place]    FAMILY HISTORY Family History  Problem Relation Age of Onset  . Arthritis Mother 5       polymyositis  . Polymyositis Mother   . Hypertension Father   . Heart disease Father   . Hypertension Brother   . Cancer Brother        Oral  She does not meet criteria for genetics testing   GYNECOLOGIC HISTORY:  No LMP recorded. Patient has had a hysterectomy. Menarche age 47, first live birth age 66, she is [place] P1. She stopped having periods sometime in her early 38s. She did not use hormone replacement but did use some vaginal cream at times after menopause. 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 Her first marriage lasted only 2 years. She had no children from that marriage. Her second husband  died from [name] aneurysm remotely. That second marriage produced her son Christina [name], 90 years old as of June 2018. He works for a [employer]. He is recently separated from his wife in [place] and is living with the patient temporarily. The patient does have 2 grandchildren. She attends [employer]    ADVANCED DIRECTIVES: Not in place. At the 01/11/2017 visit the patient tells me she intends to name her son as her healthcare part of attorney   HEALTH MAINTENANCE: Social History   Tobacco Use  . Smoking status: Former Smoker    Packs/day: 3.00    Years: 20.00    Pack years: 60.00    Last attempt to quit: 04/19/1985    Years since quitting: 32.7  . Smokeless tobacco: Never Used  Substance Use Topics  . Alcohol use: Yes    Comment: occasionally   . Drug use: No     Colonoscopy:November 2008  PAP:  Bone density: 07/16/2015 showed osteopenia   Allergies  Allergen Reactions  . Effexor Xr [  Venlafaxine Hcl Er] Nausea Only    Vision problems, dizziness, light headedness.   . Augmentin [Amoxicillin-Pot Clavulanate] Diarrhea    diarrhea  . Cymbalta [Duloxetine Hcl] Diarrhea    diarrhea  . Ezetimibe-Simvastatin Other (See Comments)  . Phentermine Hcl Other (See Comments)    Current Outpatient Medications  Medication Sig Dispense Refill  . Acetaminophen (TYLENOL ARTHRITIS PAIN PO) Take by mouth.    Marland Kitchen amoxicillin (AMOXIL) 500 MG capsule Take 500 mg by mouth 3 (three) times daily.    Marland Kitchen anastrozole (ARIMIDEX) 1 MG tablet Take 1 tablet (1 mg total) by mouth daily. 90 tablet 3  . Ascorbic Acid (VITAMIN C PO) Take by mouth daily.    . Biotin 1000 MCG tablet Take 2,000 mcg by mouth 3 (three) times daily.    Marland Kitchen buPROPion (WELLBUTRIN XL) 150 MG 24 hr tablet Take 1 tablet (150 mg total) by mouth daily. 90 tablet 6  . chlorhexidine (PERIDEX) 0.12 % solution     . Cholecalciferol (VITAMIN D3) 1000 UNITS tablet Take 2,000 Units by mouth daily.      . diazepam (VALIUM) 5 MG tablet  TAKE 1 TABLET TWICE A DAY AS NEEDED FOR ANXIETY OR MUSCLE SPASMS 60 tablet 1  . gabapentin (NEURONTIN) 300 MG capsule Take 1 capsule (300 mg total) by mouth at bedtime. 90 capsule 0  . HYDROcodone-acetaminophen (NORCO/VICODIN) 5-325 MG tablet Take 1 tablet by mouth every 6 (six) hours as needed for moderate pain.    . Melatonin 10 MG TABS Take by mouth.    . Omega-3 Fatty Acids (FISH OIL) 1000 MG CAPS Take by mouth.    . vitamin B-12 (CYANOCOBALAMIN) 1000 MCG tablet Take 1,000 mcg by mouth daily.      Marland Kitchen zolpidem (AMBIEN) 5 MG tablet Take 1 tablet (5 mg total) by mouth at bedtime as needed for sleep. 30 tablet 3   No current facility-administered medications for this visit.     OBJECTIVE: Older white woman in no acute distress  Vitals:   01/08/18 1121  BP: 137/63  Pulse: 95  Resp: 18  Temp: 98.7 F (37.1 C)  SpO2: 96%     Body mass index is 32.91 kg/m.    ECOG FS:1 - Symptomatic but completely ambulatory  Sclerae unicteric, pupils round and equal Oropharynx clear and moist No cervical or supraclavicular adenopathy Lungs no rales or rhonchi Heart regular rate and rhythm Abd soft, nontender, positive bowel sounds MSK no focal spinal tenderness, no upper extremity lymphedema Neuro: nonfocal, well oriented, appropriate affect Breasts: The right breast is unremarkable.  The left breast has undergone lumpectomy but not radiation.  There is no evidence of local recurrence.  Both axillae are benign.  LAB RESULTS:  CMP     Component Value Date/Time   NA 137 01/08/2018 1108   NA 139 07/31/2017 1243   K 4.0 01/08/2018 1108   K 4.2 07/31/2017 1243   CL 104 01/08/2018 1108   CO2 25 01/08/2018 1108   CO2 26 07/31/2017 1243   GLUCOSE 110 01/08/2018 1108   GLUCOSE 90 07/31/2017 1243   GLUCOSE 99 05/18/2006 1500   BUN 10 01/08/2018 1108   BUN 11.1 07/31/2017 1243   CREATININE 0.82 01/08/2018 1108   CREATININE 0.8 07/31/2017 1243   CALCIUM 10.0 01/08/2018 1108   CALCIUM 9.6  07/31/2017 1243   PROT 7.2 01/08/2018 1108   PROT 7.3 07/31/2017 1243   ALBUMIN 3.9 01/08/2018 1108   ALBUMIN 3.9 07/31/2017 1243   AST 17  01/08/2018 1108   AST 23 07/31/2017 1243   ALT 18 01/08/2018 1108   ALT 34 07/31/2017 1243   ALKPHOS 97 01/08/2018 1108   ALKPHOS 93 07/31/2017 1243   BILITOT 0.4 01/08/2018 1108   BILITOT 0.34 07/31/2017 1243   GFRNONAA >60 01/08/2018 1108   GFRAA >60 01/08/2018 1108    No results found for: TOTALPROTELP, ALBUMINELP, A1GS, A2GS, BETS, BETA2SER, GAMS, MSPIKE, SPEI  No results found for: Nils Pyle, [facility]  Lab Results  Component Value Date   WBC 8.0 01/08/2018   NEUTROABS 5.2 01/08/2018   HGB 13.9 01/08/2018   HCT 40.7 01/08/2018   MCV 94.5 01/08/2018   PLT 276 01/08/2018      Chemistry      Component Value Date/Time   NA 137 01/08/2018 1108   NA 139 07/31/2017 1243   K 4.0 01/08/2018 1108   K 4.2 07/31/2017 1243   CL 104 01/08/2018 1108   CO2 25 01/08/2018 1108   CO2 26 07/31/2017 1243   BUN 10 01/08/2018 1108   BUN 11.1 07/31/2017 1243   CREATININE 0.82 01/08/2018 1108   CREATININE 0.8 07/31/2017 1243      Component Value Date/Time   CALCIUM 10.0 01/08/2018 1108   CALCIUM 9.6 07/31/2017 1243   ALKPHOS 97 01/08/2018 1108   ALKPHOS 93 07/31/2017 1243   AST 17 01/08/2018 1108   AST 23 07/31/2017 1243   ALT 18 01/08/2018 1108   ALT 34 07/31/2017 1243   BILITOT 0.4 01/08/2018 1108   BILITOT 0.34 07/31/2017 1243       No results found for: LABCA2  No components found for: [id number]  No results for input(s): INR in the last 168 hours.  Urinalysis    Component Value Date/Time   COLORURINE YELLOW 10/20/2014 1405   APPEARANCEUR CLEAR 10/20/2014 1405   LABSPEC >=1.030 (A) 10/20/2014 1405   PHURINE 5.5 10/20/2014 1405   GLUCOSEU NEGATIVE 10/20/2014 1405   HGBUR NEGATIVE 10/20/2014 1405   BILIRUBINUR SMALL (A) 10/20/2014 1405   KETONESUR TRACE (A) 10/20/2014 1405   PROTEINUR NEGATIVE 08/24/2009  0900   UROBILINOGEN 0.2 10/20/2014 1405   NITRITE NEGATIVE 10/20/2014 1405   LEUKOCYTESUR NEGATIVE 10/20/2014 1405     STUDIES: Since her last visit, she underwent diagnostic bilateral mammography with CAD and tomography on 01/03/2018 at [facility] showing: breast density category A. There was no evidence of malignancy.  ELIGIBLE FOR AVAILABLE RESEARCH PROTOCOL: No  ASSESSMENT: 77 y.o. [place] woman status post left breast upper outer quadrant biopsy 12/21/2016 for a invasive lobular carcinoma, grade 1, estrogen and progesterone receptor positive, with [name] MIB-1 of 15%, and HER-2 amplified (triple positive).  (1) left lumpectomy and sentinel lymph node sampling 01/25/2017 showed a pT1b pN0, stage IA invasive lobular breast cancer, grade 2, with negative margins  (2) anti-HER-2 immunotherapy to consist of trastuzumab for 6 months starting 03/01/2017, completed 08/21/2017  (a) echo 02/21/2017 showed [name] ejection fraction of 60-65%  (b) echo 05/02/2017 showed [name] ejection fraction of 60-65%  (3) adjuvant radiation omitted as per the multidisciplinary clinic discussion 02/15/2017   (4) anastrozole started 02/15/2017, held 01/08/2018 secondary to possible side effects  (a) bone density in [facility] 07/16/2015 showed osteopenia with T -1.7   PLAN: Christina [name] is now a year out from definitive surgery for her breast cancer with no evidence of disease recurrence.  This is favorable.  She is tolerating tamoxifen moderately well but has many complaints that she attributes to it.  I do think the  hair thinning likely is related.  She has some memory issues which are average for age, as far as I can determine.  Of course she is having hot flashes but these are mild to moderate.  She does have some irritability which I do not think is going to be related to the medication.  At this point I do not think we are going to be able to sort this out unless she goes on a vacation off the  anastrozole.  She will be off it for 3 months.  She will return to see me in September.  I have asked her to keep her list of side effects and if they are all the same we will go back to anastrozole.  Otherwise we will switch to a different antiestrogen  She knows to call for any problems that may develop before the next visit.    Magrinat, Christina Dad, MD  01/08/18 11:48 AM Medical Oncology and Hematology [facility] [address] [place], [place] [address] Tel. [phone]    Fax. [phone]  Alice Rieger, am acting as scribe for Chauncey Cruel MD.  I, Lurline Del MD, have reviewed the above documentation for accuracy and completeness, and I agree with the above.

## 2018-01-08 ENCOUNTER — Inpatient Hospital Stay: Payer: Medicare Other | Attending: Oncology | Admitting: Oncology

## 2018-01-08 ENCOUNTER — Inpatient Hospital Stay: Payer: Medicare Other

## 2018-01-08 ENCOUNTER — Telehealth: Payer: Self-pay | Admitting: Oncology

## 2018-01-08 VITALS — BP 137/63 | HR 95 | Temp 98.7°F | Resp 18 | Ht 64.0 in | Wt 191.7 lb

## 2018-01-08 DIAGNOSIS — C50412 Malignant neoplasm of upper-outer quadrant of left female breast: Secondary | ICD-10-CM

## 2018-01-08 DIAGNOSIS — G4700 Insomnia, unspecified: Secondary | ICD-10-CM | POA: Diagnosis not present

## 2018-01-08 DIAGNOSIS — R232 Flushing: Secondary | ICD-10-CM | POA: Diagnosis not present

## 2018-01-08 DIAGNOSIS — Z170 Estrogen receptor positive status [ER+]: Principal | ICD-10-CM

## 2018-01-08 DIAGNOSIS — Z90710 Acquired absence of both cervix and uterus: Secondary | ICD-10-CM | POA: Diagnosis not present

## 2018-01-08 DIAGNOSIS — M1990 Unspecified osteoarthritis, unspecified site: Secondary | ICD-10-CM | POA: Insufficient documentation

## 2018-01-08 DIAGNOSIS — Z79811 Long term (current) use of aromatase inhibitors: Secondary | ICD-10-CM | POA: Insufficient documentation

## 2018-01-08 DIAGNOSIS — Z79899 Other long term (current) drug therapy: Secondary | ICD-10-CM | POA: Diagnosis not present

## 2018-01-08 DIAGNOSIS — R454 Irritability and anger: Secondary | ICD-10-CM | POA: Diagnosis not present

## 2018-01-08 DIAGNOSIS — E785 Hyperlipidemia, unspecified: Secondary | ICD-10-CM | POA: Insufficient documentation

## 2018-01-08 DIAGNOSIS — Z87891 Personal history of nicotine dependence: Secondary | ICD-10-CM | POA: Insufficient documentation

## 2018-01-08 DIAGNOSIS — M8588 Other specified disorders of bone density and structure, other site: Secondary | ICD-10-CM

## 2018-01-08 LAB — COMPREHENSIVE METABOLIC PANEL
ALT: 18 U/L (ref 0–55)
AST: 17 U/L (ref 5–34)
Albumin: 3.9 g/dL (ref 3.5–5.0)
Alkaline Phosphatase: 97 U/L (ref 40–150)
Anion gap: 8 (ref 3–11)
BUN: 10 mg/dL (ref 7–26)
CO2: 25 mmol/L (ref 22–29)
Calcium: 10 mg/dL (ref 8.4–10.4)
Chloride: 104 mmol/L (ref 98–109)
Creatinine, Ser: 0.82 mg/dL (ref 0.60–1.10)
GFR calc Af Amer: >60 mL/min (ref >60)
GFR calc non Af Amer: >60 mL/min (ref >60)
Glucose, Bld: 110 mg/dL (ref 70–140)
Potassium: 4 mmol/L (ref 3.5–5.1)
Sodium: 137 mmol/L (ref 136–145)
Total Bilirubin: 0.4 mg/dL (ref 0.2–1.2)
Total Protein: 7.2 g/dL (ref 6.4–8.3)

## 2018-01-08 LAB — CBC WITH DIFFERENTIAL/PLATELET
Basophils Absolute: 0.1 10*3/uL (ref 0.0–0.1)
Basophils Relative: 1 %
Eosinophils Absolute: 0.3 10*3/uL (ref 0.0–0.5)
Eosinophils Relative: 3 %
HCT: 40.7 % (ref 34.8–46.6)
Hemoglobin: 13.9 g/dL (ref 11.6–15.9)
Lymphocytes Relative: 22 %
Lymphs Abs: 1.7 10*3/uL (ref 0.9–3.3)
MCH: 32.3 pg (ref 25.1–34.0)
MCHC: 34.2 g/dL (ref 31.5–36.0)
MCV: 94.5 fL (ref 79.5–101.0)
Monocytes Absolute: 0.8 10*3/uL (ref 0.1–0.9)
Monocytes Relative: 10 %
Neutro Abs: 5.2 10*3/uL (ref 1.5–6.5)
Neutrophils Relative %: 64 %
Platelets: 276 10*3/uL (ref 145–400)
RBC: 4.31 MIL/uL (ref 3.70–5.45)
RDW: 13.2 % (ref 11.2–14.5)
WBC: 8 10*3/uL (ref 3.9–10.3)

## 2018-01-08 NOTE — Telephone Encounter (Signed)
Gave avs and calendar ° °

## 2018-01-25 ENCOUNTER — Encounter: Payer: Self-pay | Admitting: Internal Medicine

## 2018-02-11 ENCOUNTER — Other Ambulatory Visit: Payer: Self-pay | Admitting: Oncology

## 2018-02-11 DIAGNOSIS — Z170 Estrogen receptor positive status [ER+]: Principal | ICD-10-CM

## 2018-02-11 DIAGNOSIS — C50412 Malignant neoplasm of upper-outer quadrant of left female breast: Secondary | ICD-10-CM

## 2018-02-15 ENCOUNTER — Other Ambulatory Visit: Payer: Self-pay | Admitting: *Deleted

## 2018-02-15 DIAGNOSIS — C50412 Malignant neoplasm of upper-outer quadrant of left female breast: Secondary | ICD-10-CM

## 2018-02-15 DIAGNOSIS — Z170 Estrogen receptor positive status [ER+]: Principal | ICD-10-CM

## 2018-02-15 MED ORDER — GABAPENTIN 300 MG PO CAPS: 300.0000 mg | ORAL_CAPSULE | Freq: Every day | ORAL | 1 refills | Status: DC

## 2018-04-08 NOTE — Progress Notes (Signed)
[facility]  Telephone:(336) [phone] Fax:(336) [phone]     ID: [name] DOB: Jun 13, 1941  MR#: [id number]  YBO#:[id number]  Patient Care Team: Cassandria Anger, MD as PCP - Lisa Roca, MD as Attending Physician (Neurosurgery) Jovita Kussmaul, MD as Consulting Physician (General Surgery) Rolm Bookbinder, MD as Consulting Physician (Dermatology) Bensimhon, Shaune Pascal, MD as Consulting Physician (Cardiology) Fontaine, Belinda Block, MD as Consulting Physician (Gynecology) OTHER MD:  CHIEF COMPLAINT: Triple positive breast cancer  CURRENT TREATMENT: Anastrozole  BREAST CANCER HISTORY: From the original intake note:  The patient had bilateral screening mammography at [facility] 06/13/2016 showing a 5 mm mass in the left breast upper outer quadrant middle depth. This was felt to be most likely fat necrosis but additional imaging with ultrasonography was obtained the same day and confirmed a 0 point centimeter round oil cyst in the left breast at the 1:00 anterior depth. This was felt to be most likely benign but six--month follow-up diagnostic mammography of the left breast with tomography was recommended and performed 12/20/2016. The breast density was category A.. The oval mass in the left breast upper outer quadrant had increased in size.  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  The patient has met with my partner Dr. Burr Medico and is here today as a second opinion regarding how to proceed   INTERVAL HISTORY: Christina returns today for follow-up and treatment of her estrogen receptor positive breast cancer. At her last visit on 01/08/2018, the anastrozole was held due to possible side affects.  Since being off the anastrozole, she has not noticed much of a difference in her symptoms. She still has hot flashes, but she started taking OTC Estroven menopause symptom relief, which has decreased her hot flashes to about once per day. She denies issues with vaginal dryness. She is still having hair thinning, but it has slightly decreased. She also stopped taking gabapentin about 2 weeks ago.  Since her last visit, she underwent diagnostic bilateral mammography with CAD and tomography on 01/03/2018 at [facility] showing: breast density category A. There was no evidence of malignancy. She has had no soreness or shooting pains in her breasts.     REVIEW OF SYSTEMS: [name] reports that because her hair is thinner, her hair is wavy on it's own. She tries to straighten it with her brush, but she is thinking of using a flat iron. She is still having some depression. She started going to the gym twice per week with a trainer. She plans on exercises on her own. She also has some blurry and monovision when she watches tv, and she has trouble reading fine print. She has insomnia at night which she tried aiding with melatonin with little success. She denies unusual headaches, nausea, vomiting, or dizziness. There has been no unusual cough, phlegm production, or pleurisy. There has been no change in bowel or bladder habits. She denies unexplained fatigue or unexplained weight loss, bleeding, rash, or fever. A detailed review of systems was otherwise stable.    PAST MEDICAL HISTORY: Past Medical History:  Diagnosis Date  . Anxiety   . Breast cancer ([place])   . Degenerative disk disease   . Diverticulosis   . Hyperlipemia   . LBP (low  [facility]  Telephone:(336) [phone] Fax:(336) [phone]     ID: [name] DOB: Jun 13, 1941  MR#: [id number]  YBO#:[id number]  Patient Care Team: Cassandria Anger, MD as PCP - Lisa Roca, MD as Attending Physician (Neurosurgery) Jovita Kussmaul, MD as Consulting Physician (General Surgery) Rolm Bookbinder, MD as Consulting Physician (Dermatology) Bensimhon, Shaune Pascal, MD as Consulting Physician (Cardiology) Fontaine, Belinda Block, MD as Consulting Physician (Gynecology) OTHER MD:  CHIEF COMPLAINT: Triple positive breast cancer  CURRENT TREATMENT: Anastrozole  BREAST CANCER HISTORY: From the original intake note:  The patient had bilateral screening mammography at [facility] 06/13/2016 showing a 5 mm mass in the left breast upper outer quadrant middle depth. This was felt to be most likely fat necrosis but additional imaging with ultrasonography was obtained the same day and confirmed a 0 point centimeter round oil cyst in the left breast at the 1:00 anterior depth. This was felt to be most likely benign but six--month follow-up diagnostic mammography of the left breast with tomography was recommended and performed 12/20/2016. The breast density was category A.. The oval mass in the left breast upper outer quadrant had increased in size.  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  The patient has met with my partner Dr. Burr Medico and is here today as a second opinion regarding how to proceed   INTERVAL HISTORY: Christina returns today for follow-up and treatment of her estrogen receptor positive breast cancer. At her last visit on 01/08/2018, the anastrozole was held due to possible side affects.  Since being off the anastrozole, she has not noticed much of a difference in her symptoms. She still has hot flashes, but she started taking OTC Estroven menopause symptom relief, which has decreased her hot flashes to about once per day. She denies issues with vaginal dryness. She is still having hair thinning, but it has slightly decreased. She also stopped taking gabapentin about 2 weeks ago.  Since her last visit, she underwent diagnostic bilateral mammography with CAD and tomography on 01/03/2018 at [facility] showing: breast density category A. There was no evidence of malignancy. She has had no soreness or shooting pains in her breasts.     REVIEW OF SYSTEMS: [name] reports that because her hair is thinner, her hair is wavy on it's own. She tries to straighten it with her brush, but she is thinking of using a flat iron. She is still having some depression. She started going to the gym twice per week with a trainer. She plans on exercises on her own. She also has some blurry and monovision when she watches tv, and she has trouble reading fine print. She has insomnia at night which she tried aiding with melatonin with little success. She denies unusual headaches, nausea, vomiting, or dizziness. There has been no unusual cough, phlegm production, or pleurisy. There has been no change in bowel or bladder habits. She denies unexplained fatigue or unexplained weight loss, bleeding, rash, or fever. A detailed review of systems was otherwise stable.    PAST MEDICAL HISTORY: Past Medical History:  Diagnosis Date  . Anxiety   . Breast cancer ([place])   . Degenerative disk disease   . Diverticulosis   . Hyperlipemia   . LBP (low  [facility]  Telephone:(336) [phone] Fax:(336) [phone]     ID: [name] DOB: Jun 13, 1941  MR#: [id number]  YBO#:[id number]  Patient Care Team: Cassandria Anger, MD as PCP - Lisa Roca, MD as Attending Physician (Neurosurgery) Jovita Kussmaul, MD as Consulting Physician (General Surgery) Rolm Bookbinder, MD as Consulting Physician (Dermatology) Bensimhon, Shaune Pascal, MD as Consulting Physician (Cardiology) Fontaine, Belinda Block, MD as Consulting Physician (Gynecology) OTHER MD:  CHIEF COMPLAINT: Triple positive breast cancer  CURRENT TREATMENT: Anastrozole  BREAST CANCER HISTORY: From the original intake note:  The patient had bilateral screening mammography at [facility] 06/13/2016 showing a 5 mm mass in the left breast upper outer quadrant middle depth. This was felt to be most likely fat necrosis but additional imaging with ultrasonography was obtained the same day and confirmed a 0 point centimeter round oil cyst in the left breast at the 1:00 anterior depth. This was felt to be most likely benign but six--month follow-up diagnostic mammography of the left breast with tomography was recommended and performed 12/20/2016. The breast density was category A.. The oval mass in the left breast upper outer quadrant had increased in size.  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  The patient has met with my partner Dr. Burr Medico and is here today as a second opinion regarding how to proceed   INTERVAL HISTORY: Christina returns today for follow-up and treatment of her estrogen receptor positive breast cancer. At her last visit on 01/08/2018, the anastrozole was held due to possible side affects.  Since being off the anastrozole, she has not noticed much of a difference in her symptoms. She still has hot flashes, but she started taking OTC Estroven menopause symptom relief, which has decreased her hot flashes to about once per day. She denies issues with vaginal dryness. She is still having hair thinning, but it has slightly decreased. She also stopped taking gabapentin about 2 weeks ago.  Since her last visit, she underwent diagnostic bilateral mammography with CAD and tomography on 01/03/2018 at [facility] showing: breast density category A. There was no evidence of malignancy. She has had no soreness or shooting pains in her breasts.     REVIEW OF SYSTEMS: [name] reports that because her hair is thinner, her hair is wavy on it's own. She tries to straighten it with her brush, but she is thinking of using a flat iron. She is still having some depression. She started going to the gym twice per week with a trainer. She plans on exercises on her own. She also has some blurry and monovision when she watches tv, and she has trouble reading fine print. She has insomnia at night which she tried aiding with melatonin with little success. She denies unusual headaches, nausea, vomiting, or dizziness. There has been no unusual cough, phlegm production, or pleurisy. There has been no change in bowel or bladder habits. She denies unexplained fatigue or unexplained weight loss, bleeding, rash, or fever. A detailed review of systems was otherwise stable.    PAST MEDICAL HISTORY: Past Medical History:  Diagnosis Date  . Anxiety   . Breast cancer ([place])   . Degenerative disk disease   . Diverticulosis   . Hyperlipemia   . LBP (low  [facility]  Telephone:(336) [phone] Fax:(336) [phone]     ID: [name] DOB: Jun 13, 1941  MR#: [id number]  YBO#:[id number]  Patient Care Team: Cassandria Anger, MD as PCP - Lisa Roca, MD as Attending Physician (Neurosurgery) Jovita Kussmaul, MD as Consulting Physician (General Surgery) Rolm Bookbinder, MD as Consulting Physician (Dermatology) Bensimhon, Shaune Pascal, MD as Consulting Physician (Cardiology) Fontaine, Belinda Block, MD as Consulting Physician (Gynecology) OTHER MD:  CHIEF COMPLAINT: Triple positive breast cancer  CURRENT TREATMENT: Anastrozole  BREAST CANCER HISTORY: From the original intake note:  The patient had bilateral screening mammography at [facility] 06/13/2016 showing a 5 mm mass in the left breast upper outer quadrant middle depth. This was felt to be most likely fat necrosis but additional imaging with ultrasonography was obtained the same day and confirmed a 0 point centimeter round oil cyst in the left breast at the 1:00 anterior depth. This was felt to be most likely benign but six--month follow-up diagnostic mammography of the left breast with tomography was recommended and performed 12/20/2016. The breast density was category A.. The oval mass in the left breast upper outer quadrant had increased in size.  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  The patient has met with my partner Dr. Burr Medico and is here today as a second opinion regarding how to proceed   INTERVAL HISTORY: Christina returns today for follow-up and treatment of her estrogen receptor positive breast cancer. At her last visit on 01/08/2018, the anastrozole was held due to possible side affects.  Since being off the anastrozole, she has not noticed much of a difference in her symptoms. She still has hot flashes, but she started taking OTC Estroven menopause symptom relief, which has decreased her hot flashes to about once per day. She denies issues with vaginal dryness. She is still having hair thinning, but it has slightly decreased. She also stopped taking gabapentin about 2 weeks ago.  Since her last visit, she underwent diagnostic bilateral mammography with CAD and tomography on 01/03/2018 at [facility] showing: breast density category A. There was no evidence of malignancy. She has had no soreness or shooting pains in her breasts.     REVIEW OF SYSTEMS: [name] reports that because her hair is thinner, her hair is wavy on it's own. She tries to straighten it with her brush, but she is thinking of using a flat iron. She is still having some depression. She started going to the gym twice per week with a trainer. She plans on exercises on her own. She also has some blurry and monovision when she watches tv, and she has trouble reading fine print. She has insomnia at night which she tried aiding with melatonin with little success. She denies unusual headaches, nausea, vomiting, or dizziness. There has been no unusual cough, phlegm production, or pleurisy. There has been no change in bowel or bladder habits. She denies unexplained fatigue or unexplained weight loss, bleeding, rash, or fever. A detailed review of systems was otherwise stable.    PAST MEDICAL HISTORY: Past Medical History:  Diagnosis Date  . Anxiety   . Breast cancer ([place])   . Degenerative disk disease   . Diverticulosis   . Hyperlipemia   . LBP (low  [facility]  Telephone:(336) [phone] Fax:(336) [phone]     ID: [name] DOB: Jun 13, 1941  MR#: [id number]  YBO#:[id number]  Patient Care Team: Cassandria Anger, MD as PCP - Lisa Roca, MD as Attending Physician (Neurosurgery) Jovita Kussmaul, MD as Consulting Physician (General Surgery) Rolm Bookbinder, MD as Consulting Physician (Dermatology) Bensimhon, Shaune Pascal, MD as Consulting Physician (Cardiology) Fontaine, Belinda Block, MD as Consulting Physician (Gynecology) OTHER MD:  CHIEF COMPLAINT: Triple positive breast cancer  CURRENT TREATMENT: Anastrozole  BREAST CANCER HISTORY: From the original intake note:  The patient had bilateral screening mammography at [facility] 06/13/2016 showing a 5 mm mass in the left breast upper outer quadrant middle depth. This was felt to be most likely fat necrosis but additional imaging with ultrasonography was obtained the same day and confirmed a 0 point centimeter round oil cyst in the left breast at the 1:00 anterior depth. This was felt to be most likely benign but six--month follow-up diagnostic mammography of the left breast with tomography was recommended and performed 12/20/2016. The breast density was category A.. The oval mass in the left breast upper outer quadrant had increased in size.  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  The patient has met with my partner Dr. Burr Medico and is here today as a second opinion regarding how to proceed   INTERVAL HISTORY: Christina returns today for follow-up and treatment of her estrogen receptor positive breast cancer. At her last visit on 01/08/2018, the anastrozole was held due to possible side affects.  Since being off the anastrozole, she has not noticed much of a difference in her symptoms. She still has hot flashes, but she started taking OTC Estroven menopause symptom relief, which has decreased her hot flashes to about once per day. She denies issues with vaginal dryness. She is still having hair thinning, but it has slightly decreased. She also stopped taking gabapentin about 2 weeks ago.  Since her last visit, she underwent diagnostic bilateral mammography with CAD and tomography on 01/03/2018 at [facility] showing: breast density category A. There was no evidence of malignancy. She has had no soreness or shooting pains in her breasts.     REVIEW OF SYSTEMS: [name] reports that because her hair is thinner, her hair is wavy on it's own. She tries to straighten it with her brush, but she is thinking of using a flat iron. She is still having some depression. She started going to the gym twice per week with a trainer. She plans on exercises on her own. She also has some blurry and monovision when she watches tv, and she has trouble reading fine print. She has insomnia at night which she tried aiding with melatonin with little success. She denies unusual headaches, nausea, vomiting, or dizziness. There has been no unusual cough, phlegm production, or pleurisy. There has been no change in bowel or bladder habits. She denies unexplained fatigue or unexplained weight loss, bleeding, rash, or fever. A detailed review of systems was otherwise stable.    PAST MEDICAL HISTORY: Past Medical History:  Diagnosis Date  . Anxiety   . Breast cancer ([place])   . Degenerative disk disease   . Diverticulosis   . Hyperlipemia   . LBP (low

## 2018-04-09 ENCOUNTER — Inpatient Hospital Stay: Payer: Medicare Other | Attending: Oncology | Admitting: Oncology

## 2018-04-09 ENCOUNTER — Inpatient Hospital Stay: Payer: Medicare Other

## 2018-04-09 VITALS — BP 160/80 | HR 87 | Temp 98.3°F | Resp 18 | Ht 64.0 in | Wt 185.0 lb

## 2018-04-09 DIAGNOSIS — C50412 Malignant neoplasm of upper-outer quadrant of left female breast: Secondary | ICD-10-CM | POA: Insufficient documentation

## 2018-04-09 DIAGNOSIS — M8580 Other specified disorders of bone density and structure, unspecified site: Secondary | ICD-10-CM | POA: Diagnosis not present

## 2018-04-09 DIAGNOSIS — Z170 Estrogen receptor positive status [ER+]: Secondary | ICD-10-CM | POA: Diagnosis not present

## 2018-04-09 DIAGNOSIS — Z923 Personal history of irradiation: Secondary | ICD-10-CM | POA: Diagnosis not present

## 2018-04-09 DIAGNOSIS — Z79810 Long term (current) use of selective estrogen receptor modulators (SERMs): Secondary | ICD-10-CM | POA: Diagnosis not present

## 2018-04-09 MED ORDER — ANASTROZOLE 1 MG PO TABS: 1.0000 mg | ORAL_TABLET | Freq: Every day | ORAL | 4 refills | Status: DC

## 2018-05-09 ENCOUNTER — Ambulatory Visit (INDEPENDENT_AMBULATORY_CARE_PROVIDER_SITE_OTHER): Payer: Medicare Other | Admitting: Internal Medicine

## 2018-05-09 ENCOUNTER — Encounter: Payer: Self-pay | Admitting: Internal Medicine

## 2018-05-09 VITALS — BP 132/78 | HR 102 | Temp 97.9°F | Ht 64.0 in | Wt 184.0 lb

## 2018-05-09 DIAGNOSIS — G8929 Other chronic pain: Secondary | ICD-10-CM

## 2018-05-09 DIAGNOSIS — E538 Deficiency of other specified B group vitamins: Secondary | ICD-10-CM | POA: Diagnosis not present

## 2018-05-09 DIAGNOSIS — R197 Diarrhea, unspecified: Secondary | ICD-10-CM

## 2018-05-09 DIAGNOSIS — G4700 Insomnia, unspecified: Secondary | ICD-10-CM

## 2018-05-09 DIAGNOSIS — M5440 Lumbago with sciatica, unspecified side: Secondary | ICD-10-CM | POA: Diagnosis not present

## 2018-05-09 DIAGNOSIS — Z23 Encounter for immunization: Secondary | ICD-10-CM

## 2018-05-09 MED ORDER — ZOLPIDEM TARTRATE 5 MG PO TABS: 5.0000 mg | ORAL_TABLET | Freq: Every evening | ORAL | 3 refills | Status: DC | PRN

## 2018-05-09 MED ORDER — HYDROCODONE-ACETAMINOPHEN 5-325 MG PO TABS: 1.0000 | ORAL_TABLET | Freq: Two times a day (BID) | ORAL | 0 refills | Status: DC | PRN

## 2018-05-09 NOTE — Patient Instructions (Addendum)
Zenppep 1-2 with food when you eat out   Gluten free trial for 4-6 weeks. OK to use gluten-free bread and gluten-free pasta.    Gluten-Free Diet for Celiac Disease, Adult The gluten-free diet includes all foods that do not contain gluten. Gluten is a protein that is found in wheat, rye, barley, and some other grains. Following the gluten-free diet is the only treatment for people with celiac disease. It helps to prevent damage to the intestines and improves or eliminates the symptoms of celiac disease. Following the gluten-free diet requires some planning. It can be challenging at first, but it gets easier with time and practice. There are more gluten-free options available today than ever before. If you need help finding gluten-free foods or if you have questions, talk with your diet and nutrition specialist (registered dietitian) or your health care provider. What do I need to know about a gluten-free diet?  All fruits, vegetables, and meats are safe to eat and do not contain gluten.  When grocery shopping, start by shopping in the produce, meat, and dairy sections. These sections are more likely to contain gluten-free foods. Then move to the aisles that contain packaged foods if you need to.  Read all food labels. Gluten is often added to foods. Always check the ingredient list and look for warnings, such as "may contain gluten."  Talk with your dietitian or health care provider before taking a gluten-free multivitamin or mineral supplement.  Be aware of gluten-free foods having contact with foods that contain gluten (cross-contamination). This can happen at home and with any processed foods. ? Talk with your health care provider or dietitian about how to reduce the risk of cross-contamination in your home. ? If you have questions about how a food is processed, ask the manufacturer. What key words help to identify gluten? Foods that list any of these key words on the label usually contain  gluten:  Wheat, flour, enriched flour, bromated flour, white flour, durum flour, graham flour, phosphated flour, self-rising flour, semolina, farina, barley (malt), rye, and oats.  Starch, dextrin, modified food starch, or cereal.  Thickening, fillers, or emulsifiers.  Malt flavoring, malt extract, or malt syrup.  Hydrolyzed vegetable protein.  In the [place] packaged foods that are gluten-free are required to be labeled "GF." These foods should be easy to identify and are safe to eat. In the [place] food companies are also required to list common food allergens, including wheat, on their labels. Recommended foods Grains  Amaranth, bean flours, 100% buckwheat flour, corn, millet, nut flours or nut meals, GF oats, quinoa, rice, sorghum, teff, rice wafers, pure cornmeal tortillas, popcorn, and hot cereals made from cornmeal. Hominy, rice, wild rice. Some Asian rice noodles or bean noodles. Arrowroot starch, corn bran, corn flour, corn germ, cornmeal, corn starch, potato flour, potato starch flour, and rice bran. Plain, brown, and sweet rice flours. Rice polish, soy flour, and tapioca starch. Vegetables  All plain fresh, frozen, and canned vegetables. Fruits  All plain fresh, frozen, canned, and dried fruits, and 100% fruit juices. Meats and other protein foods  All fresh beef, pork, poultry, fish, seafood, and eggs. Fish canned in water, oil, brine, or vegetable broth. Plain nuts and seeds, peanut butter. Some lunch meat and some frankfurters. Dried beans, dried peas, and lentils. Dairy  Fresh plain, dry, evaporated, or condensed milk. Cream, butter, sour cream, whipping cream, and most yogurts. Unprocessed cheese, most processed cheeses, some cottage cheese, some cream cheeses. Beverages  Coffee, tea, most  herbal teas. Carbonated beverages and some root beers. Wine, sake, and distilled spirits, such as gin, vodka, and whiskey. Most hard ciders. Fats and oils  Butter, margarine, vegetable  oil, hydrogenated butter, olive oil, shortening, lard, cream, and some mayonnaise. Some commercial salad dressings. Olives. Sweets and desserts  Sugar, honey, some syrups, molasses, jelly, and jam. Plain hard candy, marshmallows, and gumdrops. Pure cocoa powder. Plain chocolate. Custard and some pudding mixes. Gelatin desserts, sorbets, frozen ice pops, and sherbet. Cake, cookies, and other desserts prepared with allowed flours. Some commercial ice creams. Cornstarch, tapioca, and rice puddings. Seasoning and other foods  Some canned or frozen soups. Monosodium glutamate (MSG). Cider, rice, and wine vinegar. Baking soda and baking powder. Cream of tartar. Baking and nutritional yeast. Certain soy sauces made without wheat (ask your dietitian about specific brands that are allowed). Nuts, coconut, and chocolate. Salt, pepper, herbs, spices, flavoring extracts, imitation or artificial flavorings, natural flavorings, and food colorings. Some medicines and supplements. Some lip glosses and other cosmetics. Rice syrups. The items listed may not be a complete list. Talk with your dietitian about what dietary choices are best for you. Foods to avoid Grains  Barley, bran, bulgur, couscous, cracked wheat, [place], farro, graham, malt, matzo, semolina, wheat germ, and all wheat and rye cereals including spelt and kamut. Cereals containing malt as a flavoring, such as rice cereal. Noodles, spaghetti, macaroni, most packaged rice mixes, and all mixes containing wheat, rye, barley, or triticale. Vegetables  Most creamed vegetables and most vegetables canned in sauces. Some commercially prepared vegetables and salads. Fruits  Thickened or prepared fruits and some pie fillings. Some fruit snacks and fruit roll-ups. Meats and other protein foods  Any meat or meat alternative containing wheat, rye, barley, or gluten stabilizers. These are often marinated or packaged meats and lunch meats. Bread-containing  products, such as Swiss steak, croquettes, meatballs, and meatloaf. Most tuna canned in vegetable broth and [place] with hydrolyzed vegetable protein (HVP) injected as part of the basting. [place]. Imitation fish. Eggs in sauces made from ingredients to avoid. Dairy  Commercial chocolate milk drinks and malted milk. Some non-dairy creamers. Any cheese product containing ingredients to avoid. Beverages  Certain cereal beverages. Beer, ale, malted milk, and some root beers. Some hard ciders. Some instant flavored coffees. Some herbal teas made with barley or with barley malt added. Fats and oils  Some commercial salad dressings. Sour cream containing modified food starch. Sweets and desserts  Some toffees. Chocolate-coated nuts (may be rolled in wheat flour) and some commercial candies and candy bars. Most cakes, cookies, donuts, pastries, and other baked goods. Some commercial ice cream. Ice cream cones. Commercially prepared mixes for cakes, cookies, and other desserts. Bread pudding and other puddings thickened with flour. Products containing brown rice syrup made with barley malt enzyme. Desserts and sweets made with malt flavoring. Seasoning and other foods  Some curry powders, some dry seasoning mixes, some gravy extracts, some meat sauces, some ketchups, some prepared mustards, and horseradish. Certain soy sauces. Malt vinegar. Bouillon and bouillon cubes that contain HVP. Some chip dips, and some chewing gum. Yeast extract. Brewer's yeast. Caramel color. Some medicines and supplements. Some lip glosses and other cosmetics. The items listed may not be a complete list. Talk with your dietitian about what dietary choices are best for you. Summary  Gluten is a protein that is found in wheat, rye, barley, and some other grains. The gluten-free diet includes all foods that do not contain gluten.  If  you need help finding gluten-free foods or if you have questions, talk with your diet and nutrition  specialist (registered dietitian) or your health care provider.  Read all food labels. Gluten is often added to foods. Always check the ingredient list and look for warnings, such as "may contain gluten." This information is not intended to replace advice given to you by your health care provider. Make sure you discuss any questions you have with your health care provider. Document Released: 07/18/2005 Document Revised: 05/02/2016 Document Reviewed: 05/02/2016 Elsevier Interactive Patient Education  2018 [employer].

## 2018-05-09 NOTE — Progress Notes (Signed)
Subjective:  Patient ID: [name], female    DOB: Mar 02, 1941  Age: 77 y.o. MRN: [id number]  CC: No chief complaint on file.   HPI [name] presents for OA, insomnia, LBP f/u C/o constipation and occ diarrhea w/incontinence x 2 mo Lost wt  Outpatient Medications Prior to Visit  Medication Sig Dispense Refill  . Acetaminophen (TYLENOL ARTHRITIS PAIN PO) Take by mouth.    Marland Kitchen anastrozole (ARIMIDEX) 1 MG tablet Take 1 tablet (1 mg total) by mouth daily. 90 tablet 4  . Ascorbic Acid (VITAMIN C PO) Take by mouth daily.    . Biotin 1000 MCG tablet Take 2,000 mcg by mouth 3 (three) times daily.    . Black Cohosh-SoyIsoflav-Magnol (ESTROVEN MENOPAUSE RELIEF) CAPS Take by mouth.    Marland Kitchen buPROPion (WELLBUTRIN XL) 150 MG 24 hr tablet Take 1 tablet (150 mg total) by mouth daily. 90 tablet 6  . Cholecalciferol (VITAMIN D3) 1000 UNITS tablet Take 2,000 Units by mouth daily.      . diazepam (VALIUM) 5 MG tablet TAKE 1 TABLET TWICE A DAY AS NEEDED FOR ANXIETY OR MUSCLE SPASMS 60 tablet 1  . Melatonin 10 MG TABS Take by mouth.    . Omega-3 Fatty Acids (FISH OIL) 1000 MG CAPS Take by mouth.    . vitamin B-12 (CYANOCOBALAMIN) 1000 MCG tablet Take 1,000 mcg by mouth daily.      Marland Kitchen VITAMIN E PO Take by mouth.    . zolpidem (AMBIEN) 5 MG tablet Take 1 tablet (5 mg total) by mouth at bedtime as needed for sleep. 30 tablet 3   No facility-administered medications prior to visit.     ROS: Review of Systems  Constitutional: Positive for fatigue. Negative for activity change, appetite change, chills and unexpected weight change.  HENT: Negative for congestion, mouth sores and sinus pressure.   Eyes: Negative for visual disturbance.  Respiratory: Negative for cough and chest tightness.   Gastrointestinal: Negative for abdominal pain and nausea.  Genitourinary: Negative for difficulty urinating, frequency and vaginal pain.  Musculoskeletal: Positive for arthralgias, back pain and gait problem.    Skin: Negative for pallor and rash.  Neurological: Negative for dizziness, tremors, weakness, numbness and headaches.  Psychiatric/Behavioral: Positive for decreased concentration. Negative for confusion and sleep disturbance. The patient is nervous/anxious.     Objective:  BP 132/78 (BP Location: Right Arm, Patient Position: Sitting, Cuff Size: Normal)   Pulse (!) 102   Temp 97.9 F (36.6 C) (Oral)   Ht 5\' 4"  (1.626 m)   Wt 184 lb (83.5 kg)   SpO2 94%   BMI 31.58 kg/m   BP Readings from Last 3 Encounters:  05/09/18 132/78  04/09/18 (!) 160/80  01/08/18 137/63    Wt Readings from Last 3 Encounters:  05/09/18 184 lb (83.5 kg)  04/09/18 185 lb (83.9 kg)  01/08/18 191 lb 11.2 oz (87 kg)    Physical Exam  Constitutional: She appears well-developed. No distress.  HENT:  Head: Normocephalic.  Right Ear: External ear normal.  Left Ear: External ear normal.  Nose: Nose normal.  Mouth/Throat: Oropharynx is clear and moist.  Eyes: Pupils are equal, round, and reactive to light. Conjunctivae are normal. Right eye exhibits no discharge. Left eye exhibits no discharge.  Neck: Normal range of motion. Neck supple. No JVD present. No tracheal deviation present. No thyromegaly present.  Cardiovascular: Normal rate, regular rhythm and normal heart sounds.  Pulmonary/Chest: No stridor. No respiratory distress. She has no wheezes.  Abdominal: Soft. Bowel sounds are normal. She exhibits no distension and no mass. There is no tenderness. There is no rebound and no guarding.  Musculoskeletal: She exhibits no edema or tenderness.  Lymphadenopathy:    She has no cervical adenopathy.  Neurological: She displays normal reflexes. No cranial nerve deficit. She exhibits normal muscle tone. Coordination normal.  Skin: No rash noted. No erythema.  Psychiatric: She has a normal mood and affect. Her behavior is normal. Judgment and thought content normal.  LS tendrer  Lab Results  Component Value  Date   WBC 8.0 01/08/2018   HGB 13.9 01/08/2018   HCT 40.7 01/08/2018   PLT 276 01/08/2018   GLUCOSE 110 01/08/2018   CHOL 221 (H) 10/20/2014   TRIG 140.0 10/20/2014   HDL 54.00 10/20/2014   LDLDIRECT 161.3 06/17/2011   LDLCALC 139 (H) 10/20/2014   ALT 18 01/08/2018   AST 17 01/08/2018   NA 137 01/08/2018   K 4.0 01/08/2018   CL 104 01/08/2018   CREATININE 0.82 01/08/2018   BUN 10 01/08/2018   CO2 25 01/08/2018   TSH 2.10 10/20/2014   INR 1.76 (H) 08/29/2009    No results found.  Assessment & Plan:   There are no diagnoses linked to this encounter.   No orders of the defined types were placed in this encounter.    Follow-up: No follow-ups on file.  [name], MD

## 2018-05-09 NOTE — Assessment & Plan Note (Signed)
Zolpidem prn  Potential benefits of a long term benzodiazepines  use as well as potential risks  and complications were explained to the patient and were aknowledged. 

## 2018-05-09 NOTE — Assessment & Plan Note (Signed)
Norco prn  Potential benefits of a long term opioids use as well as potential risks (i.e. addiction risk, apnea etc) and complications (i.e. Somnolence, constipation and others) were explained to the patient and were aknowledged. 

## 2018-05-09 NOTE — Assessment & Plan Note (Signed)
Zenppep 1-2 with food when you eat out

## 2018-05-09 NOTE — Assessment & Plan Note (Signed)
On B12 

## 2018-05-15 ENCOUNTER — Ambulatory Visit: Payer: Medicare Other | Admitting: Internal Medicine

## 2018-06-12 ENCOUNTER — Emergency Department (HOSPITAL_COMMUNITY): Payer: No Typology Code available for payment source

## 2018-06-12 ENCOUNTER — Inpatient Hospital Stay (HOSPITAL_COMMUNITY)
Admission: EM | Admit: 2018-06-12 | Discharge: 2018-06-18 | DRG: 184 | Disposition: A | Discharge status: Skilled Nursing Facility | Payer: No Typology Code available for payment source | Attending: General Surgery | Admitting: General Surgery

## 2018-06-12 ENCOUNTER — Encounter (HOSPITAL_COMMUNITY): Payer: Self-pay

## 2018-06-12 ENCOUNTER — Other Ambulatory Visit: Payer: Self-pay

## 2018-06-12 DIAGNOSIS — D72829 Elevated white blood cell count, unspecified: Secondary | ICD-10-CM

## 2018-06-12 DIAGNOSIS — M4316 Spondylolisthesis, lumbar region: Secondary | ICD-10-CM | POA: Diagnosis present

## 2018-06-12 DIAGNOSIS — S22029A Unspecified fracture of second thoracic vertebra, initial encounter for closed fracture: Secondary | ICD-10-CM | POA: Diagnosis present

## 2018-06-12 DIAGNOSIS — M159 Polyosteoarthritis, unspecified: Secondary | ICD-10-CM | POA: Diagnosis not present

## 2018-06-12 DIAGNOSIS — Z853 Personal history of malignant neoplasm of breast: Secondary | ICD-10-CM

## 2018-06-12 DIAGNOSIS — S129XXA Fracture of neck, unspecified, initial encounter: Secondary | ICD-10-CM | POA: Diagnosis not present

## 2018-06-12 DIAGNOSIS — Z8261 Family history of arthritis: Secondary | ICD-10-CM

## 2018-06-12 DIAGNOSIS — E871 Hypo-osmolality and hyponatremia: Secondary | ICD-10-CM | POA: Diagnosis present

## 2018-06-12 DIAGNOSIS — Z8249 Family history of ischemic heart disease and other diseases of the circulatory system: Secondary | ICD-10-CM

## 2018-06-12 DIAGNOSIS — S12601A Unspecified nondisplaced fracture of seventh cervical vertebra, initial encounter for closed fracture: Secondary | ICD-10-CM | POA: Diagnosis present

## 2018-06-12 DIAGNOSIS — M545 Low back pain, unspecified: Secondary | ICD-10-CM | POA: Diagnosis present

## 2018-06-12 DIAGNOSIS — I10 Essential (primary) hypertension: Secondary | ICD-10-CM

## 2018-06-12 DIAGNOSIS — E669 Obesity, unspecified: Secondary | ICD-10-CM | POA: Diagnosis present

## 2018-06-12 DIAGNOSIS — F418 Other specified anxiety disorders: Secondary | ICD-10-CM | POA: Diagnosis present

## 2018-06-12 DIAGNOSIS — J9811 Atelectasis: Secondary | ICD-10-CM | POA: Diagnosis present

## 2018-06-12 DIAGNOSIS — Z981 Arthrodesis status: Secondary | ICD-10-CM

## 2018-06-12 DIAGNOSIS — M48061 Spinal stenosis, lumbar region without neurogenic claudication: Secondary | ICD-10-CM | POA: Diagnosis present

## 2018-06-12 DIAGNOSIS — E785 Hyperlipidemia, unspecified: Secondary | ICD-10-CM | POA: Diagnosis present

## 2018-06-12 DIAGNOSIS — I700 Atherosclerosis of aorta: Secondary | ICD-10-CM | POA: Diagnosis present

## 2018-06-12 DIAGNOSIS — J939 Pneumothorax, unspecified: Secondary | ICD-10-CM | POA: Diagnosis not present

## 2018-06-12 DIAGNOSIS — F4323 Adjustment disorder with mixed anxiety and depressed mood: Secondary | ICD-10-CM | POA: Diagnosis present

## 2018-06-12 DIAGNOSIS — Z515 Encounter for palliative care: Secondary | ICD-10-CM | POA: Diagnosis present

## 2018-06-12 DIAGNOSIS — T07XXXA Unspecified multiple injuries, initial encounter: Secondary | ICD-10-CM

## 2018-06-12 DIAGNOSIS — Y92410 Unspecified street and highway as the place of occurrence of the external cause: Secondary | ICD-10-CM

## 2018-06-12 DIAGNOSIS — G8929 Other chronic pain: Secondary | ICD-10-CM | POA: Diagnosis present

## 2018-06-12 DIAGNOSIS — Z9049 Acquired absence of other specified parts of digestive tract: Secondary | ICD-10-CM

## 2018-06-12 DIAGNOSIS — Z90711 Acquired absence of uterus with remaining cervical stump: Secondary | ICD-10-CM | POA: Diagnosis not present

## 2018-06-12 DIAGNOSIS — M5440 Lumbago with sciatica, unspecified side: Secondary | ICD-10-CM | POA: Diagnosis not present

## 2018-06-12 DIAGNOSIS — N390 Urinary tract infection, site not specified: Secondary | ICD-10-CM | POA: Diagnosis present

## 2018-06-12 DIAGNOSIS — S22009A Unspecified fracture of unspecified thoracic vertebra, initial encounter for closed fracture: Secondary | ICD-10-CM

## 2018-06-12 DIAGNOSIS — S22019A Unspecified fracture of first thoracic vertebra, initial encounter for closed fracture: Secondary | ICD-10-CM | POA: Diagnosis present

## 2018-06-12 DIAGNOSIS — R52 Pain, unspecified: Secondary | ICD-10-CM | POA: Diagnosis not present

## 2018-06-12 DIAGNOSIS — Z87891 Personal history of nicotine dependence: Secondary | ICD-10-CM

## 2018-06-12 DIAGNOSIS — S32039A Unspecified fracture of third lumbar vertebra, initial encounter for closed fracture: Secondary | ICD-10-CM | POA: Diagnosis present

## 2018-06-12 DIAGNOSIS — Z96643 Presence of artificial hip joint, bilateral: Secondary | ICD-10-CM | POA: Diagnosis present

## 2018-06-12 DIAGNOSIS — M549 Dorsalgia, unspecified: Secondary | ICD-10-CM

## 2018-06-12 DIAGNOSIS — S2239XA Fracture of one rib, unspecified side, initial encounter for closed fracture: Secondary | ICD-10-CM

## 2018-06-12 DIAGNOSIS — M5136 Other intervertebral disc degeneration, lumbar region: Secondary | ICD-10-CM | POA: Diagnosis present

## 2018-06-12 DIAGNOSIS — S270XXA Traumatic pneumothorax, initial encounter: Secondary | ICD-10-CM | POA: Diagnosis present

## 2018-06-12 DIAGNOSIS — S2242XA Multiple fractures of ribs, left side, initial encounter for closed fracture: Secondary | ICD-10-CM | POA: Diagnosis present

## 2018-06-12 DIAGNOSIS — K5730 Diverticulosis of large intestine without perforation or abscess without bleeding: Secondary | ICD-10-CM | POA: Diagnosis present

## 2018-06-12 LAB — CDS SEROLOGY

## 2018-06-12 LAB — ETHANOL: Alcohol, Ethyl (B): <10 mg/dL (ref <10)

## 2018-06-12 LAB — I-STAT CHEM 8, ED
BUN: 16 mg/dL (ref 8–23)
Calcium, Ion: 1.19 mmol/L (ref 1.15–1.40)
Chloride: 104 mmol/L (ref 98–111)
Creatinine, Ser: 0.8 mg/dL (ref 0.44–1.00)
Glucose, Bld: 148 mg/dL — ABNORMAL HIGH (ref 70–99)
HCT: 44 % (ref 36.0–46.0)
Hemoglobin: 15 g/dL (ref 12.0–15.0)
Potassium: 3.8 mmol/L (ref 3.5–5.1)
Sodium: 138 mmol/L (ref 135–145)
TCO2: 24 mmol/L (ref 22–32)

## 2018-06-12 LAB — URINALYSIS, ROUTINE W REFLEX MICROSCOPIC
Bacteria, UA: NONE SEEN
Bilirubin Urine: NEGATIVE
Glucose, UA: NEGATIVE mg/dL
Ketones, ur: NEGATIVE mg/dL
Nitrite: NEGATIVE
Protein, ur: NEGATIVE mg/dL
Specific Gravity, Urine: 1.045 — ABNORMAL HIGH (ref 1.005–1.030)
pH: 5 (ref 5.0–8.0)

## 2018-06-12 LAB — CBC
HCT: 45.1 % (ref 36.0–46.0)
HCT: 41.8 % (ref 36.0–46.0)
Hemoglobin: 13.9 g/dL (ref 12.0–15.0)
Hemoglobin: 13.4 g/dL (ref 12.0–15.0)
MCH: 30.3 pg (ref 26.0–34.0)
MCH: 30.7 pg (ref 26.0–34.0)
MCHC: 30.8 g/dL (ref 30.0–36.0)
MCHC: 32.1 g/dL (ref 30.0–36.0)
MCV: 98.3 fL (ref 80.0–100.0)
MCV: 95.9 fL (ref 80.0–100.0)
Platelets: 327 10*3/uL (ref 150–400)
Platelets: 323 10*3/uL (ref 150–400)
RBC: 4.59 MIL/uL (ref 3.87–5.11)
RBC: 4.36 MIL/uL (ref 3.87–5.11)
RDW: 12.5 % (ref 11.5–15.5)
RDW: 12.5 % (ref 11.5–15.5)
WBC: 10.3 10*3/uL (ref 4.0–10.5)
WBC: 17.6 10*3/uL — ABNORMAL HIGH (ref 4.0–10.5)
nRBC: 0 % (ref 0.0–0.2)
nRBC: 0 % (ref 0.0–0.2)

## 2018-06-12 LAB — COMPREHENSIVE METABOLIC PANEL
ALT: 33 U/L (ref 0–44)
AST: 44 U/L — ABNORMAL HIGH (ref 15–41)
Albumin: 3.9 g/dL (ref 3.5–5.0)
Alkaline Phosphatase: 81 U/L (ref 38–126)
Anion gap: 5 (ref 5–15)
BUN: 14 mg/dL (ref 8–23)
CO2: 27 mmol/L (ref 22–32)
Calcium: 9.8 mg/dL (ref 8.9–10.3)
Chloride: 104 mmol/L (ref 98–111)
Creatinine, Ser: 0.84 mg/dL (ref 0.44–1.00)
GFR calc Af Amer: >60 mL/min (ref >60)
GFR calc non Af Amer: >60 mL/min (ref >60)
Glucose, Bld: 148 mg/dL — ABNORMAL HIGH (ref 70–99)
Potassium: 3.8 mmol/L (ref 3.5–5.1)
Sodium: 136 mmol/L (ref 135–145)
Total Bilirubin: 0.5 mg/dL (ref 0.3–1.2)
Total Protein: 7.1 g/dL (ref 6.5–8.1)

## 2018-06-12 LAB — CREATININE, SERUM
Creatinine, Ser: 0.74 mg/dL (ref 0.44–1.00)
GFR calc Af Amer: >60 mL/min (ref >60)
GFR calc non Af Amer: >60 mL/min (ref >60)

## 2018-06-12 LAB — PROTIME-INR
INR: 1.06
Prothrombin Time: 13.7 seconds (ref 11.4–15.2)

## 2018-06-12 LAB — SAMPLE TO BLOOD BANK

## 2018-06-12 MED ORDER — IOHEXOL 300 MG/ML  SOLN
100.0000 mL | Freq: Once | INTRAMUSCULAR | Status: AC | PRN
  Administered 2018-06-12: 100 mL via INTRAVENOUS

## 2018-06-12 MED ORDER — FENTANYL CITRATE (PF) 100 MCG/2ML IJ SOLN
50.0000 ug | Freq: Once | INTRAMUSCULAR | Status: AC
  Administered 2018-06-12: 50 ug via INTRAMUSCULAR

## 2018-06-12 MED ORDER — LORAZEPAM 2 MG/ML IJ SOLN
1.0000 mg | Freq: Once | INTRAMUSCULAR | Status: AC
  Administered 2018-06-12: 1 mg via INTRAVENOUS
  Filled 2018-06-12: qty 1

## 2018-06-12 MED ORDER — ONDANSETRON HCL 4 MG/2ML IJ SOLN
4.0000 mg | Freq: Four times a day (QID) | INTRAMUSCULAR | Status: DC | PRN
  Administered 2018-06-12: 4 mg via INTRAVENOUS
  Filled 2018-06-12 (×2): qty 2

## 2018-06-12 MED ORDER — MORPHINE SULFATE (PF) 4 MG/ML IV SOLN
4.0000 mg | Freq: Once | INTRAVENOUS | Status: AC
  Administered 2018-06-12: 4 mg via INTRAVENOUS
  Filled 2018-06-12: qty 1

## 2018-06-12 MED ORDER — BUPROPION HCL ER (XL) 150 MG PO TB24
150.0000 mg | ORAL_TABLET | Freq: Every day | ORAL | Status: DC
  Administered 2018-06-13 – 2018-06-18 (×6): 150 mg via ORAL
  Filled 2018-06-12 (×6): qty 1

## 2018-06-12 MED ORDER — ONDANSETRON 4 MG PO TBDP: 4.0000 mg | ORAL_TABLET | Freq: Four times a day (QID) | ORAL | Status: DC | PRN

## 2018-06-12 MED ORDER — HYDROMORPHONE HCL 1 MG/ML IJ SOLN
0.5000 mg | Freq: Once | INTRAMUSCULAR | Status: AC
  Administered 2018-06-12: 0.5 mg via INTRAVENOUS
  Filled 2018-06-12: qty 1

## 2018-06-12 MED ORDER — METHOCARBAMOL 1000 MG/10ML IJ SOLN
500.0000 mg | Freq: Three times a day (TID) | INTRAVENOUS | Status: DC | PRN
  Filled 2018-06-12 (×2): qty 5

## 2018-06-12 MED ORDER — MORPHINE SULFATE (PF) 4 MG/ML IV SOLN
4.0000 mg | INTRAVENOUS | Status: DC | PRN
  Administered 2018-06-12 – 2018-06-13 (×2): 4 mg via INTRAVENOUS
  Filled 2018-06-12 (×2): qty 1

## 2018-06-12 MED ORDER — HYDROCODONE-ACETAMINOPHEN 5-325 MG PO TABS: 1.0000 | ORAL_TABLET | ORAL | Status: DC | PRN

## 2018-06-12 MED ORDER — ENOXAPARIN SODIUM 40 MG/0.4ML ~~LOC~~ SOLN
40.0000 mg | SUBCUTANEOUS | Status: DC
  Administered 2018-06-13 – 2018-06-18 (×6): 40 mg via SUBCUTANEOUS
  Filled 2018-06-12 (×6): qty 0.4

## 2018-06-12 MED ORDER — SODIUM CHLORIDE 0.9% FLUSH
3.0000 mL | Freq: Two times a day (BID) | INTRAVENOUS | Status: DC
  Administered 2018-06-12 – 2018-06-16 (×5): 3 mL via INTRAVENOUS

## 2018-06-12 MED ORDER — DIAZEPAM 5 MG PO TABS
5.0000 mg | ORAL_TABLET | Freq: Four times a day (QID) | ORAL | Status: DC | PRN
  Administered 2018-06-12 – 2018-06-18 (×5): 5 mg via ORAL
  Filled 2018-06-12 (×5): qty 1

## 2018-06-12 MED ORDER — KETOROLAC TROMETHAMINE 30 MG/ML IJ SOLN
15.0000 mg | Freq: Once | INTRAMUSCULAR | Status: AC
  Administered 2018-06-12: 15 mg via INTRAVENOUS
  Filled 2018-06-12: qty 1

## 2018-06-12 MED ORDER — SODIUM CHLORIDE 0.9 % IV SOLN: 250.0000 mL | INTRAVENOUS | Status: DC | PRN

## 2018-06-12 MED ORDER — ZOLPIDEM TARTRATE 5 MG PO TABS
5.0000 mg | ORAL_TABLET | Freq: Every evening | ORAL | Status: DC | PRN
  Administered 2018-06-12 – 2018-06-17 (×5): 5 mg via ORAL
  Filled 2018-06-12 (×5): qty 1

## 2018-06-12 MED ORDER — ACETAMINOPHEN 325 MG PO TABS: 650.0000 mg | ORAL_TABLET | ORAL | Status: DC | PRN

## 2018-06-12 MED ORDER — HYDROCODONE-ACETAMINOPHEN 5-325 MG PO TABS
2.0000 | ORAL_TABLET | ORAL | Status: DC | PRN
  Administered 2018-06-12 – 2018-06-13 (×3): 2 via ORAL
  Filled 2018-06-12 (×3): qty 2

## 2018-06-12 MED ORDER — FENTANYL CITRATE (PF) 100 MCG/2ML IJ SOLN
INTRAMUSCULAR | Status: AC
  Administered 2018-06-12: 50 ug via INTRAMUSCULAR
  Filled 2018-06-12: qty 2

## 2018-06-12 MED ORDER — IBUPROFEN 400 MG PO TABS
400.0000 mg | ORAL_TABLET | Freq: Four times a day (QID) | ORAL | Status: DC | PRN
  Administered 2018-06-13: 400 mg via ORAL
  Filled 2018-06-12: qty 1

## 2018-06-12 MED ORDER — ANASTROZOLE 1 MG PO TABS
1.0000 mg | ORAL_TABLET | Freq: Every day | ORAL | Status: DC
  Administered 2018-06-13 – 2018-06-18 (×6): 1 mg via ORAL
  Filled 2018-06-12 (×6): qty 1

## 2018-06-12 MED ORDER — SODIUM CHLORIDE 0.9% FLUSH: 3.0000 mL | INTRAVENOUS | Status: DC | PRN
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
## 2018-06-12 NOTE — ED Notes (Signed)
Patient transported to CT 
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
## 2018-06-13 ENCOUNTER — Encounter (HOSPITAL_COMMUNITY): Payer: Self-pay | Admitting: General Practice

## 2018-06-13 ENCOUNTER — Inpatient Hospital Stay (HOSPITAL_COMMUNITY): Payer: No Typology Code available for payment source

## 2018-06-13 DIAGNOSIS — J939 Pneumothorax, unspecified: Secondary | ICD-10-CM

## 2018-06-13 LAB — CBC
HCT: 39.1 % (ref 36.0–46.0)
Hemoglobin: 13 g/dL (ref 12.0–15.0)
MCH: 31.9 pg (ref 26.0–34.0)
MCHC: 33.2 g/dL (ref 30.0–36.0)
MCV: 96.1 fL (ref 80.0–100.0)
Platelets: 278 10*3/uL (ref 150–400)
RBC: 4.07 MIL/uL (ref 3.87–5.11)
RDW: 12.7 % (ref 11.5–15.5)
WBC: 12.9 10*3/uL — ABNORMAL HIGH (ref 4.0–10.5)
nRBC: 0 % (ref 0.0–0.2)

## 2018-06-13 LAB — BASIC METABOLIC PANEL
Anion gap: 7 (ref 5–15)
BUN: 15 mg/dL (ref 8–23)
CO2: 22 mmol/L (ref 22–32)
Calcium: 9.2 mg/dL (ref 8.9–10.3)
Chloride: 103 mmol/L (ref 98–111)
Creatinine, Ser: 0.77 mg/dL (ref 0.44–1.00)
GFR calc Af Amer: >60 mL/min (ref >60)
GFR calc non Af Amer: >60 mL/min (ref >60)
Glucose, Bld: 115 mg/dL — ABNORMAL HIGH (ref 70–99)
Potassium: 3.9 mmol/L (ref 3.5–5.1)
Sodium: 132 mmol/L — ABNORMAL LOW (ref 135–145)

## 2018-06-13 MED ORDER — POLYETHYLENE GLYCOL 3350 17 G PO PACK
17.0000 g | PACK | Freq: Every day | ORAL | Status: DC
  Administered 2018-06-13 – 2018-06-16 (×4): 17 g via ORAL
  Filled 2018-06-13 (×4): qty 1

## 2018-06-13 MED ORDER — MORPHINE SULFATE (PF) 2 MG/ML IV SOLN: 1.0000 mg | INTRAVENOUS | Status: DC | PRN

## 2018-06-13 MED ORDER — OXYCODONE HCL 5 MG PO TABS
5.0000 mg | ORAL_TABLET | ORAL | Status: DC | PRN
  Administered 2018-06-13 – 2018-06-16 (×10): 10 mg via ORAL
  Filled 2018-06-13 (×11): qty 2

## 2018-06-13 MED ORDER — DOCUSATE SODIUM 100 MG PO CAPS
100.0000 mg | ORAL_CAPSULE | Freq: Every day | ORAL | Status: DC
  Administered 2018-06-13 – 2018-06-16 (×4): 100 mg via ORAL
  Filled 2018-06-13 (×4): qty 1

## 2018-06-13 MED ORDER — METHOCARBAMOL 500 MG PO TABS
500.0000 mg | ORAL_TABLET | Freq: Three times a day (TID) | ORAL | Status: DC
  Administered 2018-06-13 – 2018-06-14 (×6): 500 mg via ORAL
  Filled 2018-06-13 (×6): qty 1

## 2018-06-13 MED ORDER — ACETAMINOPHEN 500 MG PO TABS
1000.0000 mg | ORAL_TABLET | Freq: Four times a day (QID) | ORAL | Status: DC
  Administered 2018-06-13 – 2018-06-18 (×20): 1000 mg via ORAL
  Filled 2018-06-13 (×21): qty 2

## 2018-06-13 MED ORDER — SODIUM CHLORIDE 0.9 % IV SOLN: 250.0000 mL | INTRAVENOUS | Status: DC | PRN
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
## 2018-06-14 LAB — BASIC METABOLIC PANEL
Anion gap: 8 (ref 5–15)
BUN: 13 mg/dL (ref 8–23)
CO2: 24 mmol/L (ref 22–32)
Calcium: 9.4 mg/dL (ref 8.9–10.3)
Chloride: 98 mmol/L (ref 98–111)
Creatinine, Ser: 0.75 mg/dL (ref 0.44–1.00)
GFR calc Af Amer: >60 mL/min (ref >60)
GFR calc non Af Amer: >60 mL/min (ref >60)
Glucose, Bld: 96 mg/dL (ref 70–99)
Potassium: 4.1 mmol/L (ref 3.5–5.1)
Sodium: 130 mmol/L — ABNORMAL LOW (ref 135–145)
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
## 2018-06-15 ENCOUNTER — Inpatient Hospital Stay (HOSPITAL_COMMUNITY): Payer: No Typology Code available for payment source

## 2018-06-15 LAB — URINALYSIS, ROUTINE W REFLEX MICROSCOPIC
Bilirubin Urine: NEGATIVE
Glucose, UA: NEGATIVE mg/dL
Ketones, ur: 20 mg/dL — AB
Nitrite: POSITIVE — AB
Protein, ur: NEGATIVE mg/dL
Specific Gravity, Urine: 1.011 (ref 1.005–1.030)
pH: 5 (ref 5.0–8.0)

## 2018-06-15 LAB — BASIC METABOLIC PANEL
Anion gap: 12 (ref 5–15)
BUN: 10 mg/dL (ref 8–23)
CO2: 20 mmol/L — ABNORMAL LOW (ref 22–32)
Calcium: 9.6 mg/dL (ref 8.9–10.3)
Chloride: 102 mmol/L (ref 98–111)
Creatinine, Ser: 0.81 mg/dL (ref 0.44–1.00)
GFR calc Af Amer: >60 mL/min (ref >60)
GFR calc non Af Amer: >60 mL/min (ref >60)
Glucose, Bld: 126 mg/dL — ABNORMAL HIGH (ref 70–99)
Potassium: 4.2 mmol/L (ref 3.5–5.1)
Sodium: 134 mmol/L — ABNORMAL LOW (ref 135–145)

## 2018-06-15 LAB — CBC
HCT: 42.1 % (ref 36.0–46.0)
Hemoglobin: 14.1 g/dL (ref 12.0–15.0)
MCH: 31.8 pg (ref 26.0–34.0)
MCHC: 33.5 g/dL (ref 30.0–36.0)
MCV: 94.8 fL (ref 80.0–100.0)
Platelets: 324 10*3/uL (ref 150–400)
RBC: 4.44 MIL/uL (ref 3.87–5.11)
RDW: 12.3 % (ref 11.5–15.5)
WBC: 11.8 10*3/uL — ABNORMAL HIGH (ref 4.0–10.5)
nRBC: 0 % (ref 0.0–0.2)

## 2018-06-15 MED ORDER — CIPROFLOXACIN HCL 500 MG PO TABS
500.0000 mg | ORAL_TABLET | Freq: Two times a day (BID) | ORAL | Status: AC
  Administered 2018-06-15 – 2018-06-17 (×6): 500 mg via ORAL
  Filled 2018-06-15 (×6): qty 1

## 2018-06-15 MED ORDER — METHOCARBAMOL 750 MG PO TABS
750.0000 mg | ORAL_TABLET | Freq: Three times a day (TID) | ORAL | Status: DC
  Administered 2018-06-15 – 2018-06-16 (×4): 750 mg via ORAL
  Filled 2018-06-15 (×4): qty 1

## 2018-06-15 MED ORDER — IBUPROFEN 400 MG PO TABS
400.0000 mg | ORAL_TABLET | Freq: Three times a day (TID) | ORAL | Status: DC
  Administered 2018-06-15 – 2018-06-18 (×10): 400 mg via ORAL
  Filled 2018-06-15 (×10): qty 1

## 2018-06-15 MED ORDER — TRAMADOL HCL 50 MG PO TABS
50.0000 mg | ORAL_TABLET | Freq: Four times a day (QID) | ORAL | Status: DC
  Administered 2018-06-15 – 2018-06-16 (×3): 50 mg via ORAL
  Filled 2018-06-15 (×4): qty 1

## 2018-06-15 NOTE — Care Management Important Message (Signed)
Important Message  Patient Details  Name: [name] MRN: [id number] Date of Birth: 12/23/1940   Medicare Important Message Given:  Yes    Carlia Bomkamp Montine [address] 06/15/2018, 4:31 PM

## 2018-06-15 NOTE — Plan of Care (Signed)
  Problem: Pain Managment: Goal: General experience of comfort will improve Outcome: Not Progressing   
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
## 2018-06-15 NOTE — Progress Notes (Signed)
  Patient ID: [name], female   DOB: 1941-05-18, 77 y.o.   MRN: [id number]  BP 132/70 (BP Location: Right Arm)   Pulse 94   Temp 97.8 F (36.6 C) (Oral)   Resp 15   Ht 5\' 4"  (1.626 m)   Wt 83.9 kg   SpO2 97%   BMI 31.76 kg/m  Alert and oriented. No neurologic deficits No need to brace, fractures are all stable

## 2018-06-15 NOTE — NC FL2 (Signed)
[place] LEVEL OF CARE SCREENING TOOL     IDENTIFICATION  Patient Name: [name] Birthdate: July 11, 1941 Sex: female Admission Date (Current Location): 06/12/2018  [facility] and [place] Number:  Herbalist and Address:  [facility] [place] [address] [place], [place] [address]      Provider Number: [id number]  Attending Physician Name and Address:  Md, Trauma, MD  Relative Name and Phone Number:       Current Level of Care: Hospital Recommended Level of Care: [place] Prior Approval Number:    Date Approved/Denied:   PASRR Number: [id number]  Discharge Plan: SNF    Current Diagnoses: Patient Active Problem List   Diagnosis Date Noted  . Multiple fractures of ribs, left side, initial encounter for closed fracture 06/12/2018  . Diarrhea 05/09/2018  . Malignant neoplasm of upper-outer quadrant of left breast in female, estrogen receptor positive ([place]) 01/04/2017  . 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 Lichen sclerosus et atrophicus 06/16/2016  . Osteopenia 06/16/2016  . Radiculopathy of lumbar region 03/03/2016  . Well adult exam 09/28/2015  . Conjunctivitis 06/12/2015  . Insomnia 02/09/2015  . Osteoarthritis of both knees 02/09/2015  . Hemorrhoid 02/09/2015  . Memory impairment 10/20/2014  . Abdominal pain 10/20/2014  . B12 deficiency 11/13/2013  . Right maxillary sinusitis 10/28/2013  . Chest pain 07/17/2013  . Situational mixed anxiety and depressive disorder 10/28/2012  . Vaginal atrophy 04/26/2011  . Post-menopausal 04/26/2011  . PARESTHESIA 09/28/2010  . Obesity 03/15/2010  . HIP PAIN 03/15/2010  . ALOPECIA 12/08/2009  . SINUSITIS, ACUTE 07/20/2009  . TOBACCO USE, QUIT 06/12/2009  . INGUINAL PAIN, RIGHT 08/12/2008  . UPPER RESPIRATORY INFECTION (URI) 09/19/2007  . NECK PAIN 06/06/2007  . Vitamin D deficiency 05/02/2007  . Dyslipidemia 05/02/2007  . ANEMIA, VITAMIN B12 DEFICIENCY  NEC 05/02/2007  . Anxiety state 05/02/2007  . OSTEOARTHRITIS 05/02/2007  . Chronic low back pain 05/02/2007    Orientation RESPIRATION BLADDER Height & Weight     Self, Time, Situation, Place  Normal External catheter Weight: 185 lb (83.9 kg) Height:  5\' 4"  (162.6 cm)  BEHAVIORAL SYMPTOMS/MOOD NEUROLOGICAL BOWEL NUTRITION STATUS      Continent Diet(Regular Diet with thin liquds)  AMBULATORY STATUS COMMUNICATION OF NEEDS Skin   Limited Assist Verbally Normal                       Personal Care Assistance Level of Assistance  Bathing, Feeding, Dressing Bathing Assistance: Limited assistance Feeding assistance: Limited assistance Dressing Assistance: Limited assistance     Functional Limitations Info  Sight, Hearing, Speech Sight Info: Adequate Hearing Info: Adequate Speech Info: Adequate    SPECIAL CARE FACTORS FREQUENCY  PT (By licensed PT), OT (By licensed OT)     PT Frequency: 3 times a week OT Frequency: 3 times a week            Contractures Contractures Info: Not present    Additional Factors Info  Code Status, Allergies Code Status Info: Full Code Allergies Info: Effexor Xr Venlafaxine Hcl Er, Augmentin Amoxicillin-pot Clavulanate, Cymbalta Duloxetine Hcl, Ezetimibe-simvastatin, Phentermine Hcl           Current Medications (06/15/2018):  This is the current hospital active medication list Current Facility-Administered Medications  Medication Dose Route Frequency Provider Last Rate Last Dose  . 0.9 %  sodium chloride infusion  250 mL Intravenous PRN Saverio Danker, PA-C      .  acetaminophen (TYLENOL) tablet 1,000 mg  1,000 mg Oral Q6H Saverio Danker, PA-C   1,000 mg at 06/15/18 8110  . anastrozole (ARIMIDEX) tablet 1 mg  1 mg Oral Daily Georganna Skeans, MD   1 mg at 06/15/18 0958  . buPROPion (WELLBUTRIN XL) 24 hr tablet 150 mg  150 mg Oral Daily Georganna Skeans, MD   150 mg at 06/15/18 0958  . diazepam (VALIUM) tablet 5 mg  5 mg Oral Q6H PRN  Georganna Skeans, MD   5 mg at 06/15/18 0532  . docusate sodium (COLACE) capsule 100 mg  100 mg Oral Daily Saverio Danker, PA-C   100 mg at 06/15/18 3159  . enoxaparin (LOVENOX) injection 40 mg  40 mg Subcutaneous Q24H Georganna Skeans, MD   40 mg at 06/15/18 0958  . ibuprofen (ADVIL,MOTRIN) tablet 400 mg  400 mg Oral TID Saverio Danker, PA-C   400 mg at 06/15/18 4585  . methocarbamol (ROBAXIN) tablet 750 mg  750 mg Oral TID Saverio Danker, PA-C   750 mg at 06/15/18 9292  . morphine 2 MG/ML injection 1-2 mg  1-2 mg Intravenous Q3H PRN Saverio Danker, PA-C      . ondansetron (ZOFRAN-ODT) disintegrating tablet 4 mg  4 mg Oral Q6H PRN Georganna Skeans, MD       Or  . ondansetron [facility]) injection 4 mg  4 mg Intravenous Q6H PRN Georganna Skeans, MD   4 mg at 06/12/18 2215  . oxyCODONE (Oxy IR/ROXICODONE) immediate release tablet 5-10 mg  5-10 mg Oral Q4H PRN Saverio Danker, PA-C   10 mg at 06/15/18 0958  . polyethylene glycol (MIRALAX / GLYCOLAX) packet 17 g  17 g Oral Daily Saverio Danker, PA-C   17 g at 06/15/18 0959  . sodium chloride flush (NS) 0.9 % injection 3 mL  3 mL Intravenous Q12H Georganna Skeans, MD   3 mL at 06/15/18 0959  . sodium chloride flush (NS) 0.9 % injection 3 mL  3 mL Intravenous PRN Georganna Skeans, MD      . traMADol Veatrice [facility]bon) tablet 50 mg  50 mg Oral Q6H Saverio Danker, PA-C      . zolpidem (AMBIEN) tablet 5 mg  5 mg Oral QHS PRN Georganna Skeans, MD   5 mg at 06/13/18 2210     Discharge Medications: Please see discharge summary for a list of discharge medications.  Relevant Imaging Results:  Relevant Lab Results:   Additional Information SSN [id number]   Barbette Or, [place]

## 2018-06-16 DIAGNOSIS — I10 Essential (primary) hypertension: Secondary | ICD-10-CM

## 2018-06-16 DIAGNOSIS — R52 Pain, unspecified: Secondary | ICD-10-CM

## 2018-06-16 DIAGNOSIS — S22009A Unspecified fracture of unspecified thoracic vertebra, initial encounter for closed fracture: Secondary | ICD-10-CM

## 2018-06-16 DIAGNOSIS — J939 Pneumothorax, unspecified: Secondary | ICD-10-CM

## 2018-06-16 DIAGNOSIS — F4323 Adjustment disorder with mixed anxiety and depressed mood: Secondary | ICD-10-CM

## 2018-06-16 DIAGNOSIS — M159 Polyosteoarthritis, unspecified: Secondary | ICD-10-CM

## 2018-06-16 DIAGNOSIS — S129XXA Fracture of neck, unspecified, initial encounter: Secondary | ICD-10-CM

## 2018-06-16 DIAGNOSIS — T07XXXA Unspecified multiple injuries, initial encounter: Secondary | ICD-10-CM

## 2018-06-16 DIAGNOSIS — G8929 Other chronic pain: Secondary | ICD-10-CM

## 2018-06-16 DIAGNOSIS — E871 Hypo-osmolality and hyponatremia: Secondary | ICD-10-CM

## 2018-06-16 DIAGNOSIS — M5440 Lumbago with sciatica, unspecified side: Secondary | ICD-10-CM

## 2018-06-16 DIAGNOSIS — D72829 Elevated white blood cell count, unspecified: Secondary | ICD-10-CM

## 2018-06-16 DIAGNOSIS — S2242XA Multiple fractures of ribs, left side, initial encounter for closed fracture: Principal | ICD-10-CM

## 2018-06-16 DIAGNOSIS — N390 Urinary tract infection, site not specified: Secondary | ICD-10-CM

## 2018-06-16 MED ORDER — METHOCARBAMOL 500 MG PO TABS
1000.0000 mg | ORAL_TABLET | Freq: Three times a day (TID) | ORAL | Status: DC
  Administered 2018-06-16 – 2018-06-18 (×6): 1000 mg via ORAL
  Filled 2018-06-16 (×6): qty 2

## 2018-06-16 MED ORDER — POLYETHYLENE GLYCOL 3350 17 G PO PACK
17.0000 g | PACK | Freq: Two times a day (BID) | ORAL | Status: DC
  Administered 2018-06-17 – 2018-06-18 (×2): 17 g via ORAL
  Filled 2018-06-16 (×2): qty 1

## 2018-06-16 MED ORDER — GABAPENTIN 300 MG PO CAPS
300.0000 mg | ORAL_CAPSULE | Freq: Two times a day (BID) | ORAL | Status: DC
  Administered 2018-06-16 – 2018-06-18 (×5): 300 mg via ORAL
  Filled 2018-06-16 (×5): qty 1

## 2018-06-16 MED ORDER — OXYCODONE HCL 5 MG PO TABS
5.0000 mg | ORAL_TABLET | ORAL | Status: DC | PRN
  Administered 2018-06-16 – 2018-06-17 (×2): 15 mg via ORAL
  Administered 2018-06-17: 10 mg via ORAL
  Administered 2018-06-18 (×2): 15 mg via ORAL
  Filled 2018-06-16 (×3): qty 3
  Filled 2018-06-16: qty 2
  Filled 2018-06-16: qty 3

## 2018-06-16 NOTE — Progress Notes (Signed)
Pt alert and oriented x4, sitting in chair, using incentive spirometry. Assessed vitals. S-RN Did not to a head to toe assessment as pt vocalize pain of 10/10. Student RN asked if she would like pillow to gently brace her left side when she moved. Pt vocalized she would like to try that. Pillow was propped under right forearm by S-RN. Pt verbalized immediate partial relief of pain. Pt verbalized 6/10 on pain scale and increased overall comfort. Pt verbalized distress about the increased amount of hospital staff visiting her and that she couldn't remember who everyone was. Pt was coached that information on meds, follow-up instructions, and other educational information on plan of care would all be provided in written form upon discharge by hospital staff. Pt verbalized concern that several regular doctor visits (OB yearly, cancer Dr) in the upcoming week would need to be rescheduled. S-RN inquired if Pt had close family member who had access to all of that information to reschedule. Pt verbalized that was a good idea and she'd look into that. S-RN inquired to nourishment needs, replenished fluid PO nourishment, and educated pt on location of call button if she required assistance. Leonie Man, Student-RN
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
## 2018-06-16 NOTE — Progress Notes (Addendum)
[name] [id number] October 10, 1940  CARE TEAM:  PCP: Cassandria Anger, MD  Outpatient Care Team: Patient Care Team: Plotnikov, Evie Lacks, MD as PCP - Lisa Roca, MD as Attending Physician (Neurosurgery) Jovita Kussmaul, MD as Consulting Physician (General Surgery) Rolm Bookbinder, MD as Consulting Physician (Dermatology) Bensimhon, Shaune Pascal, MD as Consulting Physician (Cardiology) Fontaine, Belinda Block, MD as Consulting Physician (Gynecology)  Inpatient Treatment Team: Treatment Team: Attending Provider: Md, Trauma, MD; Pharmacy Technician: Luciano Cutter, CPhT; Rounding Team: Md, Trauma, MD; Registered Nurse: Fransisco Hertz, RN; Technician: Raenette Rover, NT; Registered Nurse: Vidal Schwalbe, RN; Registered Nurse: Candida Peeling, RN; Registered Nurse: Westly Pam, RN; Technician: Merton Border, NT; Case Manager: Reinaldo Raddle, RN; Technician: Duard Brady, NT   Problem List:   Principal Problem:   Multiple fractures of ribs, left side, initial encounter for closed fracture Active Problems:   Obesity   Chronic low back pain   Situational mixed anxiety and depressive disorder   Closed fracture of transverse process of thoracic vertebra (HCC)   Pneumothorax, left   Cervical transverse process fracture (HCC)   UTI (urinary tract infection)      * No surgery found *      Assessment  Struggle with pain control with transverse process fractures x4 and rib fractures x10.  [facility] Stay = 4 days)  Plan:  Improve pain control.  Treat urinary tract infection.  Worked with physical therapy.  Consider planning for rehab versus skilled nursing facility.  Possible transfer on Monday if improves.  Aggressive bowel regimen.  Anxiolysis/support  -VTE prophylaxis- SCDs, etc -mobilize as tolerated to help recovery  20 minutes spent in review, evaluation, examination, counseling, and coordination of care.  More than 50% of  that time was spent in counseling.  06/16/2018    Subjective: (Chief complaint)  Up in chair with help.  Extreme pain on left chest.  Still with pain and discomfort on right lower extremity.  Slightly better overall.  Objective:  Vital signs:  Vitals:   06/15/18 1403 06/15/18 2241 06/16/18 0547 06/16/18 1000  BP: 132/70 (!) 142/58 (!) 160/68 (!) 142/71  Pulse: 94 87 88 81  Resp: _0 Temp: 97.8 F (36.6 C) 97.9 F (36.6 C) 97.9 F (36.6 C) 97.9 F (36.6 C)  TempSrc: Oral Oral Oral Oral  SpO2: 97% 95% 95% 95%  Weight:      Height:        Last BM Date: 06/12/18  Intake/Output   Yesterday:  11/15 0701 - 11/16 0700 In: 500 [P.O.:500] Out: -  This shift:  Total I/O In: 118 [P.O.:118] Out: -   Bowel function:  Flatus: YES  BM:  No  Drain: (No drain)   Physical Exam:  General: Pt awake/alert/oriented x4 in mild acute distress Eyes: PERRL, normal EOM.  Sclera clear.  No icterus Neuro: CN II-XII intact w/o focal sensory/motor deficits.  Lymph: No head/neck/groin lymphadenopathy Psych:  No delerium/psychosis/paranoia HENT: Normocephalic, Mucus membranes moist.  No thrush Neck: Supple, No tracheal deviation  Apsen collar in place but somewhat shifted.  I repositioned the collar  Chest: Significant left-sided chest wall pain even with light touch and guarding.  Fair excursion.  No conversational dyspnea.  CV:  Pulses intact.  Regular rhythm MS: Normal AROM mjr joints.  No obvious deformity  Abdomen: Soft.  Nondistended.  Nontender.  No evidence of peritonitis.  No incarcerated hernias.  Ext:  No deformity.  No mjr edema.  No cyanosis Skin: No petechiae / purpura  Results:   Labs: Results for orders placed or performed during the hospital encounter of 06/12/18 (from the past 48 hour(s))  CBC     Status: Abnormal   Collection Time: 06/15/18  9:38 AM  Result Value Ref Range   WBC 11.8 (H) 4.0 - 10.5 K/uL   RBC 4.44 3.87 - 5.11 MIL/uL    Hemoglobin 14.1 12.0 - 15.0 g/dL   HCT 42.1 36.0 - 46.0 %   MCV 94.8 80.0 - 100.0 fL   MCH 31.8 26.0 - 34.0 pg   MCHC 33.5 30.0 - 36.0 g/dL   RDW 12.3 11.5 - 15.5 %   Platelets 324 150 - 400 K/uL   nRBC 0.0 0.0 - 0.2 %    Comment: Performed at [facility], [place] [address] [place], [place] [address]  Basic metabolic panel     Status: Abnormal   Collection Time: 06/15/18  9:38 AM  Result Value Ref Range   Sodium 134 (L) 135 - 145 mmol/L   Potassium 4.2 3.5 - 5.1 mmol/L   Chloride 102 98 - 111 mmol/L   CO2 20 (L) 22 - 32 mmol/L   Glucose, Bld 126 (H) 70 - 99 mg/dL   BUN 10 8 - 23 mg/dL   Creatinine, Ser 0.81 0.44 - 1.00 mg/dL   Calcium 9.6 8.9 - 10.3 mg/dL   GFR calc non Af Amer >60 >60 mL/min   GFR calc Af Amer >60 >60 mL/min    Comment: (NOTE) The eGFR has been calculated using the CKD EPI equation. This calculation has not been validated in all clinical situations. eGFR's persistently <60 mL/min signify possible Chronic Kidney Disease.    Anion gap 12 5 - 15    Comment: Performed at [facility] [address], [place], [place] [address]  Urinalysis, Routine w reflex microscopic     Status: Abnormal   Collection Time: 06/15/18  9:45 AM  Result Value Ref Range   Color, Urine YELLOW YELLOW   APPearance HAZY (A) CLEAR   Specific Gravity, Urine 1.011 1.005 - 1.030   pH 5.0 5.0 - 8.0   Glucose, UA NEGATIVE NEGATIVE mg/dL   Hgb urine dipstick MODERATE (A) NEGATIVE   Bilirubin Urine NEGATIVE NEGATIVE   Ketones, ur 20 (A) NEGATIVE mg/dL   Protein, ur NEGATIVE NEGATIVE mg/dL   Nitrite POSITIVE (A) NEGATIVE   Leukocytes, UA SMALL (A) NEGATIVE   RBC / HPF 0-5 0 - 5 RBC/hpf   WBC, UA 11-20 0 - 5 WBC/hpf   Bacteria, UA MANY (A) NONE SEEN   Squamous Epithelial / LPF 0-5 0 - 5   Mucus PRESENT     Comment: Performed at [facility], [place] [address] [place], [place] [address]    Imaging / Studies: Dg Chest Port 1 View  Result Date: 06/15/2018 CLINICAL DATA:   Left rib fx's EXAM: PORTABLE CHEST 1 VIEW COMPARISON:  Chest x-ray 06/13/2018, chest CT 06/12/2018 FINDINGS: Shallow lung inflation. Heart size is normal accounting for the AP portable position of the patient. Small LEFT pleural effusion is noted. There is subsegmental atelectasis at the LEFT lung base. No pneumothorax. LEFT rib fractures are better seen on recent CT exam. Surgical clips are identified in the LEFT axillary region. IMPRESSION: No pneumothorax. Increased LEFT LOWER lobe atelectasis and small effusion. Electronically Signed   By: Nolon Nations M.D.   On: 06/15/2018  10:10    Medications / Allergies: per chart  Antibiotics: Anti-infectives (From admission, onward)   Start     Dose/Rate Route Frequency Ordered Stop   06/15/18 1145  ciprofloxacin (CIPRO) tablet 500 mg     500 mg Oral 2 times daily 06/15/18 1137 06/18/18 0759        Note: Portions of this report may have been transcribed using voice recognition software. Every effort was made to ensure accuracy; however, inadvertent computerized transcription errors may be present.   Any transcriptional errors that result from this process are unintentional.     Adin Hector, MD, FACS, MASCRS Gastrointestinal and Minimally Invasive Surgery    [address], [place], [place] [address] [phone] Main / Paging [phone] Fax

## 2018-06-17 MED ORDER — GABAPENTIN 300 MG PO CAPS: 300.0000 mg | ORAL_CAPSULE | Freq: Three times a day (TID) | ORAL | 2 refills | Status: DC

## 2018-06-17 MED ORDER — VITAMIN C 500 MG PO TABS
500.0000 mg | ORAL_TABLET | Freq: Every day | ORAL | Status: DC
  Administered 2018-06-17 – 2018-06-18 (×2): 500 mg via ORAL
  Filled 2018-06-17 (×2): qty 1

## 2018-06-17 MED ORDER — OXYCODONE HCL 5 MG PO TABS: 5.0000 mg | ORAL_TABLET | Freq: Four times a day (QID) | ORAL | 0 refills | Status: DC | PRN

## 2018-06-17 MED ORDER — VITAMIN B-12 1000 MCG PO TABS
1000.0000 ug | ORAL_TABLET | Freq: Every day | ORAL | Status: DC
  Administered 2018-06-17 – 2018-06-18 (×2): 1000 ug via ORAL
  Filled 2018-06-17 (×2): qty 1

## 2018-06-17 MED ORDER — METHOCARBAMOL 500 MG PO TABS: 1000.0000 mg | ORAL_TABLET | Freq: Three times a day (TID) | ORAL | 2 refills | Status: DC
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
## 2018-06-17 NOTE — Discharge Instructions (Signed)
Remain in C-collar until seen by Dr. Ellene Route for your stable fracture of your cervical spine   Rib Fracture A rib fracture is a break or crack in one of the bones of the ribs. The ribs are like a cage that goes around your upper chest. A broken or cracked rib is often painful, but most do not cause other problems. Most rib fractures heal on their own in 1-3 months. Follow these instructions at home:  Avoid activities that cause pain to the injured area. Protect your injured area.  Slowly increase activity as told by your doctor.  Take medicine as told by your doctor.  Put ice on the injured area for the first 1-2 days after you have been treated or as told by your doctor. ? Put ice in a plastic bag. ? Place a towel between your skin and the bag. ? Leave the ice on for 15-20 minutes at a time, every 2 hours while you are awake.  Do deep breathing as told by your doctor. You may be told to: ? Take deep breaths many times a day. ? Cough many times a day while hugging a pillow. ? Use a device (incentive spirometer) to perform deep breathing many times a day.  Drink enough fluids to keep your pee (urine) clear or pale yellow.  Do not wear a rib belt or binder. These do not allow you to breathe deeply. Get help right away if:  You have a fever.  You have trouble breathing.  You cannot stop coughing.  You cough up thick or bloody spit (mucus).  You feel sick to your stomach (nauseous), throw up (vomit), or have belly (abdominal) pain.  Your pain gets worse and medicine does not help. This information is not intended to replace advice given to you by your health care provider. Make sure you discuss any questions you have with your health care provider. Document Released: 04/26/2008 Document Revised: 12/24/2015 Document Reviewed: 09/19/2012 Elsevier Interactive Patient Education  2018 [place].   Cervical Spine Fracture, Stable A cervical spine fracture is a break or crack in  one of the bones of the neck. If there is a very low risk of problems happening during healing, the fracture is considered stable. What are the causes? This condition may be caused by:  Motor vehicle accidents.  Injuries from sports such as diving, football, biking, wrestling, or skiing.  Severe osteoporosis or other bone diseases, such as cancers that spread to bone or metabolic abnormalities that cause bone weakness.  What are the signs or symptoms? Symptoms of this condition include:  Severe neck pain after an accident or fall. Pain may spread down the shoulders or arms.  Bruising or swelling on the back of the neck.  Numbness, tingling, sudden muscle tightening (spasms), or weakness in the arms, legs, or both.  How is this diagnosed? This condition may be diagnosed based on:  Your medical history.  A physical exam of your neck, arms, and legs.  Imaging studies of the neck, such as: ? X-rays. ? CT scan. ? MRI.  How is this treated? This condition is treated with a neck brace or cervical collar to keep your neck from moving during the healing process. A cervical collar is a device that supports your chin and the back of your head. You may also be given medicine to help relieve pain. Follow these instructions at home: If you have a neck brace:  Wear the brace as told by your health care  provider. Remove it only as told by your health care provider.  If you experience numbness or tingling, loosen the brace.  Keep the brace clean.  If the brace is not waterproof: ? Do not let it get wet. ? Cover it with a watertight covering when you take a bath or a shower. If you have a cervical collar:  Do not remove the collar unless your health care provider tells you to do this. If you are allowed to remove the collar for cleaning and bathing: ? Follow your health care providers instructions about how to safely take off the collar. ? Wash and thoroughly dry the skin on your  neck. Check your skin for irritation or sores. If you see any, tell your health care provider.  Ask your health care provider before making any adjustments to your collar. Small adjustments may be needed over time to improve comfort and reduce pressure on your chin or on the back of your head.  Keep long hair outside of the collar.  Keep your collar clean by wiping it with mild soap and water and letting it air-dry completely. The pads can be hand-washed with soap and water and air-dried completely. Managing pain, stiffness, and swelling  If directed, put ice on the injured area: ? If you have a removable brace or cervical collar, remove it as told by your health care provider. ? Put ice in a plastic bag. ? Place a towel between your skin and the bag. ? Leave the ice on for 20 minutes, 2-3 times a day. Activity  Do not drive a car until your health care provider approves.  Do not drive or use heavy machinery while taking prescription pain medicine.  Avoid physical activity for as long as directed. Ask your health care provider what activities are safe for you. General instructions  Take over-the-counter and prescription medicines only as told by your health care provider.  Do not take baths, swim, or use a hot tub until your health care provider approves. Ask your health care provider if you can take showers. You may only be allowed to take sponge baths for bathing.  Do not use any products that contain nicotine or tobacco, such as cigarettes and e-cigarettes. These can delay bone healing. If you need help quitting, ask your health care provider.  Keep all follow-up visits as told by your health care provider. This is important to help prevent long-term (chronic) or permanent injury, pain, and disability. You may need to have follow-up X-rays or MRI 1-3 weeks after your injury. Contact a health care provider if:  You have irritation or sores on your skin from your brace or cervical  collar. Get help right away if:  You have neck pain that gets worse.  You develop difficulties swallowing or breathing.  You develop swelling in your neck.  You have any of the following problems in your arms, legs, or both: ? Numbness. ? Weakness. ? Burning pain. ? Movement problems.  You are unable to control when you urinate or have a bowel movement (incontinence).  You have problems with coordination or difficulty walking. This information is not intended to replace advice given to you by your health care provider. Make sure you discuss any questions you have with your health care provider. Document Released: 06/04/2004 Document Revised: 04/21/2016 Document Reviewed: 04/21/2016 Elsevier Interactive Patient Education  2018 [place]: POST OP INSTRUCTIONS  ######################################################################  EAT Gradually transition to a high fiber diet with  a fiber supplement over the next few weeks after discharge.  Start with a pureed / full liquid diet (see below)  WALK Walk an hour a day.  Control your pain to do that.    CONTROL PAIN Control pain so that you can walk, sleep, tolerate sneezing/coughing, go up/down stairs.  HAVE A BOWEL MOVEMENT DAILY Keep your bowels regular to avoid problems.  OK to try a laxative to override constipation.  OK to use an antidairrheal to slow down diarrhea.  Call if not better after 2 tries  CALL IF YOU HAVE PROBLEMS/CONCERNS Call if you are still struggling despite following these instructions. Call if you have concerns not answered by these instructions  ######################################################################    1. DIET: Follow a light bland diet the first 24 hours after arrival home, such as soup, liquids, crackers, etc.  Be sure to include lots of fluids daily.  Avoid fast food or heavy meals as your are more likely to get nauseated.   2. Take your usually prescribed  home medications unless otherwise directed. 3. PAIN CONTROL: a. Pain is best controlled by a usual combination of three different methods TOGETHER: i. Ice/Heat ii. Over the counter pain medication iii. Prescription pain medication b. Most patients will experience some swelling and bruising around the incisions.  Ice packs or heating pads (30-60 minutes up to 6 times a day) will help. Use ice for the first few days to help decrease swelling and bruising, then switch to heat to help relax tight/sore spots and speed recovery.  Some people prefer to use ice alone, heat alone, alternating between ice & heat.  Experiment to what works for you.  Swelling and bruising can take several weeks to resolve.   c. It is helpful to take an over-the-counter pain medication regularly for the first few weeks.  Choose one of the following that works best for you: i. Naproxen (Aleve, etc)  Two 220mg  tabs twice a day ii. Ibuprofen (Advil, etc) Three 200mg  tabs four times a day (every meal & bedtime) iii. Acetaminophen (Tylenol, etc) 500-650mg  four times a day (every meal & bedtime) d. A  prescription for pain medication (such as oxycodone, hydrocodone, etc) should be given to you upon discharge.  Take your pain medication as prescribed.  i. If you are having problems/concerns with the prescription medicine (does not control pain, nausea, vomiting, rash, itching, etc), please call us [phone] to see if we need to switch you to a different pain medicine that will work better for you and/or control your side effect better. ii. If you need a refill on your pain medication, please contact your pharmacy.  They will contact our office to request authorization. Prescriptions will not be filled after 5 pm or on week-ends. 4. Avoid getting constipated.  Between the surgery and the pain medications, it is common to experience some constipation.  Increasing fluid intake and taking a fiber supplement (such as Metamucil, Citrucel,  FiberCon, MiraLax, etc) 1-2 times a day regularly will usually help prevent this problem from occurring.  A mild laxative (prune juice, Milk of Magnesia, MiraLax, etc) should be taken according to package directions if there are no bowel movements after 48 hours.   5. Wash / shower every day.  You may shower over the dressings as they are waterproof.  Continue to shower over incision(s) after the dressing is off.     6. ACTIVITIES as tolerated:   a. You may resume regular (light) daily activities beginning the next  day--such as daily self-care, walking, climbing stairs--gradually increasing activities as tolerated. Refrain from any heavy lifting or straining until you are off narcotics for pain control.   b. DO NOT PUSH THROUGH PAIN.  Let pain be your guide: If it hurts to do something, don't do it.  Pain is your body warning you to avoid that activity for another week until the pain goes down. c. You may drive when you are no longer taking prescription pain medication, you can comfortably wear a seatbelt, and you can safely maneuver your car and apply brakes. d. Dennis Bast may have sexual intercourse when it is comfortable.  7. FOLLOW UP in our office a. Please call [facility] at [phone] to set up an appointment to see your surgeon in the office for a follow-up appointment approximately 2-3 weeks after your surgery. b. Make sure that you call for this appointment the day you arrive home to insure a convenient appointment time. 9. IF YOU HAVE DISABILITY OR FAMILY LEAVE FORMS, BRING THEM TO THE OFFICE FOR PROCESSING.  DO NOT GIVE THEM TO YOUR DOCTOR.   WHEN TO CALL us [phone]: 1. Poor pain control 2. Reactions / problems with new medications (rash/itching, nausea, etc)  3. Fever over 101.5 F (38.5 C) 4. Worsening swelling or bruising 5. Continued bleeding from incision. 6. Increased pain, redness, or drainage from the incision 7. Difficulty breathing / swallowing   The clinic staff is  available to answer your questions during regular business hours (8:30am-5pm).  Please dont hesitate to call and ask to speak to one of our nurses for clinical concerns.   If you have a medical emergency, go to the nearest emergency room or call 911.  A surgeon from [facility] is always on call at the [facility], [place], [place], [place],   [address] ? MAIN: [phone] ? TOLL FREE: [phone] ?  FAX [phone] [email or web address]  Managing Pain  ######################################################################   CONTROL PAIN Control pain so that you can walk, sleep, tolerate sneezing/coughing, go up/down stairs.  (Good pain control is not pain free only when lying still, unable to move)  WALK Walk an hour a day.  Control your pain to do that.   HAVE A BOWEL MOVEMENT DAILY Keep your bowels regular to avoid problems.  OK to try a laxative to override constipation.  OK to use an antidairrheal to slow down diarrhea.  Call if not better after 2 tries  CALL IF YOU HAVE PROBLEMS/CONCERNS Call if you are still struggling despite following these instructions. Call if you have concerns not answered by these instructions  ######################################################################    Pain after surgery or related to activity is often due to strain/injury to muscle, tendon, nerves and/or incisions.  This pain is usually short-term and will improve in a few months.   Many people find it helpful to do the following things TOGETHER to help speed the process of healing and to get back to regular activity more quickly:  1. Avoid heavy physical activity at first a. No lifting greater than 20 pounds at first, then increase to lifting as tolerated over the next few weeks b. Do not push through the pain.  Listen to your body and avoid positions and maneuvers than reproduce the pain.  Wait a few  days before trying something more intense c. Walking is okay as tolerated, but go slowly and stop when getting sore.  If you can walk 30  minutes without stopping or pain, you can try more intense activity (running, jogging, aerobics, cycling, swimming, treadmill, sex, sports, weightlifting, etc ) d. Remember: If it hurts to do it, then dont do it!  2. Take Anti-inflammatory medication a. Choose ONE of the following over-the-counter medications: i.            Acetaminophen 500mg  tabs (Tylenol) 1-2 pills with every meal and just before bedtime (avoid if you have liver problems) ii.            Naproxen 220mg  tabs (ex. Aleve) 1-2 pills twice a day (avoid if you have kidney, stomach, IBD, or bleeding problems) iii. Ibuprofen 200mg  tabs (ex. Advil, Motrin) 3-4 pills with every meal and just before bedtime (avoid if you have kidney, stomach, IBD, or bleeding problems) b. Take with food/snack around the clock for 1-2 weeks i. This helps the muscle and nerve tissues become less irritable and calm down faster  3. Use a Heating pad or Ice/Cold Pack a. 4-6 times a day b. May use warm bath/hottub  or showers  4. Try Gentle Massage and/or Stretching  a. at the area of pain many times a day b. stop if you feel pain - do not overdo it  Try these steps together to help you body heal faster and avoid making things get worse.  Doing just one of these things may not be enough.    If you are not getting better after two weeks or are noticing you are getting worse, contact our office for further advice; we may need to re-evaluate you & see what other things we can do to help.

## 2018-06-17 NOTE — Discharge Summary (Addendum)
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 TONSILLECTOMY    . TOTAL HIP ARTHROPLASTY  (L) 2007 (R) 2011   [place]    Social History   Socioeconomic History  . Marital status: Single    Spouse name: Not on file  . Number of children: 1  . Years of education: Not on file  . Highest education level: Not on file  Occupational History  . Occupation: Retired -     Fish farm manager: RETIRED  Social Needs  . Financial resource strain: Not on file  . Food insecurity:    Worry: Not on file    Inability: Not on file  . Transportation needs:    Medical: Not on file    Non-medical: Not on file  Tobacco Use  . Smoking status: Former Smoker    Packs/day: 3.00    Years: 20.00    Pack years: 60.00    Last attempt to quit: 04/19/1985    Years since quitting: 33.1  . Smokeless tobacco: Never Used  Substance and Sexual Activity  . Alcohol use: Yes    Comment: occasionally   . Drug use: No  . Sexual activity: Not Currently    Comment:  1st intercourse 77  yo-Fewer than 5 partners  Lifestyle  . Physical activity:    Days per week: Not on file    Minutes per session: Not on file  . Stress: Not on file  Relationships  . Social connections:    Talks on phone: Not on file    Gets together: Not on file    Attends religious service: Not on file    Active member of club or organization: Not on file    Attends meetings of clubs or organizations: Not on file    Relationship status: Not on file  . Intimate partner violence:    Fear of current or ex partner: Not on file    Emotionally abused: Not on file    Physically abused: Not on file    Forced sexual activity: Not on file  Other Topics Concern  . Not on file  Social History Narrative   Regular exercise - NO             Family History  Problem Relation Age of Onset  . Arthritis Mother 79       polymyositis  . Polymyositis Mother   . Hypertension Father   . Heart disease Father   . Hypertension Brother   . Cancer Brother        Oral    Current Facility-Administered Medications  Medication Dose Route Frequency Provider Last Rate Last Dose  . acetaminophen (TYLENOL) tablet 1,000 mg  1,000 mg Oral Q6H Saverio Danker, PA-C   1,000 mg at 06/18/18 0359  . anastrozole (ARIMIDEX) tablet 1 mg  1 mg Oral Daily Georganna Skeans, MD   1 mg at 06/17/18 0920  . buPROPion (WELLBUTRIN XL) 24 hr tablet 150 mg  150 mg Oral Daily Georganna Skeans, MD   150 mg at 06/17/18 0920  . diazepam (VALIUM) tablet 5 mg  5 mg Oral Q6H PRN Georganna Skeans, MD   5 mg at 06/15/18 0532  . enoxaparin (LOVENOX) injection 40 mg  40 mg Subcutaneous Q24H Georganna Skeans, MD   40 mg at 06/17/18 0623  . gabapentin (NEURONTIN) capsule 300 mg  300 mg Oral BID Michael Boston, MD   300 mg at 06/17/18 2117  . ibuprofen (ADVIL,MOTRIN) tablet 400 mg  400 mg Oral TID Saverio Danker, PA-C   400 mg at 06/17/18 2117  . methocarbamol (ROBAXIN) tablet 1,000 mg  1,000 mg Oral TID Michael Boston, MD   1,000 mg at 06/17/18 2116  .  morphine 2 MG/ML injection 1-2 mg  1-2 mg Intravenous Q3H PRN Saverio Danker, PA-C      . ondansetron (ZOFRAN-ODT) disintegrating tablet 4 mg  4 mg Oral Q6H PRN Georganna Skeans, MD       Or  . ondansetron [facility]) injection 4 mg  4 mg Intravenous Q6H PRN Georganna Skeans, MD   4 mg at 06/12/18 2215  . oxyCODONE (Oxy IR/ROXICODONE) immediate release tablet 5-15 mg  5-15 mg Oral Q4H PRN Michael Boston, MD   15 mg at 06/18/18 0946  . polyethylene glycol (MIRALAX / GLYCOLAX) packet 17 g  17 g Oral BID Michael Boston, MD   17 g at 06/17/18 0920  . vitamin B-12 (CYANOCOBALAMIN) tablet 1,000 mcg  1,000 mcg Oral Daily Michael Boston, MD   1,000 mcg at 06/17/18 1327  . vitamin C (ASCORBIC ACID) tablet 500 mg  500 mg Oral Daily Michael Boston, MD   500 mg at 06/17/18 1327  . zolpidem (AMBIEN) tablet 5 mg  5  mg Oral QHS PRN Georganna Skeans, MD   5 mg at 06/17/18 2116     Allergies  Allergen Reactions  . Effexor Xr [Venlafaxine Hcl Er] Nausea Only    Vision problems, dizziness, light headedness.   . Augmentin [Amoxicillin-Pot Clavulanate] Diarrhea    diarrhea  . Cymbalta [Duloxetine Hcl] Diarrhea    diarrhea  . Ezetimibe-Simvastatin Other (See Comments)  . Phentermine Hcl Other (See Comments)    Signed:   Adin Hector, MD, FACS, MASCRS Gastrointestinal and Minimally Invasive Surgery    [address] [place], [place] [address] [phone] Main / Paging [phone] Fax  ADDENDUM: No changes to patient since yesterday when this DC summary was written.  She is stable for DC to skilled nursing facility today for further rehab.  Follow up has been arranged for this patient. Henreitta Cea 06/18/2018, 10:27 AM
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
## 2018-06-18 ENCOUNTER — Telehealth: Payer: Self-pay | Admitting: *Deleted

## 2018-06-18 MED ORDER — OXYCODONE HCL 5 MG PO TABS: 5.0000 mg | ORAL_TABLET | Freq: Four times a day (QID) | ORAL | 0 refills | Status: DC | PRN

## 2018-06-18 MED ORDER — METHOCARBAMOL 500 MG PO TABS: 1000.0000 mg | ORAL_TABLET | Freq: Three times a day (TID) | ORAL | 2 refills | Status: DC | PRN

## 2018-06-18 NOTE — Social Work (Signed)
CSW spoke with POA Manus Gunning [phone]). Discussed offers: [facility] offering a room at private pay, and [facility]. No further offers have been received from preferred facilities: [facility], [facility], and [facility]. Discussed challenges with placement due to [facility] liability and [facility] set up.   Manus Gunning has accepted a placement at [facility], which is a private room, since they cannot afford private pay at [facility] [facility]. Will update care team for d/c summary.  Westley Hummer, MSW, [place] Work [phone]

## 2018-06-18 NOTE — Social Work (Signed)
Clinical Social Worker facilitated patient discharge including contacting patient family and facility to confirm patient discharge plans.  Clinical information faxed to facility and family agreeable with plan.  CSW arranged ambulance transport via [facility] to [facility]. RN to call [phone] with report prior to discharge.  Clinical Social Worker will sign off for now as social work intervention is no longer needed. Please consult us again if new need arises.  Jakyri Brunkhorst H Desa Rech, LCSWA Clinical Social Worker [phone]  

## 2018-06-18 NOTE — Clinical Social Work Placement (Signed)
   CLINICAL SOCIAL WORK PLACEMENT  NOTE [facility]  Date:  06/18/2018  Patient Details  Name: [name] MRN: [id number] Date of Birth: Nov 19, 1940  Clinical Social Work is seeking post-discharge placement for this patient at the [place] level of care (*CSW will initial, date and re-position this form in  chart as items are completed):  Yes   Patient/family provided with [place] Work Department's list of facilities offering this level of care within the geographic area requested by the patient (or if unable, by the patient's family).  Yes   Patient/family informed of their freedom to choose among providers that offer the needed level of care, that participate in Medicare, Medicaid or managed care program needed by the patient, have an available bed and are willing to accept the patient.  Yes   Patient/family informed of [place]'s ownership interest in [facility] and [facility], as well as of the fact that they are under no obligation to receive care at these facilities.  PASRR submitted to EDS on       PASRR number received on       Existing PASRR number confirmed on 06/15/18     FL2 transmitted to all facilities in geographic area requested by pt/family on 06/15/18     FL2 transmitted to all facilities within larger geographic area on       Patient informed that his/her managed care company has contracts with or will negotiate with certain facilities, including the following:        Yes   Patient/family informed of bed offers received.  Patient chooses bed at [facility] recommends and patient chooses bed at      Patient to be transferred to [facility] on 06/18/18.  Patient to be transferred to facility by [facility]     Patient family notified on 06/18/18 of transfer.  Name of family member notified:  [name] [place]     PHYSICIAN       Additional Comment:     _______________________________________________ Alexander Mt, [place] 06/18/2018, 10:29 AM
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
## 2018-06-18 NOTE — Discharge Planning (Signed)
Patient discharged to SNF in stable condition.  

## 2018-06-19 ENCOUNTER — Encounter: Payer: Self-pay | Admitting: Internal Medicine

## 2018-06-19 ENCOUNTER — Non-Acute Institutional Stay (SKILLED_NURSING_FACILITY): Payer: Medicare Other | Admitting: Internal Medicine

## 2018-06-19 DIAGNOSIS — S129XXD Fracture of neck, unspecified, subsequent encounter: Secondary | ICD-10-CM | POA: Diagnosis not present

## 2018-06-19 DIAGNOSIS — J939 Pneumothorax, unspecified: Secondary | ICD-10-CM

## 2018-06-19 DIAGNOSIS — S2242XD Multiple fractures of ribs, left side, subsequent encounter for fracture with routine healing: Secondary | ICD-10-CM | POA: Diagnosis not present

## 2018-06-19 NOTE — Assessment & Plan Note (Signed)
As per Dr. Ellene Route, NS

## 2018-06-19 NOTE — Assessment & Plan Note (Signed)
Risk of medications on [place] List discussed with her

## 2018-06-19 NOTE — Assessment & Plan Note (Signed)
Resolved spontaneously

## 2018-06-19 NOTE — Patient Instructions (Signed)
See assessment and plan under each diagnosis in the problem list and acutely for this visit 
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
## 2018-06-20 ENCOUNTER — Encounter: Payer: Self-pay | Admitting: Internal Medicine

## 2018-06-20 ENCOUNTER — Telehealth: Payer: Self-pay | Admitting: Internal Medicine

## 2018-06-20 ENCOUNTER — Non-Acute Institutional Stay (SKILLED_NURSING_FACILITY): Payer: Medicare Other | Admitting: Adult Health

## 2018-06-20 ENCOUNTER — Encounter: Payer: Self-pay | Admitting: Adult Health

## 2018-06-20 DIAGNOSIS — G629 Polyneuropathy, unspecified: Secondary | ICD-10-CM | POA: Diagnosis not present

## 2018-06-20 DIAGNOSIS — S2242XD Multiple fractures of ribs, left side, subsequent encounter for fracture with routine healing: Secondary | ICD-10-CM

## 2018-06-20 DIAGNOSIS — E538 Deficiency of other specified B group vitamins: Secondary | ICD-10-CM | POA: Diagnosis not present

## 2018-06-20 DIAGNOSIS — F339 Major depressive disorder, recurrent, unspecified: Secondary | ICD-10-CM

## 2018-06-20 DIAGNOSIS — Z170 Estrogen receptor positive status [ER+]: Secondary | ICD-10-CM

## 2018-06-20 DIAGNOSIS — S129XXS Fracture of neck, unspecified, sequela: Secondary | ICD-10-CM

## 2018-06-20 DIAGNOSIS — F419 Anxiety disorder, unspecified: Secondary | ICD-10-CM

## 2018-06-20 DIAGNOSIS — S22009S Unspecified fracture of unspecified thoracic vertebra, sequela: Secondary | ICD-10-CM

## 2018-06-20 DIAGNOSIS — C50412 Malignant neoplasm of upper-outer quadrant of left female breast: Secondary | ICD-10-CM | POA: Diagnosis not present

## 2018-06-20 DIAGNOSIS — G4700 Insomnia, unspecified: Secondary | ICD-10-CM

## 2018-06-20 MED ORDER — GABAPENTIN 300 MG PO CAPS: 300.0000 mg | ORAL_CAPSULE | Freq: Two times a day (BID) | ORAL | 0 refills | Status: DC

## 2018-06-20 MED ORDER — BUPROPION HCL ER (XL) 150 MG PO TB24: 150.0000 mg | ORAL_TABLET | Freq: Every day | ORAL | 0 refills | Status: DC

## 2018-06-20 MED ORDER — DIAZEPAM 5 MG PO TABS: 5.0000 mg | ORAL_TABLET | Freq: Four times a day (QID) | ORAL | 0 refills | Status: DC | PRN

## 2018-06-20 MED ORDER — METHOCARBAMOL 500 MG PO TABS: 1000.0000 mg | ORAL_TABLET | Freq: Three times a day (TID) | ORAL | 0 refills | Status: DC | PRN

## 2018-06-20 MED ORDER — ONDANSETRON 4 MG PO TBDP: 4.0000 mg | ORAL_TABLET | Freq: Four times a day (QID) | ORAL | 0 refills | Status: DC | PRN

## 2018-06-20 MED ORDER — ANASTROZOLE 1 MG PO TABS: 1.0000 mg | ORAL_TABLET | Freq: Every day | ORAL | 0 refills | Status: DC

## 2018-06-20 MED ORDER — ZOLPIDEM TARTRATE 5 MG PO TABS: 5.0000 mg | ORAL_TABLET | Freq: Every evening | ORAL | 0 refills | Status: DC | PRN

## 2018-06-20 MED ORDER — OXYCODONE HCL 5 MG PO CAPS: 5.0000 mg | ORAL_CAPSULE | Freq: Four times a day (QID) | ORAL | 0 refills | Status: DC | PRN

## 2018-06-20 NOTE — Telephone Encounter (Signed)
I was aware. Get well! Thx

## 2018-06-20 NOTE — Telephone Encounter (Signed)
FYI

## 2018-06-20 NOTE — Progress Notes (Signed)
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 hemiwalker  Total discharge time: Greater than 30 minutes Greater than 50% was spent in counseling and coordination of care.  Discharge time involved coordination of the discharge process with social worker, nursing staff and therapy department. Medical justification for home health services/DME verified.   Durenda Age, NP [facility] [phone] (Monday-Friday 8:00 a.m. - 5:00 p.m.) [phone] (after hours)
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
## 2018-06-21 ENCOUNTER — Encounter: Payer: Medicare Other | Admitting: Gynecology

## 2018-06-22 ENCOUNTER — Telehealth: Payer: Self-pay | Admitting: *Deleted

## 2018-06-22 NOTE — Telephone Encounter (Signed)
Pt was on the patient ping report was discharge from  [facility] [place], [place] SNF 06/18/18 - 06/21/18 (3 days). Will contact pt to follow-up and make hosp f/u appt if need too.Marland KitchenJohny Chess  Diagnoses Unspecified displaced fracture of seventh cervical vertebra, subsequent encounter for fracture with routine healing ([id number]); Unspecified fracture of T9-T10 vertebra, subsequent encounter for fracture with routine healing (M15868Y); Multiple fractures of ribs, left side, subsequent encounter for fracture with routine healing ([id number])  Provider Unice Cobble

## 2018-06-22 NOTE — Telephone Encounter (Signed)
Transition Care Management Follow-up Telephone Call   Date discharged? 06/21/18   How have you been since you were released from the hospital? Pt states she is doing alright   Do you understand why you were in the hospital? YES   Do you understand the discharge instructions? YES   Where were you discharged to? Home   Items Reviewed:  Medications reviewed: YES  Allergies reviewed: YES  Dietary changes reviewed: NO  Referrals reviewed: NO   Functional Questionnaire:   Activities of Daily Living (ADLs):   She states she are independent in the following: bathing and hygiene, feeding, continence, grooming, toileting and dressing States they require assistance with the following: ambulation   Any transportation issues/concerns?: NO   Any patient concerns? NO   Confirmed importance and date/time of follow-up visits scheduled YES, 06/27/18  Provider Appointment booked with Dr. Alain Marion  Confirmed with patient if condition begins to worsen call PCP or go to the ER.  Patient was given the office number and encouraged to call back with question or concerns.  : YES

## 2018-06-25 ENCOUNTER — Telehealth: Payer: Self-pay | Admitting: Internal Medicine

## 2018-06-25 NOTE — Telephone Encounter (Signed)
FYI
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
## 2018-06-26 ENCOUNTER — Telehealth: Payer: Self-pay | Admitting: Internal Medicine

## 2018-06-26 NOTE — Telephone Encounter (Signed)
Verbals given, FYI 

## 2018-06-26 NOTE — Telephone Encounter (Signed)
Copied from [place] [phone]. Topic: Quick Communication - Home Health Verbal Orders >> Jun 26, 2018  9:41 AM Scherrie Gerlach wrote: Caller/Agency: [facility]  [facility] Callback Number: [phone] Please call with verbal for Skilled Nursing Frequency:  2 wk 2 1 wk 2

## 2018-06-26 NOTE — Telephone Encounter (Signed)
Copied from [place] [phone]. Topic: Quick Communication - Home Health Verbal Orders >> Jun 26, 2018  3:54 PM Rayann Heman wrote: Caller/Agency: [facility] Callback [phone] Requesting PT  Frequency: 2x3 1x1

## 2018-06-27 ENCOUNTER — Inpatient Hospital Stay: Payer: Medicare Other | Admitting: Internal Medicine

## 2018-07-02 ENCOUNTER — Telehealth: Payer: Self-pay | Admitting: Internal Medicine

## 2018-07-02 ENCOUNTER — Ambulatory Visit (INDEPENDENT_AMBULATORY_CARE_PROVIDER_SITE_OTHER): Payer: Medicare Other | Admitting: Internal Medicine

## 2018-07-02 ENCOUNTER — Encounter: Payer: Self-pay | Admitting: Internal Medicine

## 2018-07-02 DIAGNOSIS — G8929 Other chronic pain: Secondary | ICD-10-CM

## 2018-07-02 DIAGNOSIS — F411 Generalized anxiety disorder: Secondary | ICD-10-CM

## 2018-07-02 DIAGNOSIS — S22009S Unspecified fracture of unspecified thoracic vertebra, sequela: Secondary | ICD-10-CM

## 2018-07-02 DIAGNOSIS — S129XXS Fracture of neck, unspecified, sequela: Secondary | ICD-10-CM

## 2018-07-02 DIAGNOSIS — J939 Pneumothorax, unspecified: Secondary | ICD-10-CM

## 2018-07-02 DIAGNOSIS — M542 Cervicalgia: Secondary | ICD-10-CM | POA: Diagnosis not present

## 2018-07-02 DIAGNOSIS — M5440 Lumbago with sciatica, unspecified side: Secondary | ICD-10-CM | POA: Diagnosis not present

## 2018-07-02 DIAGNOSIS — S2242XD Multiple fractures of ribs, left side, subsequent encounter for fracture with routine healing: Secondary | ICD-10-CM

## 2018-07-02 DIAGNOSIS — E559 Vitamin D deficiency, unspecified: Secondary | ICD-10-CM

## 2018-07-02 DIAGNOSIS — M8580 Other specified disorders of bone density and structure, unspecified site: Secondary | ICD-10-CM

## 2018-07-02 DIAGNOSIS — D518 Other vitamin B12 deficiency anemias: Secondary | ICD-10-CM

## 2018-07-02 DIAGNOSIS — S2242XA Multiple fractures of ribs, left side, initial encounter for closed fracture: Secondary | ICD-10-CM

## 2018-07-02 MED ORDER — OXYCODONE HCL 5 MG PO CAPS: 5.0000 mg | ORAL_CAPSULE | Freq: Two times a day (BID) | ORAL | 0 refills | Status: DC | PRN

## 2018-07-02 MED ORDER — METHOCARBAMOL 500 MG PO TABS: 1000.0000 mg | ORAL_TABLET | Freq: Three times a day (TID) | ORAL | 0 refills | Status: DC | PRN

## 2018-07-02 MED ORDER — CYANOCOBALAMIN 1000 MCG/ML IJ SOLN
1000.0000 ug | Freq: Once | INTRAMUSCULAR | Status: AC
  Administered 2018-07-02: 1000 ug via INTRAMUSCULAR

## 2018-07-02 NOTE — Assessment & Plan Note (Signed)
Vit D 

## 2018-07-02 NOTE — Assessment & Plan Note (Signed)
Vit B12 

## 2018-07-02 NOTE — Assessment & Plan Note (Signed)
F/u w/Dr NS

## 2018-07-02 NOTE — Telephone Encounter (Signed)
Copied from [place] [phone]. Topic: Quick Communication - Home Health Verbal Orders >> Jul 02, 2018  9:29 AM Rutherford Nail, NT wrote: Caller/Agency: Ronalee Belts, occupational theraptist with [facility] Number: [phone] Requesting OT/PT/Skilled Nursing/Social Work: OT Frequency: 1x a week for 3 weeks. (including evaluation)

## 2018-07-02 NOTE — Telephone Encounter (Signed)
Ok Thx 

## 2018-07-02 NOTE — Patient Instructions (Signed)
Take Vitamin D

## 2018-07-02 NOTE — Assessment & Plan Note (Signed)
discussed
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
## 2018-07-02 NOTE — Assessment & Plan Note (Signed)
Oxycodone prn  Potential benefits of a long term opioids use as well as potential risks (i.e. addiction risk, apnea etc) and complications (i.e. Somnolence, constipation and others) were explained to the patient and were aknowledged. 
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
## 2018-07-02 NOTE — Assessment & Plan Note (Signed)
Resolved

## 2018-07-03 DIAGNOSIS — S22029D Unspecified fracture of second thoracic vertebra, subsequent encounter for fracture with routine healing: Secondary | ICD-10-CM | POA: Diagnosis not present

## 2018-07-03 DIAGNOSIS — E669 Obesity, unspecified: Secondary | ICD-10-CM

## 2018-07-03 DIAGNOSIS — G629 Polyneuropathy, unspecified: Secondary | ICD-10-CM

## 2018-07-03 DIAGNOSIS — C50412 Malignant neoplasm of upper-outer quadrant of left female breast: Secondary | ICD-10-CM

## 2018-07-03 DIAGNOSIS — K5790 Diverticulosis of intestine, part unspecified, without perforation or abscess without bleeding: Secondary | ICD-10-CM

## 2018-07-03 DIAGNOSIS — Z87440 Personal history of urinary (tract) infections: Secondary | ICD-10-CM

## 2018-07-03 DIAGNOSIS — I10 Essential (primary) hypertension: Secondary | ICD-10-CM

## 2018-07-03 DIAGNOSIS — Z87891 Personal history of nicotine dependence: Secondary | ICD-10-CM

## 2018-07-03 DIAGNOSIS — S22079D Unspecified fracture of T9-T10 vertebra, subsequent encounter for fracture with routine healing: Secondary | ICD-10-CM | POA: Diagnosis not present

## 2018-07-03 DIAGNOSIS — D649 Anemia, unspecified: Secondary | ICD-10-CM

## 2018-07-03 DIAGNOSIS — S12600D Unspecified displaced fracture of seventh cervical vertebra, subsequent encounter for fracture with routine healing: Secondary | ICD-10-CM | POA: Diagnosis not present

## 2018-07-03 DIAGNOSIS — S2242XD Multiple fractures of ribs, left side, subsequent encounter for fracture with routine healing: Secondary | ICD-10-CM

## 2018-07-03 NOTE — Telephone Encounter (Signed)
Notified Mike w/MD response.../lmb 

## 2018-07-09 ENCOUNTER — Telehealth: Payer: Self-pay | Admitting: Internal Medicine

## 2018-07-09 NOTE — Telephone Encounter (Signed)
Copied from [place] [id number]. Topic: Quick Communication - See Telephone Encounter >> Jul 09, 2018  3:53 PM Antonieta Iba C wrote: CRM for notification. See Telephone encounter for: 07/09/18.  Beth - PT w/ [facility] [phone]   Calling in because she is out at the pt's home. She would like to report that pt's BP is 183/100 -asymptomatic, no headache, no dizziness, no blurred vision.

## 2018-07-10 NOTE — Telephone Encounter (Signed)
FYI

## 2018-07-10 NOTE — Telephone Encounter (Signed)
Noted Pls cont to monitor BP Thx

## 2018-07-13 ENCOUNTER — Other Ambulatory Visit: Payer: Self-pay | Admitting: Adult Health

## 2018-07-13 ENCOUNTER — Ambulatory Visit: Payer: Self-pay

## 2018-07-13 DIAGNOSIS — F339 Major depressive disorder, recurrent, unspecified: Secondary | ICD-10-CM

## 2018-07-13 DIAGNOSIS — Z170 Estrogen receptor positive status [ER+]: Principal | ICD-10-CM

## 2018-07-13 DIAGNOSIS — C50412 Malignant neoplasm of upper-outer quadrant of left female breast: Secondary | ICD-10-CM

## 2018-07-13 NOTE — Telephone Encounter (Addendum)
[name]  fro  [facility] called to report that the patient BP before she ended her visit was 220/112. Call place to pt by NT.  Advised patient to seek emergency medical care. Pt refused advice but will keep appointment tomorrow. I reviewed S/S of stroke with patient. Pt verbalized understanding and states that she will call 911 if symptoms occur.
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
## 2018-07-14 ENCOUNTER — Ambulatory Visit: Payer: Medicare Other | Admitting: Family Medicine

## 2018-07-14 ENCOUNTER — Encounter: Payer: Self-pay | Admitting: Family Medicine

## 2018-07-14 ENCOUNTER — Ambulatory Visit (INDEPENDENT_AMBULATORY_CARE_PROVIDER_SITE_OTHER): Payer: Medicare Other | Admitting: Family Medicine

## 2018-07-14 VITALS — BP 178/96 | HR 114 | Temp 98.1°F | Resp 18 | Wt 180.0 lb

## 2018-07-14 DIAGNOSIS — S2242XD Multiple fractures of ribs, left side, subsequent encounter for fracture with routine healing: Secondary | ICD-10-CM

## 2018-07-14 DIAGNOSIS — I10 Essential (primary) hypertension: Secondary | ICD-10-CM | POA: Diagnosis not present

## 2018-07-14 MED ORDER — AMLODIPINE BESYLATE 5 MG PO TABS: 5.0000 mg | ORAL_TABLET | Freq: Every day | ORAL | 1 refills | Status: DC
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
## 2018-07-14 NOTE — Patient Instructions (Signed)

## 2018-07-18 ENCOUNTER — Other Ambulatory Visit: Payer: Self-pay | Admitting: Adult Health

## 2018-07-18 DIAGNOSIS — F339 Major depressive disorder, recurrent, unspecified: Secondary | ICD-10-CM

## 2018-07-20 ENCOUNTER — Other Ambulatory Visit: Payer: Self-pay | Admitting: Internal Medicine

## 2018-07-20 DIAGNOSIS — S22009S Unspecified fracture of unspecified thoracic vertebra, sequela: Secondary | ICD-10-CM

## 2018-07-20 DIAGNOSIS — S2242XD Multiple fractures of ribs, left side, subsequent encounter for fracture with routine healing: Secondary | ICD-10-CM

## 2018-07-20 DIAGNOSIS — S129XXS Fracture of neck, unspecified, sequela: Secondary | ICD-10-CM

## 2018-07-20 MED ORDER — METHOCARBAMOL 500 MG PO TABS: 1000.0000 mg | ORAL_TABLET | Freq: Three times a day (TID) | ORAL | 0 refills | Status: DC | PRN

## 2018-07-27 ENCOUNTER — Other Ambulatory Visit: Payer: Self-pay

## 2018-07-27 DIAGNOSIS — F419 Anxiety disorder, unspecified: Secondary | ICD-10-CM

## 2018-07-31 ENCOUNTER — Other Ambulatory Visit: Payer: Self-pay | Admitting: Internal Medicine

## 2018-07-31 DIAGNOSIS — F419 Anxiety disorder, unspecified: Secondary | ICD-10-CM

## 2018-07-31 MED ORDER — DIAZEPAM 5 MG PO TABS: 5.0000 mg | ORAL_TABLET | Freq: Two times a day (BID) | ORAL | 1 refills | Status: DC | PRN

## 2018-08-05 ENCOUNTER — Other Ambulatory Visit: Payer: Self-pay | Admitting: Family Medicine

## 2018-08-08 ENCOUNTER — Telehealth (HOSPITAL_COMMUNITY): Payer: Self-pay

## 2018-08-08 NOTE — Telephone Encounter (Signed)
Called pt ask if she wanted to go to [place] office and she said yes, called [facility] and faxed paperwork with PT referrral. NF

## 2018-08-14 ENCOUNTER — Ambulatory Visit (INDEPENDENT_AMBULATORY_CARE_PROVIDER_SITE_OTHER): Payer: Medicare Other | Admitting: Internal Medicine

## 2018-08-14 ENCOUNTER — Encounter: Payer: Self-pay | Admitting: Internal Medicine

## 2018-08-14 DIAGNOSIS — G4700 Insomnia, unspecified: Secondary | ICD-10-CM | POA: Diagnosis not present

## 2018-08-14 DIAGNOSIS — F411 Generalized anxiety disorder: Secondary | ICD-10-CM

## 2018-08-14 DIAGNOSIS — D518 Other vitamin B12 deficiency anemias: Secondary | ICD-10-CM

## 2018-08-14 DIAGNOSIS — E559 Vitamin D deficiency, unspecified: Secondary | ICD-10-CM

## 2018-08-14 DIAGNOSIS — S2242XA Multiple fractures of ribs, left side, initial encounter for closed fracture: Secondary | ICD-10-CM

## 2018-08-14 DIAGNOSIS — G5621 Lesion of ulnar nerve, right upper limb: Secondary | ICD-10-CM

## 2018-08-14 DIAGNOSIS — G5620 Lesion of ulnar nerve, unspecified upper limb: Secondary | ICD-10-CM | POA: Insufficient documentation

## 2018-08-14 MED ORDER — ZOLPIDEM TARTRATE 5 MG PO TABS: 5.0000 mg | ORAL_TABLET | Freq: Every evening | ORAL | 3 refills | Status: DC | PRN

## 2018-08-14 MED ORDER — HYDROCODONE-ACETAMINOPHEN 5-325 MG PO TABS: 1.0000 | ORAL_TABLET | Freq: Two times a day (BID) | ORAL | 0 refills | Status: DC | PRN

## 2018-08-14 MED ORDER — AMLODIPINE BESYLATE 5 MG PO TABS: 5.0000 mg | ORAL_TABLET | Freq: Every day | ORAL | 11 refills | Status: DC
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
## 2018-08-14 NOTE — Assessment & Plan Note (Signed)
D/c Oxy Re-start PRN Norco
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
## 2018-08-14 NOTE — Assessment & Plan Note (Signed)
On B12 

## 2018-08-14 NOTE — Assessment & Plan Note (Signed)
Vit D 

## 2018-08-16 ENCOUNTER — Encounter: Payer: Self-pay | Admitting: Physical Therapy

## 2018-08-16 ENCOUNTER — Other Ambulatory Visit: Payer: Self-pay

## 2018-08-16 ENCOUNTER — Ambulatory Visit: Payer: Medicare Other | Attending: Neurosurgery | Admitting: Physical Therapy

## 2018-08-16 DIAGNOSIS — R262 Difficulty in walking, not elsewhere classified: Secondary | ICD-10-CM

## 2018-08-16 DIAGNOSIS — R293 Abnormal posture: Secondary | ICD-10-CM | POA: Insufficient documentation

## 2018-08-16 DIAGNOSIS — M6281 Muscle weakness (generalized): Secondary | ICD-10-CM | POA: Insufficient documentation

## 2018-08-16 NOTE — Patient Instructions (Signed)
Access Code: [email or web address]  URL: [email or web address]  Date: 08/16/2018  Prepared by: Lovett Calender   Exercises  Seated Hamstring Stretch - 3 reps - 1 sets - 30 sec hold - 1x daily - 7x weekly  Seated Long Arc Quad - 10 reps - 3 sets - 1x daily - 7x weekly  Seated Toe Raise - 10 reps - 3 sets - 1x daily - 7x weekly  Seated Heel Raise - 10 reps - 3 sets - 1x daily - 7x weekly
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
## 2018-08-22 ENCOUNTER — Ambulatory Visit: Payer: Medicare Other | Admitting: Physical Therapy

## 2018-08-22 ENCOUNTER — Encounter: Payer: Self-pay | Admitting: Physical Therapy

## 2018-08-22 DIAGNOSIS — R262 Difficulty in walking, not elsewhere classified: Secondary | ICD-10-CM | POA: Diagnosis not present

## 2018-08-22 DIAGNOSIS — R293 Abnormal posture: Secondary | ICD-10-CM

## 2018-08-22 DIAGNOSIS — M6281 Muscle weakness (generalized): Secondary | ICD-10-CM
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
## 2018-08-24 ENCOUNTER — Encounter: Payer: Self-pay | Admitting: Physical Therapy

## 2018-08-24 ENCOUNTER — Ambulatory Visit: Payer: Medicare Other | Admitting: Physical Therapy

## 2018-08-24 DIAGNOSIS — R262 Difficulty in walking, not elsewhere classified: Secondary | ICD-10-CM | POA: Diagnosis not present

## 2018-08-24 DIAGNOSIS — M6281 Muscle weakness (generalized): Secondary | ICD-10-CM
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
## 2018-08-24 NOTE — Addendum Note (Signed)
Addended by: Lovett Calender D on: 08/24/2018 12:12 PM   Modules accepted: Orders

## 2018-08-29 ENCOUNTER — Ambulatory Visit: Payer: Medicare Other | Admitting: Physical Therapy

## 2018-08-29 ENCOUNTER — Encounter: Payer: Self-pay | Admitting: Physical Therapy

## 2018-08-29 DIAGNOSIS — R262 Difficulty in walking, not elsewhere classified: Secondary | ICD-10-CM

## 2018-08-29 DIAGNOSIS — R293 Abnormal posture: Secondary | ICD-10-CM

## 2018-08-29 DIAGNOSIS — M6281 Muscle weakness (generalized): Secondary | ICD-10-CM
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
## 2018-08-31 ENCOUNTER — Ambulatory Visit: Payer: Medicare Other | Admitting: Physical Therapy

## 2018-08-31 DIAGNOSIS — R293 Abnormal posture: Secondary | ICD-10-CM

## 2018-08-31 DIAGNOSIS — M6281 Muscle weakness (generalized): Secondary | ICD-10-CM

## 2018-08-31 DIAGNOSIS — R262 Difficulty in walking, not elsewhere classified: Secondary | ICD-10-CM | POA: Diagnosis not present
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
## 2018-09-05 ENCOUNTER — Ambulatory Visit: Payer: Medicare Other | Attending: Neurosurgery | Admitting: Physical Therapy

## 2018-09-05 ENCOUNTER — Encounter: Payer: Self-pay | Admitting: Physical Therapy

## 2018-09-05 DIAGNOSIS — R293 Abnormal posture: Secondary | ICD-10-CM | POA: Diagnosis present

## 2018-09-05 DIAGNOSIS — M6281 Muscle weakness (generalized): Secondary | ICD-10-CM | POA: Diagnosis present

## 2018-09-05 DIAGNOSIS — R262 Difficulty in walking, not elsewhere classified: Secondary | ICD-10-CM | POA: Diagnosis not present
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
## 2018-09-07 ENCOUNTER — Ambulatory Visit: Payer: Medicare Other | Admitting: Physical Therapy

## 2018-09-07 ENCOUNTER — Encounter: Payer: Self-pay | Admitting: Physical Therapy

## 2018-09-07 DIAGNOSIS — M6281 Muscle weakness (generalized): Secondary | ICD-10-CM

## 2018-09-07 DIAGNOSIS — R293 Abnormal posture: Secondary | ICD-10-CM

## 2018-09-07 DIAGNOSIS — R262 Difficulty in walking, not elsewhere classified: Secondary | ICD-10-CM
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
## 2018-09-07 NOTE — Patient Instructions (Signed)
Access Code: [email or web address]  URL: [email or web address]  Date: 09/07/2018  Prepared by: Venetia Night Beuhring   Exercises  Seated Hamstring Stretch - 3 reps - 1 sets - 30 sec hold - 1x daily - 7x weekly  Seated Toe Raise - 10 reps - 3 sets - 1x daily - 7x weekly  Seated Heel Raise - 10 reps - 3 sets - 1x daily - 7x weekly  Sitting Knee Extension with Resistance - 10 reps - 3 sets - 1x daily - 7x weekly  Seated Hamstring Curls with Resistance - 10 reps - 2 sets - 1x daily - 7x weekly  Seated March with Resistance - 10 reps - 2 sets - 1x daily - 7x weekly  Seated Hip Abduction with Resistance - 10 reps - 3 sets - 1x daily - 7x weekly  Sit to Stand with Hands on Knees - 10 reps - 3 sets - 1x daily - 7x weekly  Standing Hip Abduction with Counter Support - 10 reps - 3 sets - 1x daily - 7x weekly  Standing Hip Extension with Counter Support - 10 reps - 3 sets - 1x daily - 7x weekly  Heel rises with counter support - 20 reps - 1 sets - 1x daily - 7x weekly  Toe Raises with Counter Support - 20 reps - 1 sets - 1x daily - 7x weekly

## 2018-09-12 ENCOUNTER — Encounter: Payer: Self-pay | Admitting: Physical Therapy

## 2018-09-12 ENCOUNTER — Ambulatory Visit: Payer: Medicare Other | Admitting: Physical Therapy

## 2018-09-12 DIAGNOSIS — R262 Difficulty in walking, not elsewhere classified: Secondary | ICD-10-CM | POA: Diagnosis not present

## 2018-09-12 DIAGNOSIS — R293 Abnormal posture: Secondary | ICD-10-CM

## 2018-09-12 DIAGNOSIS — M6281 Muscle weakness (generalized): Secondary | ICD-10-CM
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
## 2018-09-14 ENCOUNTER — Ambulatory Visit: Payer: Medicare Other | Admitting: Physical Therapy

## 2018-09-14 DIAGNOSIS — R262 Difficulty in walking, not elsewhere classified: Secondary | ICD-10-CM | POA: Diagnosis not present

## 2018-09-14 DIAGNOSIS — M6281 Muscle weakness (generalized): Secondary | ICD-10-CM

## 2018-09-14 DIAGNOSIS — R293 Abnormal posture: Secondary | ICD-10-CM
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
## 2018-09-19 ENCOUNTER — Encounter: Payer: Self-pay | Admitting: Physical Therapy

## 2018-09-19 ENCOUNTER — Ambulatory Visit: Payer: Medicare Other | Admitting: Physical Therapy

## 2018-09-19 DIAGNOSIS — R293 Abnormal posture: Secondary | ICD-10-CM

## 2018-09-19 DIAGNOSIS — R262 Difficulty in walking, not elsewhere classified: Secondary | ICD-10-CM | POA: Diagnosis not present

## 2018-09-19 DIAGNOSIS — M6281 Muscle weakness (generalized): Secondary | ICD-10-CM
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
## 2018-09-21 ENCOUNTER — Ambulatory Visit: Payer: Medicare Other | Admitting: Physical Therapy

## 2018-09-26 ENCOUNTER — Encounter: Payer: Medicare Other | Admitting: Physical Therapy

## 2018-09-28 ENCOUNTER — Ambulatory Visit: Payer: Medicare Other | Admitting: Physical Therapy

## 2018-09-28 ENCOUNTER — Encounter: Payer: Self-pay | Admitting: Physical Therapy

## 2018-09-28 DIAGNOSIS — M6281 Muscle weakness (generalized): Secondary | ICD-10-CM

## 2018-09-28 DIAGNOSIS — R293 Abnormal posture: Secondary | ICD-10-CM

## 2018-09-28 DIAGNOSIS — R262 Difficulty in walking, not elsewhere classified: Secondary | ICD-10-CM
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
## 2018-10-01 ENCOUNTER — Other Ambulatory Visit: Payer: Self-pay | Admitting: Internal Medicine

## 2018-10-01 DIAGNOSIS — G4700 Insomnia, unspecified: Secondary | ICD-10-CM

## 2018-10-02 ENCOUNTER — Ambulatory Visit: Payer: Medicare Other | Attending: Neurosurgery | Admitting: Physical Therapy

## 2018-10-02 ENCOUNTER — Encounter: Payer: Self-pay | Admitting: Physical Therapy

## 2018-10-02 DIAGNOSIS — R293 Abnormal posture: Secondary | ICD-10-CM | POA: Insufficient documentation

## 2018-10-02 DIAGNOSIS — R262 Difficulty in walking, not elsewhere classified: Secondary | ICD-10-CM | POA: Diagnosis present

## 2018-10-02 DIAGNOSIS — M6281 Muscle weakness (generalized): Secondary | ICD-10-CM | POA: Insufficient documentation
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
## 2018-10-05 ENCOUNTER — Ambulatory Visit: Payer: Medicare Other | Admitting: Physical Therapy

## 2018-10-05 ENCOUNTER — Encounter: Payer: Self-pay | Admitting: Physical Therapy

## 2018-10-05 DIAGNOSIS — R293 Abnormal posture: Secondary | ICD-10-CM

## 2018-10-05 DIAGNOSIS — R262 Difficulty in walking, not elsewhere classified: Secondary | ICD-10-CM | POA: Diagnosis not present

## 2018-10-05 DIAGNOSIS — M6281 Muscle weakness (generalized): Secondary | ICD-10-CM
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
## 2018-10-09 ENCOUNTER — Encounter: Payer: Self-pay | Admitting: Physical Therapy

## 2018-10-09 ENCOUNTER — Ambulatory Visit: Payer: Medicare Other | Admitting: Physical Therapy

## 2018-10-09 DIAGNOSIS — M6281 Muscle weakness (generalized): Secondary | ICD-10-CM

## 2018-10-09 DIAGNOSIS — R262 Difficulty in walking, not elsewhere classified: Secondary | ICD-10-CM
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
## 2018-10-10 ENCOUNTER — Telehealth: Payer: Self-pay

## 2018-10-10 DIAGNOSIS — Z170 Estrogen receptor positive status [ER+]: Principal | ICD-10-CM

## 2018-10-10 DIAGNOSIS — C50412 Malignant neoplasm of upper-outer quadrant of left female breast: Secondary | ICD-10-CM

## 2018-10-10 MED ORDER — ANASTROZOLE 1 MG PO TABS: 1.0000 mg | ORAL_TABLET | Freq: Every day | ORAL | 0 refills | Status: DC

## 2018-10-10 NOTE — Telephone Encounter (Signed)
Pt called requesting refill of Anastrozole to local pharmacy until she receives mail order supply.   Nurse left voicemail informing Rx was sent to pharmacy.

## 2018-10-11 ENCOUNTER — Ambulatory Visit: Payer: Medicare Other | Admitting: Physical Therapy

## 2018-10-11 ENCOUNTER — Other Ambulatory Visit: Payer: Self-pay

## 2018-10-11 DIAGNOSIS — M6281 Muscle weakness (generalized): Secondary | ICD-10-CM

## 2018-10-11 DIAGNOSIS — R262 Difficulty in walking, not elsewhere classified: Secondary | ICD-10-CM
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
## 2018-10-12 ENCOUNTER — Other Ambulatory Visit: Payer: Self-pay | Admitting: *Deleted

## 2018-10-12 ENCOUNTER — Telehealth: Payer: Self-pay | Admitting: Oncology

## 2018-10-12 DIAGNOSIS — F339 Major depressive disorder, recurrent, unspecified: Secondary | ICD-10-CM

## 2018-10-12 MED ORDER — BUPROPION HCL ER (XL) 150 MG PO TB24: 150.0000 mg | ORAL_TABLET | Freq: Every day | ORAL | 1 refills | Status: DC

## 2018-10-12 NOTE — Telephone Encounter (Signed)
Scheduled appt per 3/13 sch message - left message and sent reminder letter in the mail.

## 2018-10-15 ENCOUNTER — Other Ambulatory Visit: Payer: Self-pay | Admitting: Oncology

## 2018-10-15 DIAGNOSIS — Z170 Estrogen receptor positive status [ER+]: Principal | ICD-10-CM

## 2018-10-15 DIAGNOSIS — C50412 Malignant neoplasm of upper-outer quadrant of left female breast: Secondary | ICD-10-CM

## 2018-10-16 ENCOUNTER — Other Ambulatory Visit: Payer: Self-pay | Admitting: *Deleted

## 2018-10-22 ENCOUNTER — Ambulatory Visit: Payer: Medicare Other | Admitting: Internal Medicine

## 2018-11-08 ENCOUNTER — Ambulatory Visit: Payer: Medicare Other | Admitting: Internal Medicine

## 2018-11-13 ENCOUNTER — Encounter: Payer: Medicare Other | Admitting: Gynecology

## 2018-12-03 ENCOUNTER — Telehealth: Payer: Self-pay | Admitting: Oncology

## 2018-12-03 NOTE — Telephone Encounter (Signed)
Called regarding upcoming [facility] appointment, due to personal reasons patient would like to cancel 05/07 appointment. Patient will call back when ready to reschedule.   Message to provider.

## 2018-12-06 ENCOUNTER — Inpatient Hospital Stay: Payer: Medicare Other | Admitting: Oncology

## 2018-12-06 ENCOUNTER — Other Ambulatory Visit: Payer: Medicare Other

## 2019-01-08 ENCOUNTER — Encounter: Payer: Self-pay | Admitting: Internal Medicine

## 2019-01-08 ENCOUNTER — Ambulatory Visit (INDEPENDENT_AMBULATORY_CARE_PROVIDER_SITE_OTHER): Payer: Medicare Other | Admitting: Internal Medicine

## 2019-01-08 ENCOUNTER — Other Ambulatory Visit: Payer: Self-pay

## 2019-01-08 ENCOUNTER — Other Ambulatory Visit (INDEPENDENT_AMBULATORY_CARE_PROVIDER_SITE_OTHER): Payer: Medicare Other

## 2019-01-08 ENCOUNTER — Other Ambulatory Visit: Payer: Self-pay | Admitting: Internal Medicine

## 2019-01-08 VITALS — BP 142/76 | HR 104 | Temp 98.2°F | Ht 64.0 in | Wt 175.0 lb

## 2019-01-08 DIAGNOSIS — E538 Deficiency of other specified B group vitamins: Secondary | ICD-10-CM | POA: Diagnosis not present

## 2019-01-08 DIAGNOSIS — J069 Acute upper respiratory infection, unspecified: Secondary | ICD-10-CM

## 2019-01-08 DIAGNOSIS — E559 Vitamin D deficiency, unspecified: Secondary | ICD-10-CM

## 2019-01-08 DIAGNOSIS — E785 Hyperlipidemia, unspecified: Secondary | ICD-10-CM

## 2019-01-08 DIAGNOSIS — F4323 Adjustment disorder with mixed anxiety and depressed mood: Secondary | ICD-10-CM

## 2019-01-08 DIAGNOSIS — R209 Unspecified disturbances of skin sensation: Secondary | ICD-10-CM

## 2019-01-08 DIAGNOSIS — G5621 Lesion of ulnar nerve, right upper limb: Secondary | ICD-10-CM

## 2019-01-08 LAB — HEPATIC FUNCTION PANEL
ALT: 14 U/L (ref 0–35)
AST: 15 U/L (ref 0–37)
Albumin: 4.4 g/dL (ref 3.5–5.2)
Alkaline Phosphatase: 99 U/L (ref 39–117)
Bilirubin, Direct: 0.1 mg/dL (ref 0.0–0.3)
Total Bilirubin: 0.5 mg/dL (ref 0.2–1.2)
Total Protein: 7.5 g/dL (ref 6.0–8.3)

## 2019-01-08 LAB — BASIC METABOLIC PANEL
BUN: 19 mg/dL (ref 6–23)
CO2: 26 mEq/L (ref 19–32)
Calcium: 10 mg/dL (ref 8.4–10.5)
Chloride: 101 mEq/L (ref 96–112)
Creatinine, Ser: 0.83 mg/dL (ref 0.40–1.20)
GFR: 66.28 mL/min (ref >60.00)
Glucose, Bld: 95 mg/dL (ref 70–99)
Potassium: 4.2 mEq/L (ref 3.5–5.1)
Sodium: 137 mEq/L (ref 135–145)

## 2019-01-08 LAB — VITAMIN B12: Vitamin B-12: 293 pg/mL (ref 211–911)

## 2019-01-08 LAB — VITAMIN D 25 HYDROXY (VIT D DEFICIENCY, FRACTURES): VITD: 31.13 ng/mL (ref 30.00–100.00)

## 2019-01-08 LAB — TSH: TSH: 2.33 u[IU]/mL (ref 0.35–4.50)

## 2019-01-08 MED ORDER — VITAMIN D3 50 MCG (2000 UT) PO CAPS: 2000.0000 [IU] | ORAL_CAPSULE | Freq: Every day | ORAL | 3 refills | Status: DC

## 2019-01-08 MED ORDER — B COMPLEX PO TABS: 1.0000 | ORAL_TABLET | Freq: Every day | ORAL | 3 refills | Status: AC

## 2019-01-08 MED ORDER — AZITHROMYCIN 250 MG PO TABS: ORAL_TABLET | ORAL | 0 refills | Status: DC

## 2019-01-08 NOTE — Assessment & Plan Note (Signed)
Resch surgery

## 2019-01-08 NOTE — Assessment & Plan Note (Signed)
Labs

## 2019-01-08 NOTE — Assessment & Plan Note (Signed)
On B12 

## 2019-01-08 NOTE — Addendum Note (Signed)
Addended by: Cassandria Anger on: 01/08/2019 11:35 AM   Modules accepted: Orders

## 2019-01-08 NOTE — Assessment & Plan Note (Signed)
B 12

## 2019-01-08 NOTE — Assessment & Plan Note (Signed)
Zpac CXR if not better

## 2019-01-08 NOTE — Assessment & Plan Note (Signed)
Wellbutrin
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
## 2019-01-08 NOTE — Assessment & Plan Note (Signed)
Vit D 

## 2019-01-22 ENCOUNTER — Other Ambulatory Visit: Payer: Self-pay | Admitting: *Deleted

## 2019-01-22 DIAGNOSIS — Z0000 Encounter for general adult medical examination without abnormal findings: Secondary | ICD-10-CM

## 2019-01-22 DIAGNOSIS — Z170 Estrogen receptor positive status [ER+]: Secondary | ICD-10-CM

## 2019-01-22 DIAGNOSIS — C50412 Malignant neoplasm of upper-outer quadrant of left female breast: Secondary | ICD-10-CM

## 2019-01-24 ENCOUNTER — Telehealth: Payer: Self-pay | Admitting: Oncology

## 2019-01-24 NOTE — Telephone Encounter (Signed)
Scheduled appt per 6/23 sch message- pt aware of appt date and time   

## 2019-02-12 ENCOUNTER — Other Ambulatory Visit: Payer: Self-pay

## 2019-02-12 ENCOUNTER — Ambulatory Visit
Admission: RE | Admit: 2019-02-12 | Discharge: 2019-02-12 | Disposition: A | Discharge status: Home / Self Care | Payer: Medicare Other | Source: Ambulatory Visit | Attending: Oncology | Admitting: Oncology

## 2019-02-12 DIAGNOSIS — C50412 Malignant neoplasm of upper-outer quadrant of left female breast: Secondary | ICD-10-CM

## 2019-02-12 DIAGNOSIS — Z170 Estrogen receptor positive status [ER+]: Secondary | ICD-10-CM

## 2019-02-12 DIAGNOSIS — Z0000 Encounter for general adult medical examination without abnormal findings: Secondary | ICD-10-CM

## 2019-02-26 ENCOUNTER — Inpatient Hospital Stay: Payer: Medicare Other

## 2019-02-26 ENCOUNTER — Inpatient Hospital Stay: Payer: Medicare Other | Attending: Oncology | Admitting: Oncology

## 2019-02-26 DIAGNOSIS — M8580 Other specified disorders of bone density and structure, unspecified site: Secondary | ICD-10-CM | POA: Diagnosis not present

## 2019-02-26 DIAGNOSIS — Z79811 Long term (current) use of aromatase inhibitors: Secondary | ICD-10-CM | POA: Insufficient documentation

## 2019-02-26 DIAGNOSIS — Z170 Estrogen receptor positive status [ER+]: Secondary | ICD-10-CM | POA: Insufficient documentation

## 2019-02-26 DIAGNOSIS — C50412 Malignant neoplasm of upper-outer quadrant of left female breast: Secondary | ICD-10-CM | POA: Diagnosis not present

## 2019-02-26 DIAGNOSIS — E785 Hyperlipidemia, unspecified: Secondary | ICD-10-CM | POA: Insufficient documentation

## 2019-02-26 DIAGNOSIS — Z87891 Personal history of nicotine dependence: Secondary | ICD-10-CM | POA: Insufficient documentation

## 2019-02-26 DIAGNOSIS — Z79899 Other long term (current) drug therapy: Secondary | ICD-10-CM | POA: Insufficient documentation

## 2019-02-26 DIAGNOSIS — Z90710 Acquired absence of both cervix and uterus: Secondary | ICD-10-CM | POA: Insufficient documentation

## 2019-02-26 DIAGNOSIS — M1990 Unspecified osteoarthritis, unspecified site: Secondary | ICD-10-CM | POA: Insufficient documentation

## 2019-02-26 MED ORDER — ANASTROZOLE 1 MG PO TABS: 1.0000 mg | ORAL_TABLET | Freq: Every day | ORAL | 4 refills | Status: DC
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
## 2019-04-01 ENCOUNTER — Other Ambulatory Visit: Payer: Self-pay | Admitting: Internal Medicine

## 2019-04-01 DIAGNOSIS — G4700 Insomnia, unspecified: Secondary | ICD-10-CM

## 2019-04-02 NOTE — Telephone Encounter (Signed)
Check [place] registry last filled 02/05/2019../lmb  

## 2019-04-23 ENCOUNTER — Ambulatory Visit: Payer: Self-pay

## 2019-04-23 NOTE — Telephone Encounter (Signed)
FYI

## 2019-04-23 NOTE — Telephone Encounter (Signed)
Incoming call from Patient with complaint of feeling dizzy / Onset was last night.  Woke up this morning experienced the same Sx.   Patient questions if mit could be her other medications  Causing the light headedness. Heartm rate was 100 during call. Attempted to offer an appointment. Pt. Pleaded to wait and watch to see what happens one mor day  . Promise to call back if Sx.  Worsen.  Encouraged Pt.  To call back if Sx worsen.     rate dizziness as moderate    Reason for Disposition . [1] MODERATE dizziness (e.g., interferes with normal activities) AND [2] has been evaluated by physician for this  Answer Assessment - Initial Assessment Questions 1. DESCRIPTION: "Describe your dizziness."     *No Answer* 2. LIGHTHEADED: "Do you feel lightheaded?" (e.g., somewhat faint, woozy, weak upon standing)     lghtheaded 3. VERTIGO: "Do you feel like either you or the room is spinning or tilting?" (i.e. vertigo)     *No Answer* 4. SEVERITY: "How bad is it?"  "Do you feel like you are going to faint?" "Can you stand and walk?"   - MILD - walking normally   - MODERATE - interferes with normal activities (e.g., work, school)    - SEVERE - unable to stand, requires support to walk, feels like passing out now.      moderate 5. ONSET:  "When did the dizziness begin?"     Last night  6. AGGRAVATING FACTORS: "Does anything make it worse?" (e.g., standing, change in head position)     denies 7. HEART RATE: "Can you tell me your heart rate?" "How many beats in 15 seconds?"  (Note: not all patients can do this)      100 8. CAUSE: "What do you think is causing the dizziness?"     no 9. RECURRENT SYMPTOM: "Have you had dizziness before?" If so, ask: "When was the last time?" "What happened that time?"     denies 10. OTHER SYMPTOMS: "Do you have any other symptoms?" (e.g., fever, chest pain, vomiting, diarrhea, bleeding)     Denies  11. PREGNANCY: "Is there any chance you are pregnant?" "When was your last  menstrual period?"      na  Protocols used: DIZZINESS [facility]

## 2019-05-04 ENCOUNTER — Other Ambulatory Visit: Payer: Self-pay | Admitting: Oncology

## 2019-05-04 DIAGNOSIS — F339 Major depressive disorder, recurrent, unspecified: Secondary | ICD-10-CM

## 2019-05-06 ENCOUNTER — Telehealth: Payer: Self-pay | Admitting: Oncology

## 2019-05-06 NOTE — Telephone Encounter (Signed)
Scheduled appt per 10/5 sch message - mailed reminder letter with appt date and time

## 2019-05-09 ENCOUNTER — Encounter: Payer: Self-pay | Admitting: Gynecology

## 2019-05-13 ENCOUNTER — Ambulatory Visit (INDEPENDENT_AMBULATORY_CARE_PROVIDER_SITE_OTHER): Payer: Medicare Other | Admitting: Internal Medicine

## 2019-05-13 ENCOUNTER — Encounter: Payer: Self-pay | Admitting: Internal Medicine

## 2019-05-13 ENCOUNTER — Other Ambulatory Visit: Payer: Self-pay

## 2019-05-13 VITALS — BP 160/100 | HR 100 | Temp 98.2°F | Ht 64.0 in | Wt 183.0 lb

## 2019-05-13 DIAGNOSIS — F411 Generalized anxiety disorder: Secondary | ICD-10-CM | POA: Diagnosis not present

## 2019-05-13 DIAGNOSIS — E538 Deficiency of other specified B group vitamins: Secondary | ICD-10-CM

## 2019-05-13 DIAGNOSIS — E559 Vitamin D deficiency, unspecified: Secondary | ICD-10-CM

## 2019-05-13 DIAGNOSIS — I10 Essential (primary) hypertension: Secondary | ICD-10-CM

## 2019-05-13 DIAGNOSIS — R413 Other amnesia: Secondary | ICD-10-CM

## 2019-05-13 DIAGNOSIS — Z23 Encounter for immunization: Secondary | ICD-10-CM | POA: Diagnosis not present

## 2019-05-13 MED ORDER — AMLODIPINE-OLMESARTAN 5-20 MG PO TABS: 1.0000 | ORAL_TABLET | Freq: Every day | ORAL | 3 refills | Status: DC

## 2019-05-13 NOTE — Assessment & Plan Note (Signed)
Pt declined anti-depressants wellbutrin

## 2019-05-13 NOTE — Assessment & Plan Note (Signed)
On B12 

## 2019-05-13 NOTE — Assessment & Plan Note (Signed)
Norvasc

## 2019-05-13 NOTE — Assessment & Plan Note (Signed)
Vit B complex

## 2019-05-13 NOTE — Assessment & Plan Note (Signed)
Vit D 

## 2019-05-13 NOTE — Progress Notes (Signed)
Subjective:  Patient ID: [name], female    DOB: 02-17-1941  Age: 78 y.o. MRN: [id number]  CC: No chief complaint on file.   HPI [name] presents for HTN, LBP, memory loss; not sleeping well C/o lightheadedness at times  x1 week  BP is high at home: SBP 180  Outpatient Medications Prior to Visit  Medication Sig Dispense Refill  . acetaminophen (TYLENOL) 500 MG tablet Take 1,000 mg by mouth every 6 (six) hours.    Marland Kitchen amLODipine (NORVASC) 5 MG tablet Take 1 tablet (5 mg total) by mouth daily. 30 tablet 11  . anastrozole (ARIMIDEX) 1 MG tablet Take 1 tablet (1 mg total) by mouth daily. 90 tablet 4  . Ascorbic Acid (VITAMIN C PO) Take by mouth daily.    Marland Kitchen b complex vitamins tablet Take 1 tablet by mouth daily. 100 tablet 3  . buPROPion (WELLBUTRIN XL) 150 MG 24 hr tablet TAKE 1 TABLET BY MOUTH  DAILY 90 tablet 3  . Cholecalciferol (VITAMIN D3) 50 MCG (2000 UT) capsule Take 1 capsule (2,000 Units total) by mouth daily. 100 capsule 3  . diazepam (VALIUM) 5 MG tablet Take 1 tablet (5 mg total) by mouth every 12 (twelve) hours as needed for anxiety or muscle spasms. 30 tablet 1  . fexofenadine (ALLEGRA ALLERGY) 180 MG tablet Take 180 mg by mouth daily.    Marland Kitchen ibuprofen (ADVIL,MOTRIN) 400 MG tablet Take 400 mg by mouth 3 (three) times daily.    . polyethylene glycol (MIRALAX / GLYCOLAX) packet Take 17 g by mouth 2 (two) times daily.    . vitamin B-12 (CYANOCOBALAMIN) 1000 MCG tablet Take 1,000 mcg by mouth daily.      Marland Kitchen zolpidem (AMBIEN) 5 MG tablet TAKE 1 TABLET (5 MG TOTAL) BY MOUTH AT BEDTIME AS NEEDED FOR SLEEP. 30 tablet 3   No facility-administered medications prior to visit.     ROS: Review of Systems  Constitutional: Negative for activity change, appetite change, chills, fatigue and unexpected weight change.  HENT: Negative for congestion, mouth sores and sinus pressure.   Eyes: Negative for visual disturbance.  Respiratory: Negative for cough and chest tightness.    Gastrointestinal: Negative for abdominal pain and nausea.  Genitourinary: Negative for difficulty urinating, frequency and vaginal pain.  Musculoskeletal: Positive for back pain. Negative for gait problem.  Skin: Negative for pallor and rash.  Neurological: Negative for dizziness, tremors, weakness, numbness and headaches.  Psychiatric/Behavioral: Negative for confusion, sleep disturbance and suicidal ideas. The patient is nervous/anxious.     Objective:  BP (!) 142/84 (BP Location: Right Arm, Patient Position: Sitting, Cuff Size: Normal)   Pulse 100   Temp 98.2 F (36.8 C) (Oral)   Ht 5\' 4"  (1.626 m)   Wt 183 lb (83 kg)   SpO2 98%   BMI 31.41 kg/m   BP Readings from Last 3 Encounters:  05/13/19 (!) 142/84  01/08/19 (!) 142/76  08/14/18 (!) 152/86    Wt Readings from Last 3 Encounters:  05/13/19 183 lb (83 kg)  01/08/19 175 lb (79.4 kg)  08/14/18 177 lb (80.3 kg)    Physical Exam Constitutional:      General: She is not in acute distress.    Appearance: She is well-developed.  HENT:     Head: Normocephalic.     Right Ear: External ear normal.     Left Ear: External ear normal.     Nose: Nose normal.  Eyes:     General:  Right eye: No discharge.        Left eye: No discharge.     Conjunctiva/sclera: Conjunctivae normal.     Pupils: Pupils are equal, round, and reactive to light.  Neck:     Musculoskeletal: Normal range of motion and neck supple.     Thyroid: No thyromegaly.     Vascular: No JVD.     Trachea: No tracheal deviation.  Cardiovascular:     Rate and Rhythm: Normal rate and regular rhythm.     Heart sounds: Normal heart sounds.  Pulmonary:     Effort: No respiratory distress.     Breath sounds: No stridor. No wheezing.  Abdominal:     General: Bowel sounds are normal. There is no distension.     Palpations: Abdomen is soft. There is no mass.     Tenderness: There is no abdominal tenderness. There is no guarding or rebound.   Musculoskeletal:        General: No tenderness.  Lymphadenopathy:     Cervical: No cervical adenopathy.  Skin:    Findings: No erythema or rash.  Neurological:     Mental Status: She is oriented to person, place, and time.     Cranial Nerves: No cranial nerve deficit.     Motor: No abnormal muscle tone.     Coordination: Coordination normal.     Deep Tendon Reflexes: Reflexes normal.  Psychiatric:        Behavior: Behavior normal.        Thought Content: Thought content normal.        Judgment: Judgment normal.    No orthostatic sx's  Lab Results  Component Value Date   WBC 11.8 (H) 06/15/2018   HGB 14.1 06/15/2018   HCT 42.1 06/15/2018   PLT 324 06/15/2018   GLUCOSE 95 01/08/2019   CHOL 221 (H) 10/20/2014   TRIG 140.0 10/20/2014   HDL 54.00 10/20/2014   LDLDIRECT 161.3 06/17/2011   LDLCALC 139 (H) 10/20/2014   ALT 14 01/08/2019   AST 15 01/08/2019   NA 137 01/08/2019   K 4.2 01/08/2019   CL 101 01/08/2019   CREATININE 0.83 01/08/2019   BUN 19 01/08/2019   CO2 26 01/08/2019   TSH 2.33 01/08/2019   INR 1.06 06/12/2018    Mm Diag Breast Tomo Bilateral  Result Date: 02/12/2019 CLINICAL DATA:  78 year old female for annual bilateral mammogram. History of LEFT breast cancer and lumpectomy in 2019. EXAM: DIGITAL DIAGNOSTIC BILATERAL MAMMOGRAM WITH CAD AND TOMO COMPARISON:  Previous exam(s). ACR Breast Density Category a: The breast tissue is almost entirely fatty. FINDINGS: 2D and 3D full field views of both breasts and a magnification view of the lumpectomy site demonstrate no suspicious mass, nonsurgical distortion or worrisome calcifications. LEFT lumpectomy changes with dystrophic calcifications noted. Mammographic images were processed with CAD. IMPRESSION: No mammographic evidence of malignancy. RECOMMENDATION: Bilateral diagnostic mammogram in 1 year I have discussed the findings and recommendations with the patient. If applicable, a reminder letter will be sent to the  patient regarding the next appointment. BI-RADS CATEGORY  2: Benign. Electronically Signed   By: Margarette Canada M.D.   On: 02/12/2019 16:15    Assessment & Plan:   There are no diagnoses linked to this encounter.   No orders of the defined types were placed in this encounter.    Follow-up: No follow-ups on file.  [name], MD

## 2019-05-14 NOTE — Addendum Note (Signed)
Addended by: Karren Cobble on: 05/14/2019 11:19 AM   Modules accepted: Orders

## 2019-05-16 ENCOUNTER — Ambulatory Visit
Admission: RE | Admit: 2019-05-16 | Discharge: 2019-05-16 | Disposition: A | Discharge status: Home / Self Care | Payer: Medicare Other | Source: Ambulatory Visit | Attending: Oncology | Admitting: Oncology

## 2019-05-16 ENCOUNTER — Other Ambulatory Visit: Payer: Self-pay

## 2019-05-16 DIAGNOSIS — C50412 Malignant neoplasm of upper-outer quadrant of left female breast: Secondary | ICD-10-CM

## 2019-05-16 DIAGNOSIS — M8580 Other specified disorders of bone density and structure, unspecified site: Secondary | ICD-10-CM

## 2019-05-16 DIAGNOSIS — Z170 Estrogen receptor positive status [ER+]: Secondary | ICD-10-CM

## 2019-05-20 ENCOUNTER — Telehealth: Payer: Self-pay | Admitting: Internal Medicine

## 2019-05-20 MED ORDER — AMLODIPINE-OLMESARTAN 5-20 MG PO TABS: 1.0000 | ORAL_TABLET | Freq: Every day | ORAL | 3 refills | Status: DC

## 2019-05-20 NOTE — Telephone Encounter (Signed)
RX sent

## 2019-05-20 NOTE — Telephone Encounter (Signed)
amLODipine-olmesartan (AZOR) 5-20 MG tablet     Patient is requesting this medication be sent to pharmacy below as it was too expensive through [facility].     [facility] [address], [place] [phone] (Phone) [phone] (Fax)

## 2019-05-21 ENCOUNTER — Telehealth: Payer: Self-pay

## 2019-05-21 NOTE — Telephone Encounter (Signed)
Attempted to call patient with BD results but there was no answer.

## 2019-05-21 NOTE — Telephone Encounter (Signed)
-----   Message from Gardenia Phlegm, NP sent at 05/21/2019  1:57 PM EDT ----- Bone density normal, please notify patient ----- Message ----- From: Interface, Rad Results In Sent: 05/16/2019   1:47 PM EDT To: Chauncey Cruel, MD

## 2019-05-25 ENCOUNTER — Other Ambulatory Visit: Payer: Self-pay | Admitting: Oncology

## 2019-05-25 DIAGNOSIS — Z170 Estrogen receptor positive status [ER+]: Secondary | ICD-10-CM

## 2019-05-25 DIAGNOSIS — C50412 Malignant neoplasm of upper-outer quadrant of left female breast: Secondary | ICD-10-CM

## 2019-06-17 ENCOUNTER — Other Ambulatory Visit: Payer: Self-pay

## 2019-06-18 ENCOUNTER — Ambulatory Visit (INDEPENDENT_AMBULATORY_CARE_PROVIDER_SITE_OTHER): Payer: Medicare Other | Admitting: Gynecology

## 2019-06-18 ENCOUNTER — Other Ambulatory Visit: Payer: Self-pay | Admitting: Internal Medicine

## 2019-06-18 ENCOUNTER — Encounter: Payer: Self-pay | Admitting: Gynecology

## 2019-06-18 VITALS — BP 120/74 | Ht 63.0 in | Wt 187.0 lb

## 2019-06-18 DIAGNOSIS — N952 Postmenopausal atrophic vaginitis: Secondary | ICD-10-CM

## 2019-06-18 DIAGNOSIS — Z853 Personal history of malignant neoplasm of breast: Secondary | ICD-10-CM | POA: Diagnosis not present

## 2019-06-18 DIAGNOSIS — Z01419 Encounter for gynecological examination (general) (routine) without abnormal findings: Secondary | ICD-10-CM | POA: Diagnosis not present

## 2019-06-18 DIAGNOSIS — F419 Anxiety disorder, unspecified: Secondary | ICD-10-CM

## 2019-06-18 DIAGNOSIS — Z9289 Personal history of other medical treatment: Secondary | ICD-10-CM

## 2019-06-18 DIAGNOSIS — M8580 Other specified disorders of bone density and structure, unspecified site: Secondary | ICD-10-CM

## 2019-06-18 NOTE — Patient Instructions (Signed)
Follow-up in 1 year for annual exam, sooner as needed. 

## 2019-06-18 NOTE — Progress Notes (Signed)
    [name] [birth year] [id number]        78 y.o.  G2P0011 for breast and pelvic exam.  Without gynecologic complaints.  Past medical history,surgical history, problem list, medications, allergies, family history and social history were all reviewed and documented as reviewed in the EPIC chart.  ROS:  Performed with pertinent positives and negatives included in the history, assessment and plan.   Additional significant findings : None   Exam: [name] assistant Vitals:   06/18/19 1210  BP: 120/74  Weight: 187 lb (84.8 kg)  Height: 5\' 3"  (1.6 m)   Body mass index is 33.13 kg/m.  General appearance:  Normal affect, orientation and appearance. Skin: Grossly normal HEENT: Without gross lesions.  No cervical or supraclavicular adenopathy. Thyroid normal.  Lungs:  Clear without wheezing, rales or rhonchi Cardiac: RR, without RMG Abdominal:  Soft, nontender, without masses, guarding, rebound, organomegaly or hernia Breasts:  Examined lying and sitting without masses, retractions, discharge or axillary adenopathy.  Well-healed left lumpectomy scar.  Bilateral reduction scars. Pelvic:  Ext, BUS, Vagina: With atrophic changes  Cervix: With atrophic changes  Adnexa: Without masses or tenderness    Anus and perineum: Normal   Rectovaginal: Normal sphincter tone without palpated masses or tenderness.    Assessment/Plan:  78 y.o. [name] female for breast and pelvic exam.  Status post supracervical hysterectomy in the past.  1. Postmenopausal.  No significant menopausal symptoms. 2. 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  Is doing well without significant symptoms.  Exam overall shows atrophic changes but no significant lichen sclerosus type changes. 3. Osteopenia.  Recent DEXA through Dr. Virgie Dad office normal.  She will continue to follow-up with him in reference to bone health. 4. Pap smear 2017.  No Pap smear done today.  No history of abnormal Pap  smears.  We will plan on stop screening per current screening guidelines. 5. Colonoscopy 2019.  Repeat at their recommended interval. 6. History of left breast cancer.  Actively followed by oncology.  Mammography 01/2019.  Exam NED. 7. Health maintenance.  No routine lab work done as patient does this elsewhere.  Follow-up 1 year, sooner as needed.   Christina Auerbach MD, 1:02 PM 06/18/2019

## 2019-06-19 ENCOUNTER — Telehealth: Payer: Self-pay | Admitting: Internal Medicine

## 2019-06-19 DIAGNOSIS — F419 Anxiety disorder, unspecified: Secondary | ICD-10-CM

## 2019-06-19 NOTE — Telephone Encounter (Signed)
Request received yesterday and sent to PCP

## 2019-06-19 NOTE — Telephone Encounter (Signed)
Copied from [place] [id number]. Topic: Quick Communication - Rx Refill/Question >> Jun 19, 2019  1:30 PM [name], [place] A wrote: Medication: diazepam (VALIUM) 5 MG tablet (Pt stated pharmacy has tried reaching out multiple times but havent gotten medication sent over yet.)  Has the patient contacted their pharmacy? {Yes (Agent: If no, request that the patient contact the pharmacy for the refill.) (Agent: If yes, when and what did the pharmacy advise?)Contact PCP  Preferred Pharmacy (with phone number [facility] street name[id number] [place], [place] [address]  Agent: Please be advised that RX refills may take up to 3 business days. We ask that you follow-up with your pharmacy.

## 2019-06-19 NOTE — Telephone Encounter (Signed)
[place] Controlled Database Checked Last filled: 01/22/19 # 30 LOV w/you: 05/13/19 Next appt w/you: 08/13/19

## 2019-06-19 NOTE — Telephone Encounter (Signed)
Routing to CMA 

## 2019-06-19 NOTE — Telephone Encounter (Signed)
Requested medication (s) are due for refill today: yes  Requested medication (s) are on the active medication list: yes  Last refill:  07/31/2018  Future visit scheduled:yes  Notes to clinic: refill cannot be delegated    Requested Prescriptions  Pending Prescriptions Disp Refills   diazepam (VALIUM) 5 MG tablet 30 tablet 1    Sig: Take 1 tablet (5 mg total) by mouth every 12 (twelve) hours as needed for anxiety or muscle spasms.     Not Delegated - Psychiatry:  Anxiolytics/Hypnotics Failed - 06/19/2019  1:34 PM      Failed - This refill cannot be delegated      Failed - Urine Drug Screen completed in last 360 days.      Passed - Valid encounter within last 6 months    Recent Outpatient Visits          1 month ago Need for influenza vaccination   [place], MD   5 months ago Dyslipidemia   [place], MD   10 months ago Insomnia, unspecified type   [place], MD   11 months ago Hypertension, unspecified type   [place] Burchette, Alinda Sierras, MD   11 months ago Pneumothorax on left   [place], MD      Future Appointments            In 1 month Plotnikov, Evie Lacks, MD [place], [place]

## 2019-07-20 ENCOUNTER — Other Ambulatory Visit: Payer: Self-pay | Admitting: Internal Medicine

## 2019-08-13 ENCOUNTER — Other Ambulatory Visit: Payer: Self-pay

## 2019-08-13 ENCOUNTER — Ambulatory Visit (INDEPENDENT_AMBULATORY_CARE_PROVIDER_SITE_OTHER): Payer: Medicare Other | Admitting: Internal Medicine

## 2019-08-13 ENCOUNTER — Encounter: Payer: Self-pay | Admitting: Internal Medicine

## 2019-08-13 DIAGNOSIS — E559 Vitamin D deficiency, unspecified: Secondary | ICD-10-CM | POA: Diagnosis not present

## 2019-08-13 DIAGNOSIS — R413 Other amnesia: Secondary | ICD-10-CM

## 2019-08-13 DIAGNOSIS — E6609 Other obesity due to excess calories: Secondary | ICD-10-CM | POA: Diagnosis not present

## 2019-08-13 DIAGNOSIS — Z6834 Body mass index (BMI) 34.0-34.9, adult: Secondary | ICD-10-CM

## 2019-08-13 DIAGNOSIS — I10 Essential (primary) hypertension: Secondary | ICD-10-CM

## 2019-08-13 DIAGNOSIS — E538 Deficiency of other specified B group vitamins: Secondary | ICD-10-CM | POA: Diagnosis not present

## 2019-08-13 DIAGNOSIS — M159 Polyosteoarthritis, unspecified: Secondary | ICD-10-CM

## 2019-08-13 MED ORDER — HYDROCODONE-ACETAMINOPHEN 5-325 MG PO TABS: 1.0000 | ORAL_TABLET | Freq: Two times a day (BID) | ORAL | 0 refills | Status: DC | PRN

## 2019-08-13 MED ORDER — AMLODIPINE-OLMESARTAN 5-40 MG PO TABS: 1.0000 | ORAL_TABLET | Freq: Every day | ORAL | 11 refills | Status: DC

## 2019-08-13 NOTE — Assessment & Plan Note (Signed)
Wt Readings from Last 3 Encounters:  08/13/19 189 lb (85.7 kg)  06/18/19 187 lb (84.8 kg)  05/13/19 183 lb (83 kg)

## 2019-08-13 NOTE — Patient Instructions (Signed)

## 2019-08-13 NOTE — Assessment & Plan Note (Addendum)
Worse Loose wt Cont w/Azor NAS diet

## 2019-08-13 NOTE — Assessment & Plan Note (Signed)
Vit D 

## 2019-08-13 NOTE — Assessment & Plan Note (Addendum)
Norco prn  Potential benefits of a long term opioids use as well as potential risks (i.e. addiction risk, apnea etc) and complications (i.e. Somnolence, constipation and others) were explained to the patient and were aknowledged. 

## 2019-08-13 NOTE — Progress Notes (Addendum)
Subjective:  Patient ID: [name], female    DOB: 07-16-1941  Age: 79 y.o. MRN: [id number]  CC: No chief complaint on file.   HPI Stacey Stayner presents for LBP, anxiety, insomnia.  She continues to have bouts of low back pain arthritis.  She was taking Norco as needed.  She is out of it now.  She is complaining of problems with short-term memory, forgetfulness, forgetting words.  She is less active and has gained weight due to Covid isolation.  She has been stressed out  Outpatient Medications Prior to Visit  Medication Sig Dispense Refill  . acetaminophen (TYLENOL) 500 MG tablet Take 1,000 mg by mouth every 6 (six) hours.    Marland Kitchen amLODipine-olmesartan (AZOR) 5-20 MG tablet Take 1 tablet by mouth daily. 90 tablet 3  . anastrozole (ARIMIDEX) 1 MG tablet TAKE 1 TABLET BY MOUTH  DAILY 90 tablet 3  . Ascorbic Acid (VITAMIN C PO) Take by mouth daily.    Marland Kitchen b complex vitamins tablet Take 1 tablet by mouth daily. 100 tablet 3  . buPROPion (WELLBUTRIN XL) 150 MG 24 hr tablet TAKE 1 TABLET BY MOUTH  DAILY 90 tablet 3  . Cholecalciferol (VITAMIN D3) 50 MCG (2000 UT) capsule Take 1 capsule (2,000 Units total) by mouth daily. 100 capsule 3  . diazepam (VALIUM) 5 MG tablet TAKE 1 TABLET (5 MG TOTAL) BY MOUTH EVERY 12 (TWELVE) HOURS AS NEEDED FOR ANXIETY OR MUSCLE SPASMS. 30 tablet 3  . fexofenadine (ALLEGRA ALLERGY) 180 MG tablet Take 180 mg by mouth daily.    Marland Kitchen ibuprofen (ADVIL,MOTRIN) 400 MG tablet Take 400 mg by mouth 3 (three) times daily.    . polyethylene glycol (MIRALAX / GLYCOLAX) packet Take 17 g by mouth 2 (two) times daily.    . vitamin B-12 (CYANOCOBALAMIN) 1000 MCG tablet Take 1,000 mcg by mouth daily.      Marland Kitchen zolpidem (AMBIEN) 5 MG tablet TAKE 1 TABLET (5 MG TOTAL) BY MOUTH AT BEDTIME AS NEEDED FOR SLEEP. 30 tablet 3   No facility-administered medications prior to visit.    ROS: Review of Systems  Constitutional: Positive for fatigue. Negative for activity  change, appetite change, chills and unexpected weight change.  HENT: Negative for congestion, mouth sores and sinus pressure.   Eyes: Negative for visual disturbance.  Respiratory: Negative for cough and chest tightness.   Gastrointestinal: Negative for abdominal pain and nausea.  Genitourinary: Negative for difficulty urinating, frequency and vaginal pain.  Musculoskeletal: Positive for arthralgias, back pain and gait problem.  Skin: Negative for pallor and rash.  Neurological: Negative for dizziness, tremors, weakness, numbness and headaches.  Psychiatric/Behavioral: Positive for sleep disturbance. Negative for confusion. The patient is nervous/anxious.     Objective:  BP (!) 142/82 (BP Location: Right Arm, Patient Position: Sitting, Cuff Size: Normal)   Pulse (!) 107   Temp 98.6 F (37 C) (Oral)   Ht 5\' 3"  (1.6 m)   Wt 189 lb (85.7 kg)   SpO2 98%   BMI 33.48 kg/m   BP Readings from Last 3 Encounters:  08/13/19 (!) 142/82  06/18/19 120/74  05/13/19 (!) 160/100    Wt Readings from Last 3 Encounters:  08/13/19 189 lb (85.7 kg)  06/18/19 187 lb (84.8 kg)  05/13/19 183 lb (83 kg)    Physical Exam Constitutional:      General: She is not in acute distress.    Appearance: She is well-developed. She is obese.  HENT:  Head: Normocephalic.     Right Ear: External ear normal.     Left Ear: External ear normal.     Nose: Nose normal.  Eyes:     General:        Right eye: No discharge.        Left eye: No discharge.     Conjunctiva/sclera: Conjunctivae normal.     Pupils: Pupils are equal, round, and reactive to light.  Neck:     Thyroid: No thyromegaly.     Vascular: No JVD.     Trachea: No tracheal deviation.  Cardiovascular:     Rate and Rhythm: Normal rate and regular rhythm.     Heart sounds: Normal heart sounds.  Pulmonary:     Effort: No respiratory distress.     Breath sounds: No stridor. No wheezing.  Abdominal:     General: Bowel sounds are normal.  There is no distension.     Palpations: Abdomen is soft. There is no mass.     Tenderness: There is no abdominal tenderness. There is no guarding or rebound.  Musculoskeletal:        General: Tenderness present.     Cervical back: Normal range of motion and neck supple.  Lymphadenopathy:     Cervical: No cervical adenopathy.  Skin:    Findings: No erythema or rash.  Neurological:     Cranial Nerves: No cranial nerve deficit.     Motor: No abnormal muscle tone.     Coordination: Coordination abnormal.     Gait: Gait abnormal.     Deep Tendon Reflexes: Reflexes normal.  Psychiatric:        Behavior: Behavior normal.        Thought Content: Thought content normal.        Judgment: Judgment normal.   LS spine is tender  Lab Results  Component Value Date   WBC 11.8 (H) 06/15/2018   HGB 14.1 06/15/2018   HCT 42.1 06/15/2018   PLT 324 06/15/2018   GLUCOSE 95 01/08/2019   CHOL 221 (H) 10/20/2014   TRIG 140.0 10/20/2014   HDL 54.00 10/20/2014   LDLDIRECT 161.3 06/17/2011   LDLCALC 139 (H) 10/20/2014   ALT 14 01/08/2019   AST 15 01/08/2019   NA 137 01/08/2019   K 4.2 01/08/2019   CL 101 01/08/2019   CREATININE 0.83 01/08/2019   BUN 19 01/08/2019   CO2 26 01/08/2019   TSH 2.33 01/08/2019   INR 1.06 06/12/2018    DG Bone Density  Result Date: 05/16/2019 EXAM: DUAL X-RAY ABSORPTIOMETRY (DXA) FOR BONE MINERAL DENSITY IMPRESSION: Referring Physician:  Chauncey Cruel Your patient completed a BMD test using Lunar IDXA DXA system ( analysis version: 16 ) manufactured by [employer]. Technologist: AW PATIENT: Name: Christina, [name] Patient ID: [id number] Birth Date: [birth year] Height: 63.0 in. Sex: Female Measured: 05/16/2019 Weight: 183.2 lbs. Indications: Advanced Age, Anastrazole, Bilateral hip replacement, Breast Cancer History, Caucasian, Estrogen Deficient, osteoarthiritis, Postmenopausal, Wellbutrin Fractures: None Treatments: Hormone Therapy For Cancer, Vitamin D (E933.5)  ASSESSMENT: The BMD measured at Forearm Radius 33% is 0.828 g/cm2 with a T-score of -0.7. This patient is considered normal according to [facility]) criteria. The scan quality is good. Right femur was excluded due to surgical hardware. Left femur was excluded due to surgical hardware. Site Region Measured Date Measured Age YA BMD Significant CHANGE T-score Left Forearm Radius 33% 05/16/2019 79.5 -[age] 0.828 g/cm2 AP Spine L1-L4 05/16/2019 79.5 0.2 1.215 g/cm2 World  Health Organization [facility]) criteria for post-menopausal, Caucasian Women: Normal       T-score at or above -1 SD Osteopenia   T-score between -1 and -2.5 SD Osteoporosis T-score at or below -2.5 SD RECOMMENDATION: 1. All patients should optimize calcium and vitamin D intake. 2. Consider FDA approved medical therapies in postmenopausal women and men aged 22 years and older, based on the following: a. A hip or vertebral (clinical or morphometric) fracture b. T- score < or = -2.5 at the femoral neck or spine after appropriate evaluation to exclude secondary causes c. Low bone mass (T-score between -1.0 and -2.5 at the femoral neck or spine) and a 10 year probability of a hip fracture > or = 3% or a 10 year probability of a major osteoporosis-related fracture > or = 20% based on the [place]-adapted WHO algorithm d. Clinician judgment and/or patient preferences may indicate treatment for people with 10-year fracture probabilities above or below these levels FOLLOW-UP: Patients with diagnosis of osteoporosis or at high risk for fracture should have regular bone mineral density tests. For patients eligible for Medicare, routine testing is allowed once every 2 years. The testing frequency can be increased to one year for patients who have rapidly progressing disease, those who are receiving or discontinuing medical therapy to restore bone mass, or have additional risk factors. I have reviewed this report and agree with the above findings. [facility]  Radiology Electronically Signed   By: Lowella Grip III M.D.   On: 05/16/2019 13:45    Assessment & Plan:   There are no diagnoses linked to this encounter.   No orders of the defined types were placed in this encounter.    Follow-up: No follow-ups on file.  [name], MD

## 2019-08-13 NOTE — Assessment & Plan Note (Signed)
Stress is not helping.   I suggested that [name] starts walking outside.  Continue with vitamin B complex.  Manage stress

## 2019-08-13 NOTE — Assessment & Plan Note (Signed)
On B12 

## 2019-11-11 ENCOUNTER — Other Ambulatory Visit: Payer: Self-pay

## 2019-11-11 ENCOUNTER — Ambulatory Visit (INDEPENDENT_AMBULATORY_CARE_PROVIDER_SITE_OTHER): Payer: Medicare Other | Admitting: Internal Medicine

## 2019-11-11 ENCOUNTER — Encounter: Payer: Self-pay | Admitting: Internal Medicine

## 2019-11-11 DIAGNOSIS — E538 Deficiency of other specified B group vitamins: Secondary | ICD-10-CM

## 2019-11-11 DIAGNOSIS — G4700 Insomnia, unspecified: Secondary | ICD-10-CM

## 2019-11-11 DIAGNOSIS — I10 Essential (primary) hypertension: Secondary | ICD-10-CM

## 2019-11-11 DIAGNOSIS — R413 Other amnesia: Secondary | ICD-10-CM | POA: Diagnosis not present

## 2019-11-11 MED ORDER — ZOLPIDEM TARTRATE ER 12.5 MG PO TBCR: 12.5000 mg | EXTENDED_RELEASE_TABLET | Freq: Every evening | ORAL | 5 refills | Status: DC | PRN

## 2019-11-11 MED ORDER — HYDROCODONE-ACETAMINOPHEN 5-325 MG PO TABS: 1.0000 | ORAL_TABLET | Freq: Two times a day (BID) | ORAL | 0 refills | Status: DC | PRN

## 2019-11-11 NOTE — Assessment & Plan Note (Signed)
Azor 

## 2019-11-11 NOTE — Assessment & Plan Note (Signed)
Worse D/c Zolpidem Try Valerian root Start Zolpidem CR

## 2019-11-11 NOTE — Assessment & Plan Note (Signed)
On b12 

## 2019-11-11 NOTE — Progress Notes (Signed)
Subjective:  Patient ID: [name], female    DOB: 1941-03-26  Age: 79 y.o. MRN: [id number]  CC: No chief complaint on file.   HPI [name] presents for HTN, LBP, depression f/u [name] is not helping.. Insomnia is worse   Outpatient Medications Prior to Visit  Medication Sig Dispense Refill  . acetaminophen (TYLENOL) 500 MG tablet Take 1,000 mg by mouth every 6 (six) hours.    Marland Kitchen amLODipine-olmesartan (AZOR) 5-40 MG tablet Take 1 tablet by mouth daily. 30 tablet 11  . anastrozole (ARIMIDEX) 1 MG tablet TAKE 1 TABLET BY MOUTH  DAILY 90 tablet 3  . Ascorbic Acid (VITAMIN C PO) Take by mouth daily.    Marland Kitchen b complex vitamins tablet Take 1 tablet by mouth daily. 100 tablet 3  . buPROPion (WELLBUTRIN XL) 150 MG 24 hr tablet TAKE 1 TABLET BY MOUTH  DAILY 90 tablet 3  . Cholecalciferol (VITAMIN D3) 50 MCG (2000 UT) capsule Take 1 capsule (2,000 Units total) by mouth daily. 100 capsule 3  . diazepam (VALIUM) 5 MG tablet TAKE 1 TABLET (5 MG TOTAL) BY MOUTH EVERY 12 (TWELVE) HOURS AS NEEDED FOR ANXIETY OR MUSCLE SPASMS. 30 tablet 3  . fexofenadine (ALLEGRA ALLERGY) 180 MG tablet Take 180 mg by mouth daily.    Marland Kitchen HYDROcodone-acetaminophen (NORCO/VICODIN) 5-325 MG tablet Take 1 tablet by mouth 2 (two) times daily as needed for severe pain. 60 tablet 0  . ibuprofen (ADVIL,MOTRIN) 400 MG tablet Take 400 mg by mouth 3 (three) times daily.    . polyethylene glycol (MIRALAX / GLYCOLAX) packet Take 17 g by mouth 2 (two) times daily.    . vitamin B-12 (CYANOCOBALAMIN) 1000 MCG tablet Take 1,000 mcg by mouth daily.      Marland Kitchen zolpidem (AMBIEN) 5 MG tablet TAKE 1 TABLET (5 MG TOTAL) BY MOUTH AT BEDTIME AS NEEDED FOR SLEEP. 30 tablet 3   No facility-administered medications prior to visit.    ROS: Review of Systems  Constitutional: Negative for activity change, appetite change, chills, fatigue and unexpected weight change.  HENT: Negative for congestion, mouth sores and sinus  pressure.   Eyes: Negative for visual disturbance.  Respiratory: Negative for cough and chest tightness.   Gastrointestinal: Negative for abdominal pain and nausea.  Genitourinary: Negative for difficulty urinating, frequency and vaginal pain.  Musculoskeletal: Positive for back pain and gait problem.  Skin: Negative for pallor and rash.  Neurological: Negative for dizziness, tremors, weakness, numbness and headaches.  Psychiatric/Behavioral: Positive for decreased concentration. Negative for confusion and sleep disturbance. The patient is nervous/anxious.     Objective:  BP 120/72 (BP Location: Right Arm, Patient Position: Sitting, Cuff Size: Large)   Pulse 86   Temp 98.3 F (36.8 C) (Oral)   Ht 5\' 3"  (1.6 m)   Wt 183 lb (83 kg)   SpO2 98%   BMI 32.42 kg/m   BP Readings from Last 3 Encounters:  11/11/19 120/72  08/13/19 (!) 142/82  06/18/19 120/74    Wt Readings from Last 3 Encounters:  11/11/19 183 lb (83 kg)  08/13/19 189 lb (85.7 kg)  06/18/19 187 lb (84.8 kg)    Physical Exam Constitutional:      General: She is not in acute distress.    Appearance: She is well-developed.  HENT:     Head: Normocephalic.     Right Ear: External ear normal.     Left Ear: External ear normal.     Nose: Nose normal.  Eyes:     General:        Right eye: No discharge.        Left eye: No discharge.     Conjunctiva/sclera: Conjunctivae normal.     Pupils: Pupils are equal, round, and reactive to light.  Neck:     Thyroid: No thyromegaly.     Vascular: No JVD.     Trachea: No tracheal deviation.  Cardiovascular:     Rate and Rhythm: Normal rate and regular rhythm.     Heart sounds: Normal heart sounds.  Pulmonary:     Effort: No respiratory distress.     Breath sounds: No stridor. No wheezing.  Abdominal:     General: Bowel sounds are normal. There is no distension.     Palpations: Abdomen is soft. There is no mass.     Tenderness: There is no abdominal tenderness. There is  no guarding or rebound.  Musculoskeletal:        General: Tenderness present.     Cervical back: Normal range of motion and neck supple.  Lymphadenopathy:     Cervical: No cervical adenopathy.  Skin:    Findings: No erythema or rash.  Neurological:     Cranial Nerves: No cranial nerve deficit.     Motor: No abnormal muscle tone.     Coordination: Coordination normal.     Deep Tendon Reflexes: Reflexes normal.  Psychiatric:        Behavior: Behavior normal.        Thought Content: Thought content normal.        Judgment: Judgment normal.   LS is tender  Lab Results  Component Value Date   WBC 11.8 (H) 06/15/2018   HGB 14.1 06/15/2018   HCT 42.1 06/15/2018   PLT 324 06/15/2018   GLUCOSE 95 01/08/2019   CHOL 221 (H) 10/20/2014   TRIG 140.0 10/20/2014   HDL 54.00 10/20/2014   LDLDIRECT 161.3 06/17/2011   LDLCALC 139 (H) 10/20/2014   ALT 14 01/08/2019   AST 15 01/08/2019   NA 137 01/08/2019   K 4.2 01/08/2019   CL 101 01/08/2019   CREATININE 0.83 01/08/2019   BUN 19 01/08/2019   CO2 26 01/08/2019   TSH 2.33 01/08/2019   INR 1.06 06/12/2018    DG Bone Density  Result Date: 05/16/2019 EXAM: DUAL X-RAY ABSORPTIOMETRY (DXA) FOR BONE MINERAL DENSITY IMPRESSION: Referring Physician:  Chauncey Cruel Your patient completed a BMD test using Lunar IDXA DXA system ( analysis version: 16 ) manufactured by [employer]. Technologist: AW PATIENT: Name: Christina, [name] Patient ID: [id number] Birth Date: 03-16-1941 Height: 63.0 in. Sex: Female Measured: 05/16/2019 Weight: 183.2 lbs. Indications: Advanced Age, Anastrazole, Bilateral hip replacement, Breast Cancer History, Caucasian, Estrogen Deficient, osteoarthiritis, Postmenopausal, Wellbutrin Fractures: None Treatments: Hormone Therapy For Cancer, Vitamin D (E933.5) ASSESSMENT: The BMD measured at Forearm Radius 33% is 0.828 g/cm2 with a T-score of -0.7. This patient is considered normal according to [facility])  criteria. The scan quality is good. Right femur was excluded due to surgical hardware. Left femur was excluded due to surgical hardware. Site Region Measured Date Measured Age YA BMD Significant CHANGE T-score Left Forearm Radius 33% 05/16/2019 79.5 -0.6 0.828 g/cm2 AP Spine L1-L4 05/16/2019 79.5 0.3 1.215 g/cm2 World Health Organization [facility]) criteria for post-menopausal, Caucasian Women: Normal       T-score at or above -1 SD Osteopenia   T-score between -1 and -2.5 SD Osteoporosis T-score at or below -2.5  SD RECOMMENDATION: 1. All patients should optimize calcium and vitamin D intake. 2. Consider FDA approved medical therapies in postmenopausal women and men aged 6 years and older, based on the following: a. A hip or vertebral (clinical or morphometric) fracture b. T- score < or = -2.5 at the femoral neck or spine after appropriate evaluation to exclude secondary causes c. Low bone mass (T-score between -1.0 and -2.5 at the femoral neck or spine) and a 10 year probability of a hip fracture > or = 3% or a 10 year probability of a major osteoporosis-related fracture > or = 20% based on the [place]-adapted WHO algorithm d. Clinician judgment and/or patient preferences may indicate treatment for people with 10-year fracture probabilities above or below these levels FOLLOW-UP: Patients with diagnosis of osteoporosis or at high risk for fracture should have regular bone mineral density tests. For patients eligible for Medicare, routine testing is allowed once every 2 years. The testing frequency can be increased to one year for patients who have rapidly progressing disease, those who are receiving or discontinuing medical therapy to restore bone mass, or have additional risk factors. I have reviewed this report and agree with the above findings. [facility] Radiology Electronically Signed   By: Lowella Grip III M.D.   On: 05/16/2019 13:45    Assessment & Plan:     Follow-up: No follow-ups on file.  [name], MD

## 2019-11-11 NOTE — Assessment & Plan Note (Signed)
Get more sleep - Try Valerian root Start Zolpidem CR

## 2019-11-11 NOTE — Patient Instructions (Addendum)
Wt Readings from Last 3 Encounters:  11/11/19 183 lb (83 kg)  08/13/19 189 lb (85.7 kg)  06/18/19 187 lb (84.8 kg)   Try a wedge pillow

## 2019-11-13 ENCOUNTER — Telehealth: Payer: Self-pay | Admitting: Internal Medicine

## 2019-11-13 MED ORDER — HYDROCODONE-ACETAMINOPHEN 5-325 MG PO TABS: 1.0000 | ORAL_TABLET | Freq: Two times a day (BID) | ORAL | 0 refills | Status: AC | PRN

## 2019-11-13 NOTE — Telephone Encounter (Signed)
Ok thx.

## 2019-11-13 NOTE — Telephone Encounter (Signed)
New Message:   Pt is calling and states that her HYDROcodone-acetaminophen (NORCO/VICODIN) 5-325 MG tablet cost less at [facility] [address] [place], [place] and would like the prescription sent to [facility]. Please advise.

## 2019-11-28 ENCOUNTER — Telehealth: Payer: Self-pay

## 2019-11-28 NOTE — Telephone Encounter (Signed)
F/u    Call the patient informed of the message from [place].   The patient verbalized she does not need an appt this is a recommendation from her oncology office due to her history of cancer, advise to takes amoxicillin before a dental appt, patient states Dr. Alain Marion is aware of this, I guess I will not be making an appointment to see my dentist's office.   No appointment is made at this time with Dr.Plotnikov.

## 2019-11-28 NOTE — Telephone Encounter (Signed)
1.Medication Requested: amoxicillin last filled 11.19.2019  2. Pharm[id number] [place], [place]. AT [address]  3. On Med List: no   4. Last Visit with PCP: 4.12.2021   5. Next visit date with PCP: 7.14.2021    Agent: Please be advised that RX refills may take up to 3 business days. We ask that you follow-up with your pharmacy.

## 2019-11-28 NOTE — Telephone Encounter (Signed)
Please advise about refill for amoxicillin for dental appt

## 2019-11-28 NOTE — Telephone Encounter (Signed)
This is an antibiotic, if pt needs refill, she needs an OV

## 2019-11-29 NOTE — Telephone Encounter (Signed)
Okay 3 refills.  Thanks

## 2019-12-02 MED ORDER — AMOXICILLIN 500 MG PO CAPS: ORAL_CAPSULE | ORAL | 3 refills | Status: DC

## 2019-12-02 NOTE — Telephone Encounter (Signed)
RX sent

## 2019-12-18 ENCOUNTER — Other Ambulatory Visit: Payer: Self-pay | Admitting: Internal Medicine

## 2019-12-18 DIAGNOSIS — F419 Anxiety disorder, unspecified: Secondary | ICD-10-CM

## 2020-01-02 ENCOUNTER — Other Ambulatory Visit: Payer: Self-pay | Admitting: Oncology

## 2020-01-02 DIAGNOSIS — Z9889 Other specified postprocedural states: Secondary | ICD-10-CM

## 2020-01-31 ENCOUNTER — Other Ambulatory Visit: Payer: Self-pay | Admitting: *Deleted

## 2020-01-31 DIAGNOSIS — Z170 Estrogen receptor positive status [ER+]: Secondary | ICD-10-CM

## 2020-01-31 DIAGNOSIS — C50412 Malignant neoplasm of upper-outer quadrant of left female breast: Secondary | ICD-10-CM
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
## 2020-02-04 ENCOUNTER — Inpatient Hospital Stay: Payer: Medicare Other

## 2020-02-04 ENCOUNTER — Inpatient Hospital Stay: Payer: Medicare Other | Attending: Oncology | Admitting: Oncology

## 2020-02-04 ENCOUNTER — Other Ambulatory Visit: Payer: Self-pay

## 2020-02-04 VITALS — BP 140/74 | HR 99 | Temp 97.4°F | Resp 18 | Ht 63.0 in | Wt 183.3 lb

## 2020-02-04 DIAGNOSIS — C50412 Malignant neoplasm of upper-outer quadrant of left female breast: Secondary | ICD-10-CM | POA: Insufficient documentation

## 2020-02-04 DIAGNOSIS — Z170 Estrogen receptor positive status [ER+]: Secondary | ICD-10-CM | POA: Insufficient documentation

## 2020-02-04 DIAGNOSIS — Z87891 Personal history of nicotine dependence: Secondary | ICD-10-CM | POA: Insufficient documentation

## 2020-02-04 DIAGNOSIS — M8580 Other specified disorders of bone density and structure, unspecified site: Secondary | ICD-10-CM | POA: Diagnosis not present

## 2020-02-04 LAB — CBC WITH DIFFERENTIAL (CANCER CENTER ONLY)
Abs Immature Granulocytes: 0.01 10*3/uL (ref 0.00–0.07)
Basophils Absolute: 0 10*3/uL (ref 0.0–0.1)
Basophils Relative: 1 %
Eosinophils Absolute: 0.1 10*3/uL (ref 0.0–0.5)
Eosinophils Relative: 1 %
HCT: 39.7 % (ref 36.0–46.0)
Hemoglobin: 13.3 g/dL (ref 12.0–15.0)
Immature Granulocytes: 0 %
Lymphocytes Relative: 25 %
Lymphs Abs: 2 10*3/uL (ref 0.7–4.0)
MCH: 32.6 pg (ref 26.0–34.0)
MCHC: 33.5 g/dL (ref 30.0–36.0)
MCV: 97.3 fL (ref 80.0–100.0)
Monocytes Absolute: 0.8 10*3/uL (ref 0.1–1.0)
Monocytes Relative: 10 %
Neutro Abs: 4.9 10*3/uL (ref 1.7–7.7)
Neutrophils Relative %: 63 %
Platelet Count: 330 10*3/uL (ref 150–400)
RBC: 4.08 MIL/uL (ref 3.87–5.11)
RDW: 12.7 % (ref 11.5–15.5)
WBC Count: 7.8 10*3/uL (ref 4.0–10.5)
nRBC: 0 % (ref 0.0–0.2)

## 2020-02-04 LAB — CMP (CANCER CENTER ONLY)
ALT: 19 U/L (ref 0–44)
AST: 17 U/L (ref 15–41)
Albumin: 3.8 g/dL (ref 3.5–5.0)
Alkaline Phosphatase: 81 U/L (ref 38–126)
Anion gap: 9 (ref 5–15)
BUN: 14 mg/dL (ref 8–23)
CO2: 22 mmol/L (ref 22–32)
Calcium: 9.5 mg/dL (ref 8.9–10.3)
Chloride: 106 mmol/L (ref 98–111)
Creatinine: 0.91 mg/dL (ref 0.44–1.00)
GFR, Est AFR Am: >60 mL/min (ref >60)
GFR, Estimated: 60 mL/min — ABNORMAL LOW (ref >60)
Glucose, Bld: 100 mg/dL — ABNORMAL HIGH (ref 70–99)
Potassium: 4.1 mmol/L (ref 3.5–5.1)
Sodium: 137 mmol/L (ref 135–145)
Total Bilirubin: 0.4 mg/dL (ref 0.3–1.2)
Total Protein: 7.3 g/dL (ref 6.5–8.1)

## 2020-02-04 MED ORDER — ANASTROZOLE 1 MG PO TABS: 1.0000 mg | ORAL_TABLET | Freq: Every day | ORAL | 4 refills | Status: DC

## 2020-02-05 ENCOUNTER — Telehealth: Payer: Self-pay | Admitting: Oncology

## 2020-02-05 NOTE — Telephone Encounter (Signed)
Scheduled appts per 7/6 los. Left voicemail with appt date and time.  

## 2020-02-12 ENCOUNTER — Encounter: Payer: Self-pay | Admitting: Internal Medicine

## 2020-02-12 ENCOUNTER — Ambulatory Visit (INDEPENDENT_AMBULATORY_CARE_PROVIDER_SITE_OTHER): Payer: Medicare Other | Admitting: Internal Medicine

## 2020-02-12 ENCOUNTER — Other Ambulatory Visit: Payer: Self-pay

## 2020-02-12 DIAGNOSIS — G4700 Insomnia, unspecified: Secondary | ICD-10-CM | POA: Diagnosis not present

## 2020-02-12 DIAGNOSIS — I10 Essential (primary) hypertension: Secondary | ICD-10-CM | POA: Diagnosis not present

## 2020-02-12 DIAGNOSIS — D518 Other vitamin B12 deficiency anemias: Secondary | ICD-10-CM

## 2020-02-12 DIAGNOSIS — R21 Rash and other nonspecific skin eruption: Secondary | ICD-10-CM

## 2020-02-12 DIAGNOSIS — E559 Vitamin D deficiency, unspecified: Secondary | ICD-10-CM

## 2020-02-12 MED ORDER — LORATADINE 10 MG PO TABS: 10.0000 mg | ORAL_TABLET | Freq: Every day | ORAL | 11 refills | Status: AC

## 2020-02-12 MED ORDER — AMLODIPINE BESYLATE 5 MG PO TABS
5.0000 mg | ORAL_TABLET | Freq: Every day | ORAL | 3 refills | Status: DC
Start: 2020-02-12 — End: 2021-01-29

## 2020-02-12 MED ORDER — LOSARTAN POTASSIUM 100 MG PO TABS
100.0000 mg | ORAL_TABLET | Freq: Every day | ORAL | 3 refills | Status: DC
Start: 2020-02-12 — End: 2021-01-29

## 2020-02-12 NOTE — Assessment & Plan Note (Signed)
On B12 

## 2020-02-12 NOTE — Assessment & Plan Note (Signed)
Vit D 

## 2020-02-12 NOTE — Patient Instructions (Addendum)
Read about [facility]  Stop Azor Start Amlodipine and Losartan

## 2020-02-12 NOTE — Assessment & Plan Note (Signed)
Recent ?etiology: could be Olmesartan related

## 2020-02-12 NOTE — Progress Notes (Signed)
Subjective:  Patient ID: [name], female    DOB: 08/24/1940  Age: 79 y.o. MRN: [id number]  CC: No chief complaint on file.   HPI [name] presents for itchy skin at home x1 month and some rash C/o slight lightheadedness/dizziness at times C/o HAs C/o DOE - not new F/u LBP  Outpatient Medications Prior to Visit  Medication Sig Dispense Refill  . acetaminophen (TYLENOL) 500 MG tablet Take 1,000 mg by mouth every 6 (six) hours.    Marland Kitchen amLODipine-olmesartan (AZOR) 5-40 MG tablet Take 1 tablet by mouth daily. 30 tablet 11  . amoxicillin (AMOXIL) 500 MG capsule TAKE 4 CAPSULES (2,000 MG TOTAL) BY MOUTH DAILY. FOR DENTAL VISITS. 20 capsule 3  . anastrozole (ARIMIDEX) 1 MG tablet Take 1 tablet (1 mg total) by mouth daily. 90 tablet 4  . Ascorbic Acid (VITAMIN C PO) Take by mouth daily.    Marland Kitchen b complex vitamins tablet Take 1 tablet by mouth daily. 100 tablet 3  . buPROPion (WELLBUTRIN XL) 150 MG 24 hr tablet TAKE 1 TABLET BY MOUTH  DAILY 90 tablet 3  . Cholecalciferol (VITAMIN D3) 50 MCG (2000 UT) capsule Take 1 capsule (2,000 Units total) by mouth daily. 100 capsule 3  . diazepam (VALIUM) 5 MG tablet TAKE 1 TABLET BY MOUTH EVERY 12 HOURS AS NEEDED FOR ANXIETY OR MUSCLE SPASMS. 30 tablet 3  . fexofenadine (ALLEGRA ALLERGY) 180 MG tablet Take 180 mg by mouth daily.    Marland Kitchen HYDROcodone-acetaminophen (NORCO/VICODIN) 5-325 MG tablet Take 1 tablet by mouth 2 (two) times daily as needed for severe pain. 60 tablet 0  . ibuprofen (ADVIL,MOTRIN) 400 MG tablet Take 400 mg by mouth 3 (three) times daily.    . polyethylene glycol (MIRALAX / GLYCOLAX) packet Take 17 g by mouth 2 (two) times daily.    Marland Kitchen zolpidem (AMBIEN CR) 12.5 MG CR tablet Take 1 tablet (12.5 mg total) by mouth at bedtime as needed for sleep. 30 tablet 5  . vitamin B-12 (CYANOCOBALAMIN) 1000 MCG tablet Take 1,000 mcg by mouth daily.   (Patient not taking: Reported on 02/12/2020)     No facility-administered  medications prior to visit.    ROS: Review of Systems  Constitutional: Negative for activity change, appetite change, chills, fatigue and unexpected weight change.  HENT: Negative for congestion, mouth sores and sinus pressure.   Eyes: Negative for visual disturbance.  Respiratory: Negative for cough and chest tightness.   Gastrointestinal: Negative for abdominal pain and nausea.  Genitourinary: Negative for difficulty urinating, frequency and vaginal pain.  Musculoskeletal: Positive for back pain. Negative for gait problem.  Skin: Negative for pallor and rash.  Neurological: Negative for dizziness, tremors, weakness, numbness and headaches.  Psychiatric/Behavioral: Positive for decreased concentration and sleep disturbance. Negative for confusion. The patient is nervous/anxious.     Objective:  BP (!) 122/56 (BP Location: Right Arm, Patient Position: Sitting, Cuff Size: Large)   Pulse 92   Temp 97.7 F (36.5 C) (Oral)   Ht 5\' 3"  (1.6 m)   Wt 184 lb (83.5 kg)   SpO2 97%   BMI 32.59 kg/m   BP Readings from Last 3 Encounters:  02/12/20 (!) 122/56  02/04/20 140/74  11/11/19 120/72    Wt Readings from Last 3 Encounters:  02/12/20 184 lb (83.5 kg)  02/04/20 183 lb 4.8 oz (83.1 kg)  11/11/19 183 lb (83 kg)    Physical Exam Constitutional:      General: She is not in  acute distress.    Appearance: She is well-developed.  HENT:     Head: Normocephalic.     Right Ear: External ear normal.     Left Ear: External ear normal.     Nose: Nose normal.  Eyes:     General:        Right eye: No discharge.        Left eye: No discharge.     Conjunctiva/sclera: Conjunctivae normal.     Pupils: Pupils are equal, round, and reactive to light.  Neck:     Thyroid: No thyromegaly.     Vascular: No JVD.     Trachea: No tracheal deviation.  Cardiovascular:     Rate and Rhythm: Normal rate and regular rhythm.     Heart sounds: Normal heart sounds.  Pulmonary:     Effort: No  respiratory distress.     Breath sounds: No stridor. No wheezing.  Abdominal:     General: Bowel sounds are normal. There is no distension.     Palpations: Abdomen is soft. There is no mass.     Tenderness: There is no abdominal tenderness. There is no guarding or rebound.  Musculoskeletal:        General: Tenderness present.     Cervical back: Normal range of motion and neck supple.  Lymphadenopathy:     Cervical: No cervical adenopathy.  Skin:    Findings: No erythema or rash.  Neurological:     Mental Status: She is oriented to person, place, and time.     Cranial Nerves: No cranial nerve deficit.     Motor: No abnormal muscle tone.     Coordination: Coordination normal.     Deep Tendon Reflexes: Reflexes normal.  Psychiatric:        Behavior: Behavior normal.        Thought Content: Thought content normal.        Judgment: Judgment normal.     Lab Results  Component Value Date   WBC 7.8 02/04/2020   HGB 13.3 02/04/2020   HCT 39.7 02/04/2020   PLT 330 02/04/2020   GLUCOSE 100 (H) 02/04/2020   CHOL 221 (H) 10/20/2014   TRIG 140.0 10/20/2014   HDL 54.00 10/20/2014   LDLDIRECT 161.3 06/17/2011   LDLCALC 139 (H) 10/20/2014   ALT 19 02/04/2020   AST 17 02/04/2020   NA 137 02/04/2020   K 4.1 02/04/2020   CL 106 02/04/2020   CREATININE 0.91 02/04/2020   BUN 14 02/04/2020   CO2 22 02/04/2020   TSH 2.33 01/08/2019   INR 1.06 06/12/2018    DG Bone Density  Result Date: 05/16/2019 EXAM: DUAL X-RAY ABSORPTIOMETRY (DXA) FOR BONE MINERAL DENSITY IMPRESSION: Referring Physician:  Chauncey Cruel Your patient completed a BMD test using Lunar IDXA DXA system ( analysis version: 16 ) manufactured by [employer]. Technologist: AW PATIENT: Name: Christina, [name] Patient ID: [id number] Birth Date: 01-15-1941 Height: 63.0 in. Sex: Female Measured: 05/16/2019 Weight: 183.2 lbs. Indications: Advanced Age, Anastrazole, Bilateral hip replacement, Breast Cancer History, Caucasian,  Estrogen Deficient, osteoarthiritis, Postmenopausal, Wellbutrin Fractures: None Treatments: Hormone Therapy For Cancer, Vitamin D (E933.5) ASSESSMENT: The BMD measured at Forearm Radius 33% is 0.828 g/cm2 with a T-score of -0.7. This patient is considered normal according to [facility]) criteria. The scan quality is good. Right femur was excluded due to surgical hardware. Left femur was excluded due to surgical hardware. Site Region Measured Date Measured Age YA BMD Significant CHANGE  T-score Left Forearm Radius 33% 05/16/2019 79.5 -0.7 0.828 g/cm2 AP Spine L1-L4 05/16/2019 79.5 0.3 1.215 g/cm2 World Health Organization [facility]) criteria for post-menopausal, Caucasian Women: Normal       T-score at or above -1 SD Osteopenia   T-score between -1 and -2.5 SD Osteoporosis T-score at or below -2.5 SD RECOMMENDATION: 1. All patients should optimize calcium and vitamin D intake. 2. Consider FDA approved medical therapies in postmenopausal women and men aged 3 years and older, based on the following: a. A hip or vertebral (clinical or morphometric) fracture b. T- score < or = -2.5 at the femoral neck or spine after appropriate evaluation to exclude secondary causes c. Low bone mass (T-score between -1.0 and -2.5 at the femoral neck or spine) and a 10 year probability of a hip fracture > or = 3% or a 10 year probability of a major osteoporosis-related fracture > or = 20% based on the [place]-adapted WHO algorithm d. Clinician judgment and/or patient preferences may indicate treatment for people with 10-year fracture probabilities above or below these levels FOLLOW-UP: Patients with diagnosis of osteoporosis or at high risk for fracture should have regular bone mineral density tests. For patients eligible for Medicare, routine testing is allowed once every 2 years. The testing frequency can be increased to one year for patients who have rapidly progressing disease, those who are receiving or discontinuing  medical therapy to restore bone mass, or have additional risk factors. I have reviewed this report and agree with the above findings. [facility] Electronically Signed   By: Lowella Grip III M.D.   On: 05/16/2019 13:45    Assessment & Plan:   There are no diagnoses linked to this encounter.   No orders of the defined types were placed in this encounter.    Follow-up: No follow-ups on file.  [name], MD

## 2020-02-12 NOTE — Assessment & Plan Note (Signed)
Azor 

## 2020-02-12 NOTE — Assessment & Plan Note (Signed)
Zolpidem CR low dose.

## 2020-02-13 ENCOUNTER — Ambulatory Visit
Admission: RE | Admit: 2020-02-13 | Discharge: 2020-02-13 | Disposition: A | Discharge status: Home / Self Care | Payer: Medicare Other | Source: Ambulatory Visit | Attending: Oncology | Admitting: Oncology

## 2020-02-13 DIAGNOSIS — Z9889 Other specified postprocedural states: Secondary | ICD-10-CM

## 2020-02-15 ENCOUNTER — Other Ambulatory Visit: Payer: Self-pay | Admitting: Oncology

## 2020-02-15 DIAGNOSIS — F339 Major depressive disorder, recurrent, unspecified: Secondary | ICD-10-CM

## 2020-02-24 ENCOUNTER — Encounter (HOSPITAL_COMMUNITY): Payer: Self-pay | Admitting: Emergency Medicine

## 2020-02-24 ENCOUNTER — Other Ambulatory Visit: Payer: Self-pay

## 2020-02-24 ENCOUNTER — Emergency Department (HOSPITAL_COMMUNITY): Payer: Medicare Other

## 2020-02-24 ENCOUNTER — Emergency Department (HOSPITAL_COMMUNITY)
Admission: EM | Admit: 2020-02-24 | Discharge: 2020-02-24 | Disposition: A | Discharge status: Home / Self Care | Payer: Medicare Other | Attending: Emergency Medicine | Admitting: Emergency Medicine

## 2020-02-24 DIAGNOSIS — Z87891 Personal history of nicotine dependence: Secondary | ICD-10-CM | POA: Insufficient documentation

## 2020-02-24 DIAGNOSIS — Z7982 Long term (current) use of aspirin: Secondary | ICD-10-CM | POA: Insufficient documentation

## 2020-02-24 DIAGNOSIS — Z853 Personal history of malignant neoplasm of breast: Secondary | ICD-10-CM | POA: Diagnosis not present

## 2020-02-24 DIAGNOSIS — S0101XA Laceration without foreign body of scalp, initial encounter: Secondary | ICD-10-CM | POA: Insufficient documentation

## 2020-02-24 DIAGNOSIS — Y9389 Activity, other specified: Secondary | ICD-10-CM | POA: Insufficient documentation

## 2020-02-24 DIAGNOSIS — I10 Essential (primary) hypertension: Secondary | ICD-10-CM | POA: Insufficient documentation

## 2020-02-24 DIAGNOSIS — Y999 Unspecified external cause status: Secondary | ICD-10-CM | POA: Diagnosis not present

## 2020-02-24 DIAGNOSIS — Y9289 Other specified places as the place of occurrence of the external cause: Secondary | ICD-10-CM | POA: Insufficient documentation

## 2020-02-24 DIAGNOSIS — Z79899 Other long term (current) drug therapy: Secondary | ICD-10-CM | POA: Diagnosis not present

## 2020-02-24 DIAGNOSIS — W1830XA Fall on same level, unspecified, initial encounter: Secondary | ICD-10-CM | POA: Diagnosis not present

## 2020-02-24 MED ORDER — LIDOCAINE HCL (PF) 1 % IJ SOLN
10.0000 mL | Freq: Once | INTRAMUSCULAR | Status: AC
  Administered 2020-02-24: 10 mL via INTRADERMAL
  Filled 2020-02-24: qty 30

## 2020-02-24 MED ORDER — OXYCODONE-ACETAMINOPHEN 5-325 MG PO TABS
1.0000 | ORAL_TABLET | Freq: Once | ORAL | Status: AC
  Administered 2020-02-24: 1 via ORAL
  Filled 2020-02-24: qty 1

## 2020-02-24 MED ORDER — LIDOCAINE-EPINEPHRINE-TETRACAINE (LET) TOPICAL GEL
3.0000 mL | Freq: Once | TOPICAL | Status: AC
  Administered 2020-02-24: 3 mL via TOPICAL
  Filled 2020-02-24: qty 3
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
## 2020-02-24 NOTE — ED Triage Notes (Addendum)
Patient reports falling and hitting back of head approx 30 minutes ago. Bleeding controlled. Denies LOC. Denies taking blood thinners

## 2020-02-24 NOTE — Discharge Instructions (Addendum)
You can take 600 mg of ibuprofen every 6 hours, you can take 1000 mg of Tylenol every 6 hours, you can alternate these every 3 or you can take them together.  Have your staples removed in 7-10 days.  You can shower and let warm soapy water run over the staples but do not submerge them.  Pat the area dry do not scrub

## 2020-03-03 ENCOUNTER — Other Ambulatory Visit: Payer: Self-pay

## 2020-03-03 ENCOUNTER — Encounter: Payer: Self-pay | Admitting: Internal Medicine

## 2020-03-03 ENCOUNTER — Ambulatory Visit (INDEPENDENT_AMBULATORY_CARE_PROVIDER_SITE_OTHER): Payer: Medicare Other | Admitting: Internal Medicine

## 2020-03-03 DIAGNOSIS — S0101XD Laceration without foreign body of scalp, subsequent encounter: Secondary | ICD-10-CM | POA: Insufficient documentation

## 2020-03-03 DIAGNOSIS — S060X9A Concussion with loss of consciousness of unspecified duration, initial encounter: Secondary | ICD-10-CM | POA: Insufficient documentation

## 2020-03-03 DIAGNOSIS — S060XAA Concussion with loss of consciousness status unknown, initial encounter: Secondary | ICD-10-CM | POA: Insufficient documentation

## 2020-03-03 DIAGNOSIS — S060X0D Concussion without loss of consciousness, subsequent encounter: Secondary | ICD-10-CM | POA: Diagnosis not present

## 2020-03-03 NOTE — Progress Notes (Signed)
Subjective:  Patient ID: [name], female    DOB: 1941/06/16  Age: 79 y.o. MRN: [id number]  CC: Follow-up (ER follow-up. Need staples taken out of the back of her head)   HPI [name] presents for a possible concussion and head laceration on 7/26  Outpatient Medications Prior to Visit  Medication Sig Dispense Refill  . acetaminophen (TYLENOL) 500 MG tablet Take 1,000 mg by mouth every 6 (six) hours.    Marland Kitchen amLODipine (NORVASC) 5 MG tablet Take 1 tablet (5 mg total) by mouth daily. 90 tablet 3  . anastrozole (ARIMIDEX) 1 MG tablet Take 1 tablet (1 mg total) by mouth daily. 90 tablet 4  . Ascorbic Acid (VITAMIN C PO) Take by mouth daily.    Marland Kitchen b complex vitamins tablet Take 1 tablet by mouth daily. 100 tablet 3  . buPROPion (WELLBUTRIN XL) 150 MG 24 hr tablet TAKE 1 TABLET BY MOUTH  DAILY 90 tablet 3  . Cholecalciferol (VITAMIN D3) 50 MCG (2000 UT) capsule Take 1 capsule (2,000 Units total) by mouth daily. 100 capsule 3  . diazepam (VALIUM) 5 MG tablet TAKE 1 TABLET BY MOUTH EVERY 12 HOURS AS NEEDED FOR ANXIETY OR MUSCLE SPASMS. 30 tablet 3  . fexofenadine (ALLEGRA ALLERGY) 180 MG tablet Take 180 mg by mouth daily.    Marland Kitchen HYDROcodone-acetaminophen (NORCO/VICODIN) 5-325 MG tablet Take 1 tablet by mouth 2 (two) times daily as needed for severe pain. 60 tablet 0  . ibuprofen (ADVIL,MOTRIN) 400 MG tablet Take 400 mg by mouth 3 (three) times daily.    Marland Kitchen loratadine (CLARITIN) 10 MG tablet Take 1 tablet (10 mg total) by mouth daily. 30 tablet 11  . losartan (COZAAR) 100 MG tablet Take 1 tablet (100 mg total) by mouth daily. 90 tablet 3  . polyethylene glycol (MIRALAX / GLYCOLAX) packet Take 17 g by mouth 2 (two) times daily.    . vitamin B-12 (CYANOCOBALAMIN) 1000 MCG tablet Take 1,000 mcg by mouth daily.      Marland Kitchen zolpidem (AMBIEN CR) 12.5 MG CR tablet Take 1 tablet (12.5 mg total) by mouth at bedtime as needed for sleep. 30 tablet 5  . amoxicillin (AMOXIL) 500 MG capsule  TAKE 4 CAPSULES (2,000 MG TOTAL) BY MOUTH DAILY. FOR DENTAL VISITS. (Patient not taking: Reported on 03/03/2020) 20 capsule 3   No facility-administered medications prior to visit.    ROS: Review of Systems  Constitutional: Negative for activity change, appetite change, chills, fatigue and unexpected weight change.  HENT: Negative for congestion, mouth sores and sinus pressure.   Eyes: Negative for visual disturbance.  Respiratory: Negative for cough and chest tightness.   Gastrointestinal: Negative for abdominal pain and nausea.  Genitourinary: Negative for difficulty urinating, frequency and vaginal pain.  Musculoskeletal: Positive for back pain. Negative for gait problem.  Skin: Positive for wound. Negative for pallor and rash.  Neurological: Negative for dizziness, tremors, weakness, numbness and headaches.  Psychiatric/Behavioral: Positive for decreased concentration. Negative for confusion and sleep disturbance.    Objective:  BP (!) 162/76 (BP Location: Right Arm)   Pulse (!) 101   Temp 98.3 F (36.8 C) (Oral)   Wt 185 lb 9.6 oz (84.2 kg)   SpO2 97%   BMI 32.88 kg/m   BP Readings from Last 3 Encounters:  03/03/20 (!) 162/76  02/24/20 111/71  02/12/20 (!) 122/56    Wt Readings from Last 3 Encounters:  03/03/20 185 lb 9.6 oz (84.2 kg)  02/24/20 182 lb 15.7  oz (83 kg)  02/12/20 184 lb (83.5 kg)    Physical Exam Constitutional:      General: She is not in acute distress.    Appearance: She is well-developed.  HENT:     Head: Normocephalic.     Right Ear: External ear normal.     Left Ear: External ear normal.     Nose: Nose normal.  Eyes:     General:        Right eye: No discharge.        Left eye: No discharge.     Conjunctiva/sclera: Conjunctivae normal.     Pupils: Pupils are equal, round, and reactive to light.  Neck:     Thyroid: No thyromegaly.     Vascular: No JVD.     Trachea: No tracheal deviation.  Cardiovascular:     Rate and Rhythm: Normal  rate and regular rhythm.     Heart sounds: Normal heart sounds.  Pulmonary:     Effort: No respiratory distress.     Breath sounds: No stridor. No wheezing.  Abdominal:     General: Bowel sounds are normal. There is no distension.     Palpations: Abdomen is soft. There is no mass.     Tenderness: There is no abdominal tenderness. There is no guarding or rebound.  Musculoskeletal:        General: No tenderness.     Cervical back: Normal range of motion and neck supple.  Lymphadenopathy:     Cervical: No cervical adenopathy.  Skin:    Findings: No erythema or rash.  Neurological:     Cranial Nerves: No cranial nerve deficit.     Motor: No abnormal muscle tone.     Coordination: Coordination normal.     Deep Tendon Reflexes: Reflexes normal.  Psychiatric:        Behavior: Behavior normal.        Thought Content: Thought content normal.        Judgment: Judgment normal.   R occipital laceration  6 staples were removed  Lab Results  Component Value Date   WBC 7.8 02/04/2020   HGB 13.3 02/04/2020   HCT 39.7 02/04/2020   PLT 330 02/04/2020   GLUCOSE 100 (H) 02/04/2020   CHOL 221 (H) 10/20/2014   TRIG 140.0 10/20/2014   HDL 54.00 10/20/2014   LDLDIRECT 161.3 06/17/2011   LDLCALC 139 (H) 10/20/2014   ALT 19 02/04/2020   AST 17 02/04/2020   NA 137 02/04/2020   K 4.1 02/04/2020   CL 106 02/04/2020   CREATININE 0.91 02/04/2020   BUN 14 02/04/2020   CO2 22 02/04/2020   TSH 2.33 01/08/2019   INR 1.06 06/12/2018    CT Head Wo Contrast  Result Date: 02/24/2020 CLINICAL DATA:  Facial trauma. Neck pain, acute, no red flags. Additional history provided: Fall hitting back of head. EXAM: CT HEAD WITHOUT CONTRAST CT CERVICAL SPINE WITHOUT CONTRAST TECHNIQUE: Multidetector CT imaging of the head and cervical spine was performed following the standard protocol without intravenous contrast. Multiplanar CT image reconstructions of the cervical spine were also generated. COMPARISON:  CT  head/cervical spine 06/12/2018 FINDINGS: CT HEAD FINDINGS Brain: Stable, mild generalized parenchymal atrophy. There is no acute intracranial hemorrhage. No demarcated cortical infarct. No extra-axial fluid collection. No evidence of intracranial mass. No midline shift. Vascular: No hyperdense vessel. Skull: Normal. Negative for fracture or focal lesion. Sinuses/Orbits: Visualized orbits show no acute finding. No significant paranasal sinus disease or mastoid effusion at  the imaged levels. Other: Subtle right parietal scalp laceration. CT CERVICAL SPINE FINDINGS Alignment: Straightening of the expected cervical lordosis. Unchanged 2 mm C2-C3 grade 1 anterolisthesis. Skull base and vertebrae: The basion-dental and atlanto-dental intervals are maintained.No evidence of acute fracture to the cervical spine. Sequela of prior C4-C5 and C5-C6 ACDF. There is solid fusion across the disc space and posterior element at C4-C5. Unchanged, there is absence of fusion across the C5-C6 disc space. Soft tissues and spinal canal: No prevertebral fluid or swelling. No visible canal hematoma. Disc levels: Cervical spondylosis. Most notably at C3-C4 and C6-C7 there is advanced disc space narrowing with posterior disc osteophytes and uncovertebral hypertrophy. Severe disc space narrowing is also present at C7-T1 Upper chest: No visible pneumothorax. Patchy ground-glass opacity within the imaged lung apices. IMPRESSION: CT head: 1. No evidence of acute intracranial abnormality. 2. Right parietal scalp laceration. 3. Stable, mild generalized parenchymal atrophy. CT cervical spine: 1. No evidence of acute fracture to the cervical spine. 2. Prior C4-C6 ACDF. No evidence of hardware compromise. 3. Cervical spondylosis greatest at C3-C4 and C6-C7. 4. Patchy ground-glass opacity within the imaged lung apices which is nonspecific, but may reflect edema. Correlate clinically to exclude signs/symptoms of infection. Electronically Signed   By:  Kellie Simmering DO   On: 02/24/2020 16:46   CT Cervical Spine Wo Contrast  Result Date: 02/24/2020 CLINICAL DATA:  Facial trauma. Neck pain, acute, no red flags. Additional history provided: Fall hitting back of head. EXAM: CT HEAD WITHOUT CONTRAST CT CERVICAL SPINE WITHOUT CONTRAST TECHNIQUE: Multidetector CT imaging of the head and cervical spine was performed following the standard protocol without intravenous contrast. Multiplanar CT image reconstructions of the cervical spine were also generated. COMPARISON:  CT head/cervical spine 06/12/2018 FINDINGS: CT HEAD FINDINGS Brain: Stable, mild generalized parenchymal atrophy. There is no acute intracranial hemorrhage. No demarcated cortical infarct. No extra-axial fluid collection. No evidence of intracranial mass. No midline shift. Vascular: No hyperdense vessel. Skull: Normal. Negative for fracture or focal lesion. Sinuses/Orbits: Visualized orbits show no acute finding. No significant paranasal sinus disease or mastoid effusion at the imaged levels. Other: Subtle right parietal scalp laceration. CT CERVICAL SPINE FINDINGS Alignment: Straightening of the expected cervical lordosis. Unchanged 2 mm C2-C3 grade 1 anterolisthesis. Skull base and vertebrae: The basion-dental and atlanto-dental intervals are maintained.No evidence of acute fracture to the cervical spine. Sequela of prior C4-C5 and C5-C6 ACDF. There is solid fusion across the disc space and posterior element at C4-C5. Unchanged, there is absence of fusion across the C5-C6 disc space. Soft tissues and spinal canal: No prevertebral fluid or swelling. No visible canal hematoma. Disc levels: Cervical spondylosis. Most notably at C3-C4 and C6-C7 there is advanced disc space narrowing with posterior disc osteophytes and uncovertebral hypertrophy. Severe disc space narrowing is also present at C7-T1 Upper chest: No visible pneumothorax. Patchy ground-glass opacity within the imaged lung apices. IMPRESSION: CT  head: 1. No evidence of acute intracranial abnormality. 2. Right parietal scalp laceration. 3. Stable, mild generalized parenchymal atrophy. CT cervical spine: 1. No evidence of acute fracture to the cervical spine. 2. Prior C4-C6 ACDF. No evidence of hardware compromise. 3. Cervical spondylosis greatest at C3-C4 and C6-C7. 4. Patchy ground-glass opacity within the imaged lung apices which is nonspecific, but may reflect edema. Correlate clinically to exclude signs/symptoms of infection. Electronically Signed   By: Kellie Simmering DO   On: 02/24/2020 16:46    Assessment & Plan:   There are no diagnoses linked to  this encounter.   No orders of the defined types were placed in this encounter.    Follow-up: No follow-ups on file.  [name], MD

## 2020-03-03 NOTE — Assessment & Plan Note (Signed)
6 staples were removed

## 2020-03-03 NOTE — Assessment & Plan Note (Signed)
Will watch 

## 2020-04-29 ENCOUNTER — Other Ambulatory Visit: Payer: Self-pay

## 2020-04-29 ENCOUNTER — Ambulatory Visit (INDEPENDENT_AMBULATORY_CARE_PROVIDER_SITE_OTHER): Payer: Medicare Other

## 2020-04-29 DIAGNOSIS — Z23 Encounter for immunization: Secondary | ICD-10-CM

## 2020-05-20 ENCOUNTER — Encounter: Payer: Self-pay | Admitting: Internal Medicine

## 2020-05-20 ENCOUNTER — Other Ambulatory Visit: Payer: Self-pay

## 2020-05-20 ENCOUNTER — Ambulatory Visit (INDEPENDENT_AMBULATORY_CARE_PROVIDER_SITE_OTHER): Payer: Medicare Other | Admitting: Internal Medicine

## 2020-05-20 DIAGNOSIS — E538 Deficiency of other specified B group vitamins: Secondary | ICD-10-CM | POA: Diagnosis not present

## 2020-05-20 DIAGNOSIS — F411 Generalized anxiety disorder: Secondary | ICD-10-CM

## 2020-05-20 DIAGNOSIS — I10 Essential (primary) hypertension: Secondary | ICD-10-CM

## 2020-05-20 DIAGNOSIS — R413 Other amnesia: Secondary | ICD-10-CM

## 2020-05-20 DIAGNOSIS — E6609 Other obesity due to excess calories: Secondary | ICD-10-CM

## 2020-05-20 DIAGNOSIS — D518 Other vitamin B12 deficiency anemias: Secondary | ICD-10-CM

## 2020-05-20 DIAGNOSIS — E559 Vitamin D deficiency, unspecified: Secondary | ICD-10-CM | POA: Diagnosis not present

## 2020-05-20 DIAGNOSIS — Z6834 Body mass index (BMI) 34.0-34.9, adult: Secondary | ICD-10-CM

## 2020-05-20 DIAGNOSIS — G4700 Insomnia, unspecified: Secondary | ICD-10-CM

## 2020-05-20 DIAGNOSIS — F419 Anxiety disorder, unspecified: Secondary | ICD-10-CM

## 2020-05-20 MED ORDER — DIAZEPAM 5 MG PO TABS: ORAL_TABLET | ORAL | 3 refills | Status: DC

## 2020-05-20 NOTE — Assessment & Plan Note (Signed)
Azor 

## 2020-05-20 NOTE — Patient Instructions (Signed)
Take  L[place] Mushroom capsules for memory daily

## 2020-05-20 NOTE — Assessment & Plan Note (Signed)
On B12 

## 2020-05-20 NOTE — Assessment & Plan Note (Signed)
Zolpidem CR

## 2020-05-20 NOTE — Assessment & Plan Note (Signed)
Wellbutrin

## 2020-05-20 NOTE — Progress Notes (Signed)
Subjective:  Patient ID: [name], female    DOB: [birth year]  Age: 79 y.o. MRN: [id number]  CC: Follow-up   HPI Karenann Mcgrory presents for stress, isolation, insomnia, HTN f/u  Outpatient Medications Prior to Visit  Medication Sig Dispense Refill  . acetaminophen (TYLENOL) 500 MG tablet Take 1,000 mg by mouth every 6 (six) hours.    Marland Kitchen amLODipine (NORVASC) 5 MG tablet Take 1 tablet (5 mg total) by mouth daily. 90 tablet 3  . amoxicillin (AMOXIL) 500 MG capsule TAKE 4 CAPSULES (2,000 MG TOTAL) BY MOUTH DAILY. FOR DENTAL VISITS. 20 capsule 3  . anastrozole (ARIMIDEX) 1 MG tablet Take 1 tablet (1 mg total) by mouth daily. 90 tablet 4  . Ascorbic Acid (VITAMIN C PO) Take by mouth daily.    Marland Kitchen b complex vitamins tablet Take 1 tablet by mouth daily. 100 tablet 3  . buPROPion (WELLBUTRIN XL) 150 MG 24 hr tablet TAKE 1 TABLET BY MOUTH  DAILY 90 tablet 3  . Cholecalciferol (VITAMIN D3) 50 MCG (2000 UT) capsule Take 1 capsule (2,000 Units total) by mouth daily. 100 capsule 3  . diazepam (VALIUM) 5 MG tablet TAKE 1 TABLET BY MOUTH EVERY 12 HOURS AS NEEDED FOR ANXIETY OR MUSCLE SPASMS. 30 tablet 3  . HYDROcodone-acetaminophen (NORCO/VICODIN) 5-325 MG tablet Take 1 tablet by mouth 2 (two) times daily as needed for severe pain. 60 tablet 0  . ibuprofen (ADVIL,MOTRIN) 400 MG tablet Take 400 mg by mouth 3 (three) times daily.    Marland Kitchen loratadine (CLARITIN) 10 MG tablet Take 1 tablet (10 mg total) by mouth daily. 30 tablet 11  . losartan (COZAAR) 100 MG tablet Take 1 tablet (100 mg total) by mouth daily. 90 tablet 3  . polyethylene glycol (MIRALAX / GLYCOLAX) packet Take 17 g by mouth 2 (two) times daily.    . vitamin B-12 (CYANOCOBALAMIN) 1000 MCG tablet Take 1,000 mcg by mouth daily.      Marland Kitchen zolpidem (AMBIEN CR) 12.5 MG CR tablet Take 1 tablet (12.5 mg total) by mouth at bedtime as needed for sleep. 30 tablet 5  . fexofenadine (ALLEGRA ALLERGY) 180 MG tablet Take 180 mg by mouth  daily. (Patient not taking: Reported on 05/20/2020)     No facility-administered medications prior to visit.    ROS: Review of Systems  Constitutional: Positive for unexpected weight change. Negative for activity change, appetite change, chills and fatigue.  HENT: Negative for congestion, mouth sores and sinus pressure.   Eyes: Negative for visual disturbance.  Respiratory: Negative for cough and chest tightness.   Gastrointestinal: Negative for abdominal pain and nausea.  Genitourinary: Negative for difficulty urinating, frequency and vaginal pain.  Musculoskeletal: Positive for back pain. Negative for gait problem.  Skin: Negative for pallor and rash.  Neurological: Negative for dizziness, tremors, weakness, numbness and headaches.  Psychiatric/Behavioral: Positive for dysphoric mood and sleep disturbance. Negative for confusion. The patient is nervous/anxious.     Objective:  BP 136/70 (BP Location: Right Arm, Patient Position: Sitting, Cuff Size: Large)   Pulse 94   Temp 98.4 F (36.9 C) (Oral)   Ht 5\' 3"  (1.6 m)   Wt 183 lb 6.4 oz (83.2 kg)   SpO2 98%   BMI 32.49 kg/m   BP Readings from Last 3 Encounters:  05/20/20 136/70  03/03/20 (!) 162/76  02/24/20 111/71    Wt Readings from Last 3 Encounters:  05/20/20 183 lb 6.4 oz (83.2 kg)  03/03/20 185 lb 9.6 oz (  84.2 kg)  02/24/20 182 lb 15.7 oz (83 kg)    Physical Exam Constitutional:      General: She is not in acute distress.    Appearance: She is well-developed. She is obese.  HENT:     Head: Normocephalic.     Right Ear: External ear normal.     Left Ear: External ear normal.     Nose: Nose normal.  Eyes:     General:        Right eye: No discharge.        Left eye: No discharge.     Conjunctiva/sclera: Conjunctivae normal.     Pupils: Pupils are equal, round, and reactive to light.  Neck:     Thyroid: No thyromegaly.     Vascular: No JVD.     Trachea: No tracheal deviation.  Cardiovascular:     Rate  and Rhythm: Normal rate and regular rhythm.     Heart sounds: Normal heart sounds.  Pulmonary:     Effort: No respiratory distress.     Breath sounds: No stridor. No wheezing.  Abdominal:     General: Bowel sounds are normal. There is no distension.     Palpations: Abdomen is soft. There is no mass.     Tenderness: There is no abdominal tenderness. There is no guarding or rebound.  Musculoskeletal:        General: No tenderness.     Cervical back: Normal range of motion and neck supple.  Lymphadenopathy:     Cervical: No cervical adenopathy.  Skin:    Findings: No erythema or rash.  Neurological:     Mental Status: She is oriented to person, place, and time.     Cranial Nerves: No cranial nerve deficit.     Motor: No abnormal muscle tone.     Coordination: Coordination normal.     Deep Tendon Reflexes: Reflexes normal.  Psychiatric:        Behavior: Behavior normal.        Thought Content: Thought content normal.        Judgment: Judgment normal.     Lab Results  Component Value Date   WBC 7.8 02/04/2020   HGB 13.3 02/04/2020   HCT 39.7 02/04/2020   PLT 330 02/04/2020   GLUCOSE 100 (H) 02/04/2020   CHOL 221 (H) 10/20/2014   TRIG 140.0 10/20/2014   HDL 54.00 10/20/2014   LDLDIRECT 161.3 06/17/2011   LDLCALC 139 (H) 10/20/2014   ALT 19 02/04/2020   AST 17 02/04/2020   NA 137 02/04/2020   K 4.1 02/04/2020   CL 106 02/04/2020   CREATININE 0.91 02/04/2020   BUN 14 02/04/2020   CO2 22 02/04/2020   TSH 2.33 01/08/2019   INR 1.06 06/12/2018    CT Head Wo Contrast  Result Date: 02/24/2020 CLINICAL DATA:  Facial trauma. Neck pain, acute, no red flags. Additional history provided: Fall hitting back of head. EXAM: CT HEAD WITHOUT CONTRAST CT CERVICAL SPINE WITHOUT CONTRAST TECHNIQUE: Multidetector CT imaging of the head and cervical spine was performed following the standard protocol without intravenous contrast. Multiplanar CT image reconstructions of the cervical spine  were also generated. COMPARISON:  CT head/cervical spine 06/12/2018 FINDINGS: CT HEAD FINDINGS Brain: Stable, mild generalized parenchymal atrophy. There is no acute intracranial hemorrhage. No demarcated cortical infarct. No extra-axial fluid collection. No evidence of intracranial mass. No midline shift. Vascular: No hyperdense vessel. Skull: Normal. Negative for fracture or focal lesion. Sinuses/Orbits: Visualized orbits show no  acute finding. No significant paranasal sinus disease or mastoid effusion at the imaged levels. Other: Subtle right parietal scalp laceration. CT CERVICAL SPINE FINDINGS Alignment: Straightening of the expected cervical lordosis. Unchanged 2 mm C2-C3 grade 1 anterolisthesis. Skull base and vertebrae: The basion-dental and atlanto-dental intervals are maintained.No evidence of acute fracture to the cervical spine. Sequela of prior C4-C5 and C5-C6 ACDF. There is solid fusion across the disc space and posterior element at C4-C5. Unchanged, there is absence of fusion across the C5-C6 disc space. Soft tissues and spinal canal: No prevertebral fluid or swelling. No visible canal hematoma. Disc levels: Cervical spondylosis. Most notably at C3-C4 and C6-C7 there is advanced disc space narrowing with posterior disc osteophytes and uncovertebral hypertrophy. Severe disc space narrowing is also present at C7-T1 Upper chest: No visible pneumothorax. Patchy ground-glass opacity within the imaged lung apices. IMPRESSION: CT head: 1. No evidence of acute intracranial abnormality. 2. Right parietal scalp laceration. 3. Stable, mild generalized parenchymal atrophy. CT cervical spine: 1. No evidence of acute fracture to the cervical spine. 2. Prior C4-C6 ACDF. No evidence of hardware compromise. 3. Cervical spondylosis greatest at C3-C4 and C6-C7. 4. Patchy ground-glass opacity within the imaged lung apices which is nonspecific, but may reflect edema. Correlate clinically to exclude signs/symptoms of  infection. Electronically Signed   By: Kellie Simmering DO   On: 02/24/2020 16:46   CT Cervical Spine Wo Contrast  Result Date: 02/24/2020 CLINICAL DATA:  Facial trauma. Neck pain, acute, no red flags. Additional history provided: Fall hitting back of head. EXAM: CT HEAD WITHOUT CONTRAST CT CERVICAL SPINE WITHOUT CONTRAST TECHNIQUE: Multidetector CT imaging of the head and cervical spine was performed following the standard protocol without intravenous contrast. Multiplanar CT image reconstructions of the cervical spine were also generated. COMPARISON:  CT head/cervical spine 06/12/2018 FINDINGS: CT HEAD FINDINGS Brain: Stable, mild generalized parenchymal atrophy. There is no acute intracranial hemorrhage. No demarcated cortical infarct. No extra-axial fluid collection. No evidence of intracranial mass. No midline shift. Vascular: No hyperdense vessel. Skull: Normal. Negative for fracture or focal lesion. Sinuses/Orbits: Visualized orbits show no acute finding. No significant paranasal sinus disease or mastoid effusion at the imaged levels. Other: Subtle right parietal scalp laceration. CT CERVICAL SPINE FINDINGS Alignment: Straightening of the expected cervical lordosis. Unchanged 2 mm C2-C3 grade 1 anterolisthesis. Skull base and vertebrae: The basion-dental and atlanto-dental intervals are maintained.No evidence of acute fracture to the cervical spine. Sequela of prior C4-C5 and C5-C6 ACDF. There is solid fusion across the disc space and posterior element at C4-C5. Unchanged, there is absence of fusion across the C5-C6 disc space. Soft tissues and spinal canal: No prevertebral fluid or swelling. No visible canal hematoma. Disc levels: Cervical spondylosis. Most notably at C3-C4 and C6-C7 there is advanced disc space narrowing with posterior disc osteophytes and uncovertebral hypertrophy. Severe disc space narrowing is also present at C7-T1 Upper chest: No visible pneumothorax. Patchy ground-glass opacity within  the imaged lung apices. IMPRESSION: CT head: 1. No evidence of acute intracranial abnormality. 2. Right parietal scalp laceration. 3. Stable, mild generalized parenchymal atrophy. CT cervical spine: 1. No evidence of acute fracture to the cervical spine. 2. Prior C4-C6 ACDF. No evidence of hardware compromise. 3. Cervical spondylosis greatest at C3-C4 and C6-C7. 4. Patchy ground-glass opacity within the imaged lung apices which is nonspecific, but may reflect edema. Correlate clinically to exclude signs/symptoms of infection. Electronically Signed   By: Kellie Simmering DO   On: 02/24/2020 16:46  Assessment & Plan:    [name], MD

## 2020-05-20 NOTE — Assessment & Plan Note (Signed)
Risks associated with treatment noncompliance were discussed. Compliance was encouraged. Vit D 2000 iu/d

## 2020-05-20 NOTE — Assessment & Plan Note (Signed)
Chronic Risks associated with treatment noncompliance were discussed. Compliance was encouraged.

## 2020-05-20 NOTE — Assessment & Plan Note (Signed)
Wt Readings from Last 3 Encounters:  05/20/20 183 lb 6.4 oz (83.2 kg)  03/03/20 185 lb 9.6 oz (84.2 kg)  02/24/20 182 lb 15.7 oz (83 kg)

## 2020-05-20 NOTE — Assessment & Plan Note (Signed)
try [place] Mushroom extract or capsules for memory 

## 2020-06-22 ENCOUNTER — Other Ambulatory Visit: Payer: Self-pay

## 2020-06-22 ENCOUNTER — Ambulatory Visit (INDEPENDENT_AMBULATORY_CARE_PROVIDER_SITE_OTHER): Payer: Medicare Other | Admitting: Obstetrics and Gynecology

## 2020-06-22 ENCOUNTER — Encounter: Payer: Self-pay | Admitting: Obstetrics and Gynecology

## 2020-06-22 VITALS — BP 118/74 | Ht 63.0 in | Wt 186.0 lb

## 2020-06-22 DIAGNOSIS — Z853 Personal history of malignant neoplasm of breast: Secondary | ICD-10-CM

## 2020-06-22 DIAGNOSIS — Z01419 Encounter for gynecological examination (general) (routine) without abnormal findings: Secondary | ICD-10-CM

## 2020-06-22 DIAGNOSIS — Z9289 Personal history of other medical treatment: Secondary | ICD-10-CM | POA: Diagnosis not present

## 2020-06-22 DIAGNOSIS — M8580 Other specified disorders of bone density and structure, unspecified site: Secondary | ICD-10-CM

## 2020-06-22 NOTE — Progress Notes (Signed)
[name] April 18, 1941 [id number]  SUBJECTIVE:  79 y.o. G2P1011 female here for a breast and pelvic exam. She has no gynecologic concerns.  Current Outpatient Medications  Medication Sig Dispense Refill  . acetaminophen (TYLENOL) 500 MG tablet Take 1,000 mg by mouth every 6 (six) hours.    Marland Kitchen amLODipine (NORVASC) 5 MG tablet Take 1 tablet (5 mg total) by mouth daily. 90 tablet 3  . anastrozole (ARIMIDEX) 1 MG tablet Take 1 tablet (1 mg total) by mouth daily. 90 tablet 4  . Ascorbic Acid (VITAMIN C PO) Take by mouth daily.    Marland Kitchen b complex vitamins tablet Take 1 tablet by mouth daily. 100 tablet 3  . buPROPion (WELLBUTRIN XL) 150 MG 24 hr tablet TAKE 1 TABLET BY MOUTH  DAILY 90 tablet 3  . Cholecalciferol (VITAMIN D3) 50 MCG (2000 UT) capsule Take 1 capsule (2,000 Units total) by mouth daily. 100 capsule 3  . diazepam (VALIUM) 5 MG tablet TAKE 1 TABLET BY MOUTH EVERY 12 HOURS AS NEEDED FOR ANXIETY OR MUSCLE SPASMS. 30 tablet 3  . fexofenadine (ALLEGRA ALLERGY) 180 MG tablet Take 180 mg by mouth daily.     Marland Kitchen HYDROcodone-acetaminophen (NORCO/VICODIN) 5-325 MG tablet Take 1 tablet by mouth 2 (two) times daily as needed for severe pain. 60 tablet 0  . ibuprofen (ADVIL,MOTRIN) 400 MG tablet Take 400 mg by mouth 3 (three) times daily.    Marland Kitchen loratadine (CLARITIN) 10 MG tablet Take 1 tablet (10 mg total) by mouth daily. 30 tablet 11  . losartan (COZAAR) 100 MG tablet Take 1 tablet (100 mg total) by mouth daily. 90 tablet 3  . polyethylene glycol (MIRALAX / GLYCOLAX) packet Take 17 g by mouth 2 (two) times daily.    Marland Kitchen zolpidem (AMBIEN CR) 12.5 MG CR tablet Take 1 tablet (12.5 mg total) by mouth at bedtime as needed for sleep. 30 tablet 5  . amoxicillin (AMOXIL) 500 MG capsule TAKE 4 CAPSULES (2,000 MG TOTAL) BY MOUTH DAILY. FOR DENTAL VISITS. (Patient not taking: Reported on 06/22/2020) 20 capsule 3   No current facility-administered medications for this visit.   Allergies: Effexor xr  [venlafaxine hcl er], Augmentin [amoxicillin-pot clavulanate], Cymbalta [duloxetine hcl], Ezetimibe-simvastatin, and Phentermine hcl  No LMP recorded. Patient has had a hysterectomy.  Past medical history,surgical history, problem list, medications, allergies, family history and social history were all reviewed and documented as reviewed in the EPIC chart.  GYN ROS: no abnormal bleeding, pelvic pain or discharge, no breast pain or new or enlarging lumps on self exam.  No dysuria, frequency, burning, pain with urination, cloudy/malodorous urine.   OBJECTIVE:  BP 118/74   Ht 5\' 3"  (1.6 m)   Wt 186 lb (84.4 kg)   BMI 32.95 kg/m  The patient appears well, alert, oriented, in no distress.  BREAST EXAM: breasts appear normal, no suspicious masses, no skin or nipple changes or axillary nodes, bilateral reduction scars and well-healed left lumpectomy scar  PELVIC EXAM: VULVA: normal appearing vulva with atrophic change, no masses, tenderness or lesions, VAGINA: normal appearing vagina with atrophic change, normal color and discharge, no lesions, CERVIX: Atrophic changes but otherwise normal appearance, UTERUS: surgically absent, ADNEXA: no masses, nontender  Chaperone: Caryn Bee present during the examination  ASSESSMENT:  79 y.o. G2P1011 here for a breast and pelvic exam  PLAN:   1. Postmenopausal. Prior supracervical hysterectomy.  No significant menopausal symptoms.  No vaginal bleeding. 2. Pap smear 06/2016.  No significant history of abnormal Pap  smears.  She is comfortable with not continuing screening after previous discussions following the current guidelines based on age criteria. 3. History of lichen sclerosis.  No significant symptoms and not using topical treatment at this time. 4. History of left breast cancer.  Mammogram 01/2020.  Normal breast exam today/NED.  Actively follows with oncology. 5. Colonoscopy 2019.  She will follow up at the interval recommended by her GI  specialist.  6. DEXA 05/2019 obtained through Dr. Virgie Dad office showed BMD was normal.  She will continue to follow-up with him in reference to bone health. 7. Health maintenance.  No labs today as she normally has these completed elsewhere.  Return annually or sooner, prn.  Joseph Pierini MD 06/22/20

## 2020-07-29 ENCOUNTER — Other Ambulatory Visit: Payer: Self-pay | Admitting: Internal Medicine

## 2020-08-19 ENCOUNTER — Other Ambulatory Visit: Payer: Self-pay

## 2020-08-20 ENCOUNTER — Ambulatory Visit: Payer: Medicare Other | Admitting: Internal Medicine

## 2020-08-26 ENCOUNTER — Ambulatory Visit: Payer: Medicare Other

## 2020-08-27 ENCOUNTER — Ambulatory Visit (INDEPENDENT_AMBULATORY_CARE_PROVIDER_SITE_OTHER): Payer: Medicare Other | Admitting: Internal Medicine

## 2020-08-27 ENCOUNTER — Other Ambulatory Visit: Payer: Self-pay

## 2020-08-27 ENCOUNTER — Encounter: Payer: Self-pay | Admitting: Internal Medicine

## 2020-08-27 DIAGNOSIS — I10 Essential (primary) hypertension: Secondary | ICD-10-CM | POA: Diagnosis not present

## 2020-08-27 DIAGNOSIS — R252 Cramp and spasm: Secondary | ICD-10-CM

## 2020-08-27 DIAGNOSIS — Z6834 Body mass index (BMI) 34.0-34.9, adult: Secondary | ICD-10-CM

## 2020-08-27 DIAGNOSIS — M5440 Lumbago with sciatica, unspecified side: Secondary | ICD-10-CM

## 2020-08-27 DIAGNOSIS — H9313 Tinnitus, bilateral: Secondary | ICD-10-CM

## 2020-08-27 DIAGNOSIS — F413 Other mixed anxiety disorders: Secondary | ICD-10-CM

## 2020-08-27 DIAGNOSIS — G8929 Other chronic pain: Secondary | ICD-10-CM

## 2020-08-27 DIAGNOSIS — R413 Other amnesia: Secondary | ICD-10-CM

## 2020-08-27 DIAGNOSIS — E6609 Other obesity due to excess calories: Secondary | ICD-10-CM

## 2020-08-27 NOTE — Patient Instructions (Signed)
Chair yoga on zoom

## 2020-08-27 NOTE — Progress Notes (Signed)
Subjective:  Patient ID: [name], female    DOB: October 03, 1940  Age: 80 y.o. MRN: [id number]  CC: Follow-up (3 MONTH F/U)   HPI [name] presents for hissing noise in the head  x long time, hearing loss Question about a probiotic -use periodically  C/o R hand numbness and tingling at times, cramps.  She has been using her laptop and iPad a lot    Outpatient Medications Prior to Visit  Medication Sig Dispense Refill  . acetaminophen (TYLENOL) 500 MG tablet Take 1,000 mg by mouth every 6 (six) hours.    Marland Kitchen amLODipine (NORVASC) 5 MG tablet Take 1 tablet (5 mg total) by mouth daily. 90 tablet 3  . amoxicillin (AMOXIL) 500 MG capsule TAKE 4 CAPSULES (2,000 MG TOTAL) BY MOUTH DAILY. FOR DENTAL VISITS. 20 capsule 3  . anastrozole (ARIMIDEX) 1 MG tablet Take 1 tablet (1 mg total) by mouth daily. 90 tablet 4  . Ascorbic Acid (VITAMIN C PO) Take by mouth daily.    Marland Kitchen b complex vitamins tablet Take 1 tablet by mouth daily. 100 tablet 3  . buPROPion (WELLBUTRIN XL) 150 MG 24 hr tablet TAKE 1 TABLET BY MOUTH  DAILY 90 tablet 3  . Cholecalciferol (VITAMIN D3) 50 MCG (2000 UT) capsule Take 1 capsule (2,000 Units total) by mouth daily. 100 capsule 3  . diazepam (VALIUM) 5 MG tablet TAKE 1 TABLET BY MOUTH EVERY 12 HOURS AS NEEDED FOR ANXIETY OR MUSCLE SPASMS. 30 tablet 3  . fexofenadine (ALLEGRA) 180 MG tablet Take 180 mg by mouth daily.     Marland Kitchen HYDROcodone-acetaminophen (NORCO/VICODIN) 5-325 MG tablet Take 1 tablet by mouth 2 (two) times daily as needed for severe pain. 60 tablet 0  . ibuprofen (ADVIL,MOTRIN) 400 MG tablet Take 400 mg by mouth 3 (three) times daily.    Marland Kitchen loratadine (CLARITIN) 10 MG tablet Take 1 tablet (10 mg total) by mouth daily. 30 tablet 11  . losartan (COZAAR) 100 MG tablet Take 1 tablet (100 mg total) by mouth daily. 90 tablet 3  . polyethylene glycol (MIRALAX / GLYCOLAX) packet Take 17 g by mouth 2 (two) times daily.    Marland Kitchen zolpidem (AMBIEN CR) 12.5  MG CR tablet TAKE 1 TABLET BY MOUTH AT BEDTIME AS NEEDED FOR SLEEP 30 tablet 3   No facility-administered medications prior to visit.    ROS: Review of Systems  Constitutional: Positive for unexpected weight change. Negative for activity change, appetite change, chills and fatigue.  HENT: Negative for congestion, mouth sores and sinus pressure.   Eyes: Negative for visual disturbance.  Respiratory: Negative for cough and chest tightness.   Gastrointestinal: Negative for abdominal pain and nausea.  Genitourinary: Negative for difficulty urinating, frequency and vaginal pain.  Musculoskeletal: Positive for arthralgias and back pain. Negative for gait problem.  Skin: Negative for pallor and rash.  Neurological: Negative for dizziness, tremors, weakness, numbness and headaches.  Psychiatric/Behavioral: Positive for sleep disturbance. Negative for confusion and suicidal ideas. The patient is nervous/anxious.     Objective:  BP 138/70 (BP Location: Left Arm)   Pulse (!) 102   Temp 97.9 F (36.6 C) (Oral)   Wt 186 lb 6.4 oz (84.6 kg)   SpO2 99%   BMI 33.02 kg/m   BP Readings from Last 3 Encounters:  08/27/20 138/70  06/22/20 118/74  05/20/20 136/70    Wt Readings from Last 3 Encounters:  08/27/20 186 lb 6.4 oz (84.6 kg)  06/22/20 186 lb (84.4 kg)  05/20/20 183 lb 6.4 oz (83.2 kg)    Physical Exam Constitutional:      General: She is not in acute distress.    Appearance: She is well-developed. She is obese.  HENT:     Head: Normocephalic.     Right Ear: External ear normal.     Left Ear: External ear normal.     Nose: Nose normal.     Mouth/Throat:     Mouth: Oropharynx is clear and moist.  Eyes:     General:        Right eye: No discharge.        Left eye: No discharge.     Conjunctiva/sclera: Conjunctivae normal.     Pupils: Pupils are equal, round, and reactive to light.  Neck:     Thyroid: No thyromegaly.     Vascular: No JVD.     Trachea: No tracheal  deviation.  Cardiovascular:     Rate and Rhythm: Normal rate and regular rhythm.     Heart sounds: Normal heart sounds.  Pulmonary:     Effort: No respiratory distress.     Breath sounds: No stridor. No wheezing.  Abdominal:     General: Bowel sounds are normal. There is no distension.     Palpations: Abdomen is soft. There is no mass.     Tenderness: There is no abdominal tenderness. There is no guarding or rebound.  Musculoskeletal:        General: No edema.     Cervical back: Normal range of motion and neck supple.  Lymphadenopathy:     Cervical: No cervical adenopathy.  Skin:    Findings: No erythema or rash.  Neurological:     Cranial Nerves: No cranial nerve deficit.     Motor: No abnormal muscle tone.     Coordination: Coordination normal.     Deep Tendon Reflexes: Reflexes normal.  Psychiatric:        Mood and Affect: Mood and affect normal.        Behavior: Behavior normal.        Thought Content: Thought content normal.        Judgment: Judgment normal.    Both ears are normal And without muscle atrophy, good grip.  Arthritic changes.  No signs of CTS Lab Results  Component Value Date   WBC 7.8 02/04/2020   HGB 13.3 02/04/2020   HCT 39.7 02/04/2020   PLT 330 02/04/2020   GLUCOSE 100 (H) 02/04/2020   CHOL 221 (H) 10/20/2014   TRIG 140.0 10/20/2014   HDL 54.00 10/20/2014   LDLDIRECT 161.3 06/17/2011   LDLCALC 139 (H) 10/20/2014   ALT 19 02/04/2020   AST 17 02/04/2020   NA 137 02/04/2020   K 4.1 02/04/2020   CL 106 02/04/2020   CREATININE 0.91 02/04/2020   BUN 14 02/04/2020   CO2 22 02/04/2020   TSH 2.33 01/08/2019   INR 1.06 06/12/2018    CT Head Wo Contrast  Result Date: 02/24/2020 CLINICAL DATA:  Facial trauma. Neck pain, acute, no red flags. Additional history provided: Fall hitting back of head. EXAM: CT HEAD WITHOUT CONTRAST CT CERVICAL SPINE WITHOUT CONTRAST TECHNIQUE: Multidetector CT imaging of the head and cervical spine was performed  following the standard protocol without intravenous contrast. Multiplanar CT image reconstructions of the cervical spine were also generated. COMPARISON:  CT head/cervical spine 06/12/2018 FINDINGS: CT HEAD FINDINGS Brain: Stable, mild generalized parenchymal atrophy. There is no acute intracranial hemorrhage. No demarcated cortical infarct.  No extra-axial fluid collection. No evidence of intracranial mass. No midline shift. Vascular: No hyperdense vessel. Skull: Normal. Negative for fracture or focal lesion. Sinuses/Orbits: Visualized orbits show no acute finding. No significant paranasal sinus disease or mastoid effusion at the imaged levels. Other: Subtle right parietal scalp laceration. CT CERVICAL SPINE FINDINGS Alignment: Straightening of the expected cervical lordosis. Unchanged 2 mm C2-C3 grade 1 anterolisthesis. Skull base and vertebrae: The basion-dental and atlanto-dental intervals are maintained.No evidence of acute fracture to the cervical spine. Sequela of prior C4-C5 and C5-C6 ACDF. There is solid fusion across the disc space and posterior element at C4-C5. Unchanged, there is absence of fusion across the C5-C6 disc space. Soft tissues and spinal canal: No prevertebral fluid or swelling. No visible canal hematoma. Disc levels: Cervical spondylosis. Most notably at C3-C4 and C6-C7 there is advanced disc space narrowing with posterior disc osteophytes and uncovertebral hypertrophy. Severe disc space narrowing is also present at C7-T1 Upper chest: No visible pneumothorax. Patchy ground-glass opacity within the imaged lung apices. IMPRESSION: CT head: 1. No evidence of acute intracranial abnormality. 2. Right parietal scalp laceration. 3. Stable, mild generalized parenchymal atrophy. CT cervical spine: 1. No evidence of acute fracture to the cervical spine. 2. Prior C4-C6 ACDF. No evidence of hardware compromise. 3. Cervical spondylosis greatest at C3-C4 and C6-C7. 4. Patchy ground-glass opacity within  the imaged lung apices which is nonspecific, but may reflect edema. Correlate clinically to exclude signs/symptoms of infection. Electronically Signed   By: Kellie Simmering DO   On: 02/24/2020 16:46   CT Cervical Spine Wo Contrast  Result Date: 02/24/2020 CLINICAL DATA:  Facial trauma. Neck pain, acute, no red flags. Additional history provided: Fall hitting back of head. EXAM: CT HEAD WITHOUT CONTRAST CT CERVICAL SPINE WITHOUT CONTRAST TECHNIQUE: Multidetector CT imaging of the head and cervical spine was performed following the standard protocol without intravenous contrast. Multiplanar CT image reconstructions of the cervical spine were also generated. COMPARISON:  CT head/cervical spine 06/12/2018 FINDINGS: CT HEAD FINDINGS Brain: Stable, mild generalized parenchymal atrophy. There is no acute intracranial hemorrhage. No demarcated cortical infarct. No extra-axial fluid collection. No evidence of intracranial mass. No midline shift. Vascular: No hyperdense vessel. Skull: Normal. Negative for fracture or focal lesion. Sinuses/Orbits: Visualized orbits show no acute finding. No significant paranasal sinus disease or mastoid effusion at the imaged levels. Other: Subtle right parietal scalp laceration. CT CERVICAL SPINE FINDINGS Alignment: Straightening of the expected cervical lordosis. Unchanged 2 mm C2-C3 grade 1 anterolisthesis. Skull base and vertebrae: The basion-dental and atlanto-dental intervals are maintained.No evidence of acute fracture to the cervical spine. Sequela of prior C4-C5 and C5-C6 ACDF. There is solid fusion across the disc space and posterior element at C4-C5. Unchanged, there is absence of fusion across the C5-C6 disc space. Soft tissues and spinal canal: No prevertebral fluid or swelling. No visible canal hematoma. Disc levels: Cervical spondylosis. Most notably at C3-C4 and C6-C7 there is advanced disc space narrowing with posterior disc osteophytes and uncovertebral hypertrophy. Severe  disc space narrowing is also present at C7-T1 Upper chest: No visible pneumothorax. Patchy ground-glass opacity within the imaged lung apices. IMPRESSION: CT head: 1. No evidence of acute intracranial abnormality. 2. Right parietal scalp laceration. 3. Stable, mild generalized parenchymal atrophy. CT cervical spine: 1. No evidence of acute fracture to the cervical spine. 2. Prior C4-C6 ACDF. No evidence of hardware compromise. 3. Cervical spondylosis greatest at C3-C4 and C6-C7. 4. Patchy ground-glass opacity within the imaged lung apices which is  nonspecific, but may reflect edema. Correlate clinically to exclude signs/symptoms of infection. Electronically Signed   By: Kellie Simmering DO   On: 02/24/2020 16:46    Assessment & Plan:    [name], MD

## 2020-08-31 DIAGNOSIS — H9319 Tinnitus, unspecified ear: Secondary | ICD-10-CM | POA: Insufficient documentation

## 2020-08-31 DIAGNOSIS — R252 Cramp and spasm: Secondary | ICD-10-CM | POA: Insufficient documentation

## 2020-08-31 NOTE — Assessment & Plan Note (Signed)
Chronic, situational.  Continue with  Wellbutrin.  Diazepam as needed-rare use.  Potential benefits of a long term benzodiazepines  use as well as potential risks  and complications were explained to the patient and were aknowledged. 

## 2020-08-31 NOTE — Assessment & Plan Note (Signed)
Doing fair 

## 2020-08-31 NOTE — Assessment & Plan Note (Signed)
The patient was suggested to have a hearing test.

## 2020-08-31 NOTE — Assessment & Plan Note (Addendum)
Continue current therapy with losartan and amlodipine

## 2020-08-31 NOTE — Assessment & Plan Note (Signed)
right hand-most likely due to overuse She needs to have never been on the laptop computer/tablet set up

## 2020-08-31 NOTE — Assessment & Plan Note (Signed)
Wt Readings from Last 3 Encounters:  08/27/20 186 lb 6.4 oz (84.6 kg)  06/22/20 186 lb (84.4 kg)  05/20/20 183 lb 6.4 oz (83.2 kg)  She is trying to build a better diet, more active

## 2020-08-31 NOTE — Assessment & Plan Note (Signed)
Norco prn  Potential benefits of a long term opioids use as well as potential risks (i.e. addiction risk, apnea etc) and complications (i.e. Somnolence, constipation and others) were explained to the patient and were aknowledged. Try to exercise

## 2020-11-26 ENCOUNTER — Other Ambulatory Visit: Payer: Self-pay

## 2020-11-26 ENCOUNTER — Ambulatory Visit (INDEPENDENT_AMBULATORY_CARE_PROVIDER_SITE_OTHER): Payer: Medicare Other | Admitting: Internal Medicine

## 2020-11-26 ENCOUNTER — Encounter: Payer: Self-pay | Admitting: Internal Medicine

## 2020-11-26 DIAGNOSIS — R413 Other amnesia: Secondary | ICD-10-CM | POA: Diagnosis not present

## 2020-11-26 DIAGNOSIS — I10 Essential (primary) hypertension: Secondary | ICD-10-CM | POA: Diagnosis not present

## 2020-11-26 DIAGNOSIS — G4700 Insomnia, unspecified: Secondary | ICD-10-CM

## 2020-11-26 DIAGNOSIS — E538 Deficiency of other specified B group vitamins: Secondary | ICD-10-CM

## 2020-11-26 DIAGNOSIS — F4323 Adjustment disorder with mixed anxiety and depressed mood: Secondary | ICD-10-CM

## 2020-11-26 DIAGNOSIS — I700 Atherosclerosis of aorta: Secondary | ICD-10-CM

## 2020-11-26 DIAGNOSIS — R55 Syncope and collapse: Secondary | ICD-10-CM

## 2020-11-26 MED ORDER — ZOLPIDEM TARTRATE ER 6.25 MG PO TBCR: 6.2500 mg | EXTENDED_RELEASE_TABLET | Freq: Every evening | ORAL | 1 refills | Status: DC | PRN

## 2020-11-26 NOTE — Assessment & Plan Note (Addendum)
?  vaso-vagal Eat before going out Check CBC, CMET

## 2020-11-26 NOTE — Assessment & Plan Note (Signed)
On B12 

## 2020-11-26 NOTE — Assessment & Plan Note (Signed)
Reduce Zolpidem CR to 6.25 mg prn

## 2020-11-26 NOTE — Assessment & Plan Note (Signed)
Cont w/ losartan and amlodipine

## 2020-11-26 NOTE — Progress Notes (Signed)
Subjective:  Patient ID: [name], female    DOB: [birth year]  Age: 80 y.o. MRN: [id number]  CC: Follow-up (3 MONTH F/U- want to discuss her zolpidem )   HPI [name] presents for neck pain, LBP, OA - worse. She was shopping at [facility] yesterday - pt felt jittery, weak, in a lot of pain - almost passed out. She did not eat prior.  Outpatient Medications Prior to Visit  Medication Sig Dispense Refill  . acetaminophen (TYLENOL) 500 MG tablet Take 1,000 mg by mouth every 6 (six) hours as needed.    Marland Kitchen amLODipine (NORVASC) 5 MG tablet Take 1 tablet (5 mg total) by mouth daily. 90 tablet 3  . amoxicillin (AMOXIL) 500 MG capsule TAKE 4 CAPSULES (2,000 MG TOTAL) BY MOUTH DAILY. FOR DENTAL VISITS. 20 capsule 3  . anastrozole (ARIMIDEX) 1 MG tablet Take 1 tablet (1 mg total) by mouth daily. 90 tablet 4  . Ascorbic Acid (VITAMIN C PO) Take by mouth daily.    Marland Kitchen b complex vitamins tablet Take 1 tablet by mouth daily. 100 tablet 3  . buPROPion (WELLBUTRIN XL) 150 MG 24 hr tablet TAKE 1 TABLET BY MOUTH  DAILY 90 tablet 3  . Cholecalciferol (VITAMIN D3) 50 MCG (2000 UT) capsule Take 1 capsule (2,000 Units total) by mouth daily. 100 capsule 3  . diazepam (VALIUM) 5 MG tablet TAKE 1 TABLET BY MOUTH EVERY 12 HOURS AS NEEDED FOR ANXIETY OR MUSCLE SPASMS. 30 tablet 3  . ibuprofen (ADVIL,MOTRIN) 400 MG tablet Take 400 mg by mouth every 6 (six) hours as needed.    . loratadine (CLARITIN) 10 MG tablet Take 1 tablet (10 mg total) by mouth daily. 30 tablet 11  . losartan (COZAAR) 100 MG tablet Take 1 tablet (100 mg total) by mouth daily. 90 tablet 3  . zolpidem (AMBIEN CR) 12.5 MG CR tablet TAKE 1 TABLET BY MOUTH AT BEDTIME AS NEEDED FOR SLEEP 30 tablet 3  . fexofenadine (ALLEGRA) 180 MG tablet Take 180 mg by mouth daily.  (Patient not taking: Reported on 11/26/2020)    . polyethylene glycol (MIRALAX / GLYCOLAX) packet Take 17 g by mouth 2 (two) times daily. (Patient not taking: Reported on  11/26/2020)     No facility-administered medications prior to visit.    ROS: Review of Systems  Constitutional: Positive for fatigue. Negative for activity change, appetite change, chills and unexpected weight change.  HENT: Negative for congestion, mouth sores and sinus pressure.   Eyes: Negative for visual disturbance.  Respiratory: Negative for cough and chest tightness.   Gastrointestinal: Negative for abdominal pain and nausea.  Genitourinary: Negative for difficulty urinating, frequency and vaginal pain.  Musculoskeletal: Positive for arthralgias and back pain. Negative for gait problem.  Skin: Negative for pallor and rash.  Neurological: Negative for dizziness, tremors, weakness, numbness and headaches.  Psychiatric/Behavioral: Negative for confusion and sleep disturbance. The patient is nervous/anxious.     Objective:  BP 138/62 (BP Location: Left Arm)   Pulse (!) 104   Temp 98.4 F (36.9 C) (Oral)   Ht 5\' 3"  (1.6 m)   Wt 186 lb (84.4 kg)   SpO2 96%   BMI 32.95 kg/m   BP Readings from Last 3 Encounters:  11/26/20 138/62  08/27/20 138/70  06/22/20 118/74    Wt Readings from Last 3 Encounters:  11/26/20 186 lb (84.4 kg)  08/27/20 186 lb 6.4 oz (84.6 kg)  06/22/20 186 lb (84.4 kg)    Physical  Exam Constitutional:      General: She is not in acute distress.    Appearance: She is well-developed. She is obese.  HENT:     Head: Normocephalic.     Right Ear: External ear normal.     Left Ear: External ear normal.     Nose: Nose normal.  Eyes:     General:        Right eye: No discharge.        Left eye: No discharge.     Conjunctiva/sclera: Conjunctivae normal.     Pupils: Pupils are equal, round, and reactive to light.  Neck:     Thyroid: No thyromegaly.     Vascular: No JVD.     Trachea: No tracheal deviation.  Cardiovascular:     Rate and Rhythm: Normal rate and regular rhythm.     Heart sounds: Normal heart sounds.  Pulmonary:     Effort: No  respiratory distress.     Breath sounds: No stridor. No wheezing.  Abdominal:     General: Bowel sounds are normal. There is no distension.     Palpations: Abdomen is soft. There is no mass.     Tenderness: There is no abdominal tenderness. There is no guarding or rebound.  Musculoskeletal:        General: Tenderness present.     Cervical back: Normal range of motion and neck supple.  Lymphadenopathy:     Cervical: No cervical adenopathy.  Skin:    Findings: No erythema or rash.  Neurological:     Cranial Nerves: No cranial nerve deficit.     Motor: No abnormal muscle tone.     Coordination: Coordination normal.     Gait: Gait abnormal.     Deep Tendon Reflexes: Reflexes normal.  Psychiatric:        Behavior: Behavior normal.        Thought Content: Thought content normal.        Judgment: Judgment normal.   antalgic gait  Lab Results  Component Value Date   WBC 7.8 02/04/2020   HGB 13.3 02/04/2020   HCT 39.7 02/04/2020   PLT 330 02/04/2020   GLUCOSE 100 (H) 02/04/2020   CHOL 221 (H) 10/20/2014   TRIG 140.0 10/20/2014   HDL 54.00 10/20/2014   LDLDIRECT 161.3 06/17/2011   LDLCALC 139 (H) 10/20/2014   ALT 19 02/04/2020   AST 17 02/04/2020   NA 137 02/04/2020   K 4.1 02/04/2020   CL 106 02/04/2020   CREATININE 0.91 02/04/2020   BUN 14 02/04/2020   CO2 22 02/04/2020   TSH 2.33 01/08/2019   INR 1.06 06/12/2018    CT Head Wo Contrast  Result Date: 02/24/2020 CLINICAL DATA:  Facial trauma. Neck pain, acute, no red flags. Additional history provided: Fall hitting back of head. EXAM: CT HEAD WITHOUT CONTRAST CT CERVICAL SPINE WITHOUT CONTRAST TECHNIQUE: Multidetector CT imaging of the head and cervical spine was performed following the standard protocol without intravenous contrast. Multiplanar CT image reconstructions of the cervical spine were also generated. COMPARISON:  CT head/cervical spine 06/12/2018 FINDINGS: CT HEAD FINDINGS Brain: Stable, mild generalized  parenchymal atrophy. There is no acute intracranial hemorrhage. No demarcated cortical infarct. No extra-axial fluid collection. No evidence of intracranial mass. No midline shift. Vascular: No hyperdense vessel. Skull: Normal. Negative for fracture or focal lesion. Sinuses/Orbits: Visualized orbits show no acute finding. No significant paranasal sinus disease or mastoid effusion at the imaged levels. Other: Subtle right parietal scalp laceration.  CT CERVICAL SPINE FINDINGS Alignment: Straightening of the expected cervical lordosis. Unchanged 2 mm C2-C3 grade 1 anterolisthesis. Skull base and vertebrae: The basion-dental and atlanto-dental intervals are maintained.No evidence of acute fracture to the cervical spine. Sequela of prior C4-C5 and C5-C6 ACDF. There is solid fusion across the disc space and posterior element at C4-C5. Unchanged, there is absence of fusion across the C5-C6 disc space. Soft tissues and spinal canal: No prevertebral fluid or swelling. No visible canal hematoma. Disc levels: Cervical spondylosis. Most notably at C3-C4 and C6-C7 there is advanced disc space narrowing with posterior disc osteophytes and uncovertebral hypertrophy. Severe disc space narrowing is also present at C7-T1 Upper chest: No visible pneumothorax. Patchy ground-glass opacity within the imaged lung apices. IMPRESSION: CT head: 1. No evidence of acute intracranial abnormality. 2. Right parietal scalp laceration. 3. Stable, mild generalized parenchymal atrophy. CT cervical spine: 1. No evidence of acute fracture to the cervical spine. 2. Prior C4-C6 ACDF. No evidence of hardware compromise. 3. Cervical spondylosis greatest at C3-C4 and C6-C7. 4. Patchy ground-glass opacity within the imaged lung apices which is nonspecific, but may reflect edema. Correlate clinically to exclude signs/symptoms of infection. Electronically Signed   By: Kellie Simmering DO   On: 02/24/2020 16:46   CT Cervical Spine Wo Contrast  Result Date:  02/24/2020 CLINICAL DATA:  Facial trauma. Neck pain, acute, no red flags. Additional history provided: Fall hitting back of head. EXAM: CT HEAD WITHOUT CONTRAST CT CERVICAL SPINE WITHOUT CONTRAST TECHNIQUE: Multidetector CT imaging of the head and cervical spine was performed following the standard protocol without intravenous contrast. Multiplanar CT image reconstructions of the cervical spine were also generated. COMPARISON:  CT head/cervical spine 06/12/2018 FINDINGS: CT HEAD FINDINGS Brain: Stable, mild generalized parenchymal atrophy. There is no acute intracranial hemorrhage. No demarcated cortical infarct. No extra-axial fluid collection. No evidence of intracranial mass. No midline shift. Vascular: No hyperdense vessel. Skull: Normal. Negative for fracture or focal lesion. Sinuses/Orbits: Visualized orbits show no acute finding. No significant paranasal sinus disease or mastoid effusion at the imaged levels. Other: Subtle right parietal scalp laceration. CT CERVICAL SPINE FINDINGS Alignment: Straightening of the expected cervical lordosis. Unchanged 2 mm C2-C3 grade 1 anterolisthesis. Skull base and vertebrae: The basion-dental and atlanto-dental intervals are maintained.No evidence of acute fracture to the cervical spine. Sequela of prior C4-C5 and C5-C6 ACDF. There is solid fusion across the disc space and posterior element at C4-C5. Unchanged, there is absence of fusion across the C5-C6 disc space. Soft tissues and spinal canal: No prevertebral fluid or swelling. No visible canal hematoma. Disc levels: Cervical spondylosis. Most notably at C3-C4 and C6-C7 there is advanced disc space narrowing with posterior disc osteophytes and uncovertebral hypertrophy. Severe disc space narrowing is also present at C7-T1 Upper chest: No visible pneumothorax. Patchy ground-glass opacity within the imaged lung apices. IMPRESSION: CT head: 1. No evidence of acute intracranial abnormality. 2. Right parietal scalp  laceration. 3. Stable, mild generalized parenchymal atrophy. CT cervical spine: 1. No evidence of acute fracture to the cervical spine. 2. Prior C4-C6 ACDF. No evidence of hardware compromise. 3. Cervical spondylosis greatest at C3-C4 and C6-C7. 4. Patchy ground-glass opacity within the imaged lung apices which is nonspecific, but may reflect edema. Correlate clinically to exclude signs/symptoms of infection. Electronically Signed   By: Kellie Simmering DO   On: 02/24/2020 16:46    Assessment & Plan:    [name], MD

## 2020-11-26 NOTE — Assessment & Plan Note (Signed)
Wellbutrin Diazepam prn

## 2020-11-26 NOTE — Assessment & Plan Note (Signed)
Take Lion's mane

## 2020-11-27 ENCOUNTER — Telehealth: Payer: Self-pay | Admitting: Internal Medicine

## 2020-11-27 MED ORDER — ZOLPIDEM TARTRATE ER 6.25 MG PO TBCR: 6.2500 mg | EXTENDED_RELEASE_TABLET | Freq: Every evening | ORAL | 1 refills | Status: DC | PRN

## 2020-11-27 NOTE — Telephone Encounter (Signed)
Okay.  Thanks.

## 2020-11-27 NOTE — Telephone Encounter (Signed)
  Zolpidem Tartrate  [facility] [id number] [place], [place] Phone:  [phone]  Fax:  [phone]     Patient cancelled her script with [facility] because it was going to be $180 to get filled thru them, requesting we sent it to pharmacy above

## 2020-12-03 ENCOUNTER — Ambulatory Visit: Payer: Medicare Other

## 2020-12-13 ENCOUNTER — Other Ambulatory Visit: Payer: Self-pay | Admitting: Oncology

## 2020-12-13 DIAGNOSIS — I700 Atherosclerosis of aorta: Secondary | ICD-10-CM | POA: Insufficient documentation

## 2020-12-13 NOTE — Assessment & Plan Note (Signed)
On diet, exercise 

## 2020-12-15 ENCOUNTER — Other Ambulatory Visit: Payer: Self-pay

## 2020-12-15 ENCOUNTER — Ambulatory Visit (INDEPENDENT_AMBULATORY_CARE_PROVIDER_SITE_OTHER): Payer: Medicare Other

## 2020-12-15 VITALS — BP 130/72 | HR 100 | Temp 98.0°F | Ht 63.0 in | Wt 186.4 lb

## 2020-12-15 DIAGNOSIS — Z0000 Encounter for general adult medical examination without abnormal findings: Secondary | ICD-10-CM | POA: Diagnosis not present
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
## 2020-12-15 NOTE — Patient Instructions (Addendum)
[name] , Thank you for taking time to come for your Medicare Wellness Visit. I appreciate your ongoing commitment to your health goals. Please review the following plan we discussed and let me know if I can assist you in the future.   Screening recommendations/referrals: Colonoscopy: last done 08/30/2017 Mammogram: 02/13/2020 Bone Density: 05/16/2019; normal results Recommended yearly ophthalmology/optometry visit for glaucoma screening and checkup Recommended yearly dental visit for hygiene and checkup  Vaccinations: Influenza vaccine: 04/29/2020 Pneumococcal vaccine: 06/04/2008, 11/16/2016 Tdap vaccine: never done Shingles vaccine: Please call your insurance company to determine your out of pocket expense for the Shingrix vaccine. You may receive this vaccine at your local pharmacy. Covid-19: 2/163/2021, 10/13/2019, 05/26/2020, 11/05/2020  Advanced directives: Please bring a copy of your health care power of attorney and living will to the office at your convenience.  Conditions/risks identified: Yes; Reviewed health maintenance screenings with patient today and relevant education, vaccines, and/or referrals were provided. Please continue to do your personal lifestyle choices by: daily care of teeth and gums, regular physical activity (goal should be 5 days a week for 30 minutes), eat a healthy diet, avoid tobacco and drug use, limiting any alcohol intake, taking a low-dose aspirin (if not allergic or have been advised by your provider otherwise) and taking vitamins and minerals as recommended by your provider. Continue doing brain stimulating activities (puzzles, reading, adult coloring books, staying active) to keep memory sharp. Continue to eat heart healthy diet (full of fruits, vegetables, whole grains, lean protein, water--limit salt, fat, and sugar intake) and increase physical activity as tolerated.  Next appointment: Please schedule your next Medicare Wellness Visit with your Nurse  Health Advisor in 1 year by calling [phone].   Preventive Care 81 Years and Older, Female Preventive care refers to lifestyle choices and visits with your health care provider that can promote health and wellness. What does preventive care include?  A yearly physical exam. This is also called an annual well check.  Dental exams once or twice a year.  Routine eye exams. Ask your health care provider how often you should have your eyes checked.  Personal lifestyle choices, including:  Daily care of your teeth and gums.  Regular physical activity.  Eating a healthy diet.  Avoiding tobacco and drug use.  Limiting alcohol use.  Practicing safe sex.  Taking low-dose aspirin every day.  Taking vitamin and mineral supplements as recommended by your health care provider. What happens during an annual well check? The services and screenings done by your health care provider during your annual well check will depend on your age, overall health, lifestyle risk factors, and family history of disease. Counseling  Your health care provider may ask you questions about your:  Alcohol use.  Tobacco use.  Drug use.  Emotional well-being.  Home and relationship well-being.  Sexual activity.  Eating habits.  History of falls.  Memory and ability to understand (cognition).  Work and work Statistician.  Reproductive health. Screening  You may have the following tests or measurements:  Height, weight, and BMI.  Blood pressure.  Lipid and cholesterol levels. These may be checked every 5 years, or more frequently if you are over 4 years old.  Skin check.  Lung cancer screening. You may have this screening every year starting at age 81 if you have a 30-pack-year history of smoking and currently smoke or have quit within the past 15 years.  Fecal occult blood test (FOBT) of the stool. You may have this test  every year starting at age 81.  Flexible sigmoidoscopy or  colonoscopy. You may have a sigmoidoscopy every 5 years or a colonoscopy every 10 years starting at age 81.  Hepatitis C blood test.  Hepatitis B blood test.  Sexually transmitted disease (STD) testing.  Diabetes screening. This is done by checking your blood sugar (glucose) after you have not eaten for a while (fasting). You may have this done every 1-3 years.  Bone density scan. This is done to screen for osteoporosis. You may have this done starting at age 81.  Mammogram. This may be done every 1-2 years. Talk to your health care provider about how often you should have regular mammograms. Talk with your health care provider about your test results, treatment options, and if necessary, the need for more tests. Vaccines  Your health care provider may recommend certain vaccines, such as:  Influenza vaccine. This is recommended every year.  Tetanus, diphtheria, and acellular pertussis (Tdap, Td) vaccine. You may need a Td booster every 10 years.  Zoster vaccine. You may need this after age 81.  Pneumococcal 13-valent conjugate (PCV13) vaccine. One dose is recommended after age 81.  Pneumococcal polysaccharide (PPSV23) vaccine. One dose is recommended after age 81. Talk to your health care provider about which screenings and vaccines you need and how often you need them. This information is not intended to replace advice given to you by your health care provider. Make sure you discuss any questions you have with your health care provider. Document Released: 08/14/2015 Document Revised: 04/06/2016 Document Reviewed: 05/19/2015 Elsevier Interactive Patient Education  2017 [place] Prevention in the Home Falls can cause injuries. They can happen to people of all ages. There are many things you can do to make your home safe and to help prevent falls. What can I do on the outside of my home?  Regularly fix the edges of walkways and driveways and fix any cracks.  Remove  anything that might make you trip as you walk through a door, such as a raised step or threshold.  Trim any bushes or trees on the path to your home.  Use bright outdoor lighting.  Clear any walking paths of anything that might make someone trip, such as rocks or tools.  Regularly check to see if handrails are loose or broken. Make sure that both sides of any steps have handrails.  Any raised decks and porches should have guardrails on the edges.  Have any leaves, snow, or ice cleared regularly.  Use sand or salt on walking paths during winter.  Clean up any spills in your garage right away. This includes oil or grease spills. What can I do in the bathroom?  Use night lights.  Install grab bars by the toilet and in the tub and shower. Do not use towel bars as grab bars.  Use non-skid mats or decals in the tub or shower.  If you need to sit down in the shower, use a plastic, non-slip stool.  Keep the floor dry. Clean up any water that spills on the floor as soon as it happens.  Remove soap buildup in the tub or shower regularly.  Attach bath mats securely with double-sided non-slip rug tape.  Do not have throw rugs and other things on the floor that can make you trip. What can I do in the bedroom?  Use night lights.  Make sure that you have a light by your bed that is easy to reach.  Do not use any sheets or blankets that are too big for your bed. They should not hang down onto the floor.  Have a firm chair that has side arms. You can use this for support while you get dressed.  Do not have throw rugs and other things on the floor that can make you trip. What can I do in the kitchen?  Clean up any spills right away.  Avoid walking on wet floors.  Keep items that you use a lot in easy-to-reach places.  If you need to reach something above you, use a strong step stool that has a grab bar.  Keep electrical cords out of the way.  Do not use floor polish or wax that  makes floors slippery. If you must use wax, use non-skid floor wax.  Do not have throw rugs and other things on the floor that can make you trip. What can I do with my stairs?  Do not leave any items on the stairs.  Make sure that there are handrails on both sides of the stairs and use them. Fix handrails that are broken or loose. Make sure that handrails are as long as the stairways.  Check any carpeting to make sure that it is firmly attached to the stairs. Fix any carpet that is loose or worn.  Avoid having throw rugs at the top or bottom of the stairs. If you do have throw rugs, attach them to the floor with carpet tape.  Make sure that you have a light switch at the top of the stairs and the bottom of the stairs. If you do not have them, ask someone to add them for you. What else can I do to help prevent falls?  Wear shoes that:  Do not have high heels.  Have rubber bottoms.  Are comfortable and fit you well.  Are closed at the toe. Do not wear sandals.  If you use a stepladder:  Make sure that it is fully opened. Do not climb a closed stepladder.  Make sure that both sides of the stepladder are locked into place.  Ask someone to hold it for you, if possible.  Clearly mark and make sure that you can see:  Any grab bars or handrails.  First and last steps.  Where the edge of each step is.  Use tools that help you move around (mobility aids) if they are needed. These include:  Canes.  Walkers.  Scooters.  Crutches.  Turn on the lights when you go into a dark area. Replace any light bulbs as soon as they burn out.  Set up your furniture so you have a clear path. Avoid moving your furniture around.  If any of your floors are uneven, fix them.  If there are any pets around you, be aware of where they are.  Review your medicines with your doctor. Some medicines can make you feel dizzy. This can increase your chance of falling. Ask your doctor what other  things that you can do to help prevent falls. This information is not intended to replace advice given to you by your health care provider. Make sure you discuss any questions you have with your health care provider. Document Released: 05/14/2009 Document Revised: 12/24/2015 Document Reviewed: 08/22/2014 Elsevier Interactive Patient Education  2017 [employer].

## 2020-12-16 NOTE — Telephone Encounter (Signed)
Place in " purple order" folder to sign.Marland KitchenJohny [name]

## 2020-12-30 ENCOUNTER — Telehealth: Payer: Self-pay | Admitting: Oncology

## 2020-12-30 ENCOUNTER — Other Ambulatory Visit: Payer: Self-pay | Admitting: Oncology

## 2020-12-30 DIAGNOSIS — Z1231 Encounter for screening mammogram for malignant neoplasm of breast: Secondary | ICD-10-CM

## 2020-12-30 NOTE — Telephone Encounter (Signed)
Rescheduled appointment per provider. Left a detailed message.  

## 2020-12-31 ENCOUNTER — Other Ambulatory Visit: Payer: Self-pay | Admitting: Oncology

## 2020-12-31 DIAGNOSIS — F339 Major depressive disorder, recurrent, unspecified: Secondary | ICD-10-CM

## 2021-01-29 ENCOUNTER — Other Ambulatory Visit: Payer: Self-pay | Admitting: Internal Medicine

## 2021-02-11 ENCOUNTER — Other Ambulatory Visit: Payer: Self-pay | Admitting: Oncology

## 2021-02-11 DIAGNOSIS — C50412 Malignant neoplasm of upper-outer quadrant of left female breast: Secondary | ICD-10-CM

## 2021-02-11 DIAGNOSIS — Z170 Estrogen receptor positive status [ER+]: Secondary | ICD-10-CM

## 2021-02-18 ENCOUNTER — Other Ambulatory Visit: Payer: Medicare Other

## 2021-02-18 ENCOUNTER — Ambulatory Visit: Payer: Medicare Other | Admitting: Oncology

## 2021-02-19 ENCOUNTER — Other Ambulatory Visit: Payer: Self-pay | Admitting: *Deleted

## 2021-02-19 DIAGNOSIS — Z170 Estrogen receptor positive status [ER+]: Secondary | ICD-10-CM

## 2021-02-19 MED ORDER — ANASTROZOLE 1 MG PO TABS: 1.0000 mg | ORAL_TABLET | Freq: Every day | ORAL | 3 refills | Status: DC

## 2021-02-23 ENCOUNTER — Telehealth: Payer: Self-pay | Admitting: *Deleted

## 2021-02-23 MED ORDER — LOSARTAN POTASSIUM 100 MG PO TABS: 100.0000 mg | ORAL_TABLET | Freq: Every day | ORAL | 3 refills | Status: DC

## 2021-02-23 NOTE — Telephone Encounter (Signed)
-----   Message from Eather Colas sent at 02/23/2021 12:51 PM EDT ----- [facility]ock Prescription request

## 2021-02-23 NOTE — Telephone Encounter (Signed)
Refill sent to [facility] 

## 2021-02-24 ENCOUNTER — Encounter: Payer: Self-pay | Admitting: Internal Medicine

## 2021-02-24 ENCOUNTER — Other Ambulatory Visit: Payer: Self-pay

## 2021-02-24 ENCOUNTER — Ambulatory Visit (INDEPENDENT_AMBULATORY_CARE_PROVIDER_SITE_OTHER): Payer: Medicare Other | Admitting: Internal Medicine

## 2021-02-24 VITALS — BP 138/76 | HR 76 | Temp 98.3°F | Ht 63.0 in | Wt 185.0 lb

## 2021-02-24 DIAGNOSIS — E785 Hyperlipidemia, unspecified: Secondary | ICD-10-CM | POA: Diagnosis not present

## 2021-02-24 DIAGNOSIS — I10 Essential (primary) hypertension: Secondary | ICD-10-CM | POA: Diagnosis not present

## 2021-02-24 DIAGNOSIS — E538 Deficiency of other specified B group vitamins: Secondary | ICD-10-CM | POA: Diagnosis not present

## 2021-02-24 DIAGNOSIS — Z6834 Body mass index (BMI) 34.0-34.9, adult: Secondary | ICD-10-CM

## 2021-02-24 DIAGNOSIS — R413 Other amnesia: Secondary | ICD-10-CM | POA: Diagnosis not present

## 2021-02-24 DIAGNOSIS — E6609 Other obesity due to excess calories: Secondary | ICD-10-CM

## 2021-02-24 DIAGNOSIS — E559 Vitamin D deficiency, unspecified: Secondary | ICD-10-CM

## 2021-02-24 LAB — COMPREHENSIVE METABOLIC PANEL
ALT: 17 U/L (ref 0–35)
AST: 19 U/L (ref 0–37)
Albumin: 4.3 g/dL (ref 3.5–5.2)
Alkaline Phosphatase: 81 U/L (ref 39–117)
BUN: 12 mg/dL (ref 6–23)
CO2: 25 mEq/L (ref 19–32)
Calcium: 9.7 mg/dL (ref 8.4–10.5)
Chloride: 103 mEq/L (ref 96–112)
Creatinine, Ser: 0.96 mg/dL (ref 0.40–1.20)
GFR: 55.56 mL/min — ABNORMAL LOW (ref >60.00)
Glucose, Bld: 90 mg/dL (ref 70–99)
Potassium: 4.2 mEq/L (ref 3.5–5.1)
Sodium: 138 mEq/L (ref 135–145)
Total Bilirubin: 0.3 mg/dL (ref 0.2–1.2)
Total Protein: 7.4 g/dL (ref 6.0–8.3)

## 2021-02-24 LAB — URINALYSIS, ROUTINE W REFLEX MICROSCOPIC
Bilirubin Urine: NEGATIVE
Hgb urine dipstick: NEGATIVE
Nitrite: NEGATIVE
RBC / HPF: NONE SEEN (ref 0–?)
Specific Gravity, Urine: >=1.03 — AB (ref 1.000–1.030)
Total Protein, Urine: NEGATIVE
Urine Glucose: NEGATIVE
Urobilinogen, UA: 0.2 (ref 0.0–1.0)
pH: 5.5 (ref 5.0–8.0)

## 2021-02-24 LAB — CBC WITH DIFFERENTIAL/PLATELET
Basophils Absolute: 0.1 10*3/uL (ref 0.0–0.1)
Basophils Relative: 0.6 % (ref 0.0–3.0)
Eosinophils Absolute: 0.1 10*3/uL (ref 0.0–0.7)
Eosinophils Relative: 1.5 % (ref 0.0–5.0)
HCT: 39.8 % (ref 36.0–46.0)
Hemoglobin: 13.3 g/dL (ref 12.0–15.0)
Lymphocytes Relative: 28.3 % (ref 12.0–46.0)
Lymphs Abs: 2.5 10*3/uL (ref 0.7–4.0)
MCHC: 33.3 g/dL (ref 30.0–36.0)
MCV: 95.9 fl (ref 78.0–100.0)
Monocytes Absolute: 1 10*3/uL (ref 0.1–1.0)
Monocytes Relative: 11.3 % (ref 3.0–12.0)
Neutro Abs: 5 10*3/uL (ref 1.4–7.7)
Neutrophils Relative %: 58.3 % (ref 43.0–77.0)
Platelets: 348 10*3/uL (ref 150.0–400.0)
RBC: 4.15 Mil/uL (ref 3.87–5.11)
RDW: 13.2 % (ref 11.5–15.5)
WBC: 8.7 10*3/uL (ref 4.0–10.5)

## 2021-02-24 LAB — LIPID PANEL
Cholesterol: 218 mg/dL — ABNORMAL HIGH (ref 0–200)
HDL: 52 mg/dL (ref >39.00)
LDL Cholesterol: 126 mg/dL — ABNORMAL HIGH (ref 0–99)
NonHDL: 166.22
Total CHOL/HDL Ratio: 4
Triglycerides: 199 mg/dL — ABNORMAL HIGH (ref 0.0–149.0)
VLDL: 39.8 mg/dL (ref 0.0–40.0)

## 2021-02-24 LAB — TSH: TSH: 2.2 u[IU]/mL (ref 0.35–5.50)

## 2021-02-24 LAB — VITAMIN D 25 HYDROXY (VIT D DEFICIENCY, FRACTURES): VITD: 35.69 ng/mL (ref 30.00–100.00)

## 2021-02-24 LAB — VITAMIN B12: Vitamin B-12: 281 pg/mL (ref 211–911)

## 2021-02-24 NOTE — Assessment & Plan Note (Addendum)
Check Vit D levels.  Continue with vitamin D orally

## 2021-02-24 NOTE — Progress Notes (Signed)
Subjective:  Patient ID: [name], female    DOB: Oct 05, 1940  Age: 80 y.o. MRN: [id number]  CC: Follow-up (3 month f/u)   HPI Cherylle Spadafore presents for LBP, OA, insomnia  Outpatient Medications Prior to Visit  Medication Sig Dispense Refill   acetaminophen (TYLENOL) 500 MG tablet Take 1,000 mg by mouth every 6 (six) hours as needed.     amLODipine (NORVASC) 5 MG tablet TAKE ONE TABLET BY MOUTH DAILY 90 tablet 3   amoxicillin (AMOXIL) 500 MG capsule TAKE 4 CAPSULES (2,000 MG TOTAL) BY MOUTH DAILY. FOR DENTAL VISITS. 20 capsule 3   anastrozole (ARIMIDEX) 1 MG tablet Take 1 tablet (1 mg total) by mouth daily. 90 tablet 3   Ascorbic Acid (VITAMIN C PO) Take by mouth daily.     b complex vitamins tablet Take 1 tablet by mouth daily. 100 tablet 3   buPROPion (WELLBUTRIN XL) 150 MG 24 hr tablet TAKE 1 TABLET BY MOUTH  DAILY 90 tablet 3   Cholecalciferol (VITAMIN D3) 50 MCG (2000 UT) capsule Take 1 capsule (2,000 Units total) by mouth daily. 100 capsule 3   diazepam (VALIUM) 5 MG tablet TAKE 1 TABLET BY MOUTH EVERY 12 HOURS AS NEEDED FOR ANXIETY OR MUSCLE SPASMS. 30 tablet 3   ibuprofen (ADVIL,MOTRIN) 400 MG tablet Take 400 mg by mouth every 6 (six) hours as needed.     loratadine (CLARITIN) 10 MG tablet Take 1 tablet (10 mg total) by mouth daily. 30 tablet 11   losartan (COZAAR) 100 MG tablet Take 1 tablet (100 mg total) by mouth daily. 90 tablet 3   zolpidem (AMBIEN CR) 6.25 MG CR tablet Take 1 tablet (6.25 mg total) by mouth at bedtime as needed for sleep. 90 tablet 1   No facility-administered medications prior to visit.    ROS: Review of Systems  Constitutional:  Negative for activity change, appetite change, chills, fatigue and unexpected weight change.  HENT:  Negative for congestion, mouth sores and sinus pressure.   Eyes:  Negative for visual disturbance.  Respiratory:  Negative for cough and chest tightness.   Gastrointestinal:  Negative for abdominal  pain and nausea.  Genitourinary:  Negative for difficulty urinating, frequency and vaginal pain.  Musculoskeletal:  Positive for arthralgias, back pain, gait problem and neck stiffness. Negative for neck pain.  Skin:  Negative for pallor and rash.  Neurological:  Negative for dizziness, tremors, weakness, numbness and headaches.  Psychiatric/Behavioral:  Positive for decreased concentration and sleep disturbance. Negative for confusion. The patient is nervous/anxious.    Objective:  BP 138/76 (BP Location: Left Arm)   Pulse 76   Temp 98.3 F (36.8 C) (Oral)   Ht '5\' 3"'$  (1.6 m)   Wt 185 lb (83.9 kg)   SpO2 96%   BMI 32.77 kg/m   BP Readings from Last 3 Encounters:  02/24/21 138/76  12/15/20 130/72  11/26/20 138/62    Wt Readings from Last 3 Encounters:  02/24/21 185 lb (83.9 kg)  12/15/20 186 lb 6.4 oz (84.6 kg)  11/26/20 186 lb (84.4 kg)    Physical Exam Constitutional:      General: She is not in acute distress.    Appearance: She is well-developed. She is obese.  HENT:     Head: Normocephalic.     Right Ear: External ear normal.     Left Ear: External ear normal.     Nose: Nose normal.  Eyes:     General:  Right eye: No discharge.        Left eye: No discharge.     Conjunctiva/sclera: Conjunctivae normal.     Pupils: Pupils are equal, round, and reactive to light.  Neck:     Thyroid: No thyromegaly.     Vascular: No JVD.     Trachea: No tracheal deviation.  Cardiovascular:     Rate and Rhythm: Normal rate and regular rhythm.     Heart sounds: Normal heart sounds.  Pulmonary:     Effort: No respiratory distress.     Breath sounds: No stridor. No wheezing.  Abdominal:     General: Bowel sounds are normal. There is no distension.     Palpations: Abdomen is soft. There is no mass.     Tenderness: There is no abdominal tenderness. There is no guarding or rebound.  Musculoskeletal:        General: Tenderness present.     Cervical back: Normal range of  motion and neck supple. No rigidity.  Lymphadenopathy:     Cervical: No cervical adenopathy.  Skin:    Findings: No erythema or rash.  Neurological:     Mental Status: She is oriented to person, place, and time.     Cranial Nerves: No cranial nerve deficit.     Motor: No weakness or abnormal muscle tone.     Coordination: Coordination normal.     Gait: Gait abnormal.     Deep Tendon Reflexes: Reflexes normal.  Psychiatric:        Behavior: Behavior normal.        Thought Content: Thought content normal.        Judgment: Judgment normal.   LS spine w/pain Lab Results  Component Value Date   WBC 7.8 02/04/2020   HGB 13.3 02/04/2020   HCT 39.7 02/04/2020   PLT 330 02/04/2020   GLUCOSE 100 (H) 02/04/2020   CHOL 221 (H) 10/20/2014   TRIG 140.0 10/20/2014   HDL 54.00 10/20/2014   LDLDIRECT 161.3 06/17/2011   LDLCALC 139 (H) 10/20/2014   ALT 19 02/04/2020   AST 17 02/04/2020   NA 137 02/04/2020   K 4.1 02/04/2020   CL 106 02/04/2020   CREATININE 0.91 02/04/2020   BUN 14 02/04/2020   CO2 22 02/04/2020   TSH 2.33 01/08/2019   INR 1.06 06/12/2018    CT Head Wo Contrast  Result Date: 02/24/2020 CLINICAL DATA:  Facial trauma. Neck pain, acute, no red flags. Additional history provided: Fall hitting back of head. EXAM: CT HEAD WITHOUT CONTRAST CT CERVICAL SPINE WITHOUT CONTRAST TECHNIQUE: Multidetector CT imaging of the head and cervical spine was performed following the standard protocol without intravenous contrast. Multiplanar CT image reconstructions of the cervical spine were also generated. COMPARISON:  CT head/cervical spine 06/12/2018 FINDINGS: CT HEAD FINDINGS Brain: Stable, mild generalized parenchymal atrophy. There is no acute intracranial hemorrhage. No demarcated cortical infarct. No extra-axial fluid collection. No evidence of intracranial mass. No midline shift. Vascular: No hyperdense vessel. Skull: Normal. Negative for fracture or focal lesion. Sinuses/Orbits:  Visualized orbits show no acute finding. No significant paranasal sinus disease or mastoid effusion at the imaged levels. Other: Subtle right parietal scalp laceration. CT CERVICAL SPINE FINDINGS Alignment: Straightening of the expected cervical lordosis. Unchanged 2 mm C2-C3 grade 1 anterolisthesis. Skull base and vertebrae: The basion-dental and atlanto-dental intervals are maintained.No evidence of acute fracture to the cervical spine. Sequela of prior C4-C5 and C5-C6 ACDF. There is solid fusion across the disc space and  posterior element at C4-C5. Unchanged, there is absence of fusion across the C5-C6 disc space. Soft tissues and spinal canal: No prevertebral fluid or swelling. No visible canal hematoma. Disc levels: Cervical spondylosis. Most notably at C3-C4 and C6-C7 there is advanced disc space narrowing with posterior disc osteophytes and uncovertebral hypertrophy. Severe disc space narrowing is also present at C7-T1 Upper chest: No visible pneumothorax. Patchy ground-glass opacity within the imaged lung apices. IMPRESSION: CT head: 1. No evidence of acute intracranial abnormality. 2. Right parietal scalp laceration. 3. Stable, mild generalized parenchymal atrophy. CT cervical spine: 1. No evidence of acute fracture to the cervical spine. 2. Prior C4-C6 ACDF. No evidence of hardware compromise. 3. Cervical spondylosis greatest at C3-C4 and C6-C7. 4. Patchy ground-glass opacity within the imaged lung apices which is nonspecific, but may reflect edema. Correlate clinically to exclude signs/symptoms of infection. Electronically Signed   By: Kellie Simmering DO   On: 02/24/2020 16:46   CT Cervical Spine Wo Contrast  Result Date: 02/24/2020 CLINICAL DATA:  Facial trauma. Neck pain, acute, no red flags. Additional history provided: Fall hitting back of head. EXAM: CT HEAD WITHOUT CONTRAST CT CERVICAL SPINE WITHOUT CONTRAST TECHNIQUE: Multidetector CT imaging of the head and cervical spine was performed following  the standard protocol without intravenous contrast. Multiplanar CT image reconstructions of the cervical spine were also generated. COMPARISON:  CT head/cervical spine 06/12/2018 FINDINGS: CT HEAD FINDINGS Brain: Stable, mild generalized parenchymal atrophy. There is no acute intracranial hemorrhage. No demarcated cortical infarct. No extra-axial fluid collection. No evidence of intracranial mass. No midline shift. Vascular: No hyperdense vessel. Skull: Normal. Negative for fracture or focal lesion. Sinuses/Orbits: Visualized orbits show no acute finding. No significant paranasal sinus disease or mastoid effusion at the imaged levels. Other: Subtle right parietal scalp laceration. CT CERVICAL SPINE FINDINGS Alignment: Straightening of the expected cervical lordosis. Unchanged 2 mm C2-C3 grade 1 anterolisthesis. Skull base and vertebrae: The basion-dental and atlanto-dental intervals are maintained.No evidence of acute fracture to the cervical spine. Sequela of prior C4-C5 and C5-C6 ACDF. There is solid fusion across the disc space and posterior element at C4-C5. Unchanged, there is absence of fusion across the C5-C6 disc space. Soft tissues and spinal canal: No prevertebral fluid or swelling. No visible canal hematoma. Disc levels: Cervical spondylosis. Most notably at C3-C4 and C6-C7 there is advanced disc space narrowing with posterior disc osteophytes and uncovertebral hypertrophy. Severe disc space narrowing is also present at C7-T1 Upper chest: No visible pneumothorax. Patchy ground-glass opacity within the imaged lung apices. IMPRESSION: CT head: 1. No evidence of acute intracranial abnormality. 2. Right parietal scalp laceration. 3. Stable, mild generalized parenchymal atrophy. CT cervical spine: 1. No evidence of acute fracture to the cervical spine. 2. Prior C4-C6 ACDF. No evidence of hardware compromise. 3. Cervical spondylosis greatest at C3-C4 and C6-C7. 4. Patchy ground-glass opacity within the imaged  lung apices which is nonspecific, but may reflect edema. Correlate clinically to exclude signs/symptoms of infection. Electronically Signed   By: Kellie Simmering DO   On: 02/24/2020 16:46    Assessment & Plan:     [name], MD

## 2021-02-24 NOTE — Patient Instructions (Signed)
Try to take power naps

## 2021-02-24 NOTE — Assessment & Plan Note (Signed)
Wt Readings from Last 3 Encounters:  02/24/21 185 lb (83.9 kg)  12/15/20 186 lb 6.4 oz (84.6 kg)  11/26/20 186 lb (84.4 kg)

## 2021-02-24 NOTE — Assessment & Plan Note (Addendum)
On B12.  Continue current dose Check B12 level

## 2021-02-24 NOTE — Assessment & Plan Note (Addendum)
Not better. On [place] Mush[employer] capsules for memory. We discussed other options to treat memory loss.  Pt declined Aricept at present

## 2021-02-28 ENCOUNTER — Other Ambulatory Visit: Payer: Self-pay | Admitting: Internal Medicine

## 2021-02-28 MED ORDER — VITAMIN D3 50 MCG (2000 UT) PO CAPS: 4000.0000 [IU] | ORAL_CAPSULE | Freq: Every day | ORAL | 3 refills | Status: AC

## 2021-03-01 ENCOUNTER — Other Ambulatory Visit: Payer: Self-pay | Admitting: Oncology

## 2021-03-01 DIAGNOSIS — Z853 Personal history of malignant neoplasm of breast: Secondary | ICD-10-CM

## 2021-03-04 ENCOUNTER — Other Ambulatory Visit: Payer: Self-pay

## 2021-03-04 ENCOUNTER — Ambulatory Visit
Admission: RE | Admit: 2021-03-04 | Discharge: 2021-03-04 | Disposition: A | Discharge status: Home / Self Care | Payer: Medicare Other | Source: Ambulatory Visit | Attending: Oncology | Admitting: Oncology

## 2021-03-04 ENCOUNTER — Other Ambulatory Visit: Payer: Self-pay | Admitting: Oncology

## 2021-03-04 DIAGNOSIS — Z853 Personal history of malignant neoplasm of breast: Secondary | ICD-10-CM

## 2021-03-08 NOTE — Assessment & Plan Note (Signed)
Continue with olmesartan and amlodipine.  Monitor blood pressure at home

## 2021-03-17 ENCOUNTER — Other Ambulatory Visit: Payer: Self-pay | Admitting: *Deleted

## 2021-03-17 DIAGNOSIS — C50412 Malignant neoplasm of upper-outer quadrant of left female breast: Secondary | ICD-10-CM

## 2021-03-17 DIAGNOSIS — Z170 Estrogen receptor positive status [ER+]: Secondary | ICD-10-CM

## 2021-03-18 ENCOUNTER — Other Ambulatory Visit: Payer: Self-pay

## 2021-03-18 ENCOUNTER — Inpatient Hospital Stay (HOSPITAL_BASED_OUTPATIENT_CLINIC_OR_DEPARTMENT_OTHER): Payer: Medicare Other | Admitting: Oncology

## 2021-03-18 ENCOUNTER — Inpatient Hospital Stay: Payer: Medicare Other | Attending: Oncology

## 2021-03-18 VITALS — BP 157/62 | HR 101 | Temp 99.3°F | Resp 18 | Ht 63.0 in | Wt 184.7 lb

## 2021-03-18 DIAGNOSIS — C50412 Malignant neoplasm of upper-outer quadrant of left female breast: Secondary | ICD-10-CM | POA: Diagnosis present

## 2021-03-18 DIAGNOSIS — Z170 Estrogen receptor positive status [ER+]: Secondary | ICD-10-CM | POA: Diagnosis not present

## 2021-03-18 DIAGNOSIS — Z808 Family history of malignant neoplasm of other organs or systems: Secondary | ICD-10-CM | POA: Diagnosis not present

## 2021-03-18 DIAGNOSIS — Z8249 Family history of ischemic heart disease and other diseases of the circulatory system: Secondary | ICD-10-CM | POA: Insufficient documentation

## 2021-03-18 DIAGNOSIS — R232 Flushing: Secondary | ICD-10-CM | POA: Insufficient documentation

## 2021-03-18 DIAGNOSIS — Z880 Allergy status to penicillin: Secondary | ICD-10-CM | POA: Insufficient documentation

## 2021-03-18 DIAGNOSIS — M8580 Other specified disorders of bone density and structure, unspecified site: Secondary | ICD-10-CM | POA: Insufficient documentation

## 2021-03-18 DIAGNOSIS — Z8261 Family history of arthritis: Secondary | ICD-10-CM | POA: Insufficient documentation

## 2021-03-18 DIAGNOSIS — Z881 Allergy status to other antibiotic agents status: Secondary | ICD-10-CM | POA: Diagnosis not present

## 2021-03-18 DIAGNOSIS — Z9049 Acquired absence of other specified parts of digestive tract: Secondary | ICD-10-CM | POA: Insufficient documentation

## 2021-03-18 DIAGNOSIS — Z888 Allergy status to other drugs, medicaments and biological substances status: Secondary | ICD-10-CM | POA: Diagnosis not present

## 2021-03-18 DIAGNOSIS — Z79811 Long term (current) use of aromatase inhibitors: Secondary | ICD-10-CM | POA: Insufficient documentation

## 2021-03-18 DIAGNOSIS — F339 Major depressive disorder, recurrent, unspecified: Secondary | ICD-10-CM | POA: Diagnosis not present

## 2021-03-18 DIAGNOSIS — E785 Hyperlipidemia, unspecified: Secondary | ICD-10-CM | POA: Insufficient documentation

## 2021-03-18 DIAGNOSIS — Z87891 Personal history of nicotine dependence: Secondary | ICD-10-CM | POA: Insufficient documentation

## 2021-03-18 DIAGNOSIS — Z79899 Other long term (current) drug therapy: Secondary | ICD-10-CM | POA: Diagnosis not present

## 2021-03-18 LAB — CBC WITH DIFFERENTIAL (CANCER CENTER ONLY)
Abs Immature Granulocytes: 0.03 10*3/uL (ref 0.00–0.07)
Basophils Absolute: 0.1 10*3/uL (ref 0.0–0.1)
Basophils Relative: 1 %
Eosinophils Absolute: 0.2 10*3/uL (ref 0.0–0.5)
Eosinophils Relative: 2 %
HCT: 38.4 % (ref 36.0–46.0)
Hemoglobin: 12.8 g/dL (ref 12.0–15.0)
Immature Granulocytes: 0 %
Lymphocytes Relative: 25 %
Lymphs Abs: 2.2 10*3/uL (ref 0.7–4.0)
MCH: 32.3 pg (ref 26.0–34.0)
MCHC: 33.3 g/dL (ref 30.0–36.0)
MCV: 97 fL (ref 80.0–100.0)
Monocytes Absolute: 0.9 10*3/uL (ref 0.1–1.0)
Monocytes Relative: 10 %
Neutro Abs: 5.6 10*3/uL (ref 1.7–7.7)
Neutrophils Relative %: 62 %
Platelet Count: 346 10*3/uL (ref 150–400)
RBC: 3.96 MIL/uL (ref 3.87–5.11)
RDW: 12.7 % (ref 11.5–15.5)
WBC Count: 8.9 10*3/uL (ref 4.0–10.5)
nRBC: 0 % (ref 0.0–0.2)

## 2021-03-18 LAB — CMP (CANCER CENTER ONLY)
ALT: 15 U/L (ref 0–44)
AST: 14 U/L — ABNORMAL LOW (ref 15–41)
Albumin: 3.8 g/dL (ref 3.5–5.0)
Alkaline Phosphatase: 93 U/L (ref 38–126)
Anion gap: 7 (ref 5–15)
BUN: 10 mg/dL (ref 8–23)
CO2: 27 mmol/L (ref 22–32)
Calcium: 9.6 mg/dL (ref 8.9–10.3)
Chloride: 104 mmol/L (ref 98–111)
Creatinine: 0.85 mg/dL (ref 0.44–1.00)
GFR, Estimated: >60 mL/min (ref >60)
Glucose, Bld: 91 mg/dL (ref 70–99)
Potassium: 4.2 mmol/L (ref 3.5–5.1)
Sodium: 138 mmol/L (ref 135–145)
Total Bilirubin: 0.4 mg/dL (ref 0.3–1.2)
Total Protein: 7 g/dL (ref 6.5–8.1)

## 2021-03-18 MED ORDER — BUPROPION HCL ER (XL) 150 MG PO TB24: 150.0000 mg | ORAL_TABLET | Freq: Every day | ORAL | 4 refills | Status: DC

## 2021-03-18 MED ORDER — ANASTROZOLE 1 MG PO TABS: 1.0000 mg | ORAL_TABLET | Freq: Every day | ORAL | 3 refills | Status: DC
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
## 2021-03-22 ENCOUNTER — Other Ambulatory Visit: Payer: Medicare Other

## 2021-03-22 ENCOUNTER — Ambulatory Visit: Payer: Medicare Other | Admitting: Oncology

## 2021-03-24 ENCOUNTER — Other Ambulatory Visit: Payer: Self-pay | Admitting: Internal Medicine

## 2021-03-24 MED ORDER — AMOXICILLIN 500 MG PO CAPS: ORAL_CAPSULE | ORAL | 1 refills | Status: DC

## 2021-03-24 MED ORDER — LOSARTAN POTASSIUM 100 MG PO TABS: 100.0000 mg | ORAL_TABLET | Freq: Every day | ORAL | 3 refills | Status: DC

## 2021-04-06 ENCOUNTER — Other Ambulatory Visit: Payer: Self-pay | Admitting: Internal Medicine

## 2021-04-06 DIAGNOSIS — F419 Anxiety disorder, unspecified: Secondary | ICD-10-CM

## 2021-04-07 NOTE — Telephone Encounter (Signed)
MD is out of the office until Monday 04/12/21... pls advise on refill.Marland KitchenJohny Chess

## 2021-05-24 ENCOUNTER — Other Ambulatory Visit: Payer: Self-pay | Admitting: Internal Medicine

## 2021-06-01 ENCOUNTER — Ambulatory Visit (INDEPENDENT_AMBULATORY_CARE_PROVIDER_SITE_OTHER): Payer: Medicare Other | Admitting: Internal Medicine

## 2021-06-01 ENCOUNTER — Other Ambulatory Visit: Payer: Self-pay

## 2021-06-01 ENCOUNTER — Encounter: Payer: Self-pay | Admitting: Internal Medicine

## 2021-06-01 VITALS — BP 138/68 | HR 87 | Temp 98.3°F | Ht 63.0 in | Wt 180.8 lb

## 2021-06-01 DIAGNOSIS — M5440 Lumbago with sciatica, unspecified side: Secondary | ICD-10-CM

## 2021-06-01 DIAGNOSIS — F413 Other mixed anxiety disorders: Secondary | ICD-10-CM

## 2021-06-01 DIAGNOSIS — F4323 Adjustment disorder with mixed anxiety and depressed mood: Secondary | ICD-10-CM

## 2021-06-01 DIAGNOSIS — G8929 Other chronic pain: Secondary | ICD-10-CM

## 2021-06-01 DIAGNOSIS — Z6834 Body mass index (BMI) 34.0-34.9, adult: Secondary | ICD-10-CM

## 2021-06-01 DIAGNOSIS — I10 Essential (primary) hypertension: Secondary | ICD-10-CM | POA: Diagnosis not present

## 2021-06-01 DIAGNOSIS — E559 Vitamin D deficiency, unspecified: Secondary | ICD-10-CM

## 2021-06-01 DIAGNOSIS — G4700 Insomnia, unspecified: Secondary | ICD-10-CM

## 2021-06-01 DIAGNOSIS — R413 Other amnesia: Secondary | ICD-10-CM | POA: Diagnosis not present

## 2021-06-01 DIAGNOSIS — E538 Deficiency of other specified B group vitamins: Secondary | ICD-10-CM

## 2021-06-01 DIAGNOSIS — E6609 Other obesity due to excess calories: Secondary | ICD-10-CM

## 2021-06-01 DIAGNOSIS — Z23 Encounter for immunization: Secondary | ICD-10-CM

## 2021-06-01 NOTE — Assessment & Plan Note (Signed)
Diazepam prn  Potential benefits of a long term benzodiazepines  use as well as potential risks  and complications were explained to the patient and were aknowledged. 

## 2021-06-01 NOTE — Assessment & Plan Note (Signed)
On losartan and amlodipine

## 2021-06-01 NOTE — Progress Notes (Signed)
Subjective:  Patient ID: [name], female    DOB: [birth year]  Age: 80 y.o. MRN: [id number]  CC: Follow-up (3 month f/u- Flu shot)   HPI Nevaeha Finerty presents for OA/ LBP, anxiety, memory problems C/o frequent BMs. F/u insomnia  Outpatient Medications Prior to Visit  Medication Sig Dispense Refill   acetaminophen (TYLENOL) 500 MG tablet Take 1,000 mg by mouth every 6 (six) hours as needed.     amLODipine (NORVASC) 5 MG tablet TAKE ONE TABLET BY MOUTH DAILY 90 tablet 3   amoxicillin (AMOXIL) 500 MG capsule Take 4 capsules 1 hour prior to a dental cleaning, dental work 20 capsule 1   anastrozole (ARIMIDEX) 1 MG tablet Take 1 tablet (1 mg total) by mouth daily. 90 tablet 3   Ascorbic Acid (VITAMIN C PO) Take by mouth daily.     b complex vitamins tablet Take 1 tablet by mouth daily. 100 tablet 3   buPROPion (WELLBUTRIN XL) 150 MG 24 hr tablet Take 1 tablet (150 mg total) by mouth daily. 90 tablet 4   Cholecalciferol (VITAMIN D3) 50 MCG (2000 UT) capsule Take 2 capsules (4,000 Units total) by mouth daily. 100 capsule 3   diazepam (VALIUM) 5 MG tablet TAKE 1 TABLET BY MOUTH EVERY 12 HOURS AS NEEDED FOR ANXIETY OR MUSCLE SPASMS. 30 tablet 0   ibuprofen (ADVIL,MOTRIN) 400 MG tablet Take 400 mg by mouth every 6 (six) hours as needed.     loratadine (CLARITIN) 10 MG tablet Take 1 tablet (10 mg total) by mouth daily. 30 tablet 11   losartan (COZAAR) 100 MG tablet Take 1 tablet (100 mg total) by mouth daily. 90 tablet 3   zolpidem (AMBIEN CR) 6.25 MG CR tablet TAKE ONE TABLET BY MOUTH AT BEDTIME AS NEEDED FOR SLEEP 90 tablet 1   No facility-administered medications prior to visit.    ROS: Review of Systems  Constitutional:  Positive for fatigue. Negative for activity change, appetite change, chills and unexpected weight change.  HENT:  Negative for congestion, mouth sores and sinus pressure.   Eyes:  Negative for visual disturbance.  Respiratory:  Negative for  cough and chest tightness.   Gastrointestinal:  Negative for abdominal pain and nausea.  Genitourinary:  Negative for difficulty urinating, frequency and vaginal pain.  Musculoskeletal:  Positive for back pain. Negative for gait problem.  Skin:  Negative for pallor and rash.  Neurological:  Negative for dizziness, tremors, weakness, numbness and headaches.  Psychiatric/Behavioral:  Positive for decreased concentration, dysphoric mood and sleep disturbance. Negative for confusion and suicidal ideas. The patient is nervous/anxious.    Objective:  BP 138/68 (BP Location: Left Arm)   Pulse 87   Temp 98.3 F (36.8 C) (Oral)   Ht 5\' 3"  (1.6 m)   Wt 180 lb 12.8 oz (82 kg)   SpO2 97%   BMI 32.03 kg/m   BP Readings from Last 3 Encounters:  06/01/21 138/68  03/18/21 (!) 157/62  02/24/21 138/76    Wt Readings from Last 3 Encounters:  06/01/21 180 lb 12.8 oz (82 kg)  03/18/21 184 lb 11.2 oz (83.8 kg)  02/24/21 185 lb (83.9 kg)    Physical Exam Constitutional:      General: She is not in acute distress.    Appearance: She is well-developed. She is obese.  HENT:     Head: Normocephalic.     Right Ear: External ear normal.     Left Ear: External ear normal.  Nose: Nose normal.  Eyes:     General:        Right eye: No discharge.        Left eye: No discharge.     Conjunctiva/sclera: Conjunctivae normal.     Pupils: Pupils are equal, round, and reactive to light.  Neck:     Thyroid: No thyromegaly.     Vascular: No JVD.     Trachea: No tracheal deviation.  Cardiovascular:     Rate and Rhythm: Normal rate and regular rhythm.     Heart sounds: Normal heart sounds.  Pulmonary:     Effort: No respiratory distress.     Breath sounds: No stridor. No wheezing.  Abdominal:     General: Bowel sounds are normal. There is no distension.     Palpations: Abdomen is soft. There is no mass.     Tenderness: There is no abdominal tenderness. There is no guarding or rebound.   Musculoskeletal:        General: Tenderness present.     Cervical back: Normal range of motion and neck supple. No rigidity.  Lymphadenopathy:     Cervical: No cervical adenopathy.  Skin:    Findings: No erythema or rash.  Neurological:     Cranial Nerves: No cranial nerve deficit.     Motor: No abnormal muscle tone.     Coordination: Coordination normal.     Gait: Gait abnormal.     Deep Tendon Reflexes: Reflexes normal.  Psychiatric:        Behavior: Behavior normal.        Thought Content: Thought content normal.        Judgment: Judgment normal.  Antalgic gait LS is tender  Lab Results  Component Value Date   WBC 8.9 03/18/2021   HGB 12.8 03/18/2021   HCT 38.4 03/18/2021   PLT 346 03/18/2021   GLUCOSE 91 03/18/2021   CHOL 218 (H) 02/24/2021   TRIG 199.0 (H) 02/24/2021   HDL 52.00 02/24/2021   LDLDIRECT 161.3 06/17/2011   LDLCALC 126 (H) 02/24/2021   ALT 15 03/18/2021   AST 14 (L) 03/18/2021   NA 138 03/18/2021   K 4.2 03/18/2021   CL 104 03/18/2021   CREATININE 0.85 03/18/2021   BUN 10 03/18/2021   CO2 27 03/18/2021   TSH 2.20 02/24/2021   INR 1.06 06/12/2018    MM DIGITAL SCREENING BILATERAL  Result Date: 03/07/2021 CLINICAL DATA:  Screening. EXAM: DIGITAL SCREENING BILATERAL MAMMOGRAM WITH CAD TECHNIQUE: Bilateral screening digital craniocaudal and mediolateral oblique mammograms were obtained. The images were evaluated with computer-aided detection. COMPARISON:  Previous exam(s). ACR Breast Density Category a: The breast tissue is almost entirely fatty. FINDINGS: There are no findings suspicious for malignancy. IMPRESSION: No mammographic evidence of malignancy. A result letter of this screening mammogram will be mailed directly to the patient. RECOMMENDATION: Screening mammogram in one year. (Code:SM-B-01Y) BI-RADS CATEGORY  1: Negative. Electronically Signed   By: Ammie Ferrier M.D.   On: 03/07/2021 10:00    Assessment & Plan:   Problem List Items  Addressed This Visit     Anxiety disorder    Chronic, situational.  Continue with  Wellbutrin.  Diazepam as needed-rare use.  Potential benefits of a long term benzodiazepines  use as well as potential risks  and complications were explained to the patient and were aknowledged.      B12 deficiency    On Vit B12      Chronic low back pain  Norco prn  Potential benefits of a long term opioids use as well as potential risks (i.e. addiction risk, apnea etc) and complications (i.e. Somnolence, constipation and others) were explained to the patient and were aknowledged.      HTN (hypertension)    On losartan and amlodipine      Insomnia    Zolpidem prn  Potential benefits of a long term benzodiazepines  use as well as potential risks  and complications were explained to the patient and were aknowledged. The best we can do...      Memory impairment    Pt declined Aricept       Obesity    Pt lost wt      Situational mixed anxiety and depressive disorder    Diazepam prn  Potential benefits of a long term benzodiazepines  use as well as potential risks  and complications were explained to the patient and were aknowledged.       Vitamin D deficiency    On Vit D      Other Visit Diagnoses     Needs flu shot    -  Primary   Relevant Orders   Flu Vaccine QUAD High Dose(Fluad) (Completed)         No orders of the defined types were placed in this encounter.     Follow-up: Return in about 3 months (around 09/01/2021) for a follow-up visit.  [name], MD

## 2021-06-01 NOTE — Assessment & Plan Note (Signed)
On Vit B12 

## 2021-06-01 NOTE — Assessment & Plan Note (Signed)
Pt declined Aricept  

## 2021-06-01 NOTE — Assessment & Plan Note (Signed)
Pt lost wt 

## 2021-06-01 NOTE — Assessment & Plan Note (Signed)
Norco prn  Potential benefits of a long term opioids use as well as potential risks (i.e. addiction risk, apnea etc) and complications (i.e. Somnolence, constipation and others) were explained to the patient and were aknowledged. 

## 2021-06-01 NOTE — Assessment & Plan Note (Signed)
Zolpidem prn  Potential benefits of a long term benzodiazepines  use as well as potential risks  and complications were explained to the patient and were aknowledged. The best we can do.Marland KitchenMarland Kitchen

## 2021-06-01 NOTE — Assessment & Plan Note (Signed)
Chronic, situational.  Continue with  Wellbutrin.  Diazepam as needed-rare use.  Potential benefits of a long term benzodiazepines  use as well as potential risks  and complications were explained to the patient and were aknowledged. 

## 2021-06-01 NOTE — Assessment & Plan Note (Signed)
On Vit D 

## 2021-06-23 ENCOUNTER — Ambulatory Visit: Payer: Medicare Other | Admitting: Obstetrics and Gynecology

## 2021-06-29 ENCOUNTER — Ambulatory Visit (INDEPENDENT_AMBULATORY_CARE_PROVIDER_SITE_OTHER): Payer: Medicare Other | Admitting: Obstetrics & Gynecology

## 2021-06-29 ENCOUNTER — Other Ambulatory Visit: Payer: Self-pay

## 2021-06-29 ENCOUNTER — Encounter: Payer: Self-pay | Admitting: Obstetrics & Gynecology

## 2021-06-29 VITALS — BP 118/72 | HR 88 | Resp 16 | Ht 63.0 in | Wt 186.0 lb

## 2021-06-29 DIAGNOSIS — Z01419 Encounter for gynecological examination (general) (routine) without abnormal findings: Secondary | ICD-10-CM

## 2021-06-29 DIAGNOSIS — Z9189 Other specified personal risk factors, not elsewhere classified: Secondary | ICD-10-CM

## 2021-06-29 DIAGNOSIS — Z853 Personal history of malignant neoplasm of breast: Secondary | ICD-10-CM

## 2021-06-29 DIAGNOSIS — Z9289 Personal history of other medical treatment: Secondary | ICD-10-CM

## 2021-06-29 DIAGNOSIS — Z780 Asymptomatic menopausal state: Secondary | ICD-10-CM

## 2021-06-29 NOTE — Progress Notes (Signed)
Emmett Arntz April 12, 1941 [id number]   History:    80 y.o. G2P1L1A1  RP:  Established patient presenting for annual gyn exam   HPI: Postmenopausal. Prior supracervical hysterectomy.  No significant menopausal symptoms.  No vaginal bleeding. Pap smear 06/2016.  No significant history of abnormal Pap smears.  She is Breasts normal.  History of left breast cancer.  Mammogram Neg 03/2021.  Colonoscopy 2019.  DEXA 05/2019 obtained through Dr. Virgie Dad office showed BMD was normal. BMI 32.95.  Health labs with Fam MD.   Past medical history,surgical history, family history and social history were all reviewed and documented in the EPIC chart.  Gynecologic History No LMP recorded. Patient has had a hysterectomy.  Obstetric History OB History  Gravida Para Term Preterm AB Living  2 1     1 1   SAB IAB Ectopic Multiple Live Births    1          # Outcome Date GA Lbr Len/2nd Weight Sex Delivery Anes PTL Lv  2 IAB           1 Para     M Vag-Spont        ROS: A ROS was performed and pertinent positives and negatives are included in the history.  GENERAL: No fevers or chills. HEENT: No change in vision, no earache, sore throat or sinus congestion. NECK: No pain or stiffness. CARDIOVASCULAR: No chest pain or pressure. No palpitations. PULMONARY: No shortness of breath, cough or wheeze. GASTROINTESTINAL: No abdominal pain, nausea, vomiting or diarrhea, melena or bright red blood per rectum. GENITOURINARY: No urinary frequency, urgency, hesitancy or dysuria. MUSCULOSKELETAL: No joint or muscle pain, no back pain, no recent trauma. DERMATOLOGIC: No rash, no itching, no lesions. ENDOCRINE: No polyuria, polydipsia, no heat or cold intolerance. No recent change in weight. HEMATOLOGICAL: No anemia or easy bruising or bleeding. NEUROLOGIC: No headache, seizures, numbness, tingling or weakness. PSYCHIATRIC: No depression, no loss of interest in normal activity or change in sleep pattern.      Exam:   BP 118/72   Pulse 88   Resp 16   Ht 5\' 3"  (1.6 m)   Wt 186 lb (84.4 kg)   BMI 32.95 kg/m   Body mass index is 32.95 kg/m.  General appearance : Well developed well nourished female. No acute distress HEENT: Eyes: no retinal hemorrhage or exudates,  Neck supple, trachea midline, no carotid bruits, no thyroidmegaly Lungs: Clear to auscultation, no rhonchi or wheezes, or rib retractions  Heart: Regular rate and rhythm, no murmurs or gallops Breast:Examined in sitting and supine position were symmetrical in appearance, no palpable masses or tenderness,  no skin retraction, no nipple inversion, no nipple discharge, no skin discoloration, no axillary or supraclavicular lymphadenopathy Abdomen: no palpable masses or tenderness, no rebound or guarding Extremities: no edema or skin discoloration or tenderness  Pelvic: Vulva: Normal             Vagina: No gross lesions or discharge  Cervix: No gross lesions or discharge  Uterus  AV, normal size, shape and consistency, non-tender and mobile  Adnexa  Without masses or tenderness  Anus: Normal   Assessment/Plan:  80 y.o. female for annual exam   1. Well female exam with routine gynecological exam Postmenopausal. Prior supracervical hysterectomy.  No significant menopausal symptoms.  No vaginal bleeding. Pap smear 06/2016.  No significant history of abnormal Pap smears.  She is Breasts normal.  History of left breast cancer.  Mammogram Neg 03/2021.  Colonoscopy 2019.  DEXA 05/2019 obtained through Dr. Virgie Dad office showed BMD was normal. BMI 32.95.  Health labs with Fam MD.  2. At risk for breast cancer  3. Personal history of left breast cancer Mammo Neg 03/2021.  Followed by Dr Jana Hakim (Retiring).  4. Postmenopause Postmenopausal. Prior supracervical hysterectomy.  No significant menopausal symptoms.  No vaginal bleeding  5. Personal history of other medical treatment   Princess Bruins MD, 2:19 PM 06/29/2021

## 2021-09-29 ENCOUNTER — Ambulatory Visit: Payer: Medicare Other | Admitting: Internal Medicine

## 2021-09-30 ENCOUNTER — Encounter: Payer: Self-pay | Admitting: Internal Medicine

## 2021-09-30 ENCOUNTER — Other Ambulatory Visit: Payer: Self-pay

## 2021-09-30 ENCOUNTER — Ambulatory Visit (INDEPENDENT_AMBULATORY_CARE_PROVIDER_SITE_OTHER): Payer: Medicare Other | Admitting: Internal Medicine

## 2021-09-30 DIAGNOSIS — M5440 Lumbago with sciatica, unspecified side: Secondary | ICD-10-CM

## 2021-09-30 DIAGNOSIS — E538 Deficiency of other specified B group vitamins: Secondary | ICD-10-CM

## 2021-09-30 DIAGNOSIS — E6609 Other obesity due to excess calories: Secondary | ICD-10-CM

## 2021-09-30 DIAGNOSIS — I10 Essential (primary) hypertension: Secondary | ICD-10-CM | POA: Diagnosis not present

## 2021-09-30 DIAGNOSIS — F413 Other mixed anxiety disorders: Secondary | ICD-10-CM

## 2021-09-30 DIAGNOSIS — Z6834 Body mass index (BMI) 34.0-34.9, adult: Secondary | ICD-10-CM

## 2021-09-30 DIAGNOSIS — F419 Anxiety disorder, unspecified: Secondary | ICD-10-CM

## 2021-09-30 DIAGNOSIS — R413 Other amnesia: Secondary | ICD-10-CM

## 2021-09-30 DIAGNOSIS — G8929 Other chronic pain: Secondary | ICD-10-CM

## 2021-09-30 MED ORDER — DIAZEPAM 5 MG PO TABS: ORAL_TABLET | ORAL | 0 refills | Status: DC

## 2021-09-30 MED ORDER — AMOXICILLIN 500 MG PO CAPS: ORAL_CAPSULE | ORAL | 1 refills | Status: DC

## 2021-09-30 NOTE — Progress Notes (Signed)
? ?Subjective:  ?Patient ID: Marguerette Sheller, female    DOB: 09/30/1940  Age: 81 y.o. MRN: [id number] ? ?CC: Follow-up (Would like to discuss some pain she has been having) ? ? ?HPI ?Azuree Minish presents for anxiety, LBP, insomnia f/u ? ?Outpatient Medications Prior to Visit  ?Medication Sig Dispense Refill  ? acetaminophen (TYLENOL) 500 MG tablet Take 1,000 mg by mouth every 6 (six) hours as needed.    ? amLODipine (NORVASC) 5 MG tablet TAKE ONE TABLET BY MOUTH DAILY 90 tablet 3  ? amoxicillin (AMOXIL) 500 MG capsule Take 4 capsules 1 hour prior to a dental cleaning, dental work 20 capsule 1  ? anastrozole (ARIMIDEX) 1 MG tablet Take 1 tablet (1 mg total) by mouth daily. 90 tablet 3  ? Ascorbic Acid (VITAMIN C PO) Take by mouth daily.    ? b complex vitamins tablet Take 1 tablet by mouth daily. 100 tablet 3  ? buPROPion (WELLBUTRIN XL) 150 MG 24 hr tablet Take 1 tablet (150 mg total) by mouth daily. 90 tablet 4  ? Cholecalciferol (VITAMIN D3) 50 MCG (2000 UT) capsule Take 2 capsules (4,000 Units total) by mouth daily. 100 capsule 3  ? diazepam (VALIUM) 5 MG tablet TAKE 1 TABLET BY MOUTH EVERY 12 HOURS AS NEEDED FOR ANXIETY OR MUSCLE SPASMS. 30 tablet 0  ? ibuprofen (ADVIL,MOTRIN) 400 MG tablet Take 400 mg by mouth every 6 (six) hours as needed.    ? loratadine (CLARITIN) 10 MG tablet Take 1 tablet (10 mg total) by mouth daily. 30 tablet 11  ? losartan (COZAAR) 100 MG tablet Take 1 tablet (100 mg total) by mouth daily. 90 tablet 3  ? zolpidem (AMBIEN CR) 6.25 MG CR tablet TAKE ONE TABLET BY MOUTH AT BEDTIME AS NEEDED FOR SLEEP 90 tablet 1  ? ?No facility-administered medications prior to visit.  ? ? ?ROS: ?Review of Systems  ?Constitutional:  Negative for activity change, appetite change, chills, fatigue and unexpected weight change.  ?HENT:  Negative for congestion, mouth sores and sinus pressure.   ?Eyes:  Negative for visual disturbance.  ?Respiratory:  Negative for cough and chest tightness.    ?Gastrointestinal:  Negative for abdominal pain and nausea.  ?Genitourinary:  Negative for difficulty urinating, frequency and vaginal pain.  ?Musculoskeletal:  Positive for back pain. Negative for gait problem.  ?Skin:  Negative for pallor and rash.  ?Neurological:  Negative for dizziness, tremors, weakness, numbness and headaches.  ?Psychiatric/Behavioral:  Negative for confusion, sleep disturbance and suicidal ideas. The patient is nervous/anxious.   ? ?Objective:  ?BP 130/76 (BP Location: Right Arm, Patient Position: Sitting, Cuff Size: Large)   Pulse 97   Temp 98.1 ?F (36.7 ?C) (Oral)   Ht 5\' 3"  (1.6 m)   Wt 180 lb (81.6 kg)   SpO2 98%   BMI 31.89 kg/m?  ? ?BP Readings from Last 3 Encounters:  ?09/30/21 130/76  ?06/29/21 118/72  ?06/01/21 138/68  ? ? ?Wt Readings from Last 3 Encounters:  ?09/30/21 180 lb (81.6 kg)  ?06/29/21 186 lb (84.4 kg)  ?06/01/21 180 lb 12.8 oz (82 kg)  ? ? ?Physical Exam ?Constitutional:   ?   General: She is not in acute distress. ?   Appearance: She is well-developed. She is obese.  ?HENT:  ?   Head: Normocephalic.  ?   Right Ear: External ear normal.  ?   Left Ear: External ear normal.  ?   Nose: Nose normal.  ?Eyes:  ?  General:     ?   Right eye: No discharge.     ?   Left eye: No discharge.  ?   Conjunctiva/sclera: Conjunctivae normal.  ?   Pupils: Pupils are equal, round, and reactive to light.  ?Neck:  ?   Thyroid: No thyromegaly.  ?   Vascular: No JVD.  ?   Trachea: No tracheal deviation.  ?Cardiovascular:  ?   Rate and Rhythm: Normal rate and regular rhythm.  ?   Heart sounds: Normal heart sounds.  ?Pulmonary:  ?   Effort: No respiratory distress.  ?   Breath sounds: No stridor. No wheezing.  ?Abdominal:  ?   General: Bowel sounds are normal. There is no distension.  ?   Palpations: Abdomen is soft. There is no mass.  ?   Tenderness: There is no abdominal tenderness. There is no guarding or rebound.  ?Musculoskeletal:     ?   General: Tenderness present.  ?   Cervical  back: Normal range of motion and neck supple. No rigidity.  ?Lymphadenopathy:  ?   Cervical: No cervical adenopathy.  ?Skin: ?   Findings: No erythema or rash.  ?Neurological:  ?   Mental Status: She is oriented to person, place, and time.  ?   Cranial Nerves: No cranial nerve deficit.  ?   Motor: No abnormal muscle tone.  ?   Coordination: Coordination normal.  ?   Gait: Gait abnormal.  ?   Deep Tendon Reflexes: Reflexes normal.  ?Psychiatric:     ?   Behavior: Behavior normal.     ?   Thought Content: Thought content normal.     ?   Judgment: Judgment normal.  ? ?Antalgic gait ?Ls - tender ? ?Lab Results  ?Component Value Date  ? WBC 8.9 03/18/2021  ? HGB 12.8 03/18/2021  ? HCT 38.4 03/18/2021  ? PLT 346 03/18/2021  ? GLUCOSE 91 03/18/2021  ? CHOL 218 (H) 02/24/2021  ? TRIG 199.0 (H) 02/24/2021  ? HDL 52.00 02/24/2021  ? LDLDIRECT 161.3 06/17/2011  ? LDLCALC 126 (H) 02/24/2021  ? ALT 15 03/18/2021  ? AST 14 (L) 03/18/2021  ? NA 138 03/18/2021  ? K 4.2 03/18/2021  ? CL 104 03/18/2021  ? CREATININE 0.85 03/18/2021  ? BUN 10 03/18/2021  ? CO2 27 03/18/2021  ? TSH 2.20 02/24/2021  ? INR 1.06 06/12/2018  ? ? ?MM DIGITAL SCREENING BILATERAL ? ?Result Date: 03/07/2021 ?CLINICAL DATA:  Screening. EXAM: DIGITAL SCREENING BILATERAL MAMMOGRAM WITH CAD TECHNIQUE: Bilateral screening digital craniocaudal and mediolateral oblique mammograms were obtained. The images were evaluated with computer-aided detection. COMPARISON:  Previous exam(s). ACR Breast Density Category a: The breast tissue is almost entirely fatty. FINDINGS: There are no findings suspicious for malignancy. IMPRESSION: No mammographic evidence of malignancy. A result letter of this screening mammogram will be mailed directly to the patient. RECOMMENDATION: Screening mammogram in one year. (Code:SM-B-01Y) BI-RADS CATEGORY  1: Negative. Electronically Signed   By: Ammie Ferrier M.D.   On: 03/07/2021 10:00  ? ? ?Assessment & Plan:  ? ?Problem List Items Addressed  This Visit   ? ? Anxiety disorder  ?  Chronic, situational.  Continue with  Wellbutrin.  Diazepam as needed-rare use. ? Potential benefits of a long term benzodiazepines  use as well as potential risks  and complications were explained to the patient and were aknowledged. ?  ?  ? B12 deficiency  ?  Cont on B12 ?  ?  ?  Chronic low back pain  ?  Norco prn ? Potential benefits of a long term opioids use as well as potential risks (i.e. addiction risk, apnea etc) and complications (i.e. Somnolence, constipation and others) were explained to the patient and were aknowledged. ?  ?  ? HTN (hypertension)  ?  On losartan and amlodipine ?  ?  ? Obesity  ?  Wt Readings from Last 3 Encounters:  ?09/30/21 180 lb (81.6 kg)  ?06/29/21 186 lb (84.4 kg)  ?06/01/21 180 lb 12.8 oz (82 kg)  ? ?  ?  ?  ? ? ?No orders of the defined types were placed in this encounter. ?  ? ? ?Follow-up: Return in about 4 months (around 01/30/2022) for a follow-up visit. ? ?[name], MD ?

## 2021-09-30 NOTE — Assessment & Plan Note (Signed)
On losartan and amlodipine ?

## 2021-09-30 NOTE — Assessment & Plan Note (Signed)
Stable

## 2021-09-30 NOTE — Assessment & Plan Note (Signed)
Cont on B12 ?

## 2021-09-30 NOTE — Assessment & Plan Note (Signed)
Wt Readings from Last 3 Encounters:  ?09/30/21 180 lb (81.6 kg)  ?06/29/21 186 lb (84.4 kg)  ?06/01/21 180 lb 12.8 oz (82 kg)  ? ? ?

## 2021-09-30 NOTE — Assessment & Plan Note (Addendum)
Norco prn - pt stopped ?Diazepm prn spasms ? Potential benefits of a long term benzodiazepines  use as well as potential risks  and complications were explained to the patient and were aknowledged. ?

## 2021-09-30 NOTE — Assessment & Plan Note (Signed)
Chronic, situational.  Continue with  Wellbutrin.  Diazepam as needed-rare use.  Potential benefits of a long term benzodiazepines  use as well as potential risks  and complications were explained to the patient and were aknowledged. 

## 2021-11-29 ENCOUNTER — Other Ambulatory Visit: Payer: Self-pay | Admitting: Internal Medicine

## 2021-11-30 NOTE — Telephone Encounter (Signed)
Check [place] registry last filled 08/30/2021.Marland KitchenJohny Chess ? ?

## 2021-12-01 ENCOUNTER — Telehealth: Payer: Self-pay | Admitting: Internal Medicine

## 2021-12-01 NOTE — Telephone Encounter (Signed)
1.Medication Requested: ?zolpidem (AMBIEN CR) 6.25 MG CR tablet ?2. Pharmacy (Name, Street, [place]): ?[id number] - [address], [place] Phone:  [phone]  ?Fax:  [phone]  ?  ? ?3. On Med List: yes ? ?4. Last Visit with PCP: ? ?5. Next visit date with PCP: ? ?Pt is completely out of medication. Requesting that it be called in ASAP. ? ?Agent: Please be advised that RX refills may take up to 3 business days. We ask that you follow-up with your pharmacy.  ?

## 2021-12-02 ENCOUNTER — Other Ambulatory Visit: Payer: Self-pay | Admitting: Internal Medicine

## 2021-12-02 DIAGNOSIS — F419 Anxiety disorder, unspecified: Secondary | ICD-10-CM

## 2021-12-02 MED ORDER — ZOLPIDEM TARTRATE ER 6.25 MG PO TBCR: 6.2500 mg | EXTENDED_RELEASE_TABLET | Freq: Every evening | ORAL | 1 refills | Status: DC | PRN

## 2021-12-02 NOTE — Telephone Encounter (Signed)
Okay.  Thanks.

## 2021-12-02 NOTE — Telephone Encounter (Signed)
MD sent refill pof.../lmb ?

## 2021-12-02 NOTE — Telephone Encounter (Signed)
To PCP please ?

## 2021-12-06 ENCOUNTER — Telehealth: Payer: Self-pay | Admitting: Internal Medicine

## 2021-12-06 DIAGNOSIS — F419 Anxiety disorder, unspecified: Secondary | ICD-10-CM

## 2021-12-06 NOTE — Telephone Encounter (Signed)
Patient needs a refill on her diazapam - [facility] - [place] ?

## 2021-12-06 NOTE — Telephone Encounter (Signed)
Check [place] registry last filled 09/30/2021.Pls advise.Marland KitchenJohny Chess ? ?

## 2021-12-07 MED ORDER — DIAZEPAM 5 MG PO TABS: ORAL_TABLET | ORAL | 3 refills | Status: DC

## 2021-12-07 NOTE — Telephone Encounter (Signed)
Okay.  Done.  Thanks 

## 2021-12-20 ENCOUNTER — Ambulatory Visit (INDEPENDENT_AMBULATORY_CARE_PROVIDER_SITE_OTHER): Payer: Medicare Other

## 2021-12-20 DIAGNOSIS — Z0000 Encounter for general adult medical examination without abnormal findings: Secondary | ICD-10-CM | POA: Diagnosis not present
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
## 2021-12-20 NOTE — Patient Instructions (Signed)
[name] , Thank you for taking time to come for your Medicare Wellness Visit. I appreciate your ongoing commitment to your health goals. Please review the following plan we discussed and let me know if I can assist you in the future.   Screening recommendations/referrals: Colonoscopy: no longer required  Mammogram: no longer required  Bone Density: 05/16/2019 Recommended yearly ophthalmology/optometry visit for glaucoma screening and checkup Recommended yearly dental visit for hygiene and checkup  Vaccinations: Influenza vaccine: completed  Pneumococcal vaccine: completed  Tdap vaccine: due  Shingles vaccine: declined     Advanced directives: yes   Conditions/risks identified: none   Next appointment: none    Preventive Care 82 Years and Older, Female Preventive care refers to lifestyle choices and visits with your health care provider that can promote health and wellness. What does preventive care include? A yearly physical exam. This is also called an annual well check. Dental exams once or twice a year. Routine eye exams. Ask your health care provider how often you should have your eyes checked. Personal lifestyle choices, including: Daily care of your teeth and gums. Regular physical activity. Eating a healthy diet. Avoiding tobacco and drug use. Limiting alcohol use. Practicing safe sex. Taking low-dose aspirin every day. Taking vitamin and mineral supplements as recommended by your health care provider. What happens during an annual well check? The services and screenings done by your health care provider during your annual well check will depend on your age, overall health, lifestyle risk factors, and family history of disease. Counseling  Your health care provider may ask you questions about your: Alcohol use. Tobacco use. Drug use. Emotional well-being. Home and relationship well-being. Sexual activity. Eating habits. History of falls. Memory and  ability to understand (cognition). Work and work Statistician. Reproductive health. Screening  You may have the following tests or measurements: Height, weight, and BMI. Blood pressure. Lipid and cholesterol levels. These may be checked every 5 years, or more frequently if you are over 2 years old. Skin check. Lung cancer screening. You may have this screening every year starting at age 34 if you have a 30-pack-year history of smoking and currently smoke or have quit within the past 15 years. Fecal occult blood test (FOBT) of the stool. You may have this test every year starting at age 74. Flexible sigmoidoscopy or colonoscopy. You may have a sigmoidoscopy every 5 years or a colonoscopy every 10 years starting at age 43. Hepatitis C blood test. Hepatitis B blood test. Sexually transmitted disease (STD) testing. Diabetes screening. This is done by checking your blood sugar (glucose) after you have not eaten for a while (fasting). You may have this done every 1-3 years. Bone density scan. This is done to screen for osteoporosis. You may have this done starting at age 69. Mammogram. This may be done every 1-2 years. Talk to your health care provider about how often you should have regular mammograms. Talk with your health care provider about your test results, treatment options, and if necessary, the need for more tests. Vaccines  Your health care provider may recommend certain vaccines, such as: Influenza vaccine. This is recommended every year. Tetanus, diphtheria, and acellular pertussis (Tdap, Td) vaccine. You may need a Td booster every 10 years. Zoster vaccine. You may need this after age 30. Pneumococcal 13-valent conjugate (PCV13) vaccine. One dose is recommended after age 39. Pneumococcal polysaccharide (PPSV23) vaccine. One dose is recommended after age 65. Talk to your health care provider about which screenings and  vaccines you need and how often you need them. This information is  not intended to replace advice given to you by your health care provider. Make sure you discuss any questions you have with your health care provider. Document Released: 08/14/2015 Document Revised: 04/06/2016 Document Reviewed: 05/19/2015 Elsevier Interactive Patient Education  2017 [place] Prevention in the Home Falls can cause injuries. They can happen to people of all ages. There are many things you can do to make your home safe and to help prevent falls. What can I do on the outside of my home? Regularly fix the edges of walkways and driveways and fix any cracks. Remove anything that might make you trip as you walk through a door, such as a raised step or threshold. Trim any bushes or trees on the path to your home. Use bright outdoor lighting. Clear any walking paths of anything that might make someone trip, such as rocks or tools. Regularly check to see if handrails are loose or broken. Make sure that both sides of any steps have handrails. Any raised decks and porches should have guardrails on the edges. Have any leaves, snow, or ice cleared regularly. Use sand or salt on walking paths during winter. Clean up any spills in your garage right away. This includes oil or grease spills. What can I do in the bathroom? Use night lights. Install grab bars by the toilet and in the tub and shower. Do not use towel bars as grab bars. Use non-skid mats or decals in the tub or shower. If you need to sit down in the shower, use a plastic, non-slip stool. Keep the floor dry. Clean up any water that spills on the floor as soon as it happens. Remove soap buildup in the tub or shower regularly. Attach bath mats securely with double-sided non-slip rug tape. Do not have throw rugs and other things on the floor that can make you trip. What can I do in the bedroom? Use night lights. Make sure that you have a light by your bed that is easy to reach. Do not use any sheets or blankets that  are too big for your bed. They should not hang down onto the floor. Have a firm chair that has side arms. You can use this for support while you get dressed. Do not have throw rugs and other things on the floor that can make you trip. What can I do in the kitchen? Clean up any spills right away. Avoid walking on wet floors. Keep items that you use a lot in easy-to-reach places. If you need to reach something above you, use a strong step stool that has a grab bar. Keep electrical cords out of the way. Do not use floor polish or wax that makes floors slippery. If you must use wax, use non-skid floor wax. Do not have throw rugs and other things on the floor that can make you trip. What can I do with my stairs? Do not leave any items on the stairs. Make sure that there are handrails on both sides of the stairs and use them. Fix handrails that are broken or loose. Make sure that handrails are as long as the stairways. Check any carpeting to make sure that it is firmly attached to the stairs. Fix any carpet that is loose or worn. Avoid having throw rugs at the top or bottom of the stairs. If you do have throw rugs, attach them to the floor with carpet tape. Make  sure that you have a light switch at the top of the stairs and the bottom of the stairs. If you do not have them, ask someone to add them for you. What else can I do to help prevent falls? Wear shoes that: Do not have high heels. Have rubber bottoms. Are comfortable and fit you well. Are closed at the toe. Do not wear sandals. If you use a stepladder: Make sure that it is fully opened. Do not climb a closed stepladder. Make sure that both sides of the stepladder are locked into place. Ask someone to hold it for you, if possible. Clearly mark and make sure that you can see: Any grab bars or handrails. First and last steps. Where the edge of each step is. Use tools that help you move around (mobility aids) if they are needed. These  include: Canes. Walkers. Scooters. Crutches. Turn on the lights when you go into a dark area. Replace any light bulbs as soon as they burn out. Set up your furniture so you have a clear path. Avoid moving your furniture around. If any of your floors are uneven, fix them. If there are any pets around you, be aware of where they are. Review your medicines with your doctor. Some medicines can make you feel dizzy. This can increase your chance of falling. Ask your doctor what other things that you can do to help prevent falls. This information is not intended to replace advice given to you by your health care provider. Make sure you discuss any questions you have with your health care provider. Document Released: 05/14/2009 Document Revised: 12/24/2015 Document Reviewed: 08/22/2014 Elsevier Interactive Patient Education  2017 [employer].

## 2021-12-28 ENCOUNTER — Other Ambulatory Visit (HOSPITAL_BASED_OUTPATIENT_CLINIC_OR_DEPARTMENT_OTHER): Payer: Self-pay

## 2022-01-19 ENCOUNTER — Other Ambulatory Visit: Payer: Self-pay | Admitting: Hematology and Oncology

## 2022-01-19 DIAGNOSIS — Z1231 Encounter for screening mammogram for malignant neoplasm of breast: Secondary | ICD-10-CM

## 2022-01-24 ENCOUNTER — Other Ambulatory Visit: Payer: Self-pay | Admitting: Internal Medicine

## 2022-02-03 ENCOUNTER — Encounter: Payer: Self-pay | Admitting: Internal Medicine

## 2022-02-03 ENCOUNTER — Ambulatory Visit (INDEPENDENT_AMBULATORY_CARE_PROVIDER_SITE_OTHER): Payer: Medicare Other | Admitting: Internal Medicine

## 2022-02-03 VITALS — BP 120/80 | HR 106 | Temp 98.4°F | Ht 63.0 in | Wt 182.0 lb

## 2022-02-03 DIAGNOSIS — Z170 Estrogen receptor positive status [ER+]: Secondary | ICD-10-CM

## 2022-02-03 DIAGNOSIS — G8929 Other chronic pain: Secondary | ICD-10-CM

## 2022-02-03 DIAGNOSIS — E785 Hyperlipidemia, unspecified: Secondary | ICD-10-CM

## 2022-02-03 DIAGNOSIS — M5440 Lumbago with sciatica, unspecified side: Secondary | ICD-10-CM

## 2022-02-03 DIAGNOSIS — R413 Other amnesia: Secondary | ICD-10-CM

## 2022-02-03 DIAGNOSIS — C50412 Malignant neoplasm of upper-outer quadrant of left female breast: Secondary | ICD-10-CM

## 2022-02-03 DIAGNOSIS — R202 Paresthesia of skin: Secondary | ICD-10-CM

## 2022-02-03 DIAGNOSIS — F413 Other mixed anxiety disorders: Secondary | ICD-10-CM

## 2022-02-03 DIAGNOSIS — M1990 Unspecified osteoarthritis, unspecified site: Secondary | ICD-10-CM | POA: Diagnosis not present

## 2022-02-03 DIAGNOSIS — H9313 Tinnitus, bilateral: Secondary | ICD-10-CM

## 2022-02-03 DIAGNOSIS — Z0000 Encounter for general adult medical examination without abnormal findings: Secondary | ICD-10-CM

## 2022-02-03 DIAGNOSIS — G4700 Insomnia, unspecified: Secondary | ICD-10-CM

## 2022-02-03 NOTE — Assessment & Plan Note (Addendum)
Chronic LBP, hip pains, knees  Norco prn - pt stopped  Potential benefits of a long term opioids use as well as potential risks (i.e. addiction risk, apnea etc) and complications (i.e. Somnolence, constipation and others) were explained to the patient and were aknowledged. Diazepm prn spasms  Potential benefits of a long term benzodiazepines  use as well as potential risks  and complications were explained to the patient and were aknowledged.

## 2022-02-03 NOTE — Assessment & Plan Note (Signed)
On diet Declined statins

## 2022-02-03 NOTE — Assessment & Plan Note (Signed)
On Zolpidem CR to 6.25 mg prn  Potential benefits of a long term benzodiazepines  use as well as potential risks  and complications were explained to the patient and were aknowledged.

## 2022-02-03 NOTE — Assessment & Plan Note (Signed)
Worse. Neurology ref was offered again Pt declined Aricept

## 2022-02-03 NOTE — Assessment & Plan Note (Signed)
Chronic, situational.  Continue with  Wellbutrin.  Diazepam as needed-rare use.  Potential benefits of a long term benzodiazepines  use as well as potential risks  and complications were explained to the patient and were aknowledged.

## 2022-02-03 NOTE — Assessment & Plan Note (Signed)
ENT ref was offered

## 2022-02-03 NOTE — Assessment & Plan Note (Signed)
Chronic Norco prn - pt stopped  Potential benefits of a long term opioids use as well as potential risks (i.e. addiction risk, apnea etc) and complications (i.e. Somnolence, constipation and others) were explained to the patient and were aknowledged. Diazepm prn spasms  Potential benefits of a long term benzodiazepines  use as well as potential risks  and complications were explained to the patient and were aknowledged.

## 2022-02-03 NOTE — Assessment & Plan Note (Signed)
On Arimidex - in her last year of using it

## 2022-02-03 NOTE — Progress Notes (Signed)
Subjective:  Patient ID: [name], female    DOB: Dec 02, 1940  Age: 81 y.o. MRN: [id number]  CC: No chief complaint on file.   HPI [name] presents for OA, LBP, memory issues  Outpatient Medications Prior to Visit  Medication Sig Dispense Refill   acetaminophen (TYLENOL) 500 MG tablet Take 1,000 mg by mouth every 6 (six) hours as needed.     amLODipine (NORVASC) 5 MG tablet Take 1 tablet (5 mg total) by mouth daily. Annual appt is due must see provider for future refills 90 tablet 0   amoxicillin (AMOXIL) 500 MG capsule Take 4 capsules 1 hour prior to a dental cleaning, dental work 20 capsule 1   anastrozole (ARIMIDEX) 1 MG tablet Take 1 tablet (1 mg total) by mouth daily. 90 tablet 3   Ascorbic Acid (VITAMIN C PO) Take by mouth daily.     b complex vitamins tablet Take 1 tablet by mouth daily. 100 tablet 3   buPROPion (WELLBUTRIN XL) 150 MG 24 hr tablet Take 1 tablet (150 mg total) by mouth daily. 90 tablet 4   Cholecalciferol (VITAMIN D3) 50 MCG (2000 UT) capsule Take 2 capsules (4,000 Units total) by mouth daily. 100 capsule 3   diazepam (VALIUM) 5 MG tablet TAKE 1 TABLET BY MOUTH EVERY 12 HOURS AS NEEDED FOR ANXIETY OR MUSCLE SPASMS. 30 tablet 3   ibuprofen (ADVIL,MOTRIN) 400 MG tablet Take 400 mg by mouth every 6 (six) hours as needed.     loratadine (CLARITIN) 10 MG tablet Take 1 tablet (10 mg total) by mouth daily. 30 tablet 11   losartan (COZAAR) 100 MG tablet Take 1 tablet (100 mg total) by mouth daily. Annual appt is due must see provider for future refills 90 tablet 0   zolpidem (AMBIEN CR) 6.25 MG CR tablet Take 1 tablet (6.25 mg total) by mouth at bedtime as needed. for sleep 90 tablet 1   No facility-administered medications prior to visit.    ROS: Review of Systems  Constitutional:  Negative for activity change, appetite change, chills, fatigue and unexpected weight change.  HENT:  Negative for congestion, mouth sores and sinus pressure.    Eyes:  Negative for visual disturbance.  Respiratory:  Negative for cough and chest tightness.   Gastrointestinal:  Negative for abdominal pain and nausea.  Genitourinary:  Negative for difficulty urinating, frequency and vaginal pain.  Musculoskeletal:  Positive for arthralgias and back pain. Negative for gait problem.  Skin:  Negative for pallor and rash.  Neurological:  Negative for dizziness, tremors, weakness, numbness and headaches.  Psychiatric/Behavioral:  Positive for confusion and decreased concentration. Negative for dysphoric mood, sleep disturbance and suicidal ideas. The patient is nervous/anxious.     Objective:  BP 120/80 (BP Location: Left Arm, Patient Position: Sitting, Cuff Size: Large)   Pulse (!) 106   Temp 98.4 F (36.9 C) (Oral)   Ht '5\' 3"'$  (1.6 m)   Wt 182 lb (82.6 kg)   SpO2 96%   BMI 32.24 kg/m   BP Readings from Last 3 Encounters:  02/03/22 120/80  09/30/21 130/76  06/29/21 118/72    Wt Readings from Last 3 Encounters:  02/03/22 182 lb (82.6 kg)  09/30/21 180 lb (81.6 kg)  06/29/21 186 lb (84.4 kg)    Physical Exam Constitutional:      General: She is not in acute distress.    Appearance: Normal appearance. She is well-developed. She is obese.  HENT:     Head:  Normocephalic.     Right Ear: External ear normal.     Left Ear: External ear normal.     Nose: Nose normal.  Eyes:     General:        Right eye: No discharge.        Left eye: No discharge.     Conjunctiva/sclera: Conjunctivae normal.     Pupils: Pupils are equal, round, and reactive to light.  Neck:     Thyroid: No thyromegaly.     Vascular: No JVD.     Trachea: No tracheal deviation.  Cardiovascular:     Rate and Rhythm: Normal rate and regular rhythm.     Heart sounds: Normal heart sounds.  Pulmonary:     Effort: No respiratory distress.     Breath sounds: No stridor. No wheezing.  Abdominal:     General: Bowel sounds are normal. There is no distension.      Palpations: Abdomen is soft. There is no mass.     Tenderness: There is no abdominal tenderness. There is no guarding or rebound.  Musculoskeletal:        General: No tenderness.     Cervical back: Normal range of motion and neck supple. No rigidity.  Lymphadenopathy:     Cervical: No cervical adenopathy.  Skin:    Findings: No erythema or rash.  Neurological:     Cranial Nerves: No cranial nerve deficit.     Motor: No abnormal muscle tone.     Coordination: Coordination normal.     Deep Tendon Reflexes: Reflexes normal.  Psychiatric:        Behavior: Behavior normal.        Thought Content: Thought content normal.        Judgment: Judgment normal.     Lab Results  Component Value Date   WBC 8.9 03/18/2021   HGB 12.8 03/18/2021   HCT 38.4 03/18/2021   PLT 346 03/18/2021   GLUCOSE 91 03/18/2021   CHOL 218 (H) 02/24/2021   TRIG 199.0 (H) 02/24/2021   HDL 52.00 02/24/2021   LDLDIRECT 161.3 06/17/2011   LDLCALC 126 (H) 02/24/2021   ALT 15 03/18/2021   AST 14 (L) 03/18/2021   NA 138 03/18/2021   K 4.2 03/18/2021   CL 104 03/18/2021   CREATININE 0.85 03/18/2021   BUN 10 03/18/2021   CO2 27 03/18/2021   TSH 2.20 02/24/2021   INR 1.06 06/12/2018    MM DIGITAL SCREENING BILATERAL  Result Date: 03/07/2021 CLINICAL DATA:  Screening. EXAM: DIGITAL SCREENING BILATERAL MAMMOGRAM WITH CAD TECHNIQUE: Bilateral screening digital craniocaudal and mediolateral oblique mammograms were obtained. The images were evaluated with computer-aided detection. COMPARISON:  Previous exam(s). ACR Breast Density Category a: The breast tissue is almost entirely fatty. FINDINGS: There are no findings suspicious for malignancy. IMPRESSION: No mammographic evidence of malignancy. A result letter of this screening mammogram will be mailed directly to the patient. RECOMMENDATION: Screening mammogram in one year. (Code:[id number]-01Y) BI-RADS CATEGORY  1: Negative. Electronically Signed   By: Ammie Ferrier M.D.    On: 03/07/2021 10:00    Assessment & Plan:   Problem List Items Addressed This Visit     Anxiety disorder    Chronic, situational.  Continue with  Wellbutrin.  Diazepam as needed-rare use.  Potential benefits of a long term benzodiazepines  use as well as potential risks  and complications were explained to the patient and were aknowledged.      Relevant Orders   CBC  with Differential/Platelet   Chronic low back pain    Chronic Norco prn - pt stopped  Potential benefits of a long term opioids use as well as potential risks (i.e. addiction risk, apnea etc) and complications (i.e. Somnolence, constipation and others) were explained to the patient and were aknowledged. Diazepm prn spasms  Potential benefits of a long term benzodiazepines  use as well as potential risks  and complications were explained to the patient and were aknowledged.      Dyslipidemia    On diet Declined statins      Relevant Orders   TSH   Lipid panel   Insomnia     On Zolpidem CR to 6.25 mg prn  Potential benefits of a long term benzodiazepines  use as well as potential risks  and complications were explained to the patient and were aknowledged.       Malignant neoplasm of upper-outer quadrant of left breast in female, estrogen receptor positive ([place])     On Arimidex - in her last year of using it      Memory impairment - Primary    Worse. Neurology ref was offered again Pt declined Aricept       Relevant Orders   Ambulatory referral to Neurology   CBC with Differential/Platelet   Vitamin B12   Osteoarthritis    Chronic LBP, hip pains, knees  Norco prn - pt stopped  Potential benefits of a long term opioids use as well as potential risks (i.e. addiction risk, apnea etc) and complications (i.e. Somnolence, constipation and others) were explained to the patient and were aknowledged. Diazepm prn spasms  Potential benefits of a long term benzodiazepines  use as well as potential risks  and  complications were explained to the patient and were aknowledged.      Tinnitus    ENT ref was offered      Well adult exam   Relevant Orders   TSH   Urinalysis   CBC with Differential/Platelet   Lipid panel   Comprehensive metabolic panel   Vitamin Z61   Other Visit Diagnoses     Paresthesia       Relevant Orders   TSH   CBC with Differential/Platelet   Vitamin B12         No orders of the defined types were placed in this encounter.     Follow-up: Return in about 3 months (around 05/06/2022) for Wellness Exam.  [name], MD

## 2022-02-24 ENCOUNTER — Other Ambulatory Visit: Payer: Self-pay | Admitting: *Deleted

## 2022-02-24 DIAGNOSIS — F339 Major depressive disorder, recurrent, unspecified: Secondary | ICD-10-CM

## 2022-02-24 MED ORDER — BUPROPION HCL ER (XL) 150 MG PO TB24: 150.0000 mg | ORAL_TABLET | Freq: Every day | ORAL | 4 refills | Status: DC

## 2022-02-28 ENCOUNTER — Other Ambulatory Visit: Payer: Self-pay | Admitting: *Deleted

## 2022-02-28 DIAGNOSIS — F339 Major depressive disorder, recurrent, unspecified: Secondary | ICD-10-CM

## 2022-02-28 MED ORDER — BUPROPION HCL ER (XL) 150 MG PO TB24: 150.0000 mg | ORAL_TABLET | Freq: Every day | ORAL | 4 refills | Status: DC

## 2022-03-18 ENCOUNTER — Ambulatory Visit
Admission: RE | Admit: 2022-03-18 | Discharge: 2022-03-18 | Disposition: A | Discharge status: Home / Self Care | Payer: Medicare Other | Source: Ambulatory Visit | Attending: Hematology and Oncology | Admitting: Hematology and Oncology

## 2022-03-18 DIAGNOSIS — Z1231 Encounter for screening mammogram for malignant neoplasm of breast: Secondary | ICD-10-CM

## 2022-03-21 ENCOUNTER — Other Ambulatory Visit: Payer: Self-pay | Admitting: Hematology and Oncology

## 2022-03-21 DIAGNOSIS — R928 Other abnormal and inconclusive findings on diagnostic imaging of breast: Secondary | ICD-10-CM

## 2022-03-24 ENCOUNTER — Ambulatory Visit: Payer: Medicare Other | Admitting: Hematology and Oncology

## 2022-03-24 ENCOUNTER — Other Ambulatory Visit: Payer: Medicare Other

## 2022-03-29 ENCOUNTER — Other Ambulatory Visit: Payer: Self-pay | Admitting: *Deleted

## 2022-03-29 DIAGNOSIS — Z170 Estrogen receptor positive status [ER+]: Secondary | ICD-10-CM

## 2022-03-30 ENCOUNTER — Other Ambulatory Visit: Payer: Self-pay

## 2022-03-30 ENCOUNTER — Inpatient Hospital Stay (HOSPITAL_BASED_OUTPATIENT_CLINIC_OR_DEPARTMENT_OTHER): Payer: Medicare Other | Admitting: Hematology and Oncology

## 2022-03-30 ENCOUNTER — Inpatient Hospital Stay: Payer: Medicare Other | Attending: Hematology and Oncology

## 2022-03-30 ENCOUNTER — Encounter: Payer: Self-pay | Admitting: Hematology and Oncology

## 2022-03-30 VITALS — BP 135/74 | HR 67 | Temp 97.7°F | Resp 16 | Ht 63.0 in | Wt 185.3 lb

## 2022-03-30 DIAGNOSIS — M8580 Other specified disorders of bone density and structure, unspecified site: Secondary | ICD-10-CM | POA: Diagnosis present

## 2022-03-30 DIAGNOSIS — Z170 Estrogen receptor positive status [ER+]: Secondary | ICD-10-CM

## 2022-03-30 DIAGNOSIS — Z853 Personal history of malignant neoplasm of breast: Secondary | ICD-10-CM | POA: Insufficient documentation

## 2022-03-30 DIAGNOSIS — Z87891 Personal history of nicotine dependence: Secondary | ICD-10-CM | POA: Insufficient documentation

## 2022-03-30 DIAGNOSIS — Z808 Family history of malignant neoplasm of other organs or systems: Secondary | ICD-10-CM | POA: Insufficient documentation

## 2022-03-30 DIAGNOSIS — C50412 Malignant neoplasm of upper-outer quadrant of left female breast: Secondary | ICD-10-CM | POA: Diagnosis not present

## 2022-03-30 DIAGNOSIS — F419 Anxiety disorder, unspecified: Secondary | ICD-10-CM | POA: Diagnosis not present

## 2022-03-30 DIAGNOSIS — Z79811 Long term (current) use of aromatase inhibitors: Secondary | ICD-10-CM | POA: Insufficient documentation

## 2022-03-30 DIAGNOSIS — Z90710 Acquired absence of both cervix and uterus: Secondary | ICD-10-CM | POA: Insufficient documentation

## 2022-03-30 LAB — CBC WITH DIFFERENTIAL (CANCER CENTER ONLY)
Abs Immature Granulocytes: 0.02 10*3/uL (ref 0.00–0.07)
Basophils Absolute: 0 10*3/uL (ref 0.0–0.1)
Basophils Relative: 0 %
Eosinophils Absolute: 0.1 10*3/uL (ref 0.0–0.5)
Eosinophils Relative: 2 %
HCT: 40.2 % (ref 36.0–46.0)
Hemoglobin: 13.8 g/dL (ref 12.0–15.0)
Immature Granulocytes: 0 %
Lymphocytes Relative: 22 %
Lymphs Abs: 1.7 10*3/uL (ref 0.7–4.0)
MCH: 32.5 pg (ref 26.0–34.0)
MCHC: 34.3 g/dL (ref 30.0–36.0)
MCV: 94.8 fL (ref 80.0–100.0)
Monocytes Absolute: 0.8 10*3/uL (ref 0.1–1.0)
Monocytes Relative: 10 %
Neutro Abs: 5.4 10*3/uL (ref 1.7–7.7)
Neutrophils Relative %: 66 %
Platelet Count: 326 10*3/uL (ref 150–400)
RBC: 4.24 MIL/uL (ref 3.87–5.11)
RDW: 12.6 % (ref 11.5–15.5)
WBC Count: 8.1 10*3/uL (ref 4.0–10.5)
nRBC: 0 % (ref 0.0–0.2)

## 2022-03-30 LAB — CMP (CANCER CENTER ONLY)
ALT: 20 U/L (ref 0–44)
AST: 21 U/L (ref 15–41)
Albumin: 4.1 g/dL (ref 3.5–5.0)
Alkaline Phosphatase: 78 U/L (ref 38–126)
Anion gap: 10 (ref 5–15)
BUN: 17 mg/dL (ref 8–23)
CO2: 25 mmol/L (ref 22–32)
Calcium: 10.3 mg/dL (ref 8.9–10.3)
Chloride: 107 mmol/L (ref 98–111)
Creatinine: 1.01 mg/dL — ABNORMAL HIGH (ref 0.44–1.00)
GFR, Estimated: 56 mL/min — ABNORMAL LOW (ref >60)
Glucose, Bld: 119 mg/dL — ABNORMAL HIGH (ref 70–99)
Potassium: 4.5 mmol/L (ref 3.5–5.1)
Sodium: 142 mmol/L (ref 135–145)
Total Bilirubin: 0.5 mg/dL (ref 0.3–1.2)
Total Protein: 7.6 g/dL (ref 6.5–8.1)
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
## 2022-03-31 ENCOUNTER — Ambulatory Visit
Admission: RE | Admit: 2022-03-31 | Discharge: 2022-03-31 | Disposition: A | Discharge status: Home / Self Care | Payer: Medicare Other | Source: Ambulatory Visit | Attending: Hematology and Oncology | Admitting: Hematology and Oncology

## 2022-03-31 DIAGNOSIS — R928 Other abnormal and inconclusive findings on diagnostic imaging of breast: Secondary | ICD-10-CM

## 2022-04-05 HISTORY — PX: OTHER SURGICAL HISTORY: SHX169

## 2022-04-06 ENCOUNTER — Ambulatory Visit (INDEPENDENT_AMBULATORY_CARE_PROVIDER_SITE_OTHER): Payer: Medicare Other | Admitting: Neurology

## 2022-04-06 ENCOUNTER — Encounter: Payer: Self-pay | Admitting: Neurology

## 2022-04-06 VITALS — BP 134/76 | HR 99 | Ht 63.0 in | Wt 186.0 lb

## 2022-04-06 DIAGNOSIS — R4701 Aphasia: Secondary | ICD-10-CM

## 2022-04-06 DIAGNOSIS — H93A9 Pulsatile tinnitus, unspecified ear: Secondary | ICD-10-CM | POA: Diagnosis not present

## 2022-04-06 DIAGNOSIS — R55 Syncope and collapse: Secondary | ICD-10-CM

## 2022-04-06 NOTE — Patient Instructions (Addendum)
MRI of the brain w/wo contrast and look at the blood vessels as well (she has diazepam she says she can take, do not drive)   Recommendations to prevent or slow progression of cognitive decline:   Exercise You should increase exercise 30 to 45 minutes per day at least 3 days a week although 5 to 7 would be preferred. Any type of exercise (including walking) is acceptable although a recumbent bicycle may be best if you are unsteady. Disease related apathy can be a significant roadblock to exercise and the only way to overcome this is to make it a daily routine and perhaps have a reward at the end (something your loved one loves to eat or drink perhaps) or a personal trainer coming to the home can also be very useful. In general a structured, repetitive schedule is best.   Cardiovascular Health: You should optimize all cardiovascular risk factors (blood pressure, sugar, cholesterol) as vascular disease such as strokes and heart attacks can make memory problems much worse.   Diet: Eating a heart healthy (Mediterranean) diet is also a good idea; fish and poultry instead of red meat, nuts (mostly non-peanuts), vegetables, fruits, olive oil or canola oil (instead of butter), minimal salt (use other spices to flavor foods), whole grain rice, bread, cereal and pasta and wine in moderation.  General Health: Any diseases which effect your body will effect your brain such as a pneumonia, urinary infection, blood clot, heart attack or stroke. Keep contact with your primary care doctor for regular follow ups.  Sleep. A good nights sleep is healthy for the brain. Seven hours is recommended. If you have insomnia or poor sleep habits see the recommendations below  Tips: Structured and consistent daytime and nighttime routine, including regular wake times, bedtimes, and mealtimes, will be important for the patient to avoid confusion. Keeping frequently used items in designated places will help reduce stress from  searching. If there are worries about getting lost do not let the patient leave home unaccompanied. They might benefit from wearing an identification bracelet that will help others assist in finding home if they become lost. Information about nationwide safe return services and other helpful resources may be obtained through the Alzheimer's Association helpline at [phone].  Finances, Power of Producer, television/film/video Directives: You should consider putting legal safeguards in place with regard to financial and medical decision making. While the spouse always has power of attorney for medical and financial issues in the absence of any form, you should consider what you want in case the spouse / caregiver is no longer around or capable of making decisions.   [place] : http://www.welch.com/.pdf  Or Google "[place]" AND "An Forensic scientist for [employer]  Other States: ApartmentMom.com.ee  The signature on these forms should be notarized.   DRIVING:   Driving only during the day Drive only to familiar Locations Avoid driving during bad weather  If you would like to be tested to see if you are driving safely, [facility] has a Clinical Driving Evaluation. To schedule an appointment call [phone].                RESOURCES:  Memory Loss: Improve your short term memory By Silvio Pate  The Alzheimer's Reading Room http://www.alzheimersreadingroom.com/   The Alzheimer's Compendium http://www.alzcompend.info/  [employer] www.dukefamilysupport.JAS ([phone]  Recommended resources for caregivers (All can be purchased on [employer]):  1) A Caregiver's Guide to Dementia: Using Activities and Other Strategies to Prevent, Reduce and Manage Behavioral  Symptoms by Osie Bond. Tyler Aas and [employer]   2) A Caregiver's Guide  to [employer] Dementia by Caleen Essex MS BSN and Gaston Islam   3) What If It's Not Alzheimer's?: A Caregiver's Guide to Dementia by Koren Shiver (Author), Octaviano Batty (Editor)  3) The 36 hour day by Rabins and Mace  4) Understanding Difficult Behaviors by Merita Norton and White  Online course for helping caregivers reduce stress, guilt and frustration called the Caregivers Helpbook. The website is www.powerfultoolsforcaregivers.org  As a caregiver you are a Art gallery manager. Problems you face as a caregiver are usually unique to your situation and the way your loved-one's disease manifests itself. The best way to use these books is to look at the Table of Contents and read any chapters of interest or that apply to challenges you are having as a caregiver.  NATIONAL RESOURCES: For more information on neurological disorders or research programs funded by the [employer] of Neurological Disorders and Stroke, contact the Institute's Agricultural consultant (BRAIN) at: [place] [address] [phone] (toll-free) MasterBoxes.it  Information on dementia is also available from the following organizations: Alzheimer's Disease Education and Referral ([place]) [facility] on Aging [address] [place] [address] [phone] (toll-free) DVDEnthusiasts.nl  Alzheimer's Association [address], [place] [address] [phone] (toll-free, 24-hour helpline) [phone] (TDD) CapitalMile.co.nz  Alzheimer's Foundation of America [address], [place] [address] [phone] (toll-free) www.alzfdn.org  [facility] [address], [place] [address] [phone] www.alzdiscovery.org  Association for [address] [place]  [address] [phone] (toll-free) [place] [address] [phone] (toll-free) www.brightfocus.org/alzheimers  Dor[facility] Stabler [facility] Alzheimer's [facility] [address], [place] [address] [phone] www.https://lambert-jackson.net/  Lewy Body Dementia Association [address] [place] [address] [phone] (toll-free LBD Caregiver Link) www.lbda.[place], [place], [place] [phone] (toll-free) [phone] [facility]) https://carter.com/  National Organization for Rare Disorders [address] [place] [address] [phone]-KGSU [phone]) (toll-free) www.rarediseases.org  The Dementias: Hope Through Research was jointly produced by the [employer] of Neurological Disorders and Stroke (NINDS) and the [employer] on Aging (NIA), both part of the [employer], the [employer] research agency--supporting scientific studies that turn discovery into health. NINDS is the nation's leading funder of research on the brain and nervous system. The NINDS mission is to reduce the burden of neurological disease. For more information and resources, visit MasterBoxes.it [1] or call [phone]. NIA leads the federal government effort conducting and supporting research on aging and the health and well-being of older people. NIA's Alzheimer's Disease Education and Referral (ADEAR) Center offers information and publications on dementia and caregiving for families, caregivers, and professionals. For more information, visit DVDEnthusiasts.nl [2] or call [phone]. Also available from NIA are publications and information about Alzheimer's disease as well as the booklets Frontotemporal Disorders: Information for Patients, Families, and Caregivers and Lewy Body  Dementia: Information for Patients, Families, and Professionals. Source URL: SocialSpecialists.co.nz
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
## 2022-04-07 ENCOUNTER — Other Ambulatory Visit: Payer: Self-pay | Admitting: *Deleted

## 2022-04-10 ENCOUNTER — Encounter: Payer: Self-pay | Admitting: Neurology

## 2022-04-11 ENCOUNTER — Other Ambulatory Visit: Payer: Medicare Other

## 2022-04-11 ENCOUNTER — Telehealth: Payer: Self-pay | Admitting: Neurology

## 2022-04-11 NOTE — Telephone Encounter (Signed)
medicare/BCBS sup NPR sent to GI  

## 2022-04-22 ENCOUNTER — Encounter: Payer: Self-pay | Admitting: Hematology and Oncology

## 2022-04-25 ENCOUNTER — Encounter: Payer: Self-pay | Admitting: *Deleted

## 2022-04-26 ENCOUNTER — Ambulatory Visit
Admission: RE | Admit: 2022-04-26 | Discharge: 2022-04-26 | Disposition: A | Discharge status: Home / Self Care | Payer: Medicare Other | Source: Ambulatory Visit | Attending: Neurology | Admitting: Neurology

## 2022-04-26 DIAGNOSIS — R55 Syncope and collapse: Secondary | ICD-10-CM

## 2022-04-26 DIAGNOSIS — R4701 Aphasia: Secondary | ICD-10-CM

## 2022-04-26 DIAGNOSIS — H93A9 Pulsatile tinnitus, unspecified ear: Secondary | ICD-10-CM

## 2022-04-26 MED ORDER — GADOBENATE DIMEGLUMINE 529 MG/ML IV SOLN
17.0000 mL | Freq: Once | INTRAVENOUS | Status: AC | PRN
  Administered 2022-04-26: 17 mL via INTRAVENOUS

## 2022-05-03 ENCOUNTER — Encounter: Payer: Self-pay | Admitting: *Deleted

## 2022-05-03 ENCOUNTER — Telehealth: Payer: Self-pay | Admitting: *Deleted

## 2022-05-03 NOTE — Patient Outreach (Signed)
  Care Coordination   Initial Visit Note   05/03/2022 Name: [name] MRN: [id number] DOB: 15-Sep-1940  [name] is a 81 y.o. year old female who sees Plotnikov, Evie Lacks, MD for primary care. I spoke with  Hedy Jacob by phone today.  What matters to the patients health and wellness today?  "I am doing really well; I want to ask Dr. Alain Marion about whether or not I should get the RSV and COVID vaccines-- I will be seeing him next week on Monday; everything is else is going well and I am hoping he has a flu vaccine there at the office for me.  I am doing well on my medicines and taking everything like I am supposed to"  No further or ongoing care coordination needs identified   Goals Addressed             This Visit's Progress    COMPLETED: Care Coordination Activities: No follow up required   On track    Care Coordination Interventions: Evaluation of current treatment plan related to HTN, general health maintenance and patient's adherence to plan as established by provider Advised patient to provide appropriate vaccination information to provider or CM team member at next visit Advised patient to inquire about availability of flu vaccine at time of upcoming PCP office visit on 05/09/22; coached patient on talking points with PCP to address her questions about needed lab work, prescription renewals  Reviewed medications with patient and discussed adherence: she reports good adherence to medications and denies questions/ concerns today around medications Reviewed scheduled/upcoming provider appointments including Monday 05/09/22- PCP Advised patient to discuss recommendations for RSV and new COVID booster vaccines with provider Assessed social determinant of health barriers Confirmed patient attended Medicare Annual Wellness Visit 12/20/21; reviewed recent neurology and oncology provider office visits with patient- she verbalizes a very good  understanding of same; confirmed no concerns around blood pressures at home: she reports occasionally checking blood pressures at home and states "they are always very good"          SDOH assessments and interventions completed:  Yes  SDOH Interventions Today    Flowsheet Row Most Recent Value  SDOH Interventions   Food Insecurity Interventions Intervention Not Indicated  Transportation Interventions Intervention Not Indicated  [drives self]       Care Coordination Interventions Activated:  Yes  Care Coordination Interventions:  Yes, provided   Follow up plan: No further intervention required.   Encounter Outcome:  Pt. Visit Completed   Oneta Rack, RN, BSN, CCRN Alumnus RN CM Care Coordination/ Transition of [place] Management [phone]: direct office

## 2022-05-03 NOTE — Patient Instructions (Signed)
Visit Information  Thank you for taking time to visit with me today. Please don't hesitate to contact me if I can be of assistance to you.   Following are the goals we discussed today:   Goals Addressed             This Visit's Progress    COMPLETED: Care Coordination Activities: No follow up required   On track    Care Coordination Interventions: Evaluation of current treatment plan related to HTN, general health maintenance and patient's adherence to plan as established by provider Advised patient to provide appropriate vaccination information to provider or CM team member at next visit Advised patient to inquire about availability of flu vaccine at time of upcoming PCP office visit on 05/09/22; coached patient on talking points with PCP to address her questions about needed lab work, prescription renewals  Reviewed medications with patient and discussed adherence: she reports good adherence to medications and denies questions/ concerns today around medications Reviewed scheduled/upcoming provider appointments including Monday 05/09/22- PCP Advised patient to discuss recommendations for RSV and new COVID booster vaccines with provider Assessed social determinant of health barriers Confirmed patient attended Medicare Annual Wellness Visit 12/20/21; reviewed recent neurology and oncology provider office visits with patient- she verbalizes a very good understanding of same; confirmed no concerns around blood pressures at home: she reports occasionally checking blood pressures at home and states "they are always very good"          If you are experiencing a Mental Health or [place] or need someone to talk to, please  call the Suicide and Crisis Lifeline: 988 call the [place] National Suicide Prevention Lifeline: 830 182 1954 or TTY: (754)805-7454 TTY 854-658-9498) to talk to a trained counselor call 1-800-273-TALK (toll free, 24 hour hotline) go to [facility] [address] [place] [phone]) call the [place]: [phone] call 911   Patient verbalizes understanding of instructions and care plan provided today and agrees to view in [place]. Active MyChart status and patient understanding of how to access instructions and care plan via MyChart confirmed with patient.     No further follow up required: no further or ongoing care coordination needs identified  Oneta Rack, RN, BSN, CCRN Alumnus RN CM Care Coordination/ Transition of [place] Management [phone]: direct office

## 2022-05-05 ENCOUNTER — Telehealth: Payer: Self-pay | Admitting: Internal Medicine

## 2022-05-05 NOTE — Telephone Encounter (Signed)
Patient needs refills on her amlodipine '5mg'$  and her losartan 100 mg - Please send to [facility] on [address].

## 2022-05-06 MED ORDER — LOSARTAN POTASSIUM 100 MG PO TABS: 100.0000 mg | ORAL_TABLET | Freq: Every day | ORAL | 1 refills | Status: DC

## 2022-05-06 MED ORDER — AMLODIPINE BESYLATE 5 MG PO TABS: 5.0000 mg | ORAL_TABLET | Freq: Every day | ORAL | 1 refills | Status: DC

## 2022-05-06 NOTE — Telephone Encounter (Signed)
Sent refill to [facility].Marland KitchenVerl Dicker b

## 2022-05-09 ENCOUNTER — Ambulatory Visit (INDEPENDENT_AMBULATORY_CARE_PROVIDER_SITE_OTHER): Payer: Medicare Other

## 2022-05-09 ENCOUNTER — Ambulatory Visit (INDEPENDENT_AMBULATORY_CARE_PROVIDER_SITE_OTHER): Payer: Medicare Other | Admitting: Internal Medicine

## 2022-05-09 ENCOUNTER — Encounter: Payer: Self-pay | Admitting: Internal Medicine

## 2022-05-09 VITALS — BP 128/80 | HR 87 | Temp 98.2°F | Ht 63.0 in | Wt 187.0 lb

## 2022-05-09 DIAGNOSIS — R194 Change in bowel habit: Secondary | ICD-10-CM

## 2022-05-09 DIAGNOSIS — Z23 Encounter for immunization: Secondary | ICD-10-CM | POA: Diagnosis not present

## 2022-05-09 DIAGNOSIS — Z170 Estrogen receptor positive status [ER+]: Secondary | ICD-10-CM

## 2022-05-09 DIAGNOSIS — C50412 Malignant neoplasm of upper-outer quadrant of left female breast: Secondary | ICD-10-CM

## 2022-05-09 DIAGNOSIS — E785 Hyperlipidemia, unspecified: Secondary | ICD-10-CM

## 2022-05-09 DIAGNOSIS — R202 Paresthesia of skin: Secondary | ICD-10-CM

## 2022-05-09 DIAGNOSIS — R413 Other amnesia: Secondary | ICD-10-CM

## 2022-05-09 DIAGNOSIS — F413 Other mixed anxiety disorders: Secondary | ICD-10-CM | POA: Diagnosis not present

## 2022-05-09 DIAGNOSIS — Z0000 Encounter for general adult medical examination without abnormal findings: Secondary | ICD-10-CM

## 2022-05-09 DIAGNOSIS — I10 Essential (primary) hypertension: Secondary | ICD-10-CM

## 2022-05-09 DIAGNOSIS — E538 Deficiency of other specified B group vitamins: Secondary | ICD-10-CM

## 2022-05-09 LAB — LIPID PANEL
Cholesterol: 240 mg/dL — ABNORMAL HIGH (ref 0–200)
HDL: 57.1 mg/dL (ref >39.00)
LDL Cholesterol: 150 mg/dL — ABNORMAL HIGH (ref 0–99)
NonHDL: 183.31
Total CHOL/HDL Ratio: 4
Triglycerides: 168 mg/dL — ABNORMAL HIGH (ref 0.0–149.0)
VLDL: 33.6 mg/dL (ref 0.0–40.0)

## 2022-05-09 LAB — URINALYSIS, ROUTINE W REFLEX MICROSCOPIC
Bilirubin Urine: NEGATIVE
Hgb urine dipstick: NEGATIVE
Nitrite: NEGATIVE
Specific Gravity, Urine: 1.025 (ref 1.000–1.030)
Total Protein, Urine: NEGATIVE
Urine Glucose: NEGATIVE
Urobilinogen, UA: 1 (ref 0.0–1.0)
pH: 5.5 (ref 5.0–8.0)

## 2022-05-09 LAB — CBC WITH DIFFERENTIAL/PLATELET
Basophils Absolute: 0 10*3/uL (ref 0.0–0.1)
Basophils Relative: 0.4 % (ref 0.0–3.0)
Eosinophils Absolute: 0.1 10*3/uL (ref 0.0–0.7)
Eosinophils Relative: 1.1 % (ref 0.0–5.0)
HCT: 40.5 % (ref 36.0–46.0)
Hemoglobin: 13.9 g/dL (ref 12.0–15.0)
Lymphocytes Relative: 24.3 % (ref 12.0–46.0)
Lymphs Abs: 2.2 10*3/uL (ref 0.7–4.0)
MCHC: 34.4 g/dL (ref 30.0–36.0)
MCV: 94.8 fl (ref 78.0–100.0)
Monocytes Absolute: 1 10*3/uL (ref 0.1–1.0)
Monocytes Relative: 10.8 % (ref 3.0–12.0)
Neutro Abs: 5.7 10*3/uL (ref 1.4–7.7)
Neutrophils Relative %: 63.4 % (ref 43.0–77.0)
Platelets: 318 10*3/uL (ref 150.0–400.0)
RBC: 4.27 Mil/uL (ref 3.87–5.11)
RDW: 13.4 % (ref 11.5–15.5)
WBC: 8.9 10*3/uL (ref 4.0–10.5)

## 2022-05-09 LAB — TSH: TSH: 2.34 u[IU]/mL (ref 0.35–5.50)

## 2022-05-09 LAB — COMPREHENSIVE METABOLIC PANEL
ALT: 19 U/L (ref 0–35)
AST: 17 U/L (ref 0–37)
Albumin: 4.2 g/dL (ref 3.5–5.2)
Alkaline Phosphatase: 80 U/L (ref 39–117)
BUN: 14 mg/dL (ref 6–23)
CO2: 25 mEq/L (ref 19–32)
Calcium: 9.6 mg/dL (ref 8.4–10.5)
Chloride: 103 mEq/L (ref 96–112)
Creatinine, Ser: 0.8 mg/dL (ref 0.40–1.20)
GFR: 68.56 mL/min (ref >60.00)
Glucose, Bld: 93 mg/dL (ref 70–99)
Potassium: 4 mEq/L (ref 3.5–5.1)
Sodium: 138 mEq/L (ref 135–145)
Total Bilirubin: 0.5 mg/dL (ref 0.2–1.2)
Total Protein: 7.3 g/dL (ref 6.0–8.3)

## 2022-05-09 LAB — VITAMIN B12: Vitamin B-12: 211 pg/mL (ref 211–911)

## 2022-05-09 MED ORDER — LOSARTAN POTASSIUM 100 MG PO TABS: 100.0000 mg | ORAL_TABLET | Freq: Every day | ORAL | 3 refills | Status: DC

## 2022-05-09 MED ORDER — AMLODIPINE BESYLATE 5 MG PO TABS: 5.0000 mg | ORAL_TABLET | Freq: Every day | ORAL | 3 refills | Status: DC

## 2022-05-09 NOTE — Addendum Note (Signed)
Addended by: Marcina Millard on: 05/09/2022 03:19 PM   Modules accepted: Orders

## 2022-05-09 NOTE — Assessment & Plan Note (Addendum)
Cont on losartan and amlodipine

## 2022-05-09 NOTE — Progress Notes (Signed)
Subjective:  Patient ID: [name], female    DOB: [birth year]  Age: 81 y.o. MRN: [id number]  CC: Follow-up (3 month follow up)   HPI [name] presents for breast cancer, HTN, depression. Pt stopped Wellbutrin and Arimidex. C/o insomnia C/o BMs w/urination x 2 months several times a day. Last colon in 2019  Outpatient Medications Prior to Visit  Medication Sig Dispense Refill   acetaminophen (TYLENOL) 500 MG tablet Take 1,000 mg by mouth every 6 (six) hours as needed.     amoxicillin (AMOXIL) 500 MG capsule Take 4 capsules 1 hour prior to a dental cleaning, dental work 20 capsule 1   Ascorbic Acid (VITAMIN C PO) Take by mouth daily.     b complex vitamins tablet Take 1 tablet by mouth daily. 100 tablet 3   Cholecalciferol (VITAMIN D3) 50 MCG (2000 UT) capsule Take 2 capsules (4,000 Units total) by mouth daily. 100 capsule 3   diazepam (VALIUM) 5 MG tablet TAKE 1 TABLET BY MOUTH EVERY 12 HOURS AS NEEDED FOR ANXIETY OR MUSCLE SPASMS. 30 tablet 3   loratadine (CLARITIN) 10 MG tablet Take 1 tablet (10 mg total) by mouth daily. 30 tablet 11   zolpidem (AMBIEN CR) 6.25 MG CR tablet Take 1 tablet (6.25 mg total) by mouth at bedtime as needed. for sleep 90 tablet 1   amLODipine (NORVASC) 5 MG tablet Take 1 tablet (5 mg total) by mouth daily. 90 tablet 1   losartan (COZAAR) 100 MG tablet Take 1 tablet (100 mg total) by mouth daily. 90 tablet 1   ibuprofen (ADVIL,MOTRIN) 400 MG tablet Take 400 mg by mouth every 6 (six) hours as needed. (Patient not taking: Reported on 05/09/2022)     anastrozole (ARIMIDEX) 1 MG tablet Take 1 tablet (1 mg total) by mouth daily. (Patient not taking: Reported on 05/09/2022) 90 tablet 3   buPROPion (WELLBUTRIN XL) 150 MG 24 hr tablet Take 1 tablet (150 mg total) by mouth daily. (Patient not taking: Reported on 05/09/2022) 90 tablet 4   No facility-administered medications prior to visit.    ROS: Review of Systems  Constitutional:   Negative for activity change, appetite change, chills, fatigue and unexpected weight change.  HENT:  Negative for congestion, mouth sores and sinus pressure.   Eyes:  Negative for visual disturbance.  Respiratory:  Negative for cough and chest tightness.   Gastrointestinal:  Negative for abdominal pain and nausea.  Genitourinary:  Negative for difficulty urinating, frequency and vaginal pain.  Musculoskeletal:  Negative for back pain and gait problem.  Skin:  Negative for pallor and rash.  Neurological:  Negative for dizziness, tremors, weakness, numbness and headaches.  Psychiatric/Behavioral:  Positive for decreased concentration and sleep disturbance. Negative for confusion, dysphoric mood and suicidal ideas. The patient is nervous/anxious.     Objective:  BP 128/80 (BP Location: Left Arm, Patient Position: Sitting, Cuff Size: Large)   Pulse 87   Temp 98.2 F (36.8 C) (Oral)   Ht '5\' 3"'$  (1.6 m)   Wt 187 lb (84.8 kg)   SpO2 97%   BMI 33.13 kg/m   BP Readings from Last 3 Encounters:  05/09/22 128/80  04/06/22 134/76  03/30/22 135/74    Wt Readings from Last 3 Encounters:  05/09/22 187 lb (84.8 kg)  04/06/22 186 lb (84.4 kg)  03/30/22 185 lb 4.8 oz (84.1 kg)    Physical Exam Constitutional:      General: She is not in acute distress.  Appearance: She is well-developed. She is obese.  HENT:     Head: Normocephalic.     Right Ear: External ear normal.     Left Ear: External ear normal.     Nose: Nose normal.  Eyes:     General:        Right eye: No discharge.        Left eye: No discharge.     Conjunctiva/sclera: Conjunctivae normal.     Pupils: Pupils are equal, round, and reactive to light.  Neck:     Thyroid: No thyromegaly.     Vascular: No JVD.     Trachea: No tracheal deviation.  Cardiovascular:     Rate and Rhythm: Normal rate and regular rhythm.     Heart sounds: Normal heart sounds.  Pulmonary:     Effort: No respiratory distress.     Breath sounds:  No stridor. No wheezing.  Abdominal:     General: Bowel sounds are normal. There is no distension.     Palpations: Abdomen is soft. There is no mass.     Tenderness: There is no abdominal tenderness. There is no guarding or rebound.  Musculoskeletal:        General: No tenderness.     Cervical back: Normal range of motion and neck supple. No rigidity.  Lymphadenopathy:     Cervical: No cervical adenopathy.  Skin:    Findings: No erythema or rash.  Neurological:     Mental Status: She is oriented to person, place, and time.     Cranial Nerves: No cranial nerve deficit.     Motor: No abnormal muscle tone.     Coordination: Coordination normal.     Deep Tendon Reflexes: Reflexes normal.  Psychiatric:        Behavior: Behavior normal.        Thought Content: Thought content normal.        Judgment: Judgment normal.     Lab Results  Component Value Date   WBC 8.1 03/30/2022   HGB 13.8 03/30/2022   HCT 40.2 03/30/2022   PLT 326 03/30/2022   GLUCOSE 119 (H) 03/30/2022   CHOL 218 (H) 02/24/2021   TRIG 199.0 (H) 02/24/2021   HDL 52.00 02/24/2021   LDLDIRECT 161.3 06/17/2011   LDLCALC 126 (H) 02/24/2021   ALT 20 03/30/2022   AST 21 03/30/2022   NA 142 03/30/2022   K 4.5 03/30/2022   CL 107 03/30/2022   CREATININE 1.01 (H) 03/30/2022   BUN 17 03/30/2022   CO2 25 03/30/2022   TSH 2.20 02/24/2021   INR 1.06 06/12/2018    MR ANGIO HEAD WO CONTRAST  Result Date: 04/28/2022 Table formatting from the original result was not included. [facility] NEURO[facility] REPORT STUDY DATE: 04/26/22 PATIENT NAME: [name] DOB: [birth year] MRN: [id number] ORDERING CLINICIAN: Melvenia Beam, MD CLINICAL HISTORY: 81 y.o. year old female with: 1. Near syncope  2. Aphasia  3. Pulsatile tinnitus   EXAM: MR ANGIO HEAD WO CONTRAST TECHNIQUE: MR angiogram of the head was obtained utilizing 3D time of flight sequences from below the vertebrobasilar junction up to the  intracranial vasculature without contrast.  Computerized reconstructions were obtained. CONTRAST: Diagnostic Product Medications (last 720 hours) Showing orders from other encounters  Date/Time Action Medication Dose  04/26/22 1417 Contrast Given  gadobenate dimeglumine (MULTIHANCE) injection 17 mL 17 mL   COMPARISON: none IMAGING SITE: [facility] AT [place]  [place] [address] FINDINGS: This study is of adequate technical quality. Flow signal of the bilateral internal carotid arteries have no stenosis. The bilateral middle and anterior cerebral arteries have no stenosis. The bilateral vertebral, basilar, bilateral posterior cerebral arteries have no stenosis. No aneurysmal dilatations are seen.   Normal MRA head without contrast. INTERPRETING PHYSICIAN: Penni Bombard, MD Certified in Neurology, Neurophysiology and Neuroimaging [facility] [address] [place], [place] [address] [phone]  MR BRAIN W WO CONTRAST  Result Date: 04/28/2022 Table formatting from the original result was not included. [facility] NEURO[facility] REPORT STUDY DATE: 04/26/22 PATIENT NAME: [name] DOB: 05-04-1941 MRN: [id number] ORDERING CLINICIAN: Melvenia Beam, MD CLINICAL HISTORY: 81 y.o. year old female with: 1. Near syncope  2. Aphasia  3. Pulsatile tinnitus   EXAM: MR BRAIN W WO CONTRAST TECHNIQUE: MRI of the brain with and without contrast was obtained utilizing multiplanar, multiecho pulse sequences. CONTRAST: Diagnostic Product Medications (last 720 hours)   Date/Time Action Medication Dose  04/26/22 1417 Contrast Given  gadobenate dimeglumine (MULTIHANCE) injection 17 mL 17 mL   COMPARISON: 02/24/20 CT IMAGING SITE: [facility] [place] [address] FINDINGS: No abnormal lesions are seen on diffusion-weighted views  to suggest acute ischemia. The cortical sulci, fissures and cisterns are perisylvian and moderate mesial temporal atrophy. Lateral, third and fourth ventricle are mildly enlarged on ex vacuo basis.  No extra-axial fluid collections are seen. No evidence of mass effect or midline shift.  Mild periventricular and subcortical foci of nonspecific gliosis.  No abnormal lesions are seen on post contrast views.  On sagittal views the posterior fossa, pituitary gland and corpus callosum are unremarkable. No evidence of intracranial hemorrhage on SWI views. The orbits and their contents, paranasal sinuses and calvarium are notable for bilateral lens extractions.  Intracranial flow voids are present.   MRI brain (with and without) demonstrating: -Mild to moderate atrophy and mild nonspecific gliosis. -No acute findings. INTERPRETING PHYSICIAN: Penni Bombard, MD Certified in Neurology, Neurophysiology and Neuroimaging [facility] [address], [place], [place] [address] [phone]   Assessment & Plan:   Problem List Items Addressed This Visit     Anxiety disorder     Pt stopped Wellbutrin.  Diazepam as needed-rare use.  Potential benefits of a long term benzodiazepines  use as well as potential risks  and complications were explained to the patient and were aknowledged.      B12 deficiency    Chronic - on Vit B12      Bowel habit changes - Primary    C/o BMs w/urination x 2 months several times a day. Last colon in 2019 Abd X ray Labs GI ref if not better      Relevant Orders   DG Abd 2 Views   HTN (hypertension)    Cont on losartan and amlodipine      Relevant Medications   amLODipine (NORVASC) 5 MG tablet   losartan (COZAAR) 100 MG tablet   Malignant neoplasm of upper-outer quadrant of left breast in female, estrogen receptor positive ([place])    Pt stopped  Arimidex after 5 years. F/u w/Dr Iruku      Memory impairment    Pt declined Aricept           Meds ordered this encounter  Medications   amLODipine (NORVASC) 5 MG tablet    Sig: Take 1 tablet (5  mg total) by mouth daily.    Dispense:  90 tablet    Refill:  3   losartan (COZAAR) 100 MG tablet    Sig: Take 1 tablet (100 mg total) by mouth daily.    Dispense:  90 tablet    Refill:  3      Follow-up: No follow-ups on file.  [name], MD

## 2022-05-09 NOTE — Assessment & Plan Note (Signed)
C/o BMs w/urination x 2 months several times a day. Last colon in 2019 Abd X ray Labs GI ref if not better

## 2022-05-09 NOTE — Assessment & Plan Note (Signed)
Pt stopped Wellbutrin.  Diazepam as needed-rare use.  Potential benefits of a long term benzodiazepines  use as well as potential risks  and complications were explained to the patient and were aknowledged.

## 2022-05-09 NOTE — Assessment & Plan Note (Signed)
Pt declined Aricept

## 2022-05-09 NOTE — Assessment & Plan Note (Signed)
Pt stopped  Arimidex after 5 years. F/u w/Dr Chryl Heck

## 2022-05-09 NOTE — Assessment & Plan Note (Signed)
Chronic - on Vit B12

## 2022-05-30 ENCOUNTER — Other Ambulatory Visit: Payer: Self-pay | Admitting: Internal Medicine

## 2022-06-01 NOTE — Telephone Encounter (Signed)
Patient called to follow up, She also said that she needs a refill on diazepam (VALIUM) 5 MG tablet

## 2022-06-03 NOTE — Telephone Encounter (Signed)
Patient called and said she has been out and asked if someone can please fill it before the weekend

## 2022-06-04 ENCOUNTER — Encounter: Payer: Self-pay | Admitting: Internal Medicine

## 2022-06-30 ENCOUNTER — Ambulatory Visit (INDEPENDENT_AMBULATORY_CARE_PROVIDER_SITE_OTHER): Payer: Medicare Other | Admitting: Obstetrics & Gynecology

## 2022-06-30 ENCOUNTER — Encounter: Payer: Self-pay | Admitting: Obstetrics & Gynecology

## 2022-06-30 VITALS — BP 114/70 | HR 98 | Ht 62.75 in | Wt 190.0 lb

## 2022-06-30 DIAGNOSIS — C50419 Malignant neoplasm of upper-outer quadrant of unspecified female breast: Secondary | ICD-10-CM

## 2022-06-30 DIAGNOSIS — Z01419 Encounter for gynecological examination (general) (routine) without abnormal findings: Secondary | ICD-10-CM

## 2022-06-30 DIAGNOSIS — Z9289 Personal history of other medical treatment: Secondary | ICD-10-CM

## 2022-06-30 DIAGNOSIS — Z780 Asymptomatic menopausal state: Secondary | ICD-10-CM

## 2022-06-30 DIAGNOSIS — Z9189 Other specified personal risk factors, not elsewhere classified: Secondary | ICD-10-CM | POA: Diagnosis not present

## 2022-06-30 DIAGNOSIS — Z853 Personal history of malignant neoplasm of breast: Secondary | ICD-10-CM

## 2022-06-30 NOTE — Progress Notes (Signed)
[name] [birth year] [id number]   History:    81 y.o.  [id number]   RP:  Established patient presenting for annual gyn exam    HPI: Postmenopausal. Prior supracervical hysterectomy.  No significant menopausal symptoms.  No vaginal bleeding. Pap smear Neg 06/2016.  No significant history of abnormal Pap smears.  No indication to repeat a Pap at this time.  Breasts normal.  History of left breast cancer.  Mammogram 03/18/2022, Rt Dx Mammo/breast US Neg 03/31/22. Colonoscopy 2019.  DEXA 05/2019 obtained through Dr. Virgie Dad office showed BMD was normal. BMI 33.93.  Health labs with Fam MD. Flu vaccine with pcp.   Past medical history,surgical history, family history and social history were all reviewed and documented in the EPIC chart.  Gynecologic History No LMP recorded. Patient has had a hysterectomy.  Obstetric History OB History  Gravida Para Term Preterm AB Living  2 1 0   1 1  SAB IAB Ectopic Multiple Live Births    1          # Outcome Date GA Lbr Len/2nd Weight Sex Delivery Anes PTL Lv  2 IAB           1 Para     M Vag-Spont        ROS: A ROS was performed and pertinent positives and negatives are included in the history. GENERAL: No fevers or chills. HEENT: No change in vision, no earache, sore throat or sinus congestion. NECK: No pain or stiffness. CARDIOVASCULAR: No chest pain or pressure. No palpitations. PULMONARY: No shortness of breath, cough or wheeze. GASTROINTESTINAL: No abdominal pain, nausea, vomiting or diarrhea, melena or bright red blood per rectum. GENITOURINARY: No urinary frequency, urgency, hesitancy or dysuria. MUSCULOSKELETAL: No joint or muscle pain, no back pain, no recent trauma. DERMATOLOGIC: No rash, no itching, no lesions. ENDOCRINE: No polyuria, polydipsia, no heat or cold intolerance. No recent change in weight. HEMATOLOGICAL: No anemia or easy bruising or bleeding. NEUROLOGIC: No headache, seizures, numbness, tingling or weakness.  PSYCHIATRIC: No depression, no loss of interest in normal activity or change in sleep pattern.     Exam:   BP 114/70   Pulse 98   Ht 5' 2.75" (1.594 m)   Wt 190 lb (86.2 kg)   SpO2 97%   BMI 33.93 kg/m   Body mass index is 33.93 kg/m.  General appearance : Well developed well nourished female. No acute distress HEENT: Eyes: no retinal hemorrhage or exudates,  Neck supple, trachea midline, no carotid bruits, no thyroidmegaly Lungs: Clear to auscultation, no rhonchi or wheezes, or rib retractions  Heart: Regular rate and rhythm, no murmurs or gallops Breast:Examined in sitting and supine position were symmetrical in appearance, no palpable masses or tenderness,  no skin retraction, no nipple inversion, no nipple discharge, no skin discoloration, no axillary or supraclavicular lymphadenopathy Abdomen: no palpable masses or tenderness, no rebound or guarding Extremities: no edema or skin discoloration or tenderness  Pelvic: Vulva: Normal             Vagina: No gross lesions or discharge  Cervix: No gross lesions             Uterus absent  Adnexa  Without masses or tenderness  Anus: Normal   Assessment/Plan:  81 y.o. female for annual exam   1. Well female exam with routine gynecological exam Postmenopausal. Prior supracervical hysterectomy.  No significant menopausal symptoms.  No vaginal bleeding. Pap smear Neg 06/2016.  No significant history of  abnormal Pap smears.  No indication to repeat a Pap at this time.  Breasts normal.  History of left breast cancer.  Mammogram 03/18/2022, Rt Dx Mammo/breast US Neg 03/31/22. Colonoscopy 2019.  DEXA 05/2019 was normal. BMI 33.93. Health labs with Fam MD. Flu vaccine with pcp.   2. Postmenopause Postmenopausal. Prior supracervical hysterectomy.  No significant menopausal symptoms.  No vaginal bleeding.  3. Personal history of breast cancer Mammogram 03/18/2022, Rt Dx Mammo/breast US Neg 03/31/22.  4. Other specified personal risk factors,  not elsewhere classified  5. Personal history of other medical treatment  Other orders - fexofenadine (ALLEGRA) 180 MG tablet; Take 180 mg by mouth daily.   Princess Bruins MD, 1:50 PM

## 2022-09-01 ENCOUNTER — Ambulatory Visit (INDEPENDENT_AMBULATORY_CARE_PROVIDER_SITE_OTHER): Payer: Medicare Other | Admitting: Internal Medicine

## 2022-09-01 ENCOUNTER — Ambulatory Visit (INDEPENDENT_AMBULATORY_CARE_PROVIDER_SITE_OTHER): Payer: Medicare Other

## 2022-09-01 ENCOUNTER — Encounter: Payer: Self-pay | Admitting: Internal Medicine

## 2022-09-01 VITALS — BP 122/78 | HR 103 | Temp 98.3°F | Ht 62.75 in | Wt 180.0 lb

## 2022-09-01 DIAGNOSIS — R634 Abnormal weight loss: Secondary | ICD-10-CM | POA: Diagnosis not present

## 2022-09-01 DIAGNOSIS — J069 Acute upper respiratory infection, unspecified: Secondary | ICD-10-CM | POA: Diagnosis not present

## 2022-09-01 DIAGNOSIS — F439 Reaction to severe stress, unspecified: Secondary | ICD-10-CM | POA: Diagnosis not present

## 2022-09-01 MED ORDER — PROMETHAZINE-DM 6.25-15 MG/5ML PO SYRP
5.0000 mL | ORAL_SOLUTION | Freq: Four times a day (QID) | ORAL | 0 refills | Status: DC | PRN
Start: 2022-09-01 — End: 2023-01-11

## 2022-09-01 MED ORDER — AZITHROMYCIN 250 MG PO TABS: ORAL_TABLET | ORAL | 0 refills | Status: DC

## 2022-09-01 MED ORDER — BENZONATATE 200 MG PO CAPS: 200.0000 mg | ORAL_CAPSULE | Freq: Three times a day (TID) | ORAL | 0 refills | Status: DC | PRN
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
## 2022-09-01 NOTE — Assessment & Plan Note (Signed)
New Bad dry cough since Jan 24 (URI)
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
## 2022-09-01 NOTE — Assessment & Plan Note (Signed)
New. No appetite, pt lost wt. Pt had a tooth extraction on Aug 12, 2022

## 2022-10-13 ENCOUNTER — Encounter: Payer: Self-pay | Admitting: Internal Medicine

## 2022-10-13 ENCOUNTER — Ambulatory Visit: Payer: Medicare Other | Admitting: Internal Medicine

## 2022-10-13 VITALS — BP 140/80 | HR 96 | Temp 98.1°F | Wt 181.5 lb

## 2022-10-13 DIAGNOSIS — R252 Cramp and spasm: Secondary | ICD-10-CM

## 2022-10-13 DIAGNOSIS — M5440 Lumbago with sciatica, unspecified side: Secondary | ICD-10-CM | POA: Diagnosis not present

## 2022-10-13 DIAGNOSIS — G4700 Insomnia, unspecified: Secondary | ICD-10-CM | POA: Diagnosis not present

## 2022-10-13 DIAGNOSIS — G8929 Other chronic pain: Secondary | ICD-10-CM | POA: Diagnosis not present

## 2022-10-13 LAB — COMPREHENSIVE METABOLIC PANEL
ALT: 19 U/L (ref 0–35)
AST: 21 U/L (ref 0–37)
Albumin: 4.2 g/dL (ref 3.5–5.2)
Alkaline Phosphatase: 91 U/L (ref 39–117)
BUN: 13 mg/dL (ref 6–23)
CO2: 27 mEq/L (ref 19–32)
Calcium: 9.9 mg/dL (ref 8.4–10.5)
Chloride: 102 mEq/L (ref 96–112)
Creatinine, Ser: 0.87 mg/dL (ref 0.40–1.20)
GFR: 61.81 mL/min (ref >60.00)
Glucose, Bld: 98 mg/dL (ref 70–99)
Potassium: 4.2 mEq/L (ref 3.5–5.1)
Sodium: 138 mEq/L (ref 135–145)
Total Bilirubin: 0.4 mg/dL (ref 0.2–1.2)
Total Protein: 7.7 g/dL (ref 6.0–8.3)

## 2022-10-13 LAB — TSH: TSH: 2.83 u[IU]/mL (ref 0.35–5.50)

## 2022-10-13 MED ORDER — ZOLPIDEM TARTRATE 10 MG PO TABS: 5.0000 mg | ORAL_TABLET | Freq: Every evening | ORAL | 3 refills | Status: DC | PRN

## 2022-10-13 NOTE — Assessment & Plan Note (Signed)
Diazepam prn  Potential benefits of a long term benzodiazepines  use as well as potential risks  and complications were explained to the patient and were aknowledged. 

## 2022-10-13 NOTE — Assessment & Plan Note (Addendum)
Change to Zolpidem 10 mg 5-10 mg at hs  Potential benefits of a long term benzodiazepines  use as well as potential risks  and complications were explained to the patient and were aknowledged.

## 2022-10-13 NOTE — Patient Instructions (Signed)
Use Magnesium oil spray for muscle cramps 
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
## 2022-10-13 NOTE — Assessment & Plan Note (Addendum)
She needs to have never been on the laptop computer/tablet set up Use Magnesium oil spray for muscle cramps Diazepam prn  Potential benefits of a long term benzodiazepines  use as well as potential risks  and complications were explained to the patient and were aknowledged.

## 2022-10-19 ENCOUNTER — Ambulatory Visit (INDEPENDENT_AMBULATORY_CARE_PROVIDER_SITE_OTHER): Payer: Medicare Other | Admitting: Adult Health

## 2022-10-19 ENCOUNTER — Encounter: Payer: Self-pay | Admitting: Adult Health

## 2022-10-19 VITALS — BP 130/71 | HR 86 | Ht 64.0 in | Wt 179.0 lb

## 2022-10-19 DIAGNOSIS — R4701 Aphasia: Secondary | ICD-10-CM
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
## 2022-10-19 NOTE — Patient Instructions (Signed)
Your Plan:  Continue to monitor symptoms Memory score is stable If your symptoms worsen or you develop new symptoms please let us know.    Thank you for coming to see us at [facility]. I hope we have been able to provide you high quality care today.  You may receive a patient satisfaction survey over the next few weeks. We would appreciate your feedback and comments so that we may continue to improve ourselves and the health of our patients.' 

## 2022-10-23 ENCOUNTER — Encounter: Payer: Self-pay | Admitting: Internal Medicine

## 2022-10-24 ENCOUNTER — Other Ambulatory Visit: Payer: Self-pay | Admitting: Internal Medicine

## 2022-10-24 DIAGNOSIS — F419 Anxiety disorder, unspecified: Secondary | ICD-10-CM

## 2022-11-12 ENCOUNTER — Other Ambulatory Visit: Payer: Self-pay | Admitting: Internal Medicine

## 2022-12-01 ENCOUNTER — Telehealth: Payer: Self-pay | Admitting: Radiology

## 2022-12-01 NOTE — Telephone Encounter (Signed)
Contacted [name] to schedule their annual wellness visit. LVM for pt to call back to schedule AWV. Please schedule if pt returns call.   Dalayza Zambrana K. CMA

## 2023-01-11 ENCOUNTER — Encounter: Payer: Self-pay | Admitting: Internal Medicine

## 2023-01-11 ENCOUNTER — Ambulatory Visit (INDEPENDENT_AMBULATORY_CARE_PROVIDER_SITE_OTHER): Payer: Medicare Other | Admitting: Internal Medicine

## 2023-01-11 VITALS — BP 120/78 | HR 85 | Temp 98.6°F | Ht 64.0 in | Wt 185.0 lb

## 2023-01-11 DIAGNOSIS — E6609 Other obesity due to excess calories: Secondary | ICD-10-CM | POA: Diagnosis not present

## 2023-01-11 DIAGNOSIS — Z6834 Body mass index (BMI) 34.0-34.9, adult: Secondary | ICD-10-CM | POA: Diagnosis not present

## 2023-01-11 DIAGNOSIS — F419 Anxiety disorder, unspecified: Secondary | ICD-10-CM

## 2023-01-11 DIAGNOSIS — E559 Vitamin D deficiency, unspecified: Secondary | ICD-10-CM | POA: Diagnosis not present

## 2023-01-11 DIAGNOSIS — E538 Deficiency of other specified B group vitamins: Secondary | ICD-10-CM

## 2023-01-11 DIAGNOSIS — F413 Other mixed anxiety disorders: Secondary | ICD-10-CM

## 2023-01-11 MED ORDER — DIAZEPAM 5 MG PO TABS: ORAL_TABLET | ORAL | 3 refills | Status: DC

## 2023-01-11 MED ORDER — ZOLPIDEM TARTRATE 10 MG PO TABS: 5.0000 mg | ORAL_TABLET | Freq: Every evening | ORAL | 3 refills | Status: DC | PRN

## 2023-01-11 NOTE — Assessment & Plan Note (Signed)
Risks associated with treatment noncompliance were discussed. Compliance was encouraged. Vit D 2000 iu/d 

## 2023-01-11 NOTE — Progress Notes (Signed)
Subjective:  Patient ID: [name], female    DOB: 1941/07/22  Age: 82 y.o. MRN: [id number]  CC: Follow-up (3 MNTH F/U)   HPI Allegra Warfel presents for Stress w/Eddie being in prison. F/u on anxiety, LBP  Outpatient Medications Prior to Visit  Medication Sig Dispense Refill   acetaminophen (TYLENOL) 500 MG tablet Take 1,000 mg by mouth every 6 (six) hours as needed.     amLODipine (NORVASC) 5 MG tablet Take 1 tablet (5 mg total) by mouth daily. 90 tablet 3   amoxicillin (AMOXIL) 500 MG capsule Take 4 capsules 1 hour prior to a dental cleaning, dental work 20 capsule 1   Ascorbic Acid (VITAMIN C PO) Take by mouth daily.     b complex vitamins tablet Take 1 tablet by mouth daily. 100 tablet 3   Cholecalciferol (VITAMIN D3) 50 MCG (2000 UT) capsule Take 2 capsules (4,000 Units total) by mouth daily. 100 capsule 3   fexofenadine (ALLEGRA) 180 MG tablet Take 180 mg by mouth daily.     ibuprofen (ADVIL,MOTRIN) 400 MG tablet Take 400 mg by mouth every 6 (six) hours as needed.     loratadine (CLARITIN) 10 MG tablet Take 1 tablet (10 mg total) by mouth daily. 30 tablet 11   losartan (COZAAR) 100 MG tablet Take 1 tablet (100 mg total) by mouth daily. 90 tablet 3   diazepam (VALIUM) 5 MG tablet TAKE ONE TABLET BY MOUTH EVERY 12 HOURS AS NEEDED FOR MUSCLE SPASMS OR ANXIETY 30 tablet 3   zolpidem (AMBIEN) 10 MG tablet Take 0.5-1 tablets (5-10 mg total) by mouth at bedtime as needed for sleep. 30 tablet 3   azithromycin (ZITHROMAX Z-PAK) 250 MG tablet As directed 6 tablet 0   benzonatate (TESSALON) 200 MG capsule Take 1 capsule (200 mg total) by mouth 3 (three) times daily as needed for cough. 20 capsule 0   promethazine-dextromethorphan (PROMETHAZINE-DM) 6.25-15 MG/5ML syrup Take 5 mLs by mouth 4 (four) times daily as needed for cough. 240 mL 0   No facility-administered medications prior to visit.    ROS: Review of Systems  Constitutional:  Negative for activity  change, appetite change, chills, fatigue and unexpected weight change.  HENT:  Negative for congestion, mouth sores and sinus pressure.   Eyes:  Negative for visual disturbance.  Respiratory:  Negative for cough and chest tightness.   Gastrointestinal:  Negative for abdominal pain and nausea.  Genitourinary:  Negative for difficulty urinating, frequency and vaginal pain.  Musculoskeletal:  Positive for back pain. Negative for gait problem.  Skin:  Negative for pallor and rash.  Neurological:  Negative for dizziness, tremors, weakness, numbness and headaches.  Psychiatric/Behavioral:  Positive for dysphoric mood. Negative for confusion, sleep disturbance and suicidal ideas. The patient is nervous/anxious.     Objective:  BP 120/78 (BP Location: Left Arm, Patient Position: Sitting, Cuff Size: Large)   Pulse 85   Temp 98.6 F (37 C) (Oral)   Ht 5\' 4"  (1.626 m)   Wt 185 lb (83.9 kg)   SpO2 95%   BMI 31.76 kg/m   BP Readings from Last 3 Encounters:  01/11/23 120/78  10/19/22 130/71  10/13/22 (!) 140/80    Wt Readings from Last 3 Encounters:  01/11/23 185 lb (83.9 kg)  10/19/22 179 lb (81.2 kg)  10/13/22 181 lb 8 oz (82.3 kg)    Physical Exam Constitutional:      General: She is not in acute distress.    Appearance:  She is well-developed. She is obese. She is not toxic-appearing.  HENT:     Head: Normocephalic.     Right Ear: External ear normal.     Left Ear: External ear normal.     Nose: Nose normal.  Eyes:     General:        Right eye: No discharge.        Left eye: No discharge.     Conjunctiva/sclera: Conjunctivae normal.     Pupils: Pupils are equal, round, and reactive to light.  Neck:     Thyroid: No thyromegaly.     Vascular: No JVD.     Trachea: No tracheal deviation.  Cardiovascular:     Rate and Rhythm: Normal rate and regular rhythm.     Heart sounds: Normal heart sounds.  Pulmonary:     Effort: No respiratory distress.     Breath sounds: No  stridor. No wheezing.  Abdominal:     General: Bowel sounds are normal. There is no distension.     Palpations: Abdomen is soft. There is no mass.     Tenderness: There is no abdominal tenderness. There is no guarding or rebound.  Musculoskeletal:        General: No tenderness.     Cervical back: Normal range of motion and neck supple. No rigidity.  Lymphadenopathy:     Cervical: No cervical adenopathy.  Skin:    Findings: No erythema or rash.  Neurological:     Cranial Nerves: No cranial nerve deficit.     Motor: No abnormal muscle tone.     Coordination: Coordination normal.     Deep Tendon Reflexes: Reflexes normal.  Psychiatric:        Behavior: Behavior normal.        Thought Content: Thought content normal.        Judgment: Judgment normal.     Lab Results  Component Value Date   WBC 8.9 05/09/2022   HGB 13.9 05/09/2022   HCT 40.5 05/09/2022   PLT 318.0 05/09/2022   GLUCOSE 98 10/13/2022   CHOL 240 (H) 05/09/2022   TRIG 168.0 (H) 05/09/2022   HDL 57.10 05/09/2022   LDLDIRECT 161.3 06/17/2011   LDLCALC 150 (H) 05/09/2022   ALT 19 10/13/2022   AST 21 10/13/2022   NA 138 10/13/2022   K 4.2 10/13/2022   CL 102 10/13/2022   CREATININE 0.87 10/13/2022   BUN 13 10/13/2022   CO2 27 10/13/2022   TSH 2.83 10/13/2022   INR 1.06 06/12/2018    MR ANGIO HEAD WO CONTRAST  Result Date: 04/28/2022 Table formatting from the original result was not included. [facility] NEUROIMAGING REPORT STUDY DATE: 04/26/22 PATIENT NAME: [name] DOB: [birth year] MRN: [id number] ORDERING CLINICIAN: Anson Fret, MD CLINICAL HISTORY: 82 y.o. year old female with: 1. Near syncope  2. Aphasia  3. Pulsatile tinnitus   EXAM: MR ANGIO HEAD WO CONTRAST TECHNIQUE: MR angiogram of the head was obtained utilizing 3D time of flight sequences from below the vertebrobasilar junction up to the intracranial vasculature without contrast.  Computerized reconstructions were  obtained. CONTRAST: Diagnostic Product Medications (last 720 hours) Showing orders from other encounters  Date/Time Action Medication Dose  04/26/22 1417 Contrast Given  gadobenate dimeglumine (MULTIHANCE) injection 17 mL 17 mL   COMPARISON: none IMAGING SITE: [facility] AT [address] [place] [address] FINDINGS: This study is of adequate technical quality. Flow signal of  the bilateral internal carotid arteries have no stenosis. The bilateral middle and anterior cerebral arteries have no stenosis. The bilateral vertebral, basilar, bilateral posterior cerebral arteries have no stenosis. No aneurysmal dilatations are seen.   Normal MRA head without contrast. INTERPRETING PHYSICIAN: Suanne Marker, MD Certified in Neurology, Neurophysiology and Neuroimaging [facility] [address] [place], [place] [address] [phone]  MR BRAIN W WO CONTRAST  Result Date: 04/28/2022 Table formatting from the original result was not included. [facility] NEUROIMAGING REPORT STUDY DATE: 04/26/22 PATIENT NAME: Keiona Gotay DOB: 20-Aug-1940 MRN: [id number] ORDERING CLINICIAN: Anson Fret, MD CLINICAL HISTORY: 82 y.o. year old female with: 1. Near syncope  2. Aphasia  3. Pulsatile tinnitus   EXAM: MR BRAIN W WO CONTRAST TECHNIQUE: MRI of the brain with and without contrast was obtained utilizing multiplanar, multiecho pulse sequences. CONTRAST: Diagnostic Product Medications (last 720 hours)   Date/Time Action Medication Dose  04/26/22 1417 Contrast Given  gadobenate dimeglumine (MULTIHANCE) injection 17 mL 17 mL   COMPARISON: 02/24/20 CT IMAGING SITE: [facility] AT [address] [place] [address] FINDINGS: No abnormal lesions are seen on diffusion-weighted views to suggest acute ischemia. The cortical sulci, fissures and cisterns are  perisylvian and moderate mesial temporal atrophy. Lateral, third and fourth ventricle are mildly enlarged on ex vacuo basis.  No extra-axial fluid collections are seen. No evidence of mass effect or midline shift.  Mild periventricular and subcortical foci of nonspecific gliosis.  No abnormal lesions are seen on post contrast views.  On sagittal views the posterior fossa, pituitary gland and corpus callosum are unremarkable. No evidence of intracranial hemorrhage on SWI views. The orbits and their contents, paranasal sinuses and calvarium are notable for bilateral lens extractions.  Intracranial flow voids are present.   MRI brain (with and without) demonstrating: -Mild to moderate atrophy and mild nonspecific gliosis. -No acute findings. INTERPRETING PHYSICIAN: Suanne Marker, MD Certified in Neurology, Neurophysiology and Neuroimaging [facility] [address] [place], [place] [address] [phone]   Assessment & Plan:   Problem List Items Addressed This Visit     Vitamin D deficiency    Risks associated with treatment noncompliance were discussed. Compliance was encouraged. Vit D 2000 iu/d      Obesity - Primary    Worse Diet discussed      Anxiety disorder    Chronic, situational.  Continue with  Wellbutrin.  Diazepam as needed-rare use.  Potential benefits of a long term benzodiazepines  use as well as potential risks  and complications were explained to the patient and were aknowledged. Pt declined SSRI, etc      Relevant Medications   diazepam (VALIUM) 5 MG tablet   B12 deficiency    On b12      Other Visit Diagnoses     Anxiety       Relevant Medications   diazepam (VALIUM) 5 MG tablet         Meds ordered this encounter  Medications   diazepam (VALIUM) 5 MG tablet    Sig: TAKE ONE TABLET BY MOUTH EVERY 12 HOURS AS NEEDED FOR MUSCLE SPASMS OR ANXIETY    Dispense:  30 tablet    Refill:  3   zolpidem (AMBIEN) 10 MG tablet    Sig:  Take 0.5-1 tablets (5-10 mg total) by mouth at bedtime as needed for sleep.    Dispense:  30 tablet  Refill:  3      Follow-up: Return in about 3 months (around 04/13/2023) for a follow-up visit.  Sonda Primes, MD

## 2023-01-11 NOTE — Assessment & Plan Note (Signed)
On b12 

## 2023-01-11 NOTE — Assessment & Plan Note (Signed)
Chronic, situational.  Continue with  Wellbutrin.  Diazepam as needed-rare use.  Potential benefits of a long term benzodiazepines  use as well as potential risks  and complications were explained to the patient and were aknowledged. Pt declined SSRI, etc

## 2023-01-11 NOTE — Assessment & Plan Note (Signed)
Worse Diet discussed 

## 2023-02-15 ENCOUNTER — Other Ambulatory Visit: Payer: Self-pay | Admitting: Hematology and Oncology

## 2023-02-15 DIAGNOSIS — Z1231 Encounter for screening mammogram for malignant neoplasm of breast: Secondary | ICD-10-CM

## 2023-03-16 ENCOUNTER — Encounter (INDEPENDENT_AMBULATORY_CARE_PROVIDER_SITE_OTHER): Payer: Self-pay

## 2023-03-23 ENCOUNTER — Ambulatory Visit
Admission: RE | Admit: 2023-03-23 | Discharge: 2023-03-23 | Disposition: A | Discharge status: Home / Self Care | Payer: Medicare Other | Source: Ambulatory Visit | Attending: Hematology and Oncology | Admitting: Hematology and Oncology

## 2023-03-23 DIAGNOSIS — Z1231 Encounter for screening mammogram for malignant neoplasm of breast: Secondary | ICD-10-CM

## 2023-03-28 ENCOUNTER — Inpatient Hospital Stay: Payer: Medicare Other | Attending: Hematology and Oncology | Admitting: Hematology and Oncology

## 2023-03-28 ENCOUNTER — Encounter: Payer: Self-pay | Admitting: Hematology and Oncology

## 2023-03-28 VITALS — BP 133/57 | HR 84 | Temp 98.0°F | Resp 16 | Wt 184.6 lb

## 2023-03-28 DIAGNOSIS — Z170 Estrogen receptor positive status [ER+]: Secondary | ICD-10-CM

## 2023-03-28 DIAGNOSIS — C50412 Malignant neoplasm of upper-outer quadrant of left female breast: Secondary | ICD-10-CM | POA: Diagnosis not present

## 2023-03-28 DIAGNOSIS — Z9221 Personal history of antineoplastic chemotherapy: Secondary | ICD-10-CM | POA: Insufficient documentation

## 2023-03-28 DIAGNOSIS — M8580 Other specified disorders of bone density and structure, unspecified site: Secondary | ICD-10-CM | POA: Insufficient documentation

## 2023-03-28 DIAGNOSIS — Z923 Personal history of irradiation: Secondary | ICD-10-CM | POA: Insufficient documentation

## 2023-03-28 DIAGNOSIS — Z90710 Acquired absence of both cervix and uterus: Secondary | ICD-10-CM | POA: Diagnosis not present

## 2023-03-28 DIAGNOSIS — Z853 Personal history of malignant neoplasm of breast: Secondary | ICD-10-CM | POA: Diagnosis present

## 2023-03-28 DIAGNOSIS — Z87891 Personal history of nicotine dependence: Secondary | ICD-10-CM | POA: Diagnosis not present
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
## 2023-04-13 ENCOUNTER — Ambulatory Visit (INDEPENDENT_AMBULATORY_CARE_PROVIDER_SITE_OTHER): Payer: Medicare Other | Admitting: Internal Medicine

## 2023-04-13 ENCOUNTER — Encounter: Payer: Self-pay | Admitting: Internal Medicine

## 2023-04-13 VITALS — BP 128/70 | HR 93 | Temp 97.8°F | Ht 64.0 in | Wt 186.4 lb

## 2023-04-13 DIAGNOSIS — E6609 Other obesity due to excess calories: Secondary | ICD-10-CM | POA: Diagnosis not present

## 2023-04-13 DIAGNOSIS — E785 Hyperlipidemia, unspecified: Secondary | ICD-10-CM

## 2023-04-13 DIAGNOSIS — R0609 Other forms of dyspnea: Secondary | ICD-10-CM | POA: Diagnosis not present

## 2023-04-13 DIAGNOSIS — F4321 Adjustment disorder with depressed mood: Secondary | ICD-10-CM | POA: Diagnosis not present

## 2023-04-13 DIAGNOSIS — Z6834 Body mass index (BMI) 34.0-34.9, adult: Secondary | ICD-10-CM

## 2023-04-13 DIAGNOSIS — Z23 Encounter for immunization: Secondary | ICD-10-CM

## 2023-04-13 MED ORDER — FLUOXETINE HCL 10 MG PO TABS: 10.0000 mg | ORAL_TABLET | Freq: Every day | ORAL | 5 refills | Status: DC

## 2023-04-13 NOTE — Assessment & Plan Note (Addendum)
Check  ECHO Cardiology ref Loose wt CXR was ok this year

## 2023-04-13 NOTE — Assessment & Plan Note (Signed)
Worse - depression, stress w/her son's imprisonment Start low dose Fluoxetine po

## 2023-04-13 NOTE — Progress Notes (Signed)
Subjective:  Patient ID: [name], female    DOB: Jul 17, 1941  Age: 82 y.o. MRN: [id number]  CC: No chief complaint on file.   HPI Neela Berkland presents for LBP, OA, stress Her RN cousin thinks she needs a stress test C/o DOE, depression, stress w/her son's imprisonment  Outpatient Medications Prior to Visit  Medication Sig Dispense Refill  . acetaminophen (TYLENOL) 500 MG tablet Take 1,000 mg by mouth every 6 (six) hours as needed.    Marland Kitchen amLODipine (NORVASC) 5 MG tablet Take 1 tablet (5 mg total) by mouth daily. 90 tablet 3  . amoxicillin (AMOXIL) 500 MG capsule Take 4 capsules 1 hour prior to a dental cleaning, dental work 20 capsule 1  . Ascorbic Acid (VITAMIN C PO) Take by mouth daily.    Marland Kitchen b complex vitamins tablet Take 1 tablet by mouth daily. 100 tablet 3  . Cholecalciferol (VITAMIN D3) 50 MCG (2000 UT) capsule Take 2 capsules (4,000 Units total) by mouth daily. 100 capsule 3  . diazepam (VALIUM) 5 MG tablet TAKE ONE TABLET BY MOUTH EVERY 12 HOURS AS NEEDED FOR MUSCLE SPASMS OR ANXIETY 30 tablet 3  . fexofenadine (ALLEGRA) 180 MG tablet Take 180 mg by mouth daily.    Marland Kitchen ibuprofen (ADVIL,MOTRIN) 400 MG tablet Take 400 mg by mouth every 6 (six) hours as needed.    . loratadine (CLARITIN) 10 MG tablet Take 1 tablet (10 mg total) by mouth daily. 30 tablet 11  . losartan (COZAAR) 100 MG tablet Take 1 tablet (100 mg total) by mouth daily. 90 tablet 3  . zolpidem (AMBIEN) 10 MG tablet Take 0.5-1 tablets (5-10 mg total) by mouth at bedtime as needed for sleep. 30 tablet 3   No facility-administered medications prior to visit.    ROS: Review of Systems  Constitutional:  Negative for activity change, appetite change, chills, fatigue and unexpected weight change.  HENT:  Negative for congestion, mouth sores and sinus pressure.   Eyes:  Negative for visual disturbance.  Respiratory:  Positive for shortness of breath. Negative for cough and chest tightness.    Cardiovascular:  Negative for chest pain and palpitations.  Gastrointestinal:  Negative for abdominal pain and nausea.  Genitourinary:  Negative for difficulty urinating, frequency and vaginal pain.  Musculoskeletal:  Positive for arthralgias, back pain and gait problem.  Skin:  Negative for pallor and rash.  Neurological:  Negative for dizziness, tremors, weakness, numbness and headaches.  Psychiatric/Behavioral:  Positive for decreased concentration and sleep disturbance. Negative for confusion, dysphoric mood and suicidal ideas. The patient is nervous/anxious.     Objective:  BP 128/70 (BP Location: Right Arm, Patient Position: Sitting, Cuff Size: Normal)   Pulse 93   Temp 97.8 F (36.6 C) (Oral)   Ht 5\' 4"  (1.626 m)   Wt 186 lb 6.4 oz (84.6 kg)   SpO2 98%   BMI 32.00 kg/m   BP Readings from Last 3 Encounters:  04/13/23 128/70  03/28/23 (!) 133/57  01/11/23 120/78    Wt Readings from Last 3 Encounters:  04/13/23 186 lb 6.4 oz (84.6 kg)  03/28/23 184 lb 9.6 oz (83.7 kg)  01/11/23 185 lb (83.9 kg)    Physical Exam Constitutional:      General: She is not in acute distress.    Appearance: She is well-developed. She is obese.  HENT:     Head: Normocephalic.     Right Ear: External ear normal.     Left Ear: External  ear normal.     Nose: Nose normal.  Eyes:     General:        Right eye: No discharge.        Left eye: No discharge.     Conjunctiva/sclera: Conjunctivae normal.     Pupils: Pupils are equal, round, and reactive to light.  Neck:     Thyroid: No thyromegaly.     Vascular: No JVD.     Trachea: No tracheal deviation.  Cardiovascular:     Rate and Rhythm: Normal rate and regular rhythm.     Heart sounds: Normal heart sounds.  Pulmonary:     Effort: No respiratory distress.     Breath sounds: No stridor. No wheezing.  Abdominal:     General: Bowel sounds are normal. There is no distension.     Palpations: Abdomen is soft. There is no mass.      Tenderness: There is no abdominal tenderness. There is no guarding or rebound.  Musculoskeletal:        General: No tenderness.     Cervical back: Normal range of motion and neck supple. No rigidity.     Right lower leg: No edema.     Left lower leg: No edema.  Lymphadenopathy:     Cervical: No cervical adenopathy.  Skin:    Findings: No erythema or rash.  Neurological:     Mental Status: She is oriented to person, place, and time.     Cranial Nerves: No cranial nerve deficit.     Motor: No weakness or abnormal muscle tone.     Coordination: Coordination abnormal.     Gait: Gait abnormal.     Deep Tendon Reflexes: Reflexes normal.  Psychiatric:        Behavior: Behavior normal.        Thought Content: Thought content normal.        Judgment: Judgment normal.     A total time of 45 minutes was spent preparing to see the patient, reviewing tests, x-rays, operative reports and other medical records.  Also, obtaining history and performing comprehensive physical exam.  Additionally, counseling the patient regarding the above listed issues - stress, DOE   Lab Results  Component Value Date   WBC 8.9 05/09/2022   HGB 13.9 05/09/2022   HCT 40.5 05/09/2022   PLT 318.0 05/09/2022   GLUCOSE 98 10/13/2022   CHOL 240 (H) 05/09/2022   TRIG 168.0 (H) 05/09/2022   HDL 57.10 05/09/2022   LDLDIRECT 161.3 06/17/2011   LDLCALC 150 (H) 05/09/2022   ALT 19 10/13/2022   AST 21 10/13/2022   NA 138 10/13/2022   K 4.2 10/13/2022   CL 102 10/13/2022   CREATININE 0.87 10/13/2022   BUN 13 10/13/2022   CO2 27 10/13/2022   TSH 2.83 10/13/2022   INR 1.06 06/12/2018    MM 3D SCREENING MAMMOGRAM BILATERAL BREAST  Result Date: 03/24/2023 CLINICAL DATA:  Screening. EXAM: DIGITAL SCREENING BILATERAL MAMMOGRAM WITH TOMOSYNTHESIS AND CAD TECHNIQUE: Bilateral screening digital craniocaudal and mediolateral oblique mammograms were obtained. Bilateral screening digital breast tomosynthesis was performed.  The images were evaluated with computer-aided detection. COMPARISON:  Previous exam(s). ACR Breast Density Category b: There are scattered areas of fibroglandular density. FINDINGS: There are no findings suspicious for malignancy. IMPRESSION: No mammographic evidence of malignancy. A result letter of this screening mammogram will be mailed directly to the patient. RECOMMENDATION: Screening mammogram in one year. (Code:SM-B-01Y) BI-RADS CATEGORY  1: Negative. Electronically Signed   By:  Ted Mcalpine M.D.   On: 03/24/2023 13:15    Assessment & Plan:   Problem List Items Addressed This Visit     Dyslipidemia   Relevant Orders   TSH   Lipid panel   Urinalysis   Obesity    Worse Pt needs to loose wt      DOE (dyspnea on exertion) - Primary    Check  ECHO Cardiology ref Loose wt CXR was ok this year      Relevant Orders   ECHOCARDIOGRAM COMPLETE   Ambulatory referral to Cardiology   Comprehensive metabolic panel   CBC with Differential/Platelet   TSH   Lipid panel   Urinalysis   Situational depression    Worse - depression, stress w/her son's imprisonment Start low dose Fluoxetine po      Relevant Medications   FLUoxetine (PROZAC) 10 MG tablet   Other Relevant Orders   Comprehensive metabolic panel   CBC with Differential/Platelet   TSH   Lipid panel   Urinalysis   Other Visit Diagnoses     Need for vaccination       Relevant Orders   Flu Vaccine Trivalent High Dose (Fluad) (Completed)         Meds ordered this encounter  Medications  . FLUoxetine (PROZAC) 10 MG tablet    Sig: Take 1 tablet (10 mg total) by mouth daily.    Dispense:  30 tablet    Refill:  5      Follow-up: Return in about 6 weeks (around 05/25/2023) for a follow-up visit.  Sonda Primes, MD

## 2023-04-13 NOTE — Assessment & Plan Note (Signed)
Worse Pt needs to loose wt

## 2023-05-14 ENCOUNTER — Other Ambulatory Visit: Payer: Self-pay | Admitting: Internal Medicine

## 2023-05-15 ENCOUNTER — Ambulatory Visit (HOSPITAL_COMMUNITY): Payer: Medicare Other | Attending: Internal Medicine

## 2023-05-15 DIAGNOSIS — R0609 Other forms of dyspnea: Secondary | ICD-10-CM | POA: Insufficient documentation

## 2023-05-15 LAB — ECHOCARDIOGRAM COMPLETE
Area-P 1/2: 5.06 cm2
P 1/2 time: 379 ms
S' Lateral: 2.1 cm

## 2023-05-23 ENCOUNTER — Ambulatory Visit (INDEPENDENT_AMBULATORY_CARE_PROVIDER_SITE_OTHER): Payer: Medicare Other

## 2023-05-23 VITALS — Ht 64.0 in | Wt 185.0 lb

## 2023-05-23 DIAGNOSIS — Z0000 Encounter for general adult medical examination without abnormal findings: Secondary | ICD-10-CM | POA: Diagnosis not present
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
## 2023-05-23 NOTE — Patient Instructions (Signed)
Ms. Ansara , Thank you for taking time to come for your Medicare Wellness Visit. I appreciate your ongoing commitment to your health goals. Please review the following plan we discussed and let me know if I can assist you in the future.   Referrals/Orders/Follow-Ups/Clinician Recommendations: No  This is a list of the screening recommended for you and due dates:  Health Maintenance  Topic Date Due   DTaP/Tdap/Td vaccine (1 - Tdap) Never done   Zoster (Shingles) Vaccine (1 of 2) Never done   Pap Smear  06/16/2017   COVID-19 Vaccine (5 - 2023-24 season) 04/02/2023   Mammogram  03/22/2024   Medicare Annual Wellness Visit  05/22/2024   Pneumonia Vaccine  Completed   Flu Shot  Completed   DEXA scan (bone density measurement)  Completed   HPV Vaccine  Aged Out    Advanced directives: (Copy Requested) Please bring a copy of your health care power of attorney and living will to the office to be added to your chart at your convenience.  Next Medicare Annual Wellness Visit scheduled for next year: No

## 2023-05-25 ENCOUNTER — Ambulatory Visit: Payer: Medicare Other | Admitting: Internal Medicine

## 2023-05-25 ENCOUNTER — Encounter: Payer: Self-pay | Admitting: Internal Medicine

## 2023-05-25 VITALS — BP 122/78 | HR 103 | Temp 98.6°F | Ht 64.0 in | Wt 186.0 lb

## 2023-05-25 DIAGNOSIS — Z6834 Body mass index (BMI) 34.0-34.9, adult: Secondary | ICD-10-CM

## 2023-05-25 DIAGNOSIS — R0609 Other forms of dyspnea: Secondary | ICD-10-CM | POA: Diagnosis not present

## 2023-05-25 DIAGNOSIS — E538 Deficiency of other specified B group vitamins: Secondary | ICD-10-CM

## 2023-05-25 DIAGNOSIS — E6609 Other obesity due to excess calories: Secondary | ICD-10-CM

## 2023-05-25 DIAGNOSIS — E785 Hyperlipidemia, unspecified: Secondary | ICD-10-CM

## 2023-05-25 DIAGNOSIS — I10 Essential (primary) hypertension: Secondary | ICD-10-CM | POA: Diagnosis not present

## 2023-05-25 DIAGNOSIS — E66811 Obesity, class 1: Secondary | ICD-10-CM

## 2023-05-25 NOTE — Assessment & Plan Note (Signed)
On B12 

## 2023-05-25 NOTE — Assessment & Plan Note (Signed)
Cont on losartan and amlodipine

## 2023-05-25 NOTE — Progress Notes (Signed)
Subjective:  Patient ID: [name], female    DOB: 08/07/1940  Age: 82 y.o. MRN: [id number]  CC: Medical Management of Chronic Issues (6 WEEK F/U)   HPI [name] presents for DOE, depression, insomnia  Outpatient Medications Prior to Visit  Medication Sig Dispense Refill   acetaminophen (TYLENOL) 500 MG tablet Take 1,000 mg by mouth every 6 (six) hours as needed.     amLODipine (NORVASC) 5 MG tablet TAKE 1 TABLET BY MOUTH DAILY 90 tablet 3   Ascorbic Acid (VITAMIN C PO) Take by mouth daily.     b complex vitamins tablet Take 1 tablet by mouth daily. 100 tablet 3   Cholecalciferol (VITAMIN D3) 50 MCG (2000 UT) capsule Take 2 capsules (4,000 Units total) by mouth daily. 100 capsule 3   diazepam (VALIUM) 5 MG tablet TAKE ONE TABLET BY MOUTH EVERY 12 HOURS AS NEEDED FOR MUSCLE SPASMS OR ANXIETY 30 tablet 3   fexofenadine (ALLEGRA) 180 MG tablet Take 180 mg by mouth daily.     FLUoxetine (PROZAC) 10 MG tablet Take 1 tablet (10 mg total) by mouth daily. 30 tablet 5   ibuprofen (ADVIL,MOTRIN) 400 MG tablet Take 400 mg by mouth every 6 (six) hours as needed.     loratadine (CLARITIN) 10 MG tablet Take 1 tablet (10 mg total) by mouth daily. 30 tablet 11   losartan (COZAAR) 100 MG tablet TAKE 1 TABLET BY MOUTH DAILY 90 tablet 3   zolpidem (AMBIEN) 10 MG tablet Take 0.5-1 tablets (5-10 mg total) by mouth at bedtime as needed for sleep. 30 tablet 3   amoxicillin (AMOXIL) 500 MG capsule Take 4 capsules 1 hour prior to a dental cleaning, dental work (Patient not taking: Reported on 05/25/2023) 20 capsule 1   No facility-administered medications prior to visit.    ROS: Review of Systems  Constitutional:  Positive for fatigue. Negative for activity change, appetite change, chills and unexpected weight change.  HENT:  Negative for congestion, mouth sores and sinus pressure.   Eyes:  Negative for visual disturbance.  Respiratory:  Positive for shortness of breath.  Negative for cough and chest tightness.   Cardiovascular:  Negative for chest pain and leg swelling.  Gastrointestinal:  Negative for abdominal pain and nausea.  Genitourinary:  Negative for difficulty urinating, frequency and vaginal pain.  Musculoskeletal:  Negative for back pain and gait problem.  Skin:  Negative for pallor and rash.  Neurological:  Negative for dizziness, tremors, weakness, numbness and headaches.  Psychiatric/Behavioral:  Negative for confusion, sleep disturbance and suicidal ideas. The patient is not nervous/anxious.     Objective:  BP 122/78 (BP Location: Left Arm, Patient Position: Sitting, Cuff Size: Normal)   Pulse (!) 103   Temp 98.6 F (37 C) (Oral)   Ht 5\' 4"  (1.626 m)   Wt 186 lb (84.4 kg)   SpO2 97%   BMI 31.93 kg/m   BP Readings from Last 3 Encounters:  05/25/23 122/78  04/13/23 128/70  03/28/23 (!) 133/57    Wt Readings from Last 3 Encounters:  05/25/23 186 lb (84.4 kg)  05/23/23 185 lb (83.9 kg)  04/13/23 186 lb 6.4 oz (84.6 kg)    Physical Exam Constitutional:      General: She is not in acute distress.    Appearance: She is well-developed. She is obese.  HENT:     Head: Normocephalic.     Right Ear: External ear normal.     Left Ear: External ear  normal.     Nose: Nose normal.  Eyes:     General:        Right eye: No discharge.        Left eye: No discharge.     Conjunctiva/sclera: Conjunctivae normal.     Pupils: Pupils are equal, round, and reactive to light.  Neck:     Thyroid: No thyromegaly.     Vascular: No JVD.     Trachea: No tracheal deviation.  Cardiovascular:     Rate and Rhythm: Normal rate and regular rhythm.     Heart sounds: Normal heart sounds.  Pulmonary:     Effort: No respiratory distress.     Breath sounds: No stridor. No wheezing.  Abdominal:     General: Bowel sounds are normal. There is no distension.     Palpations: Abdomen is soft. There is no mass.     Tenderness: There is no abdominal  tenderness. There is no guarding or rebound.  Musculoskeletal:        General: No tenderness.     Cervical back: Normal range of motion and neck supple. No rigidity.     Right lower leg: No edema.     Left lower leg: No edema.  Lymphadenopathy:     Cervical: No cervical adenopathy.  Skin:    Findings: No erythema or rash.  Neurological:     Mental Status: She is oriented to person, place, and time.     Cranial Nerves: No cranial nerve deficit.     Motor: No abnormal muscle tone.     Coordination: Coordination normal.     Deep Tendon Reflexes: Reflexes normal.  Psychiatric:        Behavior: Behavior normal.        Thought Content: Thought content normal.        Judgment: Judgment normal.     Lab Results  Component Value Date   WBC 8.9 05/09/2022   HGB 13.9 05/09/2022   HCT 40.5 05/09/2022   PLT 318.0 05/09/2022   GLUCOSE 98 10/13/2022   CHOL 240 (H) 05/09/2022   TRIG 168.0 (H) 05/09/2022   HDL 57.10 05/09/2022   LDLDIRECT 161.3 06/17/2011   LDLCALC 150 (H) 05/09/2022   ALT 19 10/13/2022   AST 21 10/13/2022   NA 138 10/13/2022   K 4.2 10/13/2022   CL 102 10/13/2022   CREATININE 0.87 10/13/2022   BUN 13 10/13/2022   CO2 27 10/13/2022   TSH 2.83 10/13/2022   INR 1.06 06/12/2018    MM 3D SCREENING MAMMOGRAM BILATERAL BREAST  Result Date: 03/24/2023 CLINICAL DATA:  Screening. EXAM: DIGITAL SCREENING BILATERAL MAMMOGRAM WITH TOMOSYNTHESIS AND CAD TECHNIQUE: Bilateral screening digital craniocaudal and mediolateral oblique mammograms were obtained. Bilateral screening digital breast tomosynthesis was performed. The images were evaluated with computer-aided detection. COMPARISON:  Previous exam(s). ACR Breast Density Category b: There are scattered areas of fibroglandular density. FINDINGS: There are no findings suspicious for malignancy. IMPRESSION: No mammographic evidence of malignancy. A result letter of this screening mammogram will be mailed directly to the patient.  RECOMMENDATION: Screening mammogram in one year. (Code:SM-B-01Y) BI-RADS CATEGORY  1: Negative. Electronically Signed   By: Ted Mcalpine M.D.   On: 03/24/2023 13:15    Assessment & Plan:   Problem List Items Addressed This Visit     Dyslipidemia    On diet Pt declined statins      Obesity - Primary    Intermittent fasting suggested      B12  deficiency    On B12      HTN (hypertension)    Cont on losartan and amlodipine      DOE (dyspnea on exertion)    ECHO was [facility] Cardiology appt pending         No orders of the defined types were placed in this encounter.     Follow-up: Return in about 3 months (around 08/25/2023) for a follow-up visit.  Sonda Primes, MD

## 2023-05-25 NOTE — Patient Instructions (Addendum)
Call [facility] --- Phone: [phone]

## 2023-05-25 NOTE — Assessment & Plan Note (Signed)
ECHO was [facility] Cardiology appt pending

## 2023-05-25 NOTE — Assessment & Plan Note (Signed)
Intermittent fasting suggested ?

## 2023-05-25 NOTE — Assessment & Plan Note (Signed)
On diet Pt declined statins

## 2023-07-12 ENCOUNTER — Other Ambulatory Visit: Payer: Self-pay | Admitting: Internal Medicine

## 2023-07-17 ENCOUNTER — Encounter: Payer: Self-pay | Admitting: Internal Medicine

## 2023-07-17 ENCOUNTER — Telehealth: Payer: Self-pay | Admitting: Internal Medicine

## 2023-07-17 NOTE — Telephone Encounter (Signed)
Patient called back and said she is out of this medication

## 2023-07-17 NOTE — Telephone Encounter (Signed)
Prescription Request  07/17/2023  LOV: 05/25/2023  What is the name of the medication or equipment? fluoxetine  Have you contacted your pharmacy to request a refill? Yes   Which pharmacy would you like this sent to?  [id number] [place], [place] - [address] [place] [address] Phone: [phone] Fax: [phone]     Patient notified that their request is being sent to the clinical staff for review and that they should receive a response within 2 business days.   Please advise at Mobile [phone] (mobile)

## 2023-07-19 MED ORDER — FLUOXETINE HCL 10 MG PO TABS: 10.0000 mg | ORAL_TABLET | Freq: Every day | ORAL | 5 refills | Status: DC

## 2023-07-19 NOTE — Telephone Encounter (Signed)
Okay. Thank you.

## 2023-08-08 ENCOUNTER — Other Ambulatory Visit: Payer: Self-pay | Admitting: Internal Medicine

## 2023-08-08 ENCOUNTER — Telehealth: Payer: Self-pay | Admitting: Internal Medicine

## 2023-08-08 MED ORDER — AMOXICILLIN 500 MG PO CAPS: ORAL_CAPSULE | ORAL | 1 refills | Status: AC

## 2023-08-08 NOTE — Telephone Encounter (Signed)
 Copied from CRM [id number]. Topic: Clinical - Medication Refill >> Aug 08, 2023 12:31 PM Rolin D wrote: Most Recent Primary Care Visit:  Provider: PLOTNIKOV, ALEKSEI V  Department: [facility]  Visit Type: OFFICE VISIT  Date: 05/25/2023  Medication: amoxicillin  (AMOXIL ) 500 MG capsule   Has the patient contacted their pharmacy? No (Agent: If no, request that the patient contact the pharmacy for the refill. If patient does not wish to contact the pharmacy document the reason why and proceed with request.) (Agent: If yes, when and what did the pharmacy advise?)  Is this the correct pharmacy for this prescription? Yes If no, delete pharmacy and type the correct one.  This is the patient's preferred pharmacy:    [facility] [id number] - [place], [place] - [address] [place] [address] Phone: [phone] Fax: [phone]   Has the prescription been filled recently? No  Is the patient out of the medication? Yes  Has the patient been seen for an appointment in the last year OR does the patient have an upcoming appointment? Yes  Can we respond through MyChart? Yes  Agent: Please be advised that Rx refills may take up to 3 business days. We ask that you follow-up with your pharmacy.

## 2023-08-08 NOTE — Telephone Encounter (Signed)
 Copied from CRM [id number]. Topic: Clinical - Prescription Issue >> Aug 08, 2023  3:54 PM Victoria A wrote: Reason for CRM: Patient was calling to see if her Amoxicillin  Prescription was called in to the pharmacy because she needs it before she goes to the Dentist tomorrow.

## 2023-08-14 ENCOUNTER — Telehealth: Payer: Self-pay

## 2023-08-14 NOTE — Telephone Encounter (Signed)
 Copied from CRM [id number]. Topic: Clinical - Medical Advice >> Aug 14, 2023  9:45 AM Deaijah H wrote:  Reason for CRM: Patient called in stating neighbor tested positive for covid last night and Would like to know where to get tested for covid and when should she try to get test. Stated she feel fine but would like to know / please call [phone]

## 2023-08-15 NOTE — Telephone Encounter (Signed)
 I was able to speak with pt and inform her of where she can purchase COVID test.

## 2023-08-24 ENCOUNTER — Ambulatory Visit: Payer: Medicare Other | Admitting: Internal Medicine

## 2023-08-24 ENCOUNTER — Encounter: Payer: Self-pay | Admitting: Internal Medicine

## 2023-08-24 VITALS — BP 110/60 | HR 134 | Temp 98.0°F | Ht 64.0 in | Wt 180.0 lb

## 2023-08-24 DIAGNOSIS — R413 Other amnesia: Secondary | ICD-10-CM

## 2023-08-24 DIAGNOSIS — E538 Deficiency of other specified B group vitamins: Secondary | ICD-10-CM

## 2023-08-24 DIAGNOSIS — F413 Other mixed anxiety disorders: Secondary | ICD-10-CM

## 2023-08-24 DIAGNOSIS — Z170 Estrogen receptor positive status [ER+]: Secondary | ICD-10-CM

## 2023-08-24 DIAGNOSIS — G8929 Other chronic pain: Secondary | ICD-10-CM

## 2023-08-24 DIAGNOSIS — M1990 Unspecified osteoarthritis, unspecified site: Secondary | ICD-10-CM | POA: Diagnosis not present

## 2023-08-24 DIAGNOSIS — M5440 Lumbago with sciatica, unspecified side: Secondary | ICD-10-CM

## 2023-08-24 DIAGNOSIS — C50412 Malignant neoplasm of upper-outer quadrant of left female breast: Secondary | ICD-10-CM

## 2023-08-24 NOTE — Assessment & Plan Note (Signed)
On B12 

## 2023-08-24 NOTE — Assessment & Plan Note (Signed)
Chronic, situational.  Continue with  Wellbutrin.  Diazepam as needed-rare use.  Potential benefits of a long term benzodiazepines  use as well as potential risks  and complications were explained to the patient and were aknowledged. Pt declined SSRI, etc

## 2023-08-24 NOTE — Assessment & Plan Note (Signed)
Chronic LBP, hip pains, knees  Norco prn - pt stopped; Tylenol prn  Potential benefits of a long term opioids use as well as potential risks (i.e. addiction risk, apnea etc) and complications (i.e. Somnolence, constipation and others) were explained to the patient and were aknowledged. Diazepm prn spasms  Potential benefits of a long term benzodiazepines  use as well as potential risks  and complications were explained to the patient and were aknowledged.

## 2023-08-24 NOTE — Progress Notes (Signed)
Subjective:  Patient ID: Girtha Sergent, female    DOB: 03/11/1941  Age: 83 y.o. MRN: [id number]  CC: Medical Management of Chronic Issues (3 mnth f/u)   HPI Bless Hanselman presents for LBP, HTN, depression about son Eddie  Outpatient Medications Prior to Visit  Medication Sig Dispense Refill   acetaminophen (TYLENOL) 500 MG tablet Take 1,000 mg by mouth every 6 (six) hours as needed.     amLODipine (NORVASC) 5 MG tablet TAKE 1 TABLET BY MOUTH DAILY 90 tablet 3   amoxicillin (AMOXIL) 500 MG capsule Take 4 capsules 1 hour prior to a dental cleaning, dental work 20 capsule 1   Ascorbic Acid (VITAMIN C PO) Take by mouth daily.     b complex vitamins tablet Take 1 tablet by mouth daily. 100 tablet 3   Cholecalciferol (VITAMIN D3) 50 MCG (2000 UT) capsule Take 2 capsules (4,000 Units total) by mouth daily. 100 capsule 3   diazepam (VALIUM) 5 MG tablet TAKE ONE TABLET BY MOUTH EVERY 12 HOURS AS NEEDED FOR MUSCLE SPASMS OR ANXIETY 30 tablet 3   fexofenadine (ALLEGRA) 180 MG tablet Take 180 mg by mouth daily.     FLUoxetine (PROZAC) 10 MG tablet Take 1 tablet (10 mg total) by mouth daily. 30 tablet 5   ibuprofen (ADVIL,MOTRIN) 400 MG tablet Take 400 mg by mouth every 6 (six) hours as needed.     latanoprost (XALATAN) 0.005 % ophthalmic solution Apply to eye.     loratadine (CLARITIN) 10 MG tablet Take 1 tablet (10 mg total) by mouth daily. 30 tablet 11   losartan (COZAAR) 100 MG tablet TAKE 1 TABLET BY MOUTH DAILY 90 tablet 3   zolpidem (AMBIEN) 10 MG tablet TAKE 1/2 TO 1 TABLET BY MOUTH AT BEDTIME AS NEEDED FOR SLEEP 30 tablet 3   No facility-administered medications prior to visit.    ROS: Review of Systems  Constitutional:  Negative for activity change, appetite change, chills, fatigue and unexpected weight change.  HENT:  Negative for congestion, mouth sores and sinus pressure.   Eyes:  Negative for visual disturbance.  Respiratory:  Negative for cough and chest  tightness.   Gastrointestinal:  Negative for abdominal pain and nausea.  Genitourinary:  Negative for difficulty urinating, frequency and vaginal pain.  Musculoskeletal:  Positive for back pain. Negative for gait problem.  Skin:  Negative for pallor and rash.  Neurological:  Negative for dizziness, tremors, weakness, numbness and headaches.  Hematological:  Does not bruise/bleed easily.  Psychiatric/Behavioral:  Positive for decreased concentration and dysphoric mood. Negative for confusion, sleep disturbance and suicidal ideas. The patient is nervous/anxious.     Objective:  BP 110/60 (BP Location: Left Arm, Patient Position: Sitting, Cuff Size: Normal)   Pulse (!) 134   Temp 98 F (36.7 C) (Oral)   Ht 5\' 4"  (1.626 m)   Wt 180 lb (81.6 kg)   SpO2 98%   BMI 30.90 kg/m   BP Readings from Last 3 Encounters:  08/24/23 110/60  05/25/23 122/78  04/13/23 128/70    Wt Readings from Last 3 Encounters:  08/24/23 180 lb (81.6 kg)  05/25/23 186 lb (84.4 kg)  05/23/23 185 lb (83.9 kg)    Physical Exam Constitutional:      General: She is not in acute distress.    Appearance: She is well-developed. She is obese.  HENT:     Head: Normocephalic.     Right Ear: External ear normal.     Left  Ear: External ear normal.     Nose: Nose normal.  Eyes:     General:        Right eye: No discharge.        Left eye: No discharge.     Conjunctiva/sclera: Conjunctivae normal.     Pupils: Pupils are equal, round, and reactive to light.  Neck:     Thyroid: No thyromegaly.     Vascular: No JVD.     Trachea: No tracheal deviation.  Cardiovascular:     Rate and Rhythm: Normal rate and regular rhythm.     Heart sounds: Normal heart sounds.  Pulmonary:     Effort: No respiratory distress.     Breath sounds: No stridor. No wheezing.  Abdominal:     General: Bowel sounds are normal. There is no distension.     Palpations: Abdomen is soft. There is no mass.     Tenderness: There is no  abdominal tenderness. There is no guarding or rebound.  Musculoskeletal:        General: Tenderness present.     Cervical back: Normal range of motion and neck supple. No rigidity.     Right lower leg: No edema.     Left lower leg: No edema.  Lymphadenopathy:     Cervical: No cervical adenopathy.  Skin:    Findings: No erythema or rash.  Neurological:     Mental Status: Mental status is at baseline.     Cranial Nerves: No cranial nerve deficit.     Motor: No abnormal muscle tone.     Coordination: Coordination normal.     Gait: Gait abnormal.     Deep Tendon Reflexes: Reflexes normal.  Psychiatric:        Behavior: Behavior normal.        Thought Content: Thought content normal.        Judgment: Judgment normal.    Antalgic gait   Lab Results  Component Value Date   WBC 8.9 05/09/2022   HGB 13.9 05/09/2022   HCT 40.5 05/09/2022   PLT 318.0 05/09/2022   GLUCOSE 98 10/13/2022   CHOL 240 (H) 05/09/2022   TRIG 168.0 (H) 05/09/2022   HDL 57.10 05/09/2022   LDLDIRECT 161.3 06/17/2011   LDLCALC 150 (H) 05/09/2022   ALT 19 10/13/2022   AST 21 10/13/2022   NA 138 10/13/2022   K 4.2 10/13/2022   CL 102 10/13/2022   CREATININE 0.87 10/13/2022   BUN 13 10/13/2022   CO2 27 10/13/2022   TSH 2.83 10/13/2022   INR 1.06 06/12/2018    MM 3D SCREENING MAMMOGRAM BILATERAL BREAST Result Date: 03/24/2023 CLINICAL DATA:  Screening. EXAM: DIGITAL SCREENING BILATERAL MAMMOGRAM WITH TOMOSYNTHESIS AND CAD TECHNIQUE: Bilateral screening digital craniocaudal and mediolateral oblique mammograms were obtained. Bilateral screening digital breast tomosynthesis was performed. The images were evaluated with computer-aided detection. COMPARISON:  Previous exam(s). ACR Breast Density Category b: There are scattered areas of fibroglandular density. FINDINGS: There are no findings suspicious for malignancy. IMPRESSION: No mammographic evidence of malignancy. A result letter of this screening mammogram  will be mailed directly to the patient. RECOMMENDATION: Screening mammogram in one year. (Code:SM-B-01Y) BI-RADS CATEGORY  1: Negative. Electronically Signed   By: Ted Mcalpine M.D.   On: 03/24/2023 13:15    Assessment & Plan:   Problem List Items Addressed This Visit     Anxiety disorder - Primary   Chronic, situational.  Continue with  Wellbutrin.  Diazepam as needed-rare use.  Potential  benefits of a long term benzodiazepines  use as well as potential risks  and complications were explained to the patient and were aknowledged. Pt declined SSRI, etc      Osteoarthritis   Chronic LBP, hip pains, knees  Norco prn - pt stopped; Tylenol prn  Potential benefits of a long term opioids use as well as potential risks (i.e. addiction risk, apnea etc) and complications (i.e. Somnolence, constipation and others) were explained to the patient and were aknowledged. Diazepm prn spasms  Potential benefits of a long term benzodiazepines  use as well as potential risks  and complications were explained to the patient and were aknowledged.      Chronic low back pain   Norco prn - pt stopped; Tylenol prn      B12 deficiency   On B12      Memory impairment   Use electric toothbrush      Malignant neoplasm of upper-outer quadrant of left breast in female, estrogen receptor positive (HCC)   Off Arimidex  F/u w/Dr Oncology - Dr Al Pimple         No orders of the defined types were placed in this encounter.     Follow-up: Return in about 3 months (around 11/22/2023) for a follow-up visit.  Sonda Primes, MD

## 2023-08-24 NOTE — Assessment & Plan Note (Addendum)
Use electric toothbrush

## 2023-08-24 NOTE — Assessment & Plan Note (Signed)
Norco prn - pt stopped; Tylenol prn

## 2023-08-24 NOTE — Assessment & Plan Note (Addendum)
Off Arimidex  F/u w/Dr Oncology - Dr Al Pimple

## 2023-08-24 NOTE — Patient Instructions (Signed)
You can use electric toothbrush

## 2023-11-11 ENCOUNTER — Other Ambulatory Visit: Payer: Self-pay | Admitting: Internal Medicine

## 2023-11-22 ENCOUNTER — Encounter: Payer: Self-pay | Admitting: Internal Medicine

## 2023-11-22 ENCOUNTER — Ambulatory Visit (INDEPENDENT_AMBULATORY_CARE_PROVIDER_SITE_OTHER): Payer: Medicare Other | Admitting: Internal Medicine

## 2023-11-22 VITALS — BP 124/68 | HR 81 | Temp 98.0°F | Ht 64.0 in | Wt 177.0 lb

## 2023-11-22 DIAGNOSIS — E785 Hyperlipidemia, unspecified: Secondary | ICD-10-CM

## 2023-11-22 DIAGNOSIS — F413 Other mixed anxiety disorders: Secondary | ICD-10-CM

## 2023-11-22 DIAGNOSIS — Z6834 Body mass index (BMI) 34.0-34.9, adult: Secondary | ICD-10-CM

## 2023-11-22 DIAGNOSIS — F4323 Adjustment disorder with mixed anxiety and depressed mood: Secondary | ICD-10-CM

## 2023-11-22 DIAGNOSIS — E6609 Other obesity due to excess calories: Secondary | ICD-10-CM

## 2023-11-22 DIAGNOSIS — R0609 Other forms of dyspnea: Secondary | ICD-10-CM | POA: Diagnosis not present

## 2023-11-22 DIAGNOSIS — M5440 Lumbago with sciatica, unspecified side: Secondary | ICD-10-CM | POA: Diagnosis not present

## 2023-11-22 DIAGNOSIS — G8929 Other chronic pain: Secondary | ICD-10-CM

## 2023-11-22 DIAGNOSIS — E538 Deficiency of other specified B group vitamins: Secondary | ICD-10-CM

## 2023-11-22 DIAGNOSIS — F4321 Adjustment disorder with depressed mood: Secondary | ICD-10-CM

## 2023-11-22 DIAGNOSIS — E66811 Obesity, class 1: Secondary | ICD-10-CM

## 2023-11-22 LAB — COMPREHENSIVE METABOLIC PANEL WITH GFR
ALT: 13 U/L (ref 0–35)
AST: 16 U/L (ref 0–37)
Albumin: 4.2 g/dL (ref 3.5–5.2)
Alkaline Phosphatase: 80 U/L (ref 39–117)
BUN: 9 mg/dL (ref 6–23)
CO2: 29 meq/L (ref 19–32)
Calcium: 9.5 mg/dL (ref 8.4–10.5)
Chloride: 102 meq/L (ref 96–112)
Creatinine, Ser: 0.79 mg/dL (ref 0.40–1.20)
GFR: 68.86 mL/min (ref >60.00)
Glucose, Bld: 93 mg/dL (ref 70–99)
Potassium: 4 meq/L (ref 3.5–5.1)
Sodium: 138 meq/L (ref 135–145)
Total Bilirubin: 0.5 mg/dL (ref 0.2–1.2)
Total Protein: 7.5 g/dL (ref 6.0–8.3)

## 2023-11-22 LAB — CBC WITH DIFFERENTIAL/PLATELET
Basophils Absolute: 0.1 10*3/uL (ref 0.0–0.1)
Basophils Relative: 0.6 % (ref 0.0–3.0)
Eosinophils Absolute: 0.1 10*3/uL (ref 0.0–0.7)
Eosinophils Relative: 1.3 % (ref 0.0–5.0)
HCT: 41.6 % (ref 36.0–46.0)
Hemoglobin: 14 g/dL (ref 12.0–15.0)
Lymphocytes Relative: 28.3 % (ref 12.0–46.0)
Lymphs Abs: 2.6 10*3/uL (ref 0.7–4.0)
MCHC: 33.6 g/dL (ref 30.0–36.0)
MCV: 95.7 fl (ref 78.0–100.0)
Monocytes Absolute: 0.9 10*3/uL (ref 0.1–1.0)
Monocytes Relative: 9.2 % (ref 3.0–12.0)
Neutro Abs: 5.7 10*3/uL (ref 1.4–7.7)
Neutrophils Relative %: 60.6 % (ref 43.0–77.0)
Platelets: 318 10*3/uL (ref 150.0–400.0)
RBC: 4.35 Mil/uL (ref 3.87–5.11)
RDW: 13.3 % (ref 11.5–15.5)
WBC: 9.3 10*3/uL (ref 4.0–10.5)

## 2023-11-22 LAB — LIPID PANEL
Cholesterol: 231 mg/dL — ABNORMAL HIGH (ref 0–200)
HDL: 53.8 mg/dL (ref >39.00)
LDL Cholesterol: 143 mg/dL — ABNORMAL HIGH (ref 0–99)
NonHDL: 177.55
Total CHOL/HDL Ratio: 4
Triglycerides: 173 mg/dL — ABNORMAL HIGH (ref 0.0–149.0)
VLDL: 34.6 mg/dL (ref 0.0–40.0)

## 2023-11-22 LAB — TSH: TSH: 2.59 u[IU]/mL (ref 0.35–5.50)

## 2023-11-22 MED ORDER — TEMAZEPAM 30 MG PO CAPS
30.0000 mg | ORAL_CAPSULE | Freq: Every evening | ORAL | 5 refills | Status: DC | PRN
Start: 2023-11-22 — End: 2024-01-11

## 2023-11-22 NOTE — Assessment & Plan Note (Signed)
 Wt Readings from Last 3 Encounters:  11/22/23 177 lb (80.3 kg)  08/24/23 180 lb (81.6 kg)  05/25/23 186 lb (84.4 kg)

## 2023-11-22 NOTE — Assessment & Plan Note (Signed)
 Diazepam prn  Potential benefits of a long term benzodiazepines  use as well as potential risks  and complications were explained to the patient and were aknowledged.

## 2023-11-22 NOTE — Assessment & Plan Note (Signed)
Chronic, situational.  Continue with  Wellbutrin.  Diazepam as needed-rare use.  Potential benefits of a long term benzodiazepines  use as well as potential risks  and complications were explained to the patient and were aknowledged. Pt declined SSRI, etc

## 2023-11-22 NOTE — Assessment & Plan Note (Signed)
 On B12

## 2023-11-22 NOTE — Progress Notes (Signed)
 Subjective:  Patient ID: [name], female    DOB: 30-Sep-1940  Age: 83 y.o. MRN: [id number]  CC: Medical Management of Chronic Issues (3 Month follow up. Patient notes of ongoing issues with sleep (almost 4 hours worth).  Currently taking zolpidem )   HPI [name] presents for ongoing issues with sleep (almost 4 hours worth).  Currently taking 10 mg zolpidem   Outpatient Medications Prior to Visit  Medication Sig Dispense Refill   acetaminophen  (TYLENOL ) 500 MG tablet Take 1,000 mg by mouth every 6 (six) hours as needed.     amLODipine  (NORVASC ) 5 MG tablet TAKE 1 TABLET BY MOUTH DAILY 90 tablet 3   amoxicillin  (AMOXIL ) 500 MG capsule Take 4 capsules 1 hour prior to a dental cleaning, dental work 20 capsule 1   Ascorbic Acid  (VITAMIN C  PO) Take by mouth daily.     b complex vitamins tablet Take 1 tablet by mouth daily. 100 tablet 3   Cholecalciferol (VITAMIN D3) 50 MCG (2000 UT) capsule Take 2 capsules (4,000 Units total) by mouth daily. 100 capsule 3   diazepam  (VALIUM ) 5 MG tablet TAKE ONE TABLET BY MOUTH EVERY 12 HOURS AS NEEDED FOR MUSCLE SPASMS OR ANXIETY 30 tablet 3   fexofenadine (ALLEGRA) 180 MG tablet Take 180 mg by mouth daily.     FLUoxetine  (PROZAC ) 10 MG tablet Take 1 tablet (10 mg total) by mouth daily. 30 tablet 5   ibuprofen  (ADVIL ,MOTRIN ) 400 MG tablet Take 400 mg by mouth every 6 (six) hours as needed.     latanoprost (XALATAN) 0.005 % ophthalmic solution Apply to eye.     loratadine  (CLARITIN ) 10 MG tablet Take 1 tablet (10 mg total) by mouth daily. 30 tablet 11   losartan  (COZAAR ) 100 MG tablet TAKE 1 TABLET BY MOUTH DAILY 90 tablet 3   zolpidem  (AMBIEN ) 10 MG tablet TAKE 0.5 TO 1 TABLET BY MOUTH EVERY NIGHT AT BEDTIME AS NEEDED FOR SLEEP 30 tablet 1   No facility-administered medications prior to visit.    ROS: Review of Systems  Constitutional:  Negative for activity change, appetite change, chills, fatigue and unexpected weight  change.  HENT:  Negative for congestion, mouth sores and sinus pressure.   Eyes:  Negative for visual disturbance.  Respiratory:  Negative for cough and chest tightness.   Gastrointestinal:  Negative for abdominal pain and nausea.  Genitourinary:  Negative for difficulty urinating, frequency and vaginal pain.  Musculoskeletal:  Positive for arthralgias and back pain. Negative for gait problem.  Skin:  Negative for pallor and rash.  Neurological:  Negative for dizziness, tremors, weakness, numbness and headaches.  Psychiatric/Behavioral:  Positive for dysphoric mood and sleep disturbance. Negative for confusion, decreased concentration and suicidal ideas. The patient is nervous/anxious.     Objective:  BP 124/68   Pulse 81   Temp 98 F (36.7 C)   Ht 5\' 4"  (1.626 m)   Wt 177 lb (80.3 kg)   SpO2 96%   BMI 30.38 kg/m   BP Readings from Last 3 Encounters:  11/22/23 124/68  08/24/23 110/60  05/25/23 122/78    Wt Readings from Last 3 Encounters:  11/22/23 177 lb (80.3 kg)  08/24/23 180 lb (81.6 kg)  05/25/23 186 lb (84.4 kg)    Physical Exam Constitutional:      General: She is not in acute distress.    Appearance: She is well-developed. She is obese.  HENT:     Head: Normocephalic.     Right  Ear: External ear normal.     Left Ear: External ear normal.     Nose: Nose normal.  Eyes:     General:        Right eye: No discharge.        Left eye: No discharge.     Conjunctiva/sclera: Conjunctivae normal.     Pupils: Pupils are equal, round, and reactive to light.  Neck:     Thyroid : No thyromegaly.     Vascular: No JVD.     Trachea: No tracheal deviation.  Cardiovascular:     Rate and Rhythm: Normal rate and regular rhythm.     Heart sounds: Normal heart sounds.  Pulmonary:     Effort: No respiratory distress.     Breath sounds: No stridor. No wheezing.  Abdominal:     General: Bowel sounds are normal. There is no distension.     Palpations: Abdomen is soft. There  is no mass.     Tenderness: There is no abdominal tenderness. There is no guarding or rebound.  Musculoskeletal:        General: No tenderness.     Cervical back: Normal range of motion and neck supple. No rigidity.  Lymphadenopathy:     Cervical: No cervical adenopathy.  Skin:    Findings: No erythema or rash.  Neurological:     Mental Status: Mental status is at baseline.     Cranial Nerves: No cranial nerve deficit.     Motor: No abnormal muscle tone.     Coordination: Coordination normal.     Deep Tendon Reflexes: Reflexes normal.  Psychiatric:        Behavior: Behavior normal.        Thought Content: Thought content normal.        Judgment: Judgment normal.     Lab Results  Component Value Date   WBC 8.9 05/09/2022   HGB 13.9 05/09/2022   HCT 40.5 05/09/2022   PLT 318.0 05/09/2022   GLUCOSE 98 10/13/2022   CHOL 240 (H) 05/09/2022   TRIG 168.0 (H) 05/09/2022   HDL 57.10 05/09/2022   LDLDIRECT 161.3 06/17/2011   LDLCALC 150 (H) 05/09/2022   ALT 19 10/13/2022   AST 21 10/13/2022   NA 138 10/13/2022   K 4.2 10/13/2022   CL 102 10/13/2022   CREATININE 0.87 10/13/2022   BUN 13 10/13/2022   CO2 27 10/13/2022   TSH 2.83 10/13/2022   INR 1.06 06/12/2018    MM 3D SCREENING MAMMOGRAM BILATERAL BREAST Result Date: 03/24/2023 CLINICAL DATA:  Screening. EXAM: DIGITAL SCREENING BILATERAL MAMMOGRAM WITH TOMOSYNTHESIS AND CAD TECHNIQUE: Bilateral screening digital craniocaudal and mediolateral oblique mammograms were obtained. Bilateral screening digital breast tomosynthesis was performed. The images were evaluated with computer-aided detection. COMPARISON:  Previous exam(s). ACR Breast Density Category b: There are scattered areas of fibroglandular density. FINDINGS: There are no findings suspicious for malignancy. IMPRESSION: No mammographic evidence of malignancy. A result letter of this screening mammogram will be mailed directly to the patient. RECOMMENDATION: Screening  mammogram in one year. (Code:SM-B-01Y) BI-RADS CATEGORY  1: Negative. Electronically Signed   By: Dobrinka  Dimitrova M.D.   On: 03/24/2023 13:15    Assessment & Plan:   Problem List Items Addressed This Visit     Obesity   Wt Readings from Last 3 Encounters:  11/22/23 177 lb (80.3 kg)  08/24/23 180 lb (81.6 kg)  05/25/23 186 lb (84.4 kg)         Anxiety disorder  Chronic, situational.  Continue with  Wellbutrin .  Diazepam  as needed-rare use.  Potential benefits of a long term benzodiazepines  use as well as potential risks  and complications were explained to the patient and were aknowledged. Pt declined SSRI, etc      Chronic low back pain - Primary   Norco prn - pt stopped  Potential benefits of a long term opioids use as well as potential risks (i.e. addiction risk, apnea etc) and complications (i.e. Somnolence, constipation and others) were explained to the patient and were aknowledged. Diazepm prn spasms  Potential benefits of a long term benzodiazepines  use as well as potential risks  and complications were explained to the patient and were aknowledged. Tylenol  prn      Situational mixed anxiety and depressive disorder   Diazepam  prn  Potential benefits of a long term benzodiazepines  use as well as potential risks  and complications were explained to the patient and were aknowledged.       B12 deficiency   On B12         Meds ordered this encounter  Medications   temazepam  (RESTORIL ) 30 MG capsule    Sig: Take 1 capsule (30 mg total) by mouth at bedtime as needed for sleep.    Dispense:  30 capsule    Refill:  5      Follow-up: Return in about 3 months (around 02/21/2024) for a follow-up visit.  Anitra Barn, MD

## 2023-11-22 NOTE — Assessment & Plan Note (Signed)
 Norco prn - pt stopped  Potential benefits of a long term opioids use as well as potential risks (i.e. addiction risk, apnea etc) and complications (i.e. Somnolence, constipation and others) were explained to the patient and were aknowledged. Diazepm prn spasms  Potential benefits of a long term benzodiazepines  use as well as potential risks  and complications were explained to the patient and were aknowledged. Tylenol  prn

## 2023-11-23 LAB — URINALYSIS, ROUTINE W REFLEX MICROSCOPIC
Bilirubin Urine: NEGATIVE
Hgb urine dipstick: NEGATIVE
Ketones, ur: NEGATIVE
Nitrite: NEGATIVE
Specific Gravity, Urine: 1.02 (ref 1.000–1.030)
Total Protein, Urine: NEGATIVE
Urine Glucose: NEGATIVE
Urobilinogen, UA: 0.2 (ref 0.0–1.0)
pH: 6 (ref 5.0–8.0)

## 2023-11-26 ENCOUNTER — Other Ambulatory Visit: Payer: Self-pay | Admitting: Internal Medicine

## 2023-11-26 ENCOUNTER — Encounter: Payer: Self-pay | Admitting: Internal Medicine

## 2023-11-26 MED ORDER — CIPROFLOXACIN HCL 250 MG PO TABS: 250.0000 mg | ORAL_TABLET | Freq: Two times a day (BID) | ORAL | 1 refills | Status: AC

## 2023-12-04 ENCOUNTER — Other Ambulatory Visit: Payer: Self-pay | Admitting: Internal Medicine

## 2023-12-04 DIAGNOSIS — F419 Anxiety disorder, unspecified: Secondary | ICD-10-CM

## 2023-12-04 NOTE — Telephone Encounter (Signed)
 Pt has asked " can I get the Zolpidem  refilled?  The temazepam  30 MG capsule makes me feel lightheaded and dizzy  ...Aaron AasAaron AasAaron Aas   the Zolpidem  is good."

## 2024-01-09 ENCOUNTER — Telehealth: Payer: Self-pay | Admitting: Internal Medicine

## 2024-01-09 NOTE — Telephone Encounter (Signed)
 Copied from CRM [id number]. Topic: Clinical - Prescription Issue >> Jan 09, 2024  1:15 PM Chasity T wrote: Reason for CRM: Patient is calling in because she was told that the medication diazepam  (VALIUM ) 5 MG tablet was discontinued from her medication list and she can not get anymore. She is requesting that her PCP put her back on that medication because she did a lot better with it than what she is currently on which is the temazepam  (RESTORIL ) 30 MG capsule. She states that the temazepam  (RESTORIL ) 30 MG capsule makes her dizzy and not feel well so she's not able to take it. Requesting for a call back at the number on file please or call the pharmacy to renew it.  ---  Appears we filled diazepam  on 5.5.25 w/ 3 refills. Please advise pt. TDW

## 2024-01-10 NOTE — Telephone Encounter (Signed)
 Copied from CRM #[id number]. Topic: Clinical - Medication Question >> Jan 10, 2024  3:40 PM [name] wrote: Reason for CRM: Patient is calling in because she was told that the medication zolpidem  10 MG tablet was discontinued from her medication list and she can not get anymore. She is requesting that her PCP put her back on that medication because she did a lot better with it than what she is currently on which is the temazepam  (RESTORIL ) 30 MG capsule. She states that the temazepam  (RESTORIL ) 30 MG capsule makes her dizzy and not feel well so she's not able to take it. Requesting for a call back at the number on file please or call the pharmacy to renew it.

## 2024-01-11 MED ORDER — ZOLPIDEM TARTRATE 10 MG PO TABS: 5.0000 mg | ORAL_TABLET | Freq: Every evening | ORAL | 1 refills | Status: DC | PRN

## 2024-01-11 NOTE — Telephone Encounter (Signed)
 Okay.  Thanks.

## 2024-02-07 ENCOUNTER — Other Ambulatory Visit: Payer: Self-pay | Admitting: Hematology and Oncology

## 2024-02-07 DIAGNOSIS — Z1231 Encounter for screening mammogram for malignant neoplasm of breast: Secondary | ICD-10-CM

## 2024-02-21 ENCOUNTER — Ambulatory Visit: Admitting: Internal Medicine

## 2024-02-21 ENCOUNTER — Encounter: Payer: Self-pay | Admitting: Internal Medicine

## 2024-02-21 VITALS — BP 136/80 | HR 70 | Temp 97.7°F | Ht 64.0 in | Wt 177.0 lb

## 2024-02-21 DIAGNOSIS — M1990 Unspecified osteoarthritis, unspecified site: Secondary | ICD-10-CM

## 2024-02-21 DIAGNOSIS — M5442 Lumbago with sciatica, left side: Secondary | ICD-10-CM | POA: Diagnosis not present

## 2024-02-21 DIAGNOSIS — I10 Essential (primary) hypertension: Secondary | ICD-10-CM | POA: Diagnosis not present

## 2024-02-21 DIAGNOSIS — M5441 Lumbago with sciatica, right side: Secondary | ICD-10-CM

## 2024-02-21 DIAGNOSIS — E538 Deficiency of other specified B group vitamins: Secondary | ICD-10-CM

## 2024-02-21 DIAGNOSIS — R2681 Unsteadiness on feet: Secondary | ICD-10-CM | POA: Diagnosis not present

## 2024-02-21 DIAGNOSIS — G8929 Other chronic pain: Secondary | ICD-10-CM

## 2024-02-21 DIAGNOSIS — M170 Bilateral primary osteoarthritis of knee: Secondary | ICD-10-CM

## 2024-02-21 NOTE — Assessment & Plan Note (Signed)
 Start PT at at [facility]

## 2024-02-21 NOTE — Assessment & Plan Note (Signed)
On B complex 

## 2024-02-21 NOTE — Assessment & Plan Note (Signed)
Cont on losartan and amlodipine

## 2024-02-21 NOTE — Progress Notes (Signed)
 Subjective:  Patient ID: Candis Avelina Crimes, female    DOB: June 30, 1941  Age: 83 y.o. MRN: [id number]  CC: Medical Management of Chronic Issues (3 MNTH F/U )   HPI Jersi Mcmaster presents for HTN, LBP, insomnia C/o poor memory, poor balance  Outpatient Medications Prior to Visit  Medication Sig Dispense Refill   acetaminophen  (TYLENOL ) 500 MG tablet Take 1,000 mg by mouth every 6 (six) hours as needed.     amLODipine  (NORVASC ) 5 MG tablet TAKE 1 TABLET BY MOUTH DAILY 90 tablet 3   amoxicillin  (AMOXIL ) 500 MG capsule Take 4 capsules 1 hour prior to a dental cleaning, dental work 20 capsule 1   Ascorbic Acid  (VITAMIN C  PO) Take by mouth daily.     b complex vitamins tablet Take 1 tablet by mouth daily. 100 tablet 3   Cholecalciferol (VITAMIN D3) 50 MCG (2000 UT) capsule Take 2 capsules (4,000 Units total) by mouth daily. 100 capsule 3   diazepam  (VALIUM ) 5 MG tablet TAKE 1 TABLET BY MOUTH EVERY 12 HOURS AS NEEDED FOR MUSCLE SPASMS OR ANXIETY. 30 tablet 3   fexofenadine (ALLEGRA) 180 MG tablet Take 180 mg by mouth daily.     ibuprofen  (ADVIL ,MOTRIN ) 400 MG tablet Take 400 mg by mouth every 6 (six) hours as needed.     latanoprost (XALATAN) 0.005 % ophthalmic solution Apply to eye.     loratadine  (CLARITIN ) 10 MG tablet Take 1 tablet (10 mg total) by mouth daily. 30 tablet 11   losartan  (COZAAR ) 100 MG tablet TAKE 1 TABLET BY MOUTH DAILY 90 tablet 3   zolpidem  (AMBIEN ) 10 MG tablet Take 0.5-1 tablets (5-10 mg total) by mouth at bedtime as needed for sleep. 30 tablet 1   FLUoxetine  (PROZAC ) 10 MG tablet Take 1 tablet (10 mg total) by mouth daily. 30 tablet 5   No facility-administered medications prior to visit.    ROS: Review of Systems  Constitutional:  Negative for activity change, appetite change, chills, fatigue and unexpected weight change.  HENT:  Negative for congestion, mouth sores and sinus pressure.   Eyes:  Negative for visual disturbance.  Respiratory:   Negative for cough and chest tightness.   Gastrointestinal:  Negative for abdominal pain and nausea.  Genitourinary:  Negative for difficulty urinating, frequency and vaginal pain.  Musculoskeletal:  Positive for arthralgias, back pain and gait problem.  Skin:  Negative for pallor and rash.  Neurological:  Negative for dizziness, tremors, weakness, numbness and headaches.  Psychiatric/Behavioral:  Positive for decreased concentration, dysphoric mood and sleep disturbance. Negative for confusion and suicidal ideas. The patient is nervous/anxious.     Objective:  BP 136/80   Pulse 70   Temp 97.7 F (36.5 C) (Oral)   Ht 5' 4 (1.626 m)   Wt 177 lb (80.3 kg)   SpO2 96%   BMI 30.38 kg/m   BP Readings from Last 3 Encounters:  02/21/24 136/80  11/22/23 124/68  08/24/23 110/60    Wt Readings from Last 3 Encounters:  02/21/24 177 lb (80.3 kg)  11/22/23 177 lb (80.3 kg)  08/24/23 180 lb (81.6 kg)    Physical Exam Constitutional:      General: She is not in acute distress.    Appearance: She is well-developed. She is obese.  HENT:     Head: Normocephalic.     Right Ear: External ear normal.     Left Ear: External ear normal.     Nose: Nose normal.  Eyes:  General:        Right eye: No discharge.        Left eye: No discharge.     Conjunctiva/sclera: Conjunctivae normal.     Pupils: Pupils are equal, round, and reactive to light.  Neck:     Thyroid : No thyromegaly.     Vascular: No JVD.     Trachea: No tracheal deviation.  Cardiovascular:     Rate and Rhythm: Normal rate and regular rhythm.     Heart sounds: Normal heart sounds.  Pulmonary:     Effort: No respiratory distress.     Breath sounds: No stridor. No wheezing.  Abdominal:     General: Bowel sounds are normal. There is no distension.     Palpations: Abdomen is soft. There is no mass.     Tenderness: There is no abdominal tenderness. There is no guarding or rebound.  Musculoskeletal:        General:  Tenderness present.     Cervical back: Normal range of motion and neck supple. No rigidity.     Right lower leg: No edema.     Left lower leg: No edema.  Lymphadenopathy:     Cervical: No cervical adenopathy.  Skin:    Findings: No erythema or rash.  Neurological:     Mental Status: She is oriented to person, place, and time.     Cranial Nerves: No cranial nerve deficit.     Motor: No abnormal muscle tone.     Coordination: Coordination abnormal.     Gait: Gait abnormal.     Deep Tendon Reflexes: Reflexes normal.  Psychiatric:        Behavior: Behavior normal.        Thought Content: Thought content normal.        Judgment: Judgment normal.   Arthritic gait, mild ataxia Romberg (+/-)   Lab Results  Component Value Date   WBC 9.3 11/22/2023   HGB 14.0 11/22/2023   HCT 41.6 11/22/2023   PLT 318.0 11/22/2023   GLUCOSE 93 11/22/2023   CHOL 231 (H) 11/22/2023   TRIG 173.0 (H) 11/22/2023   HDL 53.80 11/22/2023   LDLDIRECT 161.3 06/17/2011   LDLCALC 143 (H) 11/22/2023   ALT 13 11/22/2023   AST 16 11/22/2023   NA 138 11/22/2023   K 4.0 11/22/2023   CL 102 11/22/2023   CREATININE 0.79 11/22/2023   BUN 9 11/22/2023   CO2 29 11/22/2023   TSH 2.59 11/22/2023   INR 1.06 06/12/2018    MM 3D SCREENING MAMMOGRAM BILATERAL BREAST Result Date: 03/24/2023 CLINICAL DATA:  Screening. EXAM: DIGITAL SCREENING BILATERAL MAMMOGRAM WITH TOMOSYNTHESIS AND CAD TECHNIQUE: Bilateral screening digital craniocaudal and mediolateral oblique mammograms were obtained. Bilateral screening digital breast tomosynthesis was performed. The images were evaluated with computer-aided detection. COMPARISON:  Previous exam(s). ACR Breast Density Category b: There are scattered areas of fibroglandular density. FINDINGS: There are no findings suspicious for malignancy. IMPRESSION: No mammographic evidence of malignancy. A result letter of this screening mammogram will be mailed directly to the patient.  RECOMMENDATION: Screening mammogram in one year. (Code:SM-B-01Y) BI-RADS CATEGORY  1: Negative. Electronically Signed   By: Dobrinka  Dimitrova M.D.   On: 03/24/2023 13:15    Assessment & Plan:   Problem List Items Addressed This Visit     B12 deficiency   On B complex       Chronic low back pain   Start PT at at [facility]      Relevant Orders  Ambulatory referral to Physical Therapy   HTN (hypertension)   Cont on losartan  and amlodipine       Relevant Orders   Comprehensive metabolic panel with GFR   Osteoarthritis   Relevant Orders   Ambulatory referral to Physical Therapy   Ambulatory referral to Physical Therapy   Osteoarthritis of both knees   Start PT      Relevant Orders   Ambulatory referral to Physical Therapy   Other Visit Diagnoses       Unsteady gait    -  Primary   Relevant Orders   Ambulatory referral to Physical Therapy         No orders of the defined types were placed in this encounter.     Follow-up: Return in about 3 months (around 05/23/2024) for a follow-up visit.  Marolyn Noel, MD

## 2024-02-21 NOTE — Assessment & Plan Note (Signed)
 Start PT

## 2024-03-07 ENCOUNTER — Other Ambulatory Visit: Payer: Self-pay | Admitting: Internal Medicine

## 2024-03-25 ENCOUNTER — Ambulatory Visit
Admission: RE | Admit: 2024-03-25 | Discharge: 2024-03-25 | Disposition: A | Discharge status: Home / Self Care | Source: Ambulatory Visit | Attending: Hematology and Oncology | Admitting: Hematology and Oncology

## 2024-03-25 DIAGNOSIS — Z1231 Encounter for screening mammogram for malignant neoplasm of breast: Secondary | ICD-10-CM

## 2024-03-28 ENCOUNTER — Inpatient Hospital Stay: Payer: Medicare Other | Attending: Hematology and Oncology | Admitting: Hematology and Oncology

## 2024-03-28 VITALS — BP 157/65 | HR 96 | Temp 98.4°F | Resp 16 | Wt 178.5 lb

## 2024-03-28 DIAGNOSIS — M8580 Other specified disorders of bone density and structure, unspecified site: Secondary | ICD-10-CM | POA: Diagnosis not present

## 2024-03-28 DIAGNOSIS — Z1721 Progesterone receptor positive status: Secondary | ICD-10-CM | POA: Insufficient documentation

## 2024-03-28 DIAGNOSIS — Z79811 Long term (current) use of aromatase inhibitors: Secondary | ICD-10-CM | POA: Diagnosis not present

## 2024-03-28 DIAGNOSIS — Z87891 Personal history of nicotine dependence: Secondary | ICD-10-CM | POA: Insufficient documentation

## 2024-03-28 DIAGNOSIS — N644 Mastodynia: Secondary | ICD-10-CM | POA: Insufficient documentation

## 2024-03-28 DIAGNOSIS — Z1731 Human epidermal growth factor receptor 2 positive status: Secondary | ICD-10-CM | POA: Diagnosis not present

## 2024-03-28 DIAGNOSIS — C50412 Malignant neoplasm of upper-outer quadrant of left female breast: Secondary | ICD-10-CM | POA: Insufficient documentation

## 2024-03-28 DIAGNOSIS — Z90710 Acquired absence of both cervix and uterus: Secondary | ICD-10-CM | POA: Insufficient documentation

## 2024-03-28 DIAGNOSIS — Z800 Family history of malignant neoplasm of digestive organs: Secondary | ICD-10-CM | POA: Diagnosis not present

## 2024-03-28 DIAGNOSIS — Z170 Estrogen receptor positive status [ER+]: Secondary | ICD-10-CM | POA: Diagnosis not present

## 2024-03-28 DIAGNOSIS — R2681 Unsteadiness on feet: Secondary | ICD-10-CM | POA: Insufficient documentation
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
## 2024-04-09 ENCOUNTER — Ambulatory Visit (HOSPITAL_BASED_OUTPATIENT_CLINIC_OR_DEPARTMENT_OTHER): Attending: Internal Medicine | Admitting: Physical Therapy

## 2024-04-09 DIAGNOSIS — M5442 Lumbago with sciatica, left side: Secondary | ICD-10-CM | POA: Diagnosis not present

## 2024-04-09 DIAGNOSIS — R2681 Unsteadiness on feet: Secondary | ICD-10-CM | POA: Diagnosis not present

## 2024-04-09 DIAGNOSIS — Z9181 History of falling: Secondary | ICD-10-CM | POA: Insufficient documentation

## 2024-04-09 DIAGNOSIS — M25562 Pain in left knee: Secondary | ICD-10-CM | POA: Diagnosis present

## 2024-04-09 DIAGNOSIS — M170 Bilateral primary osteoarthritis of knee: Secondary | ICD-10-CM | POA: Insufficient documentation

## 2024-04-09 DIAGNOSIS — M5459 Other low back pain: Secondary | ICD-10-CM | POA: Insufficient documentation

## 2024-04-09 DIAGNOSIS — M5441 Lumbago with sciatica, right side: Secondary | ICD-10-CM | POA: Diagnosis not present

## 2024-04-09 DIAGNOSIS — G8929 Other chronic pain: Secondary | ICD-10-CM | POA: Insufficient documentation

## 2024-04-09 DIAGNOSIS — M1990 Unspecified osteoarthritis, unspecified site: Secondary | ICD-10-CM | POA: Insufficient documentation

## 2024-04-09 DIAGNOSIS — M25561 Pain in right knee: Secondary | ICD-10-CM | POA: Diagnosis present

## 2024-04-09 DIAGNOSIS — R2689 Other abnormalities of gait and mobility: Secondary | ICD-10-CM | POA: Insufficient documentation
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
## 2024-04-10 ENCOUNTER — Encounter (HOSPITAL_BASED_OUTPATIENT_CLINIC_OR_DEPARTMENT_OTHER): Payer: Self-pay | Admitting: Physical Therapy

## 2024-04-10 ENCOUNTER — Other Ambulatory Visit: Payer: Self-pay

## 2024-04-23 ENCOUNTER — Encounter (HOSPITAL_BASED_OUTPATIENT_CLINIC_OR_DEPARTMENT_OTHER): Payer: Self-pay | Admitting: Physical Therapy

## 2024-04-23 ENCOUNTER — Ambulatory Visit (HOSPITAL_BASED_OUTPATIENT_CLINIC_OR_DEPARTMENT_OTHER): Admitting: Physical Therapy

## 2024-04-23 DIAGNOSIS — Z9181 History of falling: Secondary | ICD-10-CM

## 2024-04-23 DIAGNOSIS — M25561 Pain in right knee: Secondary | ICD-10-CM | POA: Diagnosis not present

## 2024-04-23 DIAGNOSIS — R2689 Other abnormalities of gait and mobility: Secondary | ICD-10-CM

## 2024-04-23 DIAGNOSIS — M5459 Other low back pain: Secondary | ICD-10-CM

## 2024-04-23 DIAGNOSIS — M25562 Pain in left knee: Secondary | ICD-10-CM
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
## 2024-05-09 ENCOUNTER — Encounter (HOSPITAL_BASED_OUTPATIENT_CLINIC_OR_DEPARTMENT_OTHER): Payer: Self-pay | Admitting: Physical Therapy

## 2024-05-09 ENCOUNTER — Other Ambulatory Visit: Payer: Self-pay | Admitting: Internal Medicine

## 2024-05-09 ENCOUNTER — Ambulatory Visit (HOSPITAL_BASED_OUTPATIENT_CLINIC_OR_DEPARTMENT_OTHER): Attending: Internal Medicine | Admitting: Physical Therapy

## 2024-05-09 DIAGNOSIS — M25561 Pain in right knee: Secondary | ICD-10-CM | POA: Insufficient documentation

## 2024-05-09 DIAGNOSIS — M5459 Other low back pain: Secondary | ICD-10-CM | POA: Insufficient documentation

## 2024-05-09 DIAGNOSIS — M25562 Pain in left knee: Secondary | ICD-10-CM | POA: Diagnosis present

## 2024-05-09 DIAGNOSIS — I10 Essential (primary) hypertension: Secondary | ICD-10-CM

## 2024-05-09 DIAGNOSIS — R2689 Other abnormalities of gait and mobility: Secondary | ICD-10-CM | POA: Insufficient documentation

## 2024-05-09 DIAGNOSIS — Z9181 History of falling: Secondary | ICD-10-CM | POA: Diagnosis present
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
## 2024-05-10 ENCOUNTER — Other Ambulatory Visit: Payer: Self-pay | Admitting: Internal Medicine

## 2024-05-10 DIAGNOSIS — I10 Essential (primary) hypertension: Secondary | ICD-10-CM

## 2024-05-14 ENCOUNTER — Encounter (HOSPITAL_BASED_OUTPATIENT_CLINIC_OR_DEPARTMENT_OTHER): Payer: Self-pay | Admitting: Physical Therapy

## 2024-05-14 ENCOUNTER — Ambulatory Visit (HOSPITAL_BASED_OUTPATIENT_CLINIC_OR_DEPARTMENT_OTHER): Admitting: Physical Therapy

## 2024-05-14 DIAGNOSIS — Z9181 History of falling: Secondary | ICD-10-CM

## 2024-05-14 DIAGNOSIS — M5459 Other low back pain: Secondary | ICD-10-CM

## 2024-05-14 DIAGNOSIS — R2689 Other abnormalities of gait and mobility: Secondary | ICD-10-CM

## 2024-05-14 DIAGNOSIS — M25561 Pain in right knee: Secondary | ICD-10-CM
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
## 2024-05-16 ENCOUNTER — Ambulatory Visit (HOSPITAL_BASED_OUTPATIENT_CLINIC_OR_DEPARTMENT_OTHER): Admitting: Physical Therapy

## 2024-05-16 DIAGNOSIS — M25561 Pain in right knee: Secondary | ICD-10-CM | POA: Diagnosis not present

## 2024-05-16 DIAGNOSIS — Z9181 History of falling: Secondary | ICD-10-CM

## 2024-05-16 DIAGNOSIS — R2689 Other abnormalities of gait and mobility: Secondary | ICD-10-CM

## 2024-05-16 DIAGNOSIS — M5459 Other low back pain: Secondary | ICD-10-CM
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
## 2024-05-17 ENCOUNTER — Encounter (HOSPITAL_BASED_OUTPATIENT_CLINIC_OR_DEPARTMENT_OTHER): Payer: Self-pay | Admitting: Physical Therapy

## 2024-05-21 ENCOUNTER — Encounter (HOSPITAL_BASED_OUTPATIENT_CLINIC_OR_DEPARTMENT_OTHER): Payer: Self-pay | Admitting: Physical Therapy

## 2024-05-23 ENCOUNTER — Encounter: Payer: Self-pay | Admitting: Internal Medicine

## 2024-05-23 ENCOUNTER — Ambulatory Visit: Admitting: Internal Medicine

## 2024-05-23 VITALS — BP 132/76 | HR 86 | Ht 64.0 in | Wt 179.0 lb

## 2024-05-23 DIAGNOSIS — E538 Deficiency of other specified B group vitamins: Secondary | ICD-10-CM | POA: Diagnosis not present

## 2024-05-23 DIAGNOSIS — I10 Essential (primary) hypertension: Secondary | ICD-10-CM

## 2024-05-23 DIAGNOSIS — R413 Other amnesia: Secondary | ICD-10-CM | POA: Diagnosis not present

## 2024-05-23 DIAGNOSIS — F413 Other mixed anxiety disorders: Secondary | ICD-10-CM | POA: Diagnosis not present

## 2024-05-23 DIAGNOSIS — Z23 Encounter for immunization: Secondary | ICD-10-CM | POA: Diagnosis not present

## 2024-05-23 DIAGNOSIS — F4323 Adjustment disorder with mixed anxiety and depressed mood: Secondary | ICD-10-CM

## 2024-05-23 MED ORDER — ESCITALOPRAM OXALATE 10 MG PO TABS: 10.0000 mg | ORAL_TABLET | Freq: Every day | ORAL | 5 refills | Status: AC

## 2024-05-23 NOTE — Assessment & Plan Note (Signed)
 Use electric toothbrush

## 2024-05-23 NOTE — Assessment & Plan Note (Signed)
Cont on losartan and amlodipine

## 2024-05-23 NOTE — Assessment & Plan Note (Signed)
 On B12

## 2024-05-23 NOTE — Addendum Note (Signed)
 Addended byBETHA LUCETTA CLEATRICE LELON on: 05/23/2024 04:28 PM   Modules accepted: Orders

## 2024-05-23 NOTE — Progress Notes (Signed)
 Subjective:  Patient ID: [name], female    DOB: 09-29-1940  Age: 83 y.o. MRN: [id number]  CC: Medical Management of Chronic Issues (3 Month follow up. General body aches. Notes of some new short term memory issues)   HPI [name] presents for HTN, LBP, OA Her close friend died 3 weeks ago  Outpatient Medications Prior to Visit  Medication Sig Dispense Refill   acetaminophen  (TYLENOL ) 500 MG tablet Take 1,000 mg by mouth every 6 (six) hours as needed.     amLODipine  (NORVASC ) 5 MG tablet TAKE 1 TABLET BY MOUTH DAILY 90 tablet 3   amoxicillin  (AMOXIL ) 500 MG capsule Take 4 capsules 1 hour prior to a dental cleaning, dental work 20 capsule 1   Ascorbic Acid  (VITAMIN C  PO) Take by mouth daily.     b complex vitamins tablet Take 1 tablet by mouth daily. 100 tablet 3   Cholecalciferol (VITAMIN D3) 50 MCG (2000 UT) capsule Take 2 capsules (4,000 Units total) by mouth daily. 100 capsule 3   diazepam  (VALIUM ) 5 MG tablet TAKE 1 TABLET BY MOUTH EVERY 12 HOURS AS NEEDED FOR MUSCLE SPASMS OR ANXIETY. 30 tablet 3   fexofenadine (ALLEGRA) 180 MG tablet Take 180 mg by mouth daily.     ibuprofen  (ADVIL ,MOTRIN ) 400 MG tablet Take 400 mg by mouth every 6 (six) hours as needed.     latanoprost (XALATAN) 0.005 % ophthalmic solution Apply to eye.     loratadine  (CLARITIN ) 10 MG tablet Take 1 tablet (10 mg total) by mouth daily. 30 tablet 11   losartan  (COZAAR ) 100 MG tablet TAKE 1 TABLET BY MOUTH DAILY 90 tablet 3   zolpidem  (AMBIEN ) 10 MG tablet TAKE 0.5 TO 1 TABLET BY MOUTH AT BEDTIME AS NEEDED FOR SLEEP 30 tablet 5   No facility-administered medications prior to visit.    ROS: Review of Systems  Constitutional:  Positive for fatigue. Negative for activity change, appetite change, chills and unexpected weight change.  HENT:  Negative for congestion, mouth sores and sinus pressure.   Eyes:  Negative for visual disturbance.  Respiratory:  Negative for cough and chest  tightness.   Gastrointestinal:  Negative for abdominal pain and nausea.  Genitourinary:  Negative for difficulty urinating, frequency and vaginal pain.  Musculoskeletal:  Positive for arthralgias and back pain. Negative for gait problem.  Skin:  Negative for pallor and rash.  Neurological:  Negative for dizziness, tremors, weakness, numbness and headaches.  Psychiatric/Behavioral:  Positive for decreased concentration and sleep disturbance. Negative for confusion and suicidal ideas. The patient is nervous/anxious.     Objective:  BP 132/76   Pulse 86   Ht 5' 4 (1.626 m)   Wt 179 lb (81.2 kg)   SpO2 97%   BMI 30.73 kg/m   BP Readings from Last 3 Encounters:  05/23/24 132/76  03/28/24 (!) 157/65  02/21/24 136/80    Wt Readings from Last 3 Encounters:  05/23/24 179 lb (81.2 kg)  03/28/24 178 lb 8 oz (81 kg)  02/21/24 177 lb (80.3 kg)    Physical Exam Constitutional:      General: She is not in acute distress.    Appearance: She is well-developed. She is obese.  HENT:     Head: Normocephalic.     Right Ear: External ear normal.     Left Ear: External ear normal.     Nose: Nose normal.  Eyes:     General:  Right eye: No discharge.        Left eye: No discharge.     Conjunctiva/sclera: Conjunctivae normal.     Pupils: Pupils are equal, round, and reactive to light.  Neck:     Thyroid : No thyromegaly.     Vascular: No JVD.     Trachea: No tracheal deviation.  Cardiovascular:     Rate and Rhythm: Normal rate and regular rhythm.     Heart sounds: Normal heart sounds.  Pulmonary:     Effort: No respiratory distress.     Breath sounds: No stridor. No wheezing.  Abdominal:     General: Bowel sounds are normal. There is no distension.     Palpations: Abdomen is soft. There is no mass.     Tenderness: There is no abdominal tenderness. There is no guarding or rebound.  Musculoskeletal:        General: Tenderness present.     Cervical back: Normal range of motion  and neck supple. No rigidity.     Right lower leg: No edema.     Left lower leg: No edema.  Lymphadenopathy:     Cervical: No cervical adenopathy.  Skin:    Findings: No erythema or rash.  Neurological:     Mental Status: She is oriented to person, place, and time.     Cranial Nerves: No cranial nerve deficit.     Motor: No abnormal muscle tone.     Coordination: Coordination normal.     Deep Tendon Reflexes: Reflexes normal.  Psychiatric:        Behavior: Behavior normal.        Thought Content: Thought content normal.        Judgment: Judgment normal.    LS w/pain   Lab Results  Component Value Date   WBC 9.3 11/22/2023   HGB 14.0 11/22/2023   HCT 41.6 11/22/2023   PLT 318.0 11/22/2023   GLUCOSE 93 11/22/2023   CHOL 231 (H) 11/22/2023   TRIG 173.0 (H) 11/22/2023   HDL 53.80 11/22/2023   LDLDIRECT 161.3 06/17/2011   LDLCALC 143 (H) 11/22/2023   ALT 13 11/22/2023   AST 16 11/22/2023   NA 138 11/22/2023   K 4.0 11/22/2023   CL 102 11/22/2023   CREATININE 0.79 11/22/2023   BUN 9 11/22/2023   CO2 29 11/22/2023   TSH 2.59 11/22/2023   INR 1.06 06/12/2018    MM 3D SCREENING MAMMOGRAM BILATERAL BREAST Result Date: 03/28/2024 CLINICAL DATA:  Screening. EXAM: DIGITAL SCREENING BILATERAL MAMMOGRAM WITH TOMOSYNTHESIS AND CAD TECHNIQUE: Bilateral screening digital craniocaudal and mediolateral oblique mammograms were obtained. Bilateral screening digital breast tomosynthesis was performed. The images were evaluated with computer-aided detection. COMPARISON:  Previous exam(s). ACR Breast Density Category b: There are scattered areas of fibroglandular density. FINDINGS: There are no findings suspicious for malignancy. IMPRESSION: No mammographic evidence of malignancy. A result letter of this screening mammogram will be mailed directly to the patient. RECOMMENDATION: Screening mammogram in one year. (Code:SM-B-01Y) BI-RADS CATEGORY  1: Negative. Electronically Signed   By: Inocente Ast M.D.   On: 03/28/2024 12:50    Assessment & Plan:   Problem List Items Addressed This Visit     Anxiety disorder   Stress w/Eddie. Her close friend died 3 weeks ago      Relevant Medications   escitalopram (LEXAPRO) 10 MG tablet   B12 deficiency - Primary   On B12      HTN (hypertension)   Cont on losartan   and amlodipine       Memory impairment   Use electric toothbrush      Situational mixed anxiety and depressive disorder   Worse. Grieving Will try a low dose Lexapro Pt declined counseling       Relevant Medications   escitalopram (LEXAPRO) 10 MG tablet      Meds ordered this encounter  Medications   escitalopram (LEXAPRO) 10 MG tablet    Sig: Take 1 tablet (10 mg total) by mouth daily.    Dispense:  30 tablet    Refill:  5      Follow-up: Return in about 6 weeks (around 07/04/2024) for a follow-up visit.  Marolyn Noel, MD

## 2024-05-23 NOTE — Assessment & Plan Note (Signed)
 Stress w/Eddie. Her close friend died 3 weeks ago

## 2024-05-23 NOTE — Assessment & Plan Note (Addendum)
 Worse. Grieving Will try a low dose Lexapro Pt declined counseling

## 2024-05-24 ENCOUNTER — Ambulatory Visit

## 2024-05-28 ENCOUNTER — Ambulatory Visit (HOSPITAL_BASED_OUTPATIENT_CLINIC_OR_DEPARTMENT_OTHER): Admitting: Physical Therapy

## 2024-05-28 DIAGNOSIS — M25561 Pain in right knee: Secondary | ICD-10-CM | POA: Diagnosis not present

## 2024-05-28 DIAGNOSIS — M5459 Other low back pain: Secondary | ICD-10-CM

## 2024-05-28 DIAGNOSIS — R2689 Other abnormalities of gait and mobility: Secondary | ICD-10-CM

## 2024-05-28 DIAGNOSIS — Z9181 History of falling: Secondary | ICD-10-CM
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
## 2024-05-29 ENCOUNTER — Encounter (HOSPITAL_BASED_OUTPATIENT_CLINIC_OR_DEPARTMENT_OTHER): Payer: Self-pay | Admitting: Physical Therapy

## 2024-06-17 ENCOUNTER — Ambulatory Visit (HOSPITAL_BASED_OUTPATIENT_CLINIC_OR_DEPARTMENT_OTHER): Attending: Internal Medicine | Admitting: Physical Therapy

## 2024-06-17 ENCOUNTER — Encounter (HOSPITAL_BASED_OUTPATIENT_CLINIC_OR_DEPARTMENT_OTHER): Payer: Self-pay | Admitting: Physical Therapy

## 2024-06-17 DIAGNOSIS — R2689 Other abnormalities of gait and mobility: Secondary | ICD-10-CM | POA: Insufficient documentation

## 2024-06-17 DIAGNOSIS — M25562 Pain in left knee: Secondary | ICD-10-CM | POA: Diagnosis present

## 2024-06-17 DIAGNOSIS — Z9181 History of falling: Secondary | ICD-10-CM | POA: Diagnosis present

## 2024-06-17 DIAGNOSIS — M25561 Pain in right knee: Secondary | ICD-10-CM | POA: Diagnosis present

## 2024-06-17 DIAGNOSIS — M5459 Other low back pain: Secondary | ICD-10-CM | POA: Diagnosis present
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
## 2024-06-20 ENCOUNTER — Encounter (HOSPITAL_BASED_OUTPATIENT_CLINIC_OR_DEPARTMENT_OTHER): Payer: Self-pay | Admitting: Physical Therapy

## 2024-06-20 ENCOUNTER — Ambulatory Visit (HOSPITAL_BASED_OUTPATIENT_CLINIC_OR_DEPARTMENT_OTHER): Admitting: Physical Therapy

## 2024-06-20 DIAGNOSIS — M25561 Pain in right knee: Secondary | ICD-10-CM

## 2024-06-20 DIAGNOSIS — R2689 Other abnormalities of gait and mobility: Secondary | ICD-10-CM

## 2024-06-20 DIAGNOSIS — M5459 Other low back pain: Secondary | ICD-10-CM

## 2024-06-20 DIAGNOSIS — Z9181 History of falling: Secondary | ICD-10-CM
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
## 2024-06-24 ENCOUNTER — Ambulatory Visit (HOSPITAL_BASED_OUTPATIENT_CLINIC_OR_DEPARTMENT_OTHER): Admitting: Physical Therapy

## 2024-06-24 DIAGNOSIS — M5459 Other low back pain: Secondary | ICD-10-CM

## 2024-06-24 DIAGNOSIS — M25561 Pain in right knee: Secondary | ICD-10-CM | POA: Diagnosis not present

## 2024-06-24 DIAGNOSIS — R2689 Other abnormalities of gait and mobility: Secondary | ICD-10-CM

## 2024-06-24 DIAGNOSIS — Z9181 History of falling: Secondary | ICD-10-CM
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
## 2024-06-25 ENCOUNTER — Encounter (HOSPITAL_BASED_OUTPATIENT_CLINIC_OR_DEPARTMENT_OTHER): Payer: Self-pay | Admitting: Physical Therapy

## 2024-07-02 ENCOUNTER — Encounter (HOSPITAL_BASED_OUTPATIENT_CLINIC_OR_DEPARTMENT_OTHER): Payer: Self-pay | Admitting: Physical Therapy

## 2024-07-02 ENCOUNTER — Ambulatory Visit (HOSPITAL_BASED_OUTPATIENT_CLINIC_OR_DEPARTMENT_OTHER): Attending: Internal Medicine | Admitting: Physical Therapy

## 2024-07-02 DIAGNOSIS — R2689 Other abnormalities of gait and mobility: Secondary | ICD-10-CM | POA: Diagnosis present

## 2024-07-02 DIAGNOSIS — M25561 Pain in right knee: Secondary | ICD-10-CM | POA: Insufficient documentation

## 2024-07-02 DIAGNOSIS — Z9181 History of falling: Secondary | ICD-10-CM | POA: Insufficient documentation

## 2024-07-02 DIAGNOSIS — M5459 Other low back pain: Secondary | ICD-10-CM | POA: Diagnosis present

## 2024-07-02 DIAGNOSIS — M25562 Pain in left knee: Secondary | ICD-10-CM | POA: Diagnosis present
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
## 2024-07-04 ENCOUNTER — Ambulatory Visit: Admitting: Internal Medicine

## 2024-07-04 ENCOUNTER — Encounter: Payer: Self-pay | Admitting: Internal Medicine

## 2024-07-04 ENCOUNTER — Encounter (HOSPITAL_BASED_OUTPATIENT_CLINIC_OR_DEPARTMENT_OTHER): Admitting: Physical Therapy

## 2024-07-04 VITALS — BP 134/80 | HR 84 | Ht 64.0 in

## 2024-07-04 DIAGNOSIS — F413 Other mixed anxiety disorders: Secondary | ICD-10-CM

## 2024-07-04 DIAGNOSIS — E538 Deficiency of other specified B group vitamins: Secondary | ICD-10-CM | POA: Diagnosis not present

## 2024-07-04 DIAGNOSIS — G8929 Other chronic pain: Secondary | ICD-10-CM

## 2024-07-04 DIAGNOSIS — M5440 Lumbago with sciatica, unspecified side: Secondary | ICD-10-CM

## 2024-07-04 DIAGNOSIS — R413 Other amnesia: Secondary | ICD-10-CM

## 2024-07-04 MED ORDER — DONEPEZIL HCL 5 MG PO TABS: 5.0000 mg | ORAL_TABLET | Freq: Every day | ORAL | 5 refills | Status: AC

## 2024-07-04 NOTE — Assessment & Plan Note (Signed)
 Continue PT at at [facility] Diazepm prn spasms  Potential benefits of a long term benzodiazepines  use as well as potential risks  and complications were explained to the patient and were aknowledged. Tylenol  prn

## 2024-07-04 NOTE — Assessment & Plan Note (Signed)
 On B12

## 2024-07-04 NOTE — Progress Notes (Signed)
 Subjective:  Patient ID: [name], female    DOB: 05/13/1941  Age: 83 y.o. MRN: [id number]  CC: Medical Management of Chronic Issues (6 Week follow up)   HPI [name] presents for memory loss, situational depression,  HTN 2 friends died recently   Outpatient Medications Prior to Visit  Medication Sig Dispense Refill   acetaminophen  (TYLENOL ) 500 MG tablet Take 1,000 mg by mouth every 6 (six) hours as needed.     amLODipine  (NORVASC ) 5 MG tablet TAKE 1 TABLET BY MOUTH DAILY 90 tablet 3   amoxicillin  (AMOXIL ) 500 MG capsule Take 4 capsules 1 hour prior to a dental cleaning, dental work 20 capsule 1   Ascorbic Acid  (VITAMIN C  PO) Take by mouth daily.     b complex vitamins tablet Take 1 tablet by mouth daily. 100 tablet 3   Cholecalciferol (VITAMIN D3) 50 MCG (2000 UT) capsule Take 2 capsules (4,000 Units total) by mouth daily. 100 capsule 3   diazepam  (VALIUM ) 5 MG tablet TAKE 1 TABLET BY MOUTH EVERY 12 HOURS AS NEEDED FOR MUSCLE SPASMS OR ANXIETY. 30 tablet 3   escitalopram  (LEXAPRO ) 10 MG tablet Take 1 tablet (10 mg total) by mouth daily. 30 tablet 5   fexofenadine (ALLEGRA) 180 MG tablet Take 180 mg by mouth daily.     ibuprofen  (ADVIL ,MOTRIN ) 400 MG tablet Take 400 mg by mouth every 6 (six) hours as needed.     latanoprost (XALATAN) 0.005 % ophthalmic solution Apply to eye.     loratadine  (CLARITIN ) 10 MG tablet Take 1 tablet (10 mg total) by mouth daily. 30 tablet 11   losartan  (COZAAR ) 100 MG tablet TAKE 1 TABLET BY MOUTH DAILY 90 tablet 3   zolpidem  (AMBIEN ) 10 MG tablet TAKE 0.5 TO 1 TABLET BY MOUTH AT BEDTIME AS NEEDED FOR SLEEP 30 tablet 5   No facility-administered medications prior to visit.    ROS: Review of Systems  Constitutional:  Negative for activity change, appetite change, chills, fatigue and unexpected weight change.  HENT:  Negative for congestion, mouth sores and sinus pressure.   Eyes:  Negative for visual disturbance.   Respiratory:  Negative for cough and chest tightness.   Gastrointestinal:  Negative for abdominal pain and nausea.  Genitourinary:  Negative for difficulty urinating, frequency and vaginal pain.  Musculoskeletal:  Positive for back pain. Negative for gait problem.  Skin:  Negative for pallor and rash.  Neurological:  Negative for dizziness, tremors, weakness, numbness and headaches.  Psychiatric/Behavioral:  Positive for decreased concentration. Negative for confusion, sleep disturbance and suicidal ideas. The patient is not nervous/anxious.     Objective:  BP 134/80   Pulse 84   Ht 5' 4 (1.626 m)   SpO2 97%   BMI 30.73 kg/m   BP Readings from Last 3 Encounters:  07/04/24 134/80  05/23/24 132/76  03/28/24 (!) 157/65    Wt Readings from Last 3 Encounters:  05/23/24 179 lb (81.2 kg)  03/28/24 178 lb 8 oz (81 kg)  02/21/24 177 lb (80.3 kg)    Physical Exam Constitutional:      General: She is not in acute distress.    Appearance: She is well-developed. She is obese.  HENT:     Head: Normocephalic.     Right Ear: External ear normal.     Left Ear: External ear normal.     Nose: Nose normal.  Eyes:     General:        Right  eye: No discharge.        Left eye: No discharge.     Conjunctiva/sclera: Conjunctivae normal.     Pupils: Pupils are equal, round, and reactive to light.  Neck:     Thyroid : No thyromegaly.     Vascular: No JVD.     Trachea: No tracheal deviation.  Cardiovascular:     Rate and Rhythm: Normal rate and regular rhythm.     Heart sounds: Normal heart sounds.  Pulmonary:     Effort: No respiratory distress.     Breath sounds: No stridor. No wheezing.  Abdominal:     General: Bowel sounds are normal. There is no distension.     Palpations: Abdomen is soft. There is no mass.     Tenderness: There is no abdominal tenderness. There is no guarding or rebound.  Musculoskeletal:        General: No tenderness.     Cervical back: Normal range of motion  and neck supple. No rigidity.  Lymphadenopathy:     Cervical: No cervical adenopathy.  Skin:    Findings: No erythema or rash.  Neurological:     Cranial Nerves: No cranial nerve deficit.     Motor: No abnormal muscle tone.     Coordination: Coordination normal.     Deep Tendon Reflexes: Reflexes normal.  Psychiatric:        Behavior: Behavior normal.        Thought Content: Thought content normal.        Judgment: Judgment normal.     Lab Results  Component Value Date   WBC 9.3 11/22/2023   HGB 14.0 11/22/2023   HCT 41.6 11/22/2023   PLT 318.0 11/22/2023   GLUCOSE 93 11/22/2023   CHOL 231 (H) 11/22/2023   TRIG 173.0 (H) 11/22/2023   HDL 53.80 11/22/2023   LDLDIRECT 161.3 06/17/2011   LDLCALC 143 (H) 11/22/2023   ALT 13 11/22/2023   AST 16 11/22/2023   NA 138 11/22/2023   K 4.0 11/22/2023   CL 102 11/22/2023   CREATININE 0.79 11/22/2023   BUN 9 11/22/2023   CO2 29 11/22/2023   TSH 2.59 11/22/2023   INR 1.06 06/12/2018    MM 3D SCREENING MAMMOGRAM BILATERAL BREAST Result Date: 03/28/2024 CLINICAL DATA:  Screening. EXAM: DIGITAL SCREENING BILATERAL MAMMOGRAM WITH TOMOSYNTHESIS AND CAD TECHNIQUE: Bilateral screening digital craniocaudal and mediolateral oblique mammograms were obtained. Bilateral screening digital breast tomosynthesis was performed. The images were evaluated with computer-aided detection. COMPARISON:  Previous exam(s). ACR Breast Density Category b: There are scattered areas of fibroglandular density. FINDINGS: There are no findings suspicious for malignancy. IMPRESSION: No mammographic evidence of malignancy. A result letter of this screening mammogram will be mailed directly to the patient. RECOMMENDATION: Screening mammogram in one year. (Code:SM-B-01Y) BI-RADS CATEGORY  1: Negative. Electronically Signed   By: Inocente Ast M.D.   On: 03/28/2024 12:50    Assessment & Plan:   Problem List Items Addressed This Visit     Anxiety disorder   Stress  w/Eddie. Her 2 close friends died       B12 deficiency   On B12      Low back pain   Continue PT at at [facility] Diazepm prn spasms  Potential benefits of a long term benzodiazepines  use as well as potential risks  and complications were explained to the patient and were aknowledged. Tylenol  prn      Memory impairment - Primary   Use electric toothbrush Get  shingles vaccine - it is  is linked to a lower risk of dementia  Aricept was suggested - will start 10 mg at hs         Meds ordered this encounter  Medications   donepezil (ARICEPT) 5 MG tablet    Sig: Take 1 tablet (5 mg total) by mouth at bedtime.    Dispense:  30 tablet    Refill:  5      Follow-up: Return in about 3 months (around 10/02/2024).  Marolyn Noel, MD

## 2024-07-04 NOTE — Assessment & Plan Note (Addendum)
 Use electric toothbrush Get  shingles vaccine - it is  is linked to a lower risk of dementia  Aricept was suggested - will start 10 mg at hs

## 2024-07-04 NOTE — Assessment & Plan Note (Signed)
 Stress w/Eddie. Her 2 close friends died

## 2024-07-04 NOTE — Patient Instructions (Signed)
 The shingles vaccine is linked to a lower risk of dementia in recent studies, with some research showing a reduction of approximately 20% in dementia diagnoses for vaccinated individuals. Proposed reasons for this link include that preventing shingles (herpes zoster) may prevent the virus from causing inflammation in the brain, and some studies suggest the vaccine might also have a beneficial effect on the immune system

## 2024-07-09 ENCOUNTER — Ambulatory Visit (HOSPITAL_BASED_OUTPATIENT_CLINIC_OR_DEPARTMENT_OTHER): Admitting: Physical Therapy

## 2024-07-11 ENCOUNTER — Encounter (HOSPITAL_BASED_OUTPATIENT_CLINIC_OR_DEPARTMENT_OTHER): Payer: Self-pay | Admitting: Physical Therapy

## 2024-07-11 ENCOUNTER — Ambulatory Visit (HOSPITAL_BASED_OUTPATIENT_CLINIC_OR_DEPARTMENT_OTHER): Admitting: Physical Therapy

## 2024-07-11 DIAGNOSIS — R2689 Other abnormalities of gait and mobility: Secondary | ICD-10-CM

## 2024-07-11 DIAGNOSIS — M5459 Other low back pain: Secondary | ICD-10-CM

## 2024-07-11 DIAGNOSIS — M25561 Pain in right knee: Secondary | ICD-10-CM | POA: Diagnosis not present

## 2024-07-11 DIAGNOSIS — Z9181 History of falling: Secondary | ICD-10-CM
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
## 2024-07-12 ENCOUNTER — Ambulatory Visit

## 2024-07-16 ENCOUNTER — Encounter (HOSPITAL_BASED_OUTPATIENT_CLINIC_OR_DEPARTMENT_OTHER): Admitting: Physical Therapy

## 2024-07-17 ENCOUNTER — Telehealth: Payer: Self-pay | Admitting: Hematology and Oncology

## 2024-07-17 NOTE — Telephone Encounter (Signed)
 I spoke w the pt regarding 03/31/25 appt being rescheduled to 04/01/25. Patient is aware of new appt time and date.

## 2024-07-18 ENCOUNTER — Encounter (HOSPITAL_BASED_OUTPATIENT_CLINIC_OR_DEPARTMENT_OTHER): Admitting: Physical Therapy

## 2024-07-21 ENCOUNTER — Other Ambulatory Visit: Payer: Self-pay | Admitting: Internal Medicine

## 2024-07-21 DIAGNOSIS — F419 Anxiety disorder, unspecified: Secondary | ICD-10-CM

## 2024-07-23 ENCOUNTER — Encounter (HOSPITAL_BASED_OUTPATIENT_CLINIC_OR_DEPARTMENT_OTHER): Admitting: Physical Therapy

## 2024-08-30 ENCOUNTER — Ambulatory Visit

## 2024-09-02 ENCOUNTER — Other Ambulatory Visit: Payer: Self-pay | Admitting: Internal Medicine

## 2024-10-21 ENCOUNTER — Ambulatory Visit: Admitting: Internal Medicine

## 2025-03-31 ENCOUNTER — Ambulatory Visit: Admitting: Hematology and Oncology

## 2025-04-01 ENCOUNTER — Inpatient Hospital Stay: Admitting: Hematology and Oncology
